# Patient Record
Sex: Female | Born: 1955
Health system: Southern US, Community
[De-identification: ages and names within clinical notes are randomized; demographics above are authoritative.]

## PROBLEM LIST (undated history)

## (undated) DIAGNOSIS — G4730 Sleep apnea, unspecified: Secondary | ICD-10-CM

## (undated) DIAGNOSIS — R42 Dizziness and giddiness: Secondary | ICD-10-CM

## (undated) DIAGNOSIS — I10 Essential (primary) hypertension: Secondary | ICD-10-CM

## (undated) DIAGNOSIS — I2510 Atherosclerotic heart disease of native coronary artery without angina pectoris: Secondary | ICD-10-CM

## (undated) DIAGNOSIS — E119 Type 2 diabetes mellitus without complications: Secondary | ICD-10-CM

## (undated) DIAGNOSIS — M1990 Unspecified osteoarthritis, unspecified site: Secondary | ICD-10-CM

## (undated) DIAGNOSIS — E669 Obesity, unspecified: Secondary | ICD-10-CM

## (undated) DIAGNOSIS — G629 Polyneuropathy, unspecified: Secondary | ICD-10-CM

## (undated) DIAGNOSIS — M797 Fibromyalgia: Secondary | ICD-10-CM

## (undated) DIAGNOSIS — E785 Hyperlipidemia, unspecified: Secondary | ICD-10-CM

## (undated) HISTORY — DX: Sleep apnea, unspecified: G47.30

## (undated) HISTORY — DX: Unspecified osteoarthritis, unspecified site: M19.90

## (undated) HISTORY — DX: Obesity, unspecified: E66.9

## (undated) HISTORY — DX: Dizziness and giddiness: R42

## (undated) HISTORY — DX: Type 2 diabetes mellitus without complications: E11.9

## (undated) HISTORY — DX: Essential (primary) hypertension: I10

## (undated) HISTORY — DX: Polyneuropathy, unspecified: G62.9

## (undated) HISTORY — DX: Hyperlipidemia, unspecified: E78.5

## (undated) HISTORY — DX: Fibromyalgia: M79.7

## (undated) HISTORY — DX: Atherosclerotic heart disease of native coronary artery without angina pectoris: I25.10

---

## 1994-10-12 HISTORY — PX: CHOLECYSTECTOMY: SHX55

## 2004-11-12 HISTORY — PX: CORONARY ANGIOPLASTY WITH STENT PLACEMENT: SHX49

## 2004-11-12 HISTORY — PX: CORONARY STENT PLACEMENT: SHX1402

## 2004-11-25 ENCOUNTER — Ambulatory Visit: Payer: Self-pay | Admitting: Internal Medicine

## 2004-12-02 ENCOUNTER — Ambulatory Visit: Payer: Self-pay

## 2004-12-04 ENCOUNTER — Ambulatory Visit: Payer: Self-pay | Admitting: Internal Medicine

## 2004-12-04 ENCOUNTER — Inpatient Hospital Stay (HOSPITAL_COMMUNITY): Admission: AD | Admit: 2004-12-04 | Discharge: 2004-12-09 | Payer: Self-pay | Admitting: Internal Medicine

## 2004-12-15 ENCOUNTER — Ambulatory Visit: Payer: Self-pay | Admitting: Internal Medicine

## 2004-12-23 ENCOUNTER — Ambulatory Visit: Payer: Self-pay | Admitting: Internal Medicine

## 2005-01-19 ENCOUNTER — Ambulatory Visit: Payer: Self-pay | Admitting: Internal Medicine

## 2005-05-07 ENCOUNTER — Ambulatory Visit: Payer: Self-pay | Admitting: Internal Medicine

## 2005-05-20 ENCOUNTER — Ambulatory Visit: Payer: Self-pay | Admitting: Internal Medicine

## 2005-10-15 ENCOUNTER — Ambulatory Visit: Payer: Self-pay | Admitting: Internal Medicine

## 2005-10-20 ENCOUNTER — Ambulatory Visit: Payer: Self-pay

## 2005-10-28 ENCOUNTER — Ambulatory Visit: Payer: Self-pay | Admitting: Pulmonary Disease

## 2005-11-13 ENCOUNTER — Ambulatory Visit (HOSPITAL_BASED_OUTPATIENT_CLINIC_OR_DEPARTMENT_OTHER): Admission: RE | Admit: 2005-11-13 | Discharge: 2005-11-13 | Payer: Self-pay | Admitting: Pulmonary Disease

## 2005-11-24 ENCOUNTER — Ambulatory Visit: Payer: Self-pay | Admitting: Pulmonary Disease

## 2005-12-31 ENCOUNTER — Encounter (HOSPITAL_COMMUNITY): Admission: RE | Admit: 2005-12-31 | Discharge: 2006-03-31 | Payer: Self-pay | Admitting: Internal Medicine

## 2006-01-12 ENCOUNTER — Ambulatory Visit: Payer: Self-pay | Admitting: Internal Medicine

## 2006-02-04 ENCOUNTER — Ambulatory Visit: Payer: Self-pay | Admitting: Pulmonary Disease

## 2006-05-04 ENCOUNTER — Ambulatory Visit: Payer: Self-pay

## 2006-05-21 ENCOUNTER — Ambulatory Visit: Payer: Self-pay | Admitting: Gastroenterology

## 2006-06-14 ENCOUNTER — Emergency Department: Payer: Self-pay | Admitting: Unknown Physician Specialty

## 2006-06-23 ENCOUNTER — Ambulatory Visit: Payer: Self-pay | Admitting: Gastroenterology

## 2006-07-13 ENCOUNTER — Ambulatory Visit: Payer: Self-pay | Admitting: Internal Medicine

## 2007-05-12 ENCOUNTER — Ambulatory Visit: Payer: Self-pay | Admitting: Internal Medicine

## 2007-12-02 ENCOUNTER — Ambulatory Visit: Payer: Self-pay | Admitting: Cardiology

## 2007-12-02 ENCOUNTER — Emergency Department (HOSPITAL_COMMUNITY): Admission: EM | Admit: 2007-12-02 | Discharge: 2007-12-02 | Payer: Self-pay | Admitting: Emergency Medicine

## 2008-05-10 ENCOUNTER — Ambulatory Visit: Payer: Self-pay | Admitting: Internal Medicine

## 2009-01-14 ENCOUNTER — Encounter: Admission: RE | Admit: 2009-01-14 | Discharge: 2009-02-19 | Payer: Self-pay

## 2009-07-31 ENCOUNTER — Ambulatory Visit: Payer: Self-pay | Admitting: Internal Medicine

## 2009-07-31 DIAGNOSIS — I10 Essential (primary) hypertension: Secondary | ICD-10-CM | POA: Insufficient documentation

## 2009-07-31 DIAGNOSIS — E785 Hyperlipidemia, unspecified: Secondary | ICD-10-CM | POA: Insufficient documentation

## 2009-07-31 DIAGNOSIS — I2510 Atherosclerotic heart disease of native coronary artery without angina pectoris: Secondary | ICD-10-CM | POA: Insufficient documentation

## 2009-09-16 ENCOUNTER — Telehealth: Payer: Self-pay | Admitting: Internal Medicine

## 2009-09-17 ENCOUNTER — Encounter: Payer: Self-pay | Admitting: Internal Medicine

## 2009-12-19 ENCOUNTER — Telehealth: Payer: Self-pay | Admitting: Internal Medicine

## 2010-07-15 ENCOUNTER — Ambulatory Visit: Payer: Self-pay | Admitting: Internal Medicine

## 2010-07-17 ENCOUNTER — Telehealth: Payer: Self-pay | Admitting: Internal Medicine

## 2010-08-05 ENCOUNTER — Telehealth: Payer: Self-pay | Admitting: Internal Medicine

## 2010-10-30 ENCOUNTER — Telehealth: Payer: Self-pay | Admitting: Internal Medicine

## 2010-11-11 NOTE — Progress Notes (Signed)
Summary: refill**NEW PHARMACY**   Phone Note Refill Request Message from:  Patient on December 19, 2009 1:38 PM  Refills Requested: Medication #1:  PLAVIX 75 MG TABS Take one tablet by mouth daily   Supply Requested: 3 months  Medication #2:  PRAVASTATIN SODIUM 40 MG TABS Take one tablet by mouth daily at bedtime   Supply Requested: 3 months [facility] Fax [phone]   Method Requested: Fax to Mail Away Pharmacy Initial call taken by: Migdalia Dk,  December 19, 2009 1:39 PM    Prescriptions: PRAVASTATIN SODIUM 40 MG TABS (PRAVASTATIN SODIUM) Take one tablet by mouth daily at bedtime  #90 x 3   Entered by:   Kem Parkinson   Authorized by:   Dolores Patty, MD, [facility]   Signed by:   Kem Parkinson on 12/19/2009   Method used:   Electronically to        [facility]* (mail-order)             ,          Ph: [phone]       Fax: [phone]   RxID:   [id number] PLAVIX 75 MG TABS (CLOPIDOGREL BISULFATE) Take one tablet by mouth daily  #90 x 3   Entered by:   Kem Parkinson   Authorized by:   Dolores Patty, MD, [facility]   Signed by:   Kem Parkinson on 12/19/2009   Method used:   Electronically to        [facility]* (mail-order)             ,          Ph: [phone]       Fax: [phone]   RxID:   [id number]

## 2010-11-11 NOTE — Progress Notes (Signed)
Summary: sample of plavix   Phone Note Call from Patient Call back at Home Phone [phone] Call back at Work Phone [phone] Call back at lunch from 1-2- p.m   Caller: Patient Reason for Call: Talk to Nurse Summary of Call: sample of plavix 75 mg. Initial call taken by: Lorne Skeens,  July 17, 2010 12:30 PM  Follow-up for Phone Call        07/17/10--1320pm--pt calling for samples of plavix--phoned pt back at work number--no answer--left message stating plavix samples at front desk to pic up--nt Follow-up by: Ledon Snare, RN,  July 17, 2010 1:21 PM

## 2010-11-11 NOTE — Progress Notes (Signed)
Summary: med question   Phone Note Call from Patient Call back at Work Phone [phone]   Caller: Patient Summary of Call: Question about medication Initial call taken by: Judie Grieve,  August 05, 2010 2:25 PM  Follow-up for Phone Call        Left message to call back Meredith Staggers, RN  August 05, 2010 3:32 PM   spoke w/pt Meredith Staggers, RN  August 06, 2010 10:37 AM

## 2010-11-11 NOTE — Progress Notes (Signed)
Summary: samples plavix   Phone Note Call from Patient Call back at Work Phone [phone]   Caller: Patient Reason for Call: Talk to Nurse Summary of Call: pt needs samples of Plavix until hers arrive through mail order Initial call taken by: Migdalia Dk,  December 19, 2009 1:40 PM  Follow-up for Phone Call        got samples called and left message saying she can come pick up samples Follow-up by: Kem Parkinson,  December 19, 2009 2:07 PM

## 2010-11-11 NOTE — Assessment & Plan Note (Signed)
Summary: f1y  Medications Added NOVOLOG MIX 70/30 70-30 % SUSP (INSULIN ASPART PROT & ASPART) as directed ZYRTEC ALLERGY 10 MG CAPS (CETIRIZINE HCL) as needed OXYCODONE-ACETAMINOPHEN 5-325 MG TABS (OXYCODONE-ACETAMINOPHEN) as needed METAXALONE 800 MG TABS (METAXALONE) as needed FISH OIL   OIL (FISH OIL) 1400mg  two times a day * BLADDERURACK 580mg  two times a day * CURBITA BLADDER CAPS three times a day * CIRCULATION BLEND once daily FLECTOR 1.3 % PTCH (DICLOFENAC EPOLAMINE) as needed      Allergies Added: NKDA  Visit Type:  Follow-up Primary Francies Inch:  Herb Grays MD   History of Present Illness: Ms. Keel is a 55 year old woman with a history of coronary artery disease, status PTCA and stenting of an LAD lesion in February 2006 with Cypher drug-eluting stent.  She had a Myoview in January 2007 for recurrent chest pain and showed an EF of 58% with normal perfusion.  The rest of the medical history is notable for morbid obesity, diabetes, hypertension, hyperlipidemia, and sleep apnea. She has been intolerant of STATINS due to myalgias.   She returns today for yearly followup. She is doing very well. Her functional capacity is limited mostly by arthritis in her back. Blood sugar control improving - following with Dr. Lisabeth Devoid. Tolerating pravastatin at 40mg  every other day also taking fish oil. No CP or signficant dyspnea.    Current Medications (verified): 1)  Plavix 75 Mg Tabs (Clopidogrel Bisulfate) .... Take One Tablet By Mouth Daily 2)  Novolog Mix 70/30 70-30 % Susp (Insulin Aspart Prot & Aspart) .... As Directed 3)  Pravastatin Sodium 40 Mg Tabs (Pravastatin Sodium) .... Take One Tablet By Mouth Daily At Bedtime 4)  Ramipril 5 Mg Caps (Ramipril) .... Take One Capsule By Mouth Daily 5)  Metoclopramide Hcl 10 Mg Tabs (Metoclopramide Hcl) .... Uad 6)  Metformin Hcl 500 Mg Tabs (Metformin Hcl) .... 2 Tablets Two Times A Day 7)  Gabapentin 300 Mg Caps (Gabapentin) .... Take  One Tablet By Mouth Once Daily. 8)  Aspirin 81 Mg Tbec (Aspirin) .... Take One Tablet By Mouth Daily 9)  Osteo Bi-Flex Joint Shield  Tabs (Misc Natural Products) .... 2 Tabs Two Times A Day 10)  Magnesium Oxide 250 Mg Tabs (Magnesium Oxide) .... Take One Tablet By Mouth Once Daily. 11)  Beta Carotene 08657 Unit Caps (Beta Carotene) .... Take One Tablet By Mouth Once Daily. 12)  Ra Calcium Plus Vitamin D 600-200 Mg-Unit Tabs (Calcium Carbonate-Vitamin D) .... Take One Tablet By Mouth Twice Daily. 13)  Biotin Maximum Strength 5000 Mcg Caps (Biotin) .... Take One Tablet By Mouth Once Daily. 14)  Zyrtec Allergy 10 Mg Caps (Cetirizine Hcl) .... As Needed 15)  Oxycodone-Acetaminophen 5-325 Mg Tabs (Oxycodone-Acetaminophen) .... As Needed 16)  Metaxalone 800 Mg Tabs (Metaxalone) .... As Needed 17)  Fish Oil   Oil (Fish Oil) .... 1400mg  Two Times A Day 18)  Bladderurack .... 580mg  Two Times A Day 19)  Curbita Bladder Caps .... Three Times A Day 20)  Circulation Blend .... Once Daily 21)  Flector 1.3 % Ptch (Diclofenac Epolamine) .... As Needed  Allergies (verified): No Known Drug Allergies  Past History:  Past Medical History: Last updated: 07/31/2009 CAD   --s/p Cypher DES to LAD 2006 HTN HL with myalgias on statins DM2 Osteoarthritis Obesity Sleep apnea  Family History: Last updated: 07/30/2009 Family History of Cancer:  Family History of Coronary Artery Disease:  Family History of Diabetes:   Social History: Last updated: 07/30/2009 Divorced  Tobacco  Use - No.  Alcohol Use - no Drug Use - no  Risk Factors: Smoking Status: never (07/30/2009)  Review of Systems       As per HPI and past medical history; otherwise all systems negative.   Vital Signs:  Patient profile:   55 year old female Height:      67 inches Weight:      242 pounds BMI:     38.04 Pulse rate:   97 / minute BP sitting:   138 / 78  (left arm) Cuff size:   large  Vitals Entered By: Hardin Negus, RMA (July 15, 2010 12:16 PM)  Physical Exam  General:  Well appearing. no resp difficulty HEENT: normal Neck: supple. no JVD. Carotids 2+ bilat; no bruits. No lymphadenopathy or thryomegaly appreciated. Cor: PMI nondisplaced. Regular rate & rhythm. No rubs, gallops, murmur. Lungs: clear Abdomen: obese. soft, nontender, nondistended. Extremities: no cyanosis, clubbing, rash. tredema Neuro: alert & orientedx3, cranial nerves grossly intact. moves all 4 extremities w/o difficulty. affect pleasant    Impression & Recommendations:  Problem # 1:  CAD, NATIVE VESSEL (ICD-414.01) Stable. No evidence of ischemia. Continue current regimen. Given DM2, will consider f/u stress test next year.    Problem # 2:  HYPERLIPIDEMIA-MIXED (ICD-272.4) Followed by Dr. Collins Scotland. Push pravastatin as tolerated. Goal LDL < 70.   Other Orders: EKG w/ Interpretation (93000)  Patient Instructions: 1)  Your physician recommends that you schedule a follow-up appointment in: 1 year 2)  Will have a Stress test when you come back next year  Prevention & Chronic Care Immunizations   Influenza vaccine: Not documented    Tetanus booster: Not documented    Pneumococcal vaccine: Not documented  Colorectal Screening   Hemoccult: Not documented    Colonoscopy: Not documented  Other Screening   Pap smear: Not documented    Mammogram: Not documented   Smoking status: never  (07/30/2009)  Lipids   Total Cholesterol: Not documented   LDL: Not documented   LDL Direct: Not documented   HDL: Not documented   Triglycerides: Not documented    SGOT (AST): Not documented   SGPT (ALT): Not documented   Alkaline phosphatase: Not documented   Total bilirubin: Not documented  Hypertension   Last Blood Pressure: 138 / 78  (07/15/2010)   Serum creatinine: Not documented   Serum potassium Not documented  Self-Management Support :    Hypertension self-management support: Not documented    Lipid  self-management support: Not documented

## 2010-11-13 NOTE — Progress Notes (Signed)
Summary: samples of plavix***pt rtn call***   Phone Note Call from Patient Call back at Work Phone [phone]   Caller: [phone] Reason for Call: Talk to Nurse Details for Reason: samples of plavix.  Initial call taken by: Lorne Skeens,  October 30, 2010 12:20 PM  Follow-up for Phone Call        sent new script into [facility] per fax that came in, called Pt LMTCB   Hardin Negus, RMA  October 31, 2010 12:06 PM   Additional Follow-up for Phone Call Additional follow up Details #1::        pt rtn your call Omer Jack  November 03, 2010 9:30 AM   spoke with pt she needed 10 pills to get her thru so i sent in a #10 script for it to [facility]  Additional Follow-up by: Hardin Negus, RMA,  November 03, 2010 9:44 AM    Prescriptions: PLAVIX 75 MG TABS (CLOPIDOGREL BISULFATE) Take one tablet by mouth daily  #10 x 1   Entered by:   Hardin Negus, RMA   Authorized by:   Dolores Patty, MD, [facility]   Signed by:   Hardin Negus, RMA on 11/03/2010   Method used:   Electronically to        [facility]* (retail)       [address] [place], [place]  [address]       Ph: [phone] or [phone]       Fax: [phone]   RxID:   [id number] PLAVIX 75 MG TABS (CLOPIDOGREL BISULFATE) Take one tablet by mouth daily  #90 x 3   Entered by:   Hardin Negus, RMA   Authorized by:   Dolores Patty, MD, [facility]   Signed by:   Hardin Negus, RMA on 10/31/2010   Method used:   Electronically to        [facility]* (mail-order)       [address] [place], [place]  [address]       Ph: [phone]       Fax: [phone]   RxID:   [id number]

## 2011-02-24 ENCOUNTER — Other Ambulatory Visit: Payer: Self-pay | Admitting: Internal Medicine

## 2011-02-24 NOTE — Assessment & Plan Note (Signed)
[facility]                            CARDIOLOGY OFFICE NOTE   NAME:Brandenberger, MATTHEW PAIS                 MRN:          [id number]  DATE:05/10/2008                            DOB:          May 30, 1956    PRIMARY CARE PHYSICIAN:  Tammy R. Collins Scotland, MD   INTERVAL HISTORY:  Ms. Scarlett is a 55 year old woman with a  history of coronary artery disease, status PTCA and stenting of an LAD  lesion in February 2006 with Cypher drug-eluting stent.  She had a  Myoview in January 2007 for recurrent chest pain and showed an EF of 58%  with normal perfusion.  The rest of the medical history is notable for  morbid obesity, diabetes, hypertension, hyperlipidemia, and sleep apnea.  She has been intolerant of STATINS due to myalgias.   She returns today for routine followup.  Overall, she is doing much  better.  She denies any chest pain or shortness of breath.  Her  functional capacity is limited mostly by arthritis in both knees and her  back.  She has been started on NSAID patches.  Dr. Collins Scotland has started her  on WelChol for her hyperlipidemia.  Given her STATIN intolerance, but  she is having trouble with this given the dosing.  Her daughter is  moving from [place] to live with her as her daughter is now 8 months  pregnant.   MEDICATIONS:  1. Plavix 75 a day.  2. Potassium 20 b.i.d., which she will stop.  3. Noni.  4. Osteo Bi-Flex.  5. Calcium  6. Vitamin D.  7. WelChol 625 three tablets b.i.d.  8. Glipizide XL 10 b.i.d.  9. Metoclopramide 10 b.i.d.  10.Ramipril 5 a day.  11.Januvia 100 a day.  12.Magnesium 250 a day.  13.Beta-carotene.  14.Biotin.   PHYSICAL EXAMINATION:  GENERAL:  She is well appearing, in no acute  distress, ambulatory in the clinic without respiratory difficulty.  VITAL SIGNS:  Blood pressure is 126/82, her weight is 246, which is  stable, and heart rate is 76.  HEENT:  Normal.  NECK:  Supple.  No JVD.  Carotids are 2+  bilaterally without bruits.  There is no lymphadenopathy or thyromegaly.  CARDIAC:  PMI is nondisplaced.  Regular rate and rhythm with no murmurs,  rubs, or gallops.  LUNGS:  Clear.  ABDOMEN:  Obese, nontender, and nondistended.  No obvious  hepatosplenomegaly.  No bruits.  No masses.  Good bowel sounds.  EXTREMITIES:  Warm with no cyanosis, clubbing, or edema.  NEURO:  Alert and oriented x3.  Cranial nerves II through XII intact.  Moves all 4 extremities without difficulty.  Affect is normal.   EKG shows sinus rhythm at rate of 76 with no ST-T wave abnormalities.   ASSESSMENT/PLAN:  1. Coronary artery disease, this is stable.  No evidence of ischemia.      Continue current therapy.  2. Hypertension.  Blood pressure is well controlled.  3. Hyperlipidemia.  Given her diabetes and coronary artery disease, I      really like to see her on a statin and she is having trouble with  the WelChol both financially and with the dosing, thus, we will try      and stop this and see if she can tolerate pravastatin 20 mg a day.      She will follow up with Dr. Collins Scotland.  4. Lower extremity edema.  She stopped her Lasix due to problems with      incontinence.  Her edema has been well controlled.  I told her that      she can just take this p.r.n.   DISPOSITION:  We will see her back in 1 year for routine followup and  she knows to call me sooner, if she is having any problems.  She will  follow up her cholesterol with Dr. Collins Scotland.     Bevelyn Buckles. Bensimhon, MD  Electronically Signed    DRB/MedQ  DD: 05/10/2008  DT: 05/11/2008  Job #: [id number]   cc:   Tammy R. Collins Scotland, M.D.

## 2011-02-24 NOTE — Consult Note (Signed)
NAMECEILIDH, TORREGROSSA          ACCOUNT NO.:  [id number]   MEDICAL RECORD NO.:  [id number]          PATIENT TYPE:  EMS   LOCATION:  [facility]                         FACILITY:  [facility]   PHYSICIAN:  Thomas C. Wall, MD, FACCDATE OF BIRTH:  05/04/1956   DATE OF CONSULTATION:  12/02/2007  DATE OF DISCHARGE:                                 CONSULTATION   REFERRING PHYSICIAN:  Dr. Celene Kras.   PRIMARY CARDIOLOGIST:  Dr. Nicholes Mango.   PRIMARY CARE:  Dr. Herb Grays.   PULMONARY:  Dr. Coralyn Helling.   REASON FOR CONSULTATION:  Betty [name] is a 55 year old Caucasian  female with known history of coronary artery disease, who presents to  [facility] Emergency Room on this date complaining of chest discomfort,  a rather poor historian.  From what I can gather with her, chest pain  has been going on for about 2 weeks.  Apparently, immediately prior to  the onset of her chest discomfort, she and her son and just moved into a  new house.  She had been lifting and moving some boxes.  She states she  does not recall any specific injuries.  She also had been lying on the  floor doing some lining in her bathroom cabinets under the sink and  could not get up off the floor secondary to her arthritis.  She  remembers her son pulling her up off the floor using her left arm, which  is where her complaint is.  She describes it as an ache over the last 2  weeks, no associated symptoms, chest pain very reproducible to  palpation; she states it was worse last night when she was getting ready  for bed, unable to lie on her left side.  She rates it a 4 on a scale of  1-10.  This morning, she was at work and had some chest discomfort again  that concerned her and she decided to come in and get evaluated  secondary to her history.   PAST MEDICAL HISTORY:  1. Coronary disease, status post catheterization with Cypher drug-      eluting stent to the LAD in 2006, also with a PTCA to the first      diagonal  at that time, followup stress Myoview in 2007 showing no      ischemia, EF had improved from 45% to 58%.  2. Morbid obesity.  3. Hypertension.  4. Diabetes.  5. Obstructive sleep apnea with noncompliance to recommended CPAP.  6. Medical noncompliance with medications, diet, exercise.  7. C-section.  8. Status post cholecystectomy.  9. Cervical disease.   ALLERGIES:  Include intolerance to LIPITOR.   MEDICATIONS:  1. Plavix 75.  2. Glucotrol XL; she takes 10 mg in the morning, 5 mg in the evening.  3. Januvia daily, unclear of dose.  4. Altace 2.5 daily.  5. Lasix; last note from Dr. Gala Romney states Lasix 40 mg b.i.d.; the      patient states she takes it once a day or as needed.  6. Aspirin 81 mg daily.  The patient states she stopped this because  it makes her bruise.  7. Multivitamin daily.  8. Reglan 5 mg q.i.d.  9. Calcium 20 mEq daily.  10.Beta carotene daily.  11.Magnesium daily,  12.Folic acid daily.  13.Glucosamine and chondroitin daily.  14.Calcium daily.  15.Vitamin D daily.   SOCIAL HISTORY:  [place].  She  is divorced, originally from [place].  No exercise.  No drugs or herbal  medications or alcohol use.  Never used any tobacco.  Tries to follow a  diabetic diet.   FAMILY HISTORY:  Mother deceased; she had a history of diabetes,  coronary artery disease and uterine cancer; she was 64.  Father is  alive; he has a history of hypertension, CVA and bypass.  Siblings all  with coronary artery disease/MIs.   REVIEW OF SYSTEMS:  Positive for headaches, chest pain, dyspnea on  exertion, lower extremity edema, myalgia, arthralgias, chronic pain and  GERD symptoms.  All other systems reviewed and negative.   PHYSICAL EXAMINATION:  VITAL SIGNS:  Temperature 98, pulse 96,  respirations 18, blood pressure initially 160/72, currently 118/70,  saturation 95% on room air.  GENERAL:  In no acute distress.  HEENT: Normal.   NECK: Supple without lymphadenopathy, no bruits, no JVD.  CARDIOVASCULAR:  Exam reveals S1 and S2, a 2/6 systolic ejection murmur  noted.  LUNGS:  Clear to auscultation bilaterally.  ABDOMEN:  Obese, soft, nontender.  Positive bowel sounds.  LOWER EXTREMITIES: Without clubbing or cyanosis.  She has trace of edema  bilaterally.  NEUROLOGICAL:  Alert and oriented x3.   LABORATORY AND ACCESSORY CLINICAL DATA:  Chest x-ray showing no acute  findings.   EKG:  Rate 96. sinus rhythm with mild T-wave inversions in inferior  leads.   H&H 13.3 and 39.3.  Sodium 136, potassium 4.2, chloride 102, BUN 10,  creatinine 0.8, glucose 232.  Point-of-cares are negative x1 set.   IMPRESSION:  1. Chest pain, reproducible to palpation.  2. History of coronary artery disease.  3. Medical noncompliance.  4. Obstructive sleep apnea with noncompliance.  5. Diabetes.  6. Hypertension.   Dr. Juanito Doom in to examine and assess patient.  The patient would like  to be discharged home, Dr. Daleen Squibb agreeable, as pain is felt to be  musculoskeletal.  The patient is recommended to use moist heat chest.  Follow up with Dr. Collins Scotland.  Celebrex 200 mg b.i.d. for 7 days and follow  up with Dr. Collins Scotland for further evaluation if needed.  Patient instructed  to take Celebrex with food on stomach.      Betty [name], ACNP      Jesse Sans. Daleen Squibb, MD, [facility]  Electronically Signed    MB/MEDQ  D:  12/02/2007  T:  12/04/2007  Job:  [id number]   cc:   Tammy R. Collins Scotland, M.D.

## 2011-02-24 NOTE — Assessment & Plan Note (Signed)
[facility]                            CARDIOLOGY OFFICE NOTE   NAME:Betty [name], Betty [name]                 MRN:          [id number]  DATE:05/12/2007                            DOB:          1955/11/09    PRIMARY CARE PHYSICIAN:  Dr. Herb Grays.   INTERVAL HISTORY:  Betty [name] is a 55 year old woman with a  history of coronary artery disease status post PCA and stenting of an  LAD lesion in February of 2006, with a CYPHER drug-eluting stent.  She  did have a Myoview in January of 2007 for recurrent chest pain which  showed an EF of 58% and normal perfusion.  The rest of her medical  history is notable for morbid obesity, diabetes, hypertension,  hyperlipidemia and sleep apnea.  She is intolerant of statins due to  severe leg pains and refuses to take them.   She returns today for routine followup.  Overall, she is doing much  better, she denies any chest pain or shortness of breath; however, her  functional capacity is limited by arthritis pain in both knees.  She has  been following with Dr. Collins Scotland and been very comfortable with her.  She  does have occasional edema, which she treats with Lasix.   PHYSICAL EXAM:  She is well-appearing, in no acute distress.  Ambulates  around the clinic without any respiratory difficulty.  Blood pressure is  126/72, heart rate is 88, weight is 249.  HEENT:  Normal.  NECK:  Supple.  No JVD.  Carotids are 2+ bilaterally without any bruits.  There is no lymphadenopathy or thyromegaly.  CARDIAC:  Regular rate and rhythm with a 2/6 systolic ejection murmur at  the left sternal border, no rubs or gallops.  LUNGS:  Clear.  ABDOMEN:  Obese, nontender, nondistended.  There is no  hepatosplenomegaly, no bruits, no masses, good bowel sounds.  EXTREMITIES:  Warm with no cyanosis, clubbing or edema, no rash.  Edema  bilaterally 2+.  NEURO:  She is alert and oriented x3.  Cranial nerves II-XII are intact.  She moves all  4 extremities without difficulty.  Affect is appropriate.   EKG shows normal sinus rhythm at a rate of 88, no significant ST-T wave  abnormalities.   ASSESSMENT:  1. Coronary artery disease; this is stable, no evidence of ischemia.      Continue Plavix lifelong.  2. Hypertension, at goal.  3. Hyperlipidemia, this is followed by Dr. Collins Scotland; I encouraged her to      try another statin, given her coronary artery disease, she      completely refuses this.  Thus, we will consider Zetia to try to      help and get her LDL below 70.  I will leave this to Dr. Collins Scotland.  4. Lower extremity edema.  She can be managed with Lasix.  She does      have mild volume overload today and she attributes this to missing      her Lasix this morning.  She will take her night time dose.   DISPOSITION:  Will see her back in clinic in  9 months for routine  followup.     Bevelyn Buckles. Bensimhon, MD  Electronically Signed    DRB/MedQ  DD: 05/12/2007  DT: 05/13/2007  Job #: [id number]   cc:   Tammy R. Collins Scotland, M.D.

## 2011-02-27 NOTE — Assessment & Plan Note (Signed)
[facility]                           GASTROENTEROLOGY OFFICE NOTE   NAME:Betty, [name]                 MRN:          [id number]  DATE:05/21/2006                            DOB:          30-Apr-1956    HISTORY:  [name] is a 55 year old white female who is referred by  Dr. Bayard Beaver. Spear for a screening colonoscopy.  This patient has had some  painful hemorrhoidal swelling but denies rectal bleeding or significant  abdominal pain.  She does have periodic constipation.  She denies any upper  GI or upper biliary complaints.  Her appetite is good and she is actually  gaining weight steadily, having gained 30 pounds in the last several months.  She denies melena or hematochezia.  She has never had a colonoscopy or a  barium enema exams.  She denies food intolerances and specifically any  dysphagia, dyspepsia, etc.   PAST MEDICAL/SURGICAL HISTORY:  1. Remarkable for morbid obesity.  2. Adult onset diabetes.  3. Coronary artery disease with previous angioplasty and stenting.  4. Cervical disease at C5-C6.  5. She is status post cholecystectomy for cholelithiasis.  6. Chronic thyroid dysfunction.  7. Degenerative arthritis.  8. Sleep apnea.  9. Chronic sinusitis.   MEDICATIONS:  1. Plavix 75 mg daily.  2. Potassium 20 mEq b.i.d.  3. Altace 2.5 mg. q.o.d.  4. Lasix 40 mg b.i.d.  5. Aspirin 81 mg daily.  6. Glucotrol 5 mg daily.  7. Lipitor 40 mg q.o.d.  8. __________ 400 mg daily.  9. A variety of vitamins and minerals including folic acid.  10.Nitroglycerin p.r.n.   ALLERGIES:  She denies drug allergies.   FAMILY HISTORY:  Remarkable for diabetes and atherosclerosis in multiple  family members.  She has a grandmother with breast cancer and her mother had  uterine cancer.  Her father suffered from chronic polyposis and is age 56.   SOCIAL HISTORY:  The patient is divorced and lives alone.  She has a high  school education.   She used to work as a Librarian, academic.  She does not  smoke or abuse ethanol.   REVIEW OF SYSTEMS:  Remarkable for multiarticular arthralgias, chronic  insomnia, excessive urination, painful menses, chronic fatigue and some  urinary incontinency.  Her last menstrual period was on May 18, 2006.  She  denies current active cardiovascular, pulmonary, urologic or psychiatric  difficulties.   PHYSICAL EXAMINATION:  GENERAL:  A morbidly obese white female, in no acute  distress.  She is 5 feet 7 inches tall.  She weighs 257.4 pounds.  I could  not appreciate stigmata of chronic liver disease.  VITAL SIGNS:  Blood pressure 134/68, pulse 84 and regular.  NECK:  I could not appreciate thyromegaly.  CHEST:  Generally clear without wheezes or rhonchi.  HEART:  She appeared to be in a regular rhythm and I could not appreciate  significant murmurs, gallops or rubs.  ABDOMEN:  Difficult because of obesity but I could appreciate no definite  organomegaly, masses or tenderness.  EXTREMITIES:  Peripheral extremities showed no significant edema or  phlebitis.  NEUROLOGIC:  Mental  status was clear.  RECTAL:  Deferred.   ASSESSMENT/RECOMMENDATIONS:  We will schedule [name] for a  screening colonoscopy at her convenience with adjustment in her diabetic  medications.  Because of her stents and cardiovascular history, she will  need to continue her Plavix and aspirin during this procedure.  The risks  and benefits of this procedure with possible polypectomy, including bleeding  and perforation, have been explained in detail and she has agreed to proceed  as planned.  She is to continue her other medications as per Dr. Collins Scotland.   ADDENDUM:  The patient did have a recent urinalysis and exam, also which  were unremarkable.  Recent laboratory tests per Dr. Collins Scotland have shown a  normal metabolic profile including liver function tests and a hemoglobin A1c  of 6.1% on April 03, 2006.  The TSH was also  normal.                                   Betty [name]. Jarold Motto, MD, [facility] [place], [place]   DRP/MedQ  DD:  05/21/2006  DT:  05/21/2006  Job #:  [id number]   cc:   Bevelyn Buckles. Bensimhon, MD  Tammy R. Collins Scotland, MD

## 2011-02-27 NOTE — Cardiovascular Report (Signed)
NAMETEEA, DUCEY NO.:  [id number]   MEDICAL RECORD NO.:  [id number]          PATIENT TYPE:  INP   LOCATION:  4742                         FACILITY:  [facility]   PHYSICIAN:  Arvilla Meres, M.D. LHCDATE OF BIRTH:  [birth year]   DATE OF PROCEDURE:  12/05/2004  DATE OF DISCHARGE:                              CARDIAC CATHETERIZATION   PRIMARY CARE PHYSICIAN:  Titus Dubin. Alwyn Ren, M.D. [facility]   CARDIOLOGIST:  Arvilla Meres, M.D. [facility]   HISTORY OF PRESENT ILLNESS:  Betty [name] is a 55 year old woman with a  history of obesity and a 15 year history of diabetes but no known coronary  disease, who is referred for a cardiac catheterization due to a 6 month  history of chest pain and dyspnea on exertion and an abnormal functional  study.   PROCEDURE:  1.  Selective coronary angiography.  2.  Left heart catheterization.  3.  Left ventriculogram.   DESCRIPTION OF PROCEDURE:  The risks and benefits of the procedure were  reviewed with [name]. Consent was signed and placed on the chart.  The right groin area was prepped and draped in routine sterile fashion. The  area was then anesthetized with 1% local Lidocaine. A 6 French arterial  sheath was placed in the right femoral artery using the modified Seldinger  technique. Standard catheters were used throughout the procedure including 6  [place] JL4, JR4, and bent pigtail. All catheter exchanges were made over  wire. There were no apparent complications. At the end of the procedure, the  patient was given 3,000 units of intravenous heparin and the sheath was  sutured in for possible angioplasty. She was then transferred to the holding  area in stable condition.   FINDINGS:  Central aortic pressure is 122/72 with a mean of 91.  Left ventricular pressure was 124/10. There was no gradient on aortic valve  pull back.  The left ventricular EDP was 17.  Left main was okay.  Left anterior descending:  Was a long  vessel that wrapped the apex. There is  a 99% proximal lesion at the bifurcation of D1.  Left circumflex:  Was made up of tiny OM1 and predominantly was a large OM2.  There is no angiographic CAD.  Right coronary artery:  There was some ostial catheter-induced spasm. The  vessel was dominant and gave off a small PDA and 3 small PL's. Thee was no  significant CAD.  Left ventriculogram done in the RAO projection showed an EF of approximately  45% with global hypokinesis and 1+ mitral regurgitation.   ASSESSMENT/PLAN:  1.  A high grade left anterior descending lesion (bifurcation). Will review      with Dr. Juanda Chance, our interventional physician today, for possible PCI.  2.  Mildly reduced left ventricular systolic function.      DB/MEDQ  D:  12/05/2004  T:  12/06/2004  Job:  [id number]   cc:   Titus Dubin. Alwyn Ren, M.D. [facility]

## 2011-02-27 NOTE — Procedures (Signed)
Betty [name], Betty [name]          ACCOUNT NO.:  [id number]   MEDICAL RECORD NO.:  [id number]          PATIENT TYPE:  OUT   LOCATION:  SLEEP CENTER                 [facility]ACILITY[facility]  [facility]   PHYSICIAN:  Coralyn Helling, M.D.      DATE OF BIRTH:  May 08, 1956   DATE OF STUDY:  11/13/2005                              NOCTURNAL POLYSOMNOGRAM   REFERRING PHYSICIAN:  Dr. Coralyn Helling   INDICATION FOR STUDY:  This is a patient with a history of coronary artery  disease, diabetes, and excessive daytime sleepiness, referred to the sleep  laboratory for evaluation of sleep-disordered breathing.   EPWORTH SLEEPINESS SCORE:  18/24   MEDICATIONS:  Avandamet, potassium, Lasix, Plavix, aspirin, and Lipitor.   SLEEP ARCHITECTURE:  Total recording time was 407.5 minutes. Total sleep  time was 362.0 minutes. Sleep efficiency was 89%. The patient was observed  in all stages of sleep. Sleep latency was 9 minutes, which is normal. REM  latency is 98 minutes, which is normal. The patient slept predominantly in  the nonsupine position.   RESPIRATORY DATA:  The apnea/hypopnea index was 24.7. The respiratory events  were exclusively obstructive in nature. Moderate snoring was noted by the  technician. Of note is that the apnea/hypopnea index during non-REM sleep  was 4.4; however, during REM sleep the apnea/hypopnea index was 64.9.   OXYGEN DATA:  The oxygen saturation nadir was 72%. The patient spent 374.2  minutes with a oxygen saturation between 91% and 100%, and 11.2 minutes with  an oxygen saturation between 81% and 90%, and only had one transient episode  of oxygen saturation below 81%.   CARDIAC DATA:  EKG showed normal sinus rhythm with occasional PACs.   MOVEMENT/PARASOMNIA:  The periodic limb movement index was 11.1, which is  normal.   IMPRESSIONS/RECOMMENDATIONS:  This study shows evidence for moderate  obstructive sleep apnea as demonstrated by an apnea/hypopnea index of 21.7  with an oxygen  saturation nadir of 72%. Of note is that the patient had very  little supine sleep and therefore this may underestimate the severity of her  sleep apnea. Also of note is that she had a predominance of her events  during related to REM sleep with a REM apnea/hypopnea index of 64.9. At this  point, the patient should be counseled with regard to the importance of  diet, exercise, and weight reduction, as well as the avoidance of alcohol  and sedatives. Driving precautions should be discussed with the patient  until her sleep apnea is under adequate control. Further therapy options  should be discussed with her  including CPAP therapy, oral appliance, and various surgical interventions.  If the patient is agreeable for CPAP therapy she should be referred back to  the sleep laboratory for a CPAP titration study.      Coralyn Helling, M.D.  Diplomat, Biomedical engineer of Sleep Medicine  Electronically Signed     VS/MEDQ  D:  11/24/2005 14:34:12  T:  11/25/2005 08:55:41  Job:  [id number]

## 2011-02-27 NOTE — Cardiovascular Report (Signed)
NAMELEXYS, MILLINER NO.:  [id number]   MEDICAL RECORD NO.:  [id number]          PATIENT TYPE:  INP   LOCATION:                               FACILITY:  [facility]   PHYSICIAN:  Charlies Constable, M.D. [facility] DATE OF BIRTH:  January 02, 1956   DATE OF PROCEDURE:  12/08/2004  DATE OF DISCHARGE:                              CARDIAC CATHETERIZATION   PERCUTANEOUS CORONARY INTERVENTION:   CLINICAL HISTORY:  Betty [name] is 55 years old and has no prior history  of known heart disease, but has been having symptoms of chest pain for a  number of weeks, and recently had a stress test which was positive when seen  in consultation by Dr. Nicholes Mango from Dr. Alwyn Ren,  and her symptoms were  felt to be unstable.  She was admitted with a diagnosis of unstable angina.  He performed a diagnostic procedure on Friday, which showed a tight  bifurcation lesion in the LAD diagonal branch and she was scheduled for  intervention today.   PROCEDURE:  The procedure was performed through the right femoral artery  using an arterial sheath and 6 French preformed coronary catheters.  A front  wall arterial puncture was performed and Omnipaque contrast was used.  The  patient was given Angiomax bolus and infusion.  She had been started on  Plavix on Friday and was given an extra 300 mg load today.   We used a 7 [place] system because of the bifurcation lesion.  We chose a 7  J[place] CL 3.5 guiding catheter with side holes and using a Clinical cytogeneticist  wire down the LAD and a Asahi soft wire down the diagonal branch.  We first  dilated the LAD with a 2.5 x 15 millimeter Maverick performing two  inflations up to 8 atmospheres for 30 seconds.  We then used a 2.25 x 10  millimeter cutting balloon and dilated the ostial lesion in the diagonal  branch performing three inflations up to 6 atmospheres for 20 seconds. We  then stented the LAD across the diagonal branch using a 3.0 x 18 millimeter  CYPHER stent  and deploying this with one inflation of 16 atmospheres for 30  seconds.  We had removed the wire from the diagonal branch before deploying  the stent.  We then post dilated the stent with a 3.25 x 15 millimeter  Quantum Maverick performing one inflation up to 15 atmospheres for 30  seconds.  Fortunately, stenting of the LAD did not significantly compromise  the diagonal branch.  The patient tolerated the procedure well and left  laboratory is satisfactory condition.  The right femoral artery was not  suitable for a closure due to a stick at the bifurcation.   RESULTS:  Initially the stenosis in the LAD at the bifurcation from the  diagonal branch was 95%.  Following stenting this improved to 0%.  The  stenosis in the __________ diagonal branch was initially 90% and following  cutting balloon angioplasty, improved to less than 10%, and was 50% after  the stent deployment.   CONCLUSION:  Successful percutaneous coronary intervention of a bifurcation  lesion  of the left anterior descending and diagonal branch with improvement  in the left anterior descending lesion from 95% to 0% using a  CYPHER drug-eluting stent, and improvement in the diagonal branch from 90%  to 50% with cutting balloon angioplasty.   DISPOSITION:  The patient was returned to his room for further observation.       ___________________________________________  Charlies Constable, M.D. [facility]    BB/MEDQ  D:  12/08/2004  T:  12/08/2004  Job:  [id number]   cc:   Titus Dubin. Alwyn Ren, M.D. [facility]   Arvilla Meres, M.D. [facility]

## 2011-02-27 NOTE — Assessment & Plan Note (Signed)
[facility]                              CARDIOLOGY OFFICE NOTE   NAME:[name], Fornes                 MRN:          [id number]  DATE:07/13/2006                            DOB:          1955/11/10    PRIMARY CARE PHYSICIAN:  Dr. Herb Grays.   PROBLEM LIST:  1. Coronary artery disease.      a.     Cardiac catheterization, February 2006, for unstable angina, 99%       LAD, stented with 3.0 x 18 mm Cypher stent.      b.     Adenosine Myoview, January 2007.  Ejection fraction 58% and       normal perfusion.  2. Morbid obesity.  3. Diabetes.  4. Hypertension.  5. Hyperlipidemia.      a.     Most recent LDL was 51.  6. Sleep apnea followed by Dr. Craige Cotta.  7. Osteoarthritis.  8. Medication noncompliance.  9. Lower extremity edema.   CURRENT MEDICATIONS:  1. Plavix 75.  2. Potassium 20 b.i.d.  3. Lasix 40 b.i.d.  4. Aspirin 81.  5. Glucotrol 5.  6. Lipitor 40 every other day.  7. Multiple vitamins.  8. Altace 2.5 a day.   She has no known drug allergies.   INTERVAL HISTORY:  [name] presents today for a followup.  Overall,  she is doing quite well.  About a week ago, she developed the flu and was  taking a lot of Sudafed.  Over the past few days, she has noticed a lot of  burning in her stomach and chest with a lot of belching.  She has taken an  occasional TUMS without significant relief.  She is also complaining of some  shoulder pain which is nonexertional.  She says her abdominal and chest pain  is worse when lying down.  There is no exertional component.  Denies any  heart failure symptoms.   PHYSICAL EXAMINATION:  She is in no acute distress.  Ambulates around the  clinic without difficulty.  Blood pressure is 108/70.  Heart rate is 77.  Weight is 254.  HEENT:  Sclerae anicteric.  EOMI.  There is no xanthelasma.  Mucous  membranes are moist.  NECK:  Supple.  No JVD.  Carotids are 2+ bilaterally without any bruits.  There is no lymphadenopathy or thyromegaly.  CARDIAC:  Regular rate and rhythm with a 2/6 systolic ejection murmur at the  left sternal border.  No rubs or gallops.  LUNGS:  Clear.  ABDOMEN:  Obese, mildly tender in the epigastrium and nondistended.  No  rebound.  No guarding.  No Murphy's sign.  EXTREMITIES:  Warm with no cyanosis, clubbing, or edema.  NEUROLOGIC:  She is alert and oriented x3.  Cranial nerves II through XII  are intact.  She moves all 4 extremities without difficulty.   EKG showed normal sinus rhythm at a rate of 77 with no ST-T wave changes.   ASSESSMENT AND PLAN:  1. Chest/abdominal pain.  I suspect that this is reflux disease.  I have      told her to  continue the TUMS and we have started her on Protonix 40 a      day.  I do not think this is consistent with angina.  If this      continues, she will need a repeat followup with Gastrointestinal.  2. Coronary artery disease.  This is stable.  Continue current regimen.  3. Hypertension, at goal.  4. Hyperlipidemia, at goal, followed by Dr. Collins Scotland.  5. Sleep apnea.  Followup with Dr. Craige Cotta.   DISPOSITION:  Return to clinic in 9 months for a routine followup.       Bevelyn Buckles. Bensimhon, MD     DRB/MedQ  DD:  07/13/2006  DT:  07/14/2006  Job #:  [id number]

## 2011-02-27 NOTE — Discharge Summary (Signed)
Betty [name], Betty [name]          ACCOUNT NO.:  [id number]   MEDICAL RECORD NO.:  [id number]          PATIENT TYPE:  INP   LOCATION:  6533                         FACILITY:  [facility]   PHYSICIAN:  Arvilla Meres, M.D. LHCDATE OF BIRTH:  1955-11-14   DATE OF ADMISSION:  12/04/2004  DATE OF DISCHARGE:  12/09/2004                           DISCHARGE SUMMARY - REFERRING   PROCEDURE:  Coronary angiogram/Cypher stenting LAD and PTCA diagonal,  December 08, 2004.   REASON FOR ADMISSION:  Betty [name] is a 55 year old female, with no  prior history of coronary artery disease, but with multiple cardiac risk  factors, who recently presented to Dr. Gala Romney in the office in followup  of an abnormal stress Myoview.  Please refer to dictated admission note for  full details.   LABORATORY DATA:  Serial cardiac markers normal.  TSH 1.26.  Lipid profile:  Total cholesterol 198, triglycerides 392, HDL 32, LDL 88.  Potassium 3.7,  BUN 9, creatinine 0.6 at discharge.  Hematocrit 35, platelets 247 at  discharge.   ADMISSION CHEST X-RAY:  No acute disease.   HOSPITAL COURSE:  The patient was admitted by Dr. Gala Romney for further  management and evaluation of symptoms worrisome for unstable angina pectoris  in light of recent abnormal stress Myoview.  She was started on aspirin,  intravenous heparin, and subsequent addition of Toprol and Lipitor.   Serial cardiac markers were all within normal limits.   The patient remained stable over the weekend, and underwent diagnostic  cardiac catheterization on Monday, by Dr. Gala Romney (see report for full  details), revealing high-grade lesion at the LAD/diagonal bifurcation.  Residual anatomy revealed no critical stenoses.  LV-gram:  EF approximately  45%, with global hypokinesis and 1+ mitral regurgitation.   Following review of films with Dr. Juanda Chance, recommendation was to have the  patient return for percutaneous intervention.  This was done by Dr.  Juanda Chance  the following day, with successful placement of a Cypher stent for treatment  of the 95% LAD lesion at the bifurcation, and PTCA dilatation of the 90%  diagonal lesion.  There were no noted complications.  The patient was  cleared for discharge the following morning in hemodynamically stable  condition.  Right groin was benign, with no evidence of hematoma or bruit.   The patient will need to remain on Plavix for one year.   Dr. Gala Romney had recommended the addition of ACE inhibitor following  catheterization.  However, given her low normal blood pressure (108/50) at  the time of discharge, this will be deferred until time of scheduled  followup.   At discharge, the patient will be referred to care management for medication  assistance.  She will also be referred for nutritional teaching.   MEDICATIONS AT DISCHARGE:  1.  Plavix 75 mg daily.  2.  Coated aspirin 325 mg daily.  3.  Toprol XL 50 mg daily.  4.  Lipitor 40 mg daily.  5.  Avandia 8 mg daily.  6.  Amaryl 4 mg daily.  7.  Glucophage 1,000 mg b.i.d.  8.  Nitrostat 0.4 mg as directed.   INSTRUCTIONS:  1.  Resume Glucophage in 48 hours (Wednesday, December 10, 2004).  2.  No heavy lifting/driving x 2 days.  3.  Maintain lowfat/low cholesterol and diabetic diet.  4.  Call the office if there is any swelling/bleeding in the groin.   Arrangements will be made for the patient to follow up with Dr. Arvilla Meres in two weeks.   DISCHARGE DIAGNOSES:  1.  Unstable angina pectoris.      1.  Normal serial cardiac markers.      2.  Status post Cypher stenting left anterior descending/percutaneous          transluminal coronary angioplasty diagonal at bifurcation, December 08, 2004.      3.  Mild left ventricular dysfunction (ejection fraction 45%).  2.  Hypotension.  3.  Type 2 diabetes mellitus.  4.  Dyslipidemia.  5.  Obesity.      GS/MEDQ  D:  12/09/2004  T:  12/09/2004  Job:  [id number]   cc:    Titus Dubin. Alwyn Ren, M.D. [facility]

## 2011-07-03 LAB — I-STAT 8, (EC8 V) (CONVERTED LAB)
Acid-Base Excess: 1
TCO2: 29
pCO2, Ven: 47.3
pH, Ven: 7.37 — ABNORMAL HIGH

## 2011-07-03 LAB — POCT CARDIAC MARKERS
CKMB, poc: <1 — ABNORMAL LOW
Myoglobin, poc: 31
Operator id: 161631
Troponin i, poc: <0.05

## 2011-07-30 ENCOUNTER — Telehealth: Payer: Self-pay | Admitting: Internal Medicine

## 2011-07-30 NOTE — Telephone Encounter (Signed)
Pt wanted to schedule appt with Dr. Gala Romney and she was told last year she needs some testing prior to appt and she was concerned because she has not heard anything yet

## 2011-07-30 NOTE — Telephone Encounter (Signed)
Pt is calling to request an appt with Dr Gala Romney for a yearly f/u.  She was told that Dr Gala Romney is no longer going to be seeing pts at this clinic and that she would need to establish with a new cardiologist.   She will be called by one of the schedulers with a new appt.

## 2011-08-19 ENCOUNTER — Encounter: Payer: Self-pay | Admitting: *Deleted

## 2011-08-20 ENCOUNTER — Ambulatory Visit (INDEPENDENT_AMBULATORY_CARE_PROVIDER_SITE_OTHER): Payer: Federal, State, Local not specified - PPO | Admitting: Cardiovascular Disease

## 2011-08-20 ENCOUNTER — Encounter: Payer: Self-pay | Admitting: Cardiovascular Disease

## 2011-08-20 DIAGNOSIS — I10 Essential (primary) hypertension: Secondary | ICD-10-CM

## 2011-08-20 DIAGNOSIS — I2510 Atherosclerotic heart disease of native coronary artery without angina pectoris: Secondary | ICD-10-CM

## 2011-08-20 DIAGNOSIS — E785 Hyperlipidemia, unspecified: Secondary | ICD-10-CM

## 2011-08-20 NOTE — Assessment & Plan Note (Signed)
Well controlled.  Continue current medications and low sodium Dash type diet.    

## 2011-08-20 NOTE — Progress Notes (Signed)
Betty [name] is a 55 year old patient of Dr Teressa Lower  with a history of coronary artery disease, status PTCA and stenting of an LAD lesion in February 2006 with Cypher drug-eluting stent. She had a Myoview in January 2007 for recurrent chest pain and showed an EF of 58% with normal perfusion. The rest of the medical history is notable for morbid obesity, diabetes, hypertension, hyperlipidemia, and sleep apnea.  She has been intolerant of STATINS due to myalgias.  She returns today for yearly followup. She is doing very well. Her functional capacity is limited mostly by arthritis in her back. Blood sugar control improving - following with Dr. Lisabeth Devoid. Tolerating pravastatin at 40mg  every other day also taking fish oil. No CP or signficant dyspnea.   Not strict with low carb diet.  No SSCP.     ROS: Denies fever, malais, weight loss, blurry vision, decreased visual acuity, cough, sputum, SOB, hemoptysis, pleuritic pain, palpitaitons, heartburn, abdominal pain, melena, lower extremity edema, claudication, or rash.  All other systems reviewed and negative  General: Affect appropriate Healthy:  appears stated age HEENT: normal Neck supple with no adenopathy JVP normal no bruits no thyromegaly Lungs clear with no wheezing and good diaphragmatic motion Heart:  S1/S2 no murmur,rub, gallop or click PMI normal Abdomen: benighn, BS positve, no tenderness, no AAA no bruit.  No HSM or HJR Distal pulses intact with no bruits No edema Neuro non-focal Skin warm and dry No muscular weakness   Current Outpatient Prescriptions  Medication Sig Dispense Refill  . aspirin 81 MG tablet Take 81 mg by mouth daily.        . beta carotene 47829 UNIT capsule Take 25,000 Units by mouth daily.        . Biotin 5000 MCG TABS Take 1 tablet by mouth daily.        . Calcium Carbonate-Vitamin D (CALCIUM-VITAMIN D) 600-200 MG-UNIT CAPS Take by mouth.        . cetirizine (ZYRTEC) 10 MG tablet Take 10 mg by mouth  daily.        . clopidogrel (PLAVIX) 75 MG tablet Take 75 mg by mouth daily.        Marland Kitchen gabapentin (NEURONTIN) 300 MG capsule Take 300 mg by mouth daily.       . insulin aspart (NOVOLOG) 100 UNIT/ML injection Inject into the skin as directed.        . Magnesium 250 MG TABS Take 1 tablet by mouth daily.        . metaxalone (SKELAXIN) 800 MG tablet Take 800 mg by mouth as needed.       . metFORMIN (GLUCOPHAGE) 500 MG tablet Take 500 mg by mouth daily with breakfast.       . Misc Natural Products (OSTEO BI-FLEX JOINT SHIELD PO) Take 1 tablet by mouth daily.        . NON FORMULARY Flector 1.3 % Ptch (Diclofenac Epolamine) .... As Needed       . Omega-3 Fatty Acids (FISH OIL CONCENTRATE PO) Take 1 tablet by mouth daily.        Marland Kitchen oxyCODONE-acetaminophen (PERCOCET) 5-325 MG per tablet Take 1 tablet by mouth every 4 (four) hours as needed.        . pravastatin (PRAVACHOL) 40 MG tablet TAKE 1 TABLET BY MOUTH AT BEDTIME  30 tablet  6  . ramipril (ALTACE) 5 MG tablet Take 5 mg by mouth daily.          Allergies  Review of patient's  allergies indicates no known allergies.  Electrocardiogram:  NSR 91 T wave inversion lead 3  Assessment and Plan

## 2011-08-20 NOTE — Patient Instructions (Signed)
Your physician wants you to follow-up in: 12 months You will receive a reminder letter in the mail two months in advance. If you don't receive a letter, please call our office to schedule the follow-up appointment.  STOP PLAVIX

## 2011-08-20 NOTE — Assessment & Plan Note (Signed)
Cholesterol is at goal.  Continue current dose of statin and diet Rx.  No myalgias or side effects.  F/U  LFT's in 6 months. No results found for this basename: [facility]   Labs with Dr Collins Scotland

## 2011-08-20 NOTE — Assessment & Plan Note (Signed)
Stable with no angina and good activity level.  Continue medical Rx Stop Plavix  Stent over 5 years ago and no history of restenosis

## 2011-08-21 ENCOUNTER — Ambulatory Visit: Payer: Self-pay | Admitting: Cardiovascular Disease

## 2011-09-01 NOTE — Progress Notes (Signed)
Addended by: Micki Riley C on: 09/01/2011 02:14 PM   Modules accepted: Orders

## 2011-12-07 ENCOUNTER — Other Ambulatory Visit (HOSPITAL_COMMUNITY): Payer: Self-pay | Admitting: Internal Medicine

## 2012-01-29 ENCOUNTER — Telehealth: Payer: Self-pay | Admitting: Cardiovascular Disease

## 2012-01-29 NOTE — Telephone Encounter (Signed)
Patient states since Wednesday, she has been having left shoulder and left breast pain and back, the pain is dull  But sometime is sharp, score "6 to 7". Today the pain is constant  and is on her left ribcage also,  Patient denies  Nausea or vomit, nor cold sweats or SOB, but said  That the  pain is worse   when taken deep breaths.  Dr. Eden Emms aware, he said that the pain  is muscular. MD recommended for patient to call her PCP to be seen. Pt is aware , also she will call the office back if needed.

## 2012-01-29 NOTE — Telephone Encounter (Signed)
New msg Pt said she has been having shoulder pain, stomach pain back pain

## 2012-07-07 ENCOUNTER — Other Ambulatory Visit: Payer: Self-pay | Admitting: *Deleted

## 2012-07-07 MED ORDER — PRAVASTATIN SODIUM 40 MG PO TABS: 40.0000 mg | ORAL_TABLET | Freq: Every day | ORAL | Status: DC

## 2012-07-07 NOTE — Telephone Encounter (Signed)
Refilled pravastatin. 

## 2012-09-19 DIAGNOSIS — M1990 Unspecified osteoarthritis, unspecified site: Secondary | ICD-10-CM | POA: Insufficient documentation

## 2012-09-19 DIAGNOSIS — E669 Obesity, unspecified: Secondary | ICD-10-CM | POA: Insufficient documentation

## 2012-09-19 DIAGNOSIS — G4730 Sleep apnea, unspecified: Secondary | ICD-10-CM | POA: Insufficient documentation

## 2012-09-21 ENCOUNTER — Ambulatory Visit (INDEPENDENT_AMBULATORY_CARE_PROVIDER_SITE_OTHER): Payer: Federal, State, Local not specified - PPO | Admitting: Cardiovascular Disease

## 2012-09-21 ENCOUNTER — Encounter: Payer: Self-pay | Admitting: Cardiovascular Disease

## 2012-09-21 VITALS — BP 150/78 | HR 76 | Resp 16 | Ht 67.0 in | Wt 285.0 lb

## 2012-09-21 DIAGNOSIS — E785 Hyperlipidemia, unspecified: Secondary | ICD-10-CM

## 2012-09-21 DIAGNOSIS — E119 Type 2 diabetes mellitus without complications: Secondary | ICD-10-CM

## 2012-09-21 DIAGNOSIS — I2510 Atherosclerotic heart disease of native coronary artery without angina pectoris: Secondary | ICD-10-CM

## 2012-09-21 DIAGNOSIS — E669 Obesity, unspecified: Secondary | ICD-10-CM

## 2012-09-21 DIAGNOSIS — I10 Essential (primary) hypertension: Secondary | ICD-10-CM

## 2012-09-21 NOTE — Assessment & Plan Note (Signed)
Cholesterol is at goal.  Continue current dose of statin and diet Rx.  No myalgias or side effects.  F/U  LFT's in 6 months. No results found for this basename: [facility]             

## 2012-09-21 NOTE — Assessment & Plan Note (Signed)
Well controlled.  Continue current medications and low sodium Dash type diet.    

## 2012-09-21 NOTE — Progress Notes (Signed)
Patient ID: Betty [name], female   DOB: 1956-07-19, 56 y.o.   MRN: [id number] Betty [name] is a 56 year old patient of Betty [name] with a history of coronary artery disease, status PTCA and stenting of an LAD lesion in February 2006 with Cypher drug-eluting stent. She had a Myoview in January 2007 for recurrent chest pain and showed an EF of 58% with normal perfusion. The rest of the medical history is notable for morbid obesity, diabetes, hypertension, hyperlipidemia, and sleep apnea.  She has been intolerant of STATINS due to myalgias.   She returns today for yearly followup. She is doing very well. Her functional capacity is limited mostly by arthritis in her back. Blood sugar control improving - following with Betty [name]. Tolerating pravastatin at 40mg  every other day also taking fish oil. No CP or signficant dyspnea.  Not strict with low carb diet. No SSCP.   ROS: Denies fever, malais, weight loss, blurry vision, decreased visual acuity, cough, sputum, SOB, hemoptysis, pleuritic pain, palpitaitons, heartburn, abdominal pain, melena, [name] extremity edema, claudication, or rash.  All other systems reviewed and negative  General: Affect appropriate Healthy:  appears stated age HEENT: normal Neck supple with no adenopathy JVP normal no bruits no thyromegaly Lungs clear with no wheezing and good diaphragmatic motion Heart:  S1/S2 no murmur, no rub, gallop or click PMI normal Abdomen: benighn, BS positve, no tenderness, no AAA no bruit.  No HSM or HJR Distal pulses intact with no bruits No edema Neuro non-focal Skin warm and dry No muscular weakness   Current Outpatient Prescriptions  Medication Sig Dispense Refill  . aspirin 81 MG tablet Take 81 mg by mouth daily.        . beta carotene 40981 UNIT capsule Take 25,000 Units by mouth daily.        . Biotin 5000 MCG TABS Take 1 tablet by mouth daily.       . Calcium Carbonate-Vitamin D (CALCIUM-VITAMIN D) 600-200 MG-UNIT CAPS  Take by mouth.        . cetirizine (ZYRTEC) 10 MG tablet Take 10 mg by mouth daily.        Marland Kitchen gabapentin (NEURONTIN) 300 MG capsule Take 300 mg by mouth 2 (two) times daily.       . insulin aspart (NOVOLOG) 100 UNIT/ML injection Inject into the skin as directed.        . Magnesium 250 MG TABS Take 1 tablet by mouth daily.        . metaxalone (SKELAXIN) 800 MG tablet Take 800 mg by mouth as needed.       . metFORMIN (GLUCOPHAGE) 500 MG tablet Take 500 mg by mouth daily with breakfast.       . Misc Natural Products (OSTEO BI-FLEX JOINT SHIELD PO) Take 1 tablet by mouth daily.        . Omega-3 Fatty Acids (FISH OIL CONCENTRATE PO) Take 1 tablet by mouth daily.        Marland Kitchen oxyCODONE-acetaminophen (PERCOCET) 5-325 MG per tablet Take 1 tablet by mouth every 4 (four) hours as needed.        . pravastatin (PRAVACHOL) 40 MG tablet Take 1 tablet (40 mg total) by mouth daily.  30 tablet  3  . ramipril (ALTACE) 5 MG tablet Take 10 mg by mouth daily.         Allergies  Review of patient's allergies indicates no known allergies.  Electrocardiogram:NSR rate 84 normal ECG  Assessment and Plan

## 2012-09-21 NOTE — Patient Instructions (Addendum)
Your physician recommends that you continue on your current medications as directed. Please refer to the Current Medication list given to you today.  Your physician wants you to follow-up in: 1 year. You will receive a reminder letter in the mail two months in advance. If you don't receive a letter, please call our office to schedule the follow-up appointment.  Wenda Low in [place] at [facility] [phone]

## 2012-09-21 NOTE — Assessment & Plan Note (Signed)
Discussed bariatric surgery with her Has been obese since age 56  She will check insurance

## 2012-09-21 NOTE — Assessment & Plan Note (Signed)
A1c done from 9 to 7.4 range Oral agents make her gain weight  F/U Horald Pollen

## 2012-09-21 NOTE — Assessment & Plan Note (Signed)
Stable with no angina and good activity level.  Continue medical Rx  

## 2012-10-28 ENCOUNTER — Telehealth: Payer: Self-pay | Admitting: Cardiovascular Disease

## 2012-10-28 NOTE — Telephone Encounter (Signed)
Pt needs a letter to get bariatric surgery

## 2012-10-28 NOTE — Telephone Encounter (Signed)
Patient called requesting a letter of medical necessity for bariatric surgery sent to Dr Gaynelle Adu [facility], [phone].  Will forward to Dr Eden Emms for review.

## 2012-10-31 NOTE — Telephone Encounter (Signed)
Will forward to [place] Nurse with Dr Eden Emms

## 2012-10-31 NOTE — Telephone Encounter (Signed)
Ok to write letter that bariatric surgery is a medical necessity

## 2012-11-01 ENCOUNTER — Encounter: Payer: Self-pay | Admitting: *Deleted

## 2012-11-01 NOTE — Telephone Encounter (Signed)
LMTCB ./  NOTE FAXED TO DR ERIC WILSON  WANTING TO LET PT KNOW .Marland Kitchen/CY

## 2012-11-04 NOTE — Telephone Encounter (Signed)
LMTCB ./CY 

## 2012-11-07 ENCOUNTER — Telehealth: Payer: Self-pay | Admitting: Cardiovascular Disease

## 2012-11-07 NOTE — Telephone Encounter (Signed)
PT AWARE NOTE SENT DR ERIC WILSON  RE BARIATRIC SURGERY .Betty [name]

## 2012-11-07 NOTE — Telephone Encounter (Signed)
Pt rtn call from last week

## 2012-11-07 NOTE — Telephone Encounter (Signed)
SEE OTHER PHONE NOTE./CY 

## 2012-11-16 ENCOUNTER — Ambulatory Visit (INDEPENDENT_AMBULATORY_CARE_PROVIDER_SITE_OTHER): Payer: Federal, State, Local not specified - PPO | Admitting: General Surgery

## 2012-12-01 ENCOUNTER — Other Ambulatory Visit: Payer: Self-pay | Admitting: *Deleted

## 2012-12-01 MED ORDER — PRAVASTATIN SODIUM 40 MG PO TABS: 40.0000 mg | ORAL_TABLET | Freq: Every day | ORAL | Status: DC

## 2012-12-14 ENCOUNTER — Encounter (INDEPENDENT_AMBULATORY_CARE_PROVIDER_SITE_OTHER): Payer: Self-pay | Admitting: General Surgery

## 2012-12-14 ENCOUNTER — Ambulatory Visit (INDEPENDENT_AMBULATORY_CARE_PROVIDER_SITE_OTHER): Payer: Federal, State, Local not specified - PPO | Admitting: General Surgery

## 2012-12-14 VITALS — BP 140/82 | HR 89 | Temp 98.2°F | Resp 18 | Ht 67.0 in | Wt 279.0 lb

## 2012-12-14 NOTE — Progress Notes (Signed)
Patient ID: Betty [name], female   DOB: 09/06/1956, 57 y.o.   MRN: [id number]  Chief Complaint  Patient presents with  . Bariatric Pre-op    ? RNY    HPI Betty [name] is a 57 y.o. female.   HPI 57 yo morbidly obese WF referred by Dr Collins Scotland for evaluation of weight loss surgery. The patient has been thinking about weight loss surgery for at least a year. It was suggested and discussed by her cardiologist Dr. Eden Emms. The patient states that she has struggled with her weight ever since her adolescence. Patient states that she is interested in the gastric bypass. She states her primary goal is to come off insulin as well as decrease her blood pressure medications. Despite numerous attempts for sustained weight loss the patient has been unsuccessful for sustained weight loss. She has tried Weight Watchers on several occasions as well as the Atkins diet on several occasions. She has done the [employer]. She states she's also been seen at a low calorie diet Center in the past as well. Past Medical History  Diagnosis Date  . HYPERTENSION, BENIGN   . HYPERLIPIDEMIA-MIXED   . CAD, NATIVE VESSEL   . DM (diabetes mellitus)   . Osteoarthritis   . Obesity   . Sleep apnea     Past Surgical History  Procedure Laterality Date  . Coronary angioplasty with stent placement  11/2004    DES - LAD    Family History  Problem Relation Age of Onset  . Cancer    . Coronary artery disease    . Diabetes      Social History History  Substance Use Topics  . Smoking status: Never Smoker   . Smokeless tobacco: Not on file  . Alcohol Use: Not on file    No Known Allergies  Current Outpatient Prescriptions  Medication Sig Dispense Refill  . aspirin 81 MG tablet Take 81 mg by mouth daily.        . beta carotene 40981 UNIT capsule Take 25,000 Units by mouth daily.        . Biotin 5000 MCG TABS Take 1 tablet by mouth daily.       . Calcium Carbonate-Vitamin D (CALCIUM-VITAMIN D) 600-200  MG-UNIT CAPS Take by mouth.        . cetirizine (ZYRTEC) 10 MG tablet Take 10 mg by mouth daily.        Marland Kitchen gabapentin (NEURONTIN) 300 MG capsule Take 300 mg by mouth 2 (two) times daily.       . insulin aspart (NOVOLOG) 100 UNIT/ML injection Inject into the skin as directed.        . Magnesium 250 MG TABS Take 1 tablet by mouth daily.        . metaxalone (SKELAXIN) 800 MG tablet Take 800 mg by mouth as needed.       . metFORMIN (GLUCOPHAGE) 500 MG tablet Take 500 mg by mouth daily with breakfast.       . Misc Natural Products (OSTEO BI-FLEX JOINT SHIELD PO) Take 1 tablet by mouth daily.        . Omega-3 Fatty Acids (FISH OIL CONCENTRATE PO) Take 1 tablet by mouth daily.        Marland Kitchen oxyCODONE-acetaminophen (PERCOCET) 5-325 MG per tablet Take 1 tablet by mouth every 4 (four) hours as needed.        . pravastatin (PRAVACHOL) 40 MG tablet Take 1 tablet (40 mg total) by mouth daily.  30  tablet  3  . ramipril (ALTACE) 5 MG tablet Take 10 mg by mouth daily.        No current facility-administered medications for this visit.    Review of Systems Review of Systems  Constitutional: Positive for fatigue (daytime fatigue). Negative for fever, activity change, appetite change and unexpected weight change.  HENT: Positive for congestion and neck pain. Negative for hearing loss, nosebleeds, trouble swallowing and sinus pressure.   Eyes: Negative for photophobia and visual disturbance.  Respiratory: Positive for cough. Negative for chest tightness, shortness of breath and wheezing.        Treated for PNA as outpt in Jan. Was tested for OSA in 2007 which was positive. Didn't followup with getting titrated for CPAP  Cardiovascular: Positive for leg swelling (some b/l ankle swelling if stands for long time). Negative for chest pain and palpitations.       Denies CP, orthopnea, PND, SOB. Some DOE. Had stent placed in 2006  Gastrointestinal: Negative for abdominal pain, diarrhea, constipation and blood in stool.        Denies reflux. nml colonoscopy per pt. S/p lap chole  Endocrine:       Diabetic since 1994; has DM neuropathy, managed dr Talmage Nap  Genitourinary: Negative for dysuria, urgency, difficulty urinating and pelvic pain.  Musculoskeletal:       Has b/l knee pain  Skin: Negative for pallor and rash.  Neurological: Negative for dizziness, tremors, syncope, weakness, light-headedness and headaches.       Denies TIAs, transient monocular blindness  Hematological: Negative for adenopathy. Does not bruise/bleed easily.  Psychiatric/Behavioral: Negative for behavioral problems. The patient is not nervous/anxious.     Height 5\' 7"  (1.702 m), weight 279 lb (126.554 kg).  Physical Exam Physical Exam  Vitals reviewed. Constitutional: She is oriented to person, place, and time. She appears well-developed and well-nourished. No distress.  Morbidly obese  HENT:  Head: Normocephalic and atraumatic. Hair is abnormal (thinning).  Right Ear: External ear normal.  Left Ear: External ear normal.  Eyes: Conjunctivae and EOM are normal. No scleral icterus.  Neck: Normal range of motion. Neck supple. No tracheal deviation present. No thyromegaly present.  Cardiovascular: Normal rate, regular rhythm, normal heart sounds and intact distal pulses.   Pulmonary/Chest: Effort normal and breath sounds normal. No respiratory distress. She has no wheezes.  Abdominal: She exhibits no distension. There is no tenderness. There is no rebound.    Obese, well healed trocar sites  Musculoskeletal: She exhibits no edema and no tenderness.  Lymphadenopathy:    She has no cervical adenopathy.  Neurological: She is alert and oriented to person, place, and time. No cranial nerve deficit. She exhibits normal muscle tone.  Skin: Skin is warm and dry. No rash noted. She is not diaphoretic. No erythema. No pallor.  Psychiatric: She has a normal mood and affect. Her behavior is normal. Judgment and thought content normal.    Data  Reviewed Dr Fabio Bering last office note Cardiac cath report 2006 Sleep study 2007 - moderate OSA  Assessment    Morbid obesity IDDM HTN CAD s/p PTCA/stent OA HPL OSA     Plan    The patient meets weight loss surgery criteria. I think the patient would be an acceptable candidate for Laparoscopic Roux-en-Y Gastric bypass. I explained that if her primary goal is to improve her diabetes then Roux-en-Y gastric bypass is the best option.   We discussed laparoscopic Roux-en-Y gastric bypass. We discussed the preoperative, operative and postoperative  process. Using diagrams, I explained the surgery in detail including the performance of an EGD near the end of the surgery and an Upper GI swallow study on POD 1. We discussed the typical hospital course including a 2-3 day stay baring any complications.   The patient was given educational material. I quoted the patient that they can expect to lose 50-70% of their excess weight with the gastric bypass. We did discuss the possibility of weight regain several years after the procedure.  We discussed the risk and benefits of surgery including but not limited to anesthesia risk, bleeding, infection, anastomotic edema requiring a few additional days in the hospital, postop nausea, blood clot formation, anastomotic leak, anastomotic stricture, ulcer formation, death, respiratory complications, intestinal blockage, internal hernia, gallstone formation, vitamin and nutritional deficiencies, injury to surrounding structures, failure to lose weight and mood changes.  We discussed that before and after surgery that there would be an alteration in their diet. I explained that we have put them on a diet 2 weeks before surgery. I also explained that they would be on a liquid diet for 2 weeks after surgery. We discussed that they would have to avoid certain foods such as sugar after surgery. We discussed the importance of physical activity as well as compliance with our  dietary and supplement recommendations and routine follow-up.  I explained to the patient that we will start our evaluation process which includes labs, Upper GI to evaluate stomach and swallowing anatomy, nutritionist consultation, psychiatrist consultation, EKG, CXR, bone density scan. She'll need cardiac clearance prior to surgery if she is approved for surgery.  I will also need to discuss her sleep apnea study with the pulmonologist. I believe she needs to have a CPAP titration study. Her Epworth sleep scale score was 17  I look forward to working with this patient  Mary Sella. Andrey Campanile, MD, FACS General, Bariatric, & Minimally Invasive Surgery [facility], [place]         [facility] M 12/14/2012, 11:21 AM

## 2012-12-14 NOTE — Patient Instructions (Signed)
We will start our work-up. Please call with questions 

## 2012-12-22 ENCOUNTER — Encounter: Payer: Self-pay | Admitting: *Deleted

## 2012-12-22 ENCOUNTER — Encounter: Payer: Federal, State, Local not specified - PPO | Attending: General Surgery | Admitting: *Deleted

## 2012-12-22 VITALS — Ht 67.0 in | Wt 278.3 lb

## 2012-12-22 DIAGNOSIS — E669 Obesity, unspecified: Secondary | ICD-10-CM | POA: Insufficient documentation

## 2012-12-22 DIAGNOSIS — Z713 Dietary counseling and surveillance: Secondary | ICD-10-CM | POA: Insufficient documentation

## 2012-12-22 NOTE — Patient Instructions (Addendum)
   Follow Pre-Op Nutrition Goals to prepare for Gastric Bypass Surgery.  Aim for 30 min of exercise daily - Try water exercises.   Aim for 45 grams of carbs per meal and 15 grams per snack.  NO meal skipping. This is likely causing your lows.   Call the [facility] at [phone] once you have been given your surgery date to enrolled in the Pre-Op Nutrition Class. You will need to attend this nutrition class 3-4 weeks prior to your surgery.   As your dietitian, I am ordering your son to clean the oven!!!! If you continue to eat out, your blood pressure will be elevated and your kidneys will pay the price.  LIMIT eating out!!  Will save you money too.

## 2012-12-22 NOTE — Progress Notes (Signed)
  Pre-Op Assessment Visit:  Pre-Operative RYGB Surgery  Medical Nutrition Therapy:  Appt start time: 1600   End time:  1730.  Patient was seen on 12/22/2012 for Pre-Operative RYGB Nutrition Assessment. Assessment and letter of approval faxed to [facility] Bariatric Surgery Program coordinator on 12/23/12.  Approval letter sent to [facility] Scan center and will be available in the chart under the media tab.  Handouts given during visit include:  Pre-Op Goals   Bariatric Surgery Protein Shakes  Low CHO Snack List  45g CHO Meal Plan  Bariatric Surgery Fast Food Guide  Meal Planning Card  Living Well with Diabetes book - [employer]  Follow up in 4 weeks for 2nd SWL visit for insurance.

## 2013-01-04 ENCOUNTER — Ambulatory Visit (HOSPITAL_COMMUNITY)
Admission: RE | Admit: 2013-01-04 | Discharge: 2013-01-04 | Disposition: A | Discharge status: Home / Self Care | Payer: Federal, State, Local not specified - PPO | Source: Ambulatory Visit | Attending: General Surgery | Admitting: General Surgery

## 2013-01-04 DIAGNOSIS — Z3689 Encounter for other specified antenatal screening: Secondary | ICD-10-CM | POA: Insufficient documentation

## 2013-01-04 DIAGNOSIS — G4730 Sleep apnea, unspecified: Secondary | ICD-10-CM | POA: Insufficient documentation

## 2013-01-04 DIAGNOSIS — I10 Essential (primary) hypertension: Secondary | ICD-10-CM | POA: Insufficient documentation

## 2013-01-04 DIAGNOSIS — M1990 Unspecified osteoarthritis, unspecified site: Secondary | ICD-10-CM | POA: Insufficient documentation

## 2013-01-04 DIAGNOSIS — I2510 Atherosclerotic heart disease of native coronary artery without angina pectoris: Secondary | ICD-10-CM | POA: Insufficient documentation

## 2013-01-04 DIAGNOSIS — E785 Hyperlipidemia, unspecified: Secondary | ICD-10-CM | POA: Insufficient documentation

## 2013-01-04 DIAGNOSIS — E119 Type 2 diabetes mellitus without complications: Secondary | ICD-10-CM | POA: Insufficient documentation

## 2013-01-09 LAB — CBC WITH DIFFERENTIAL/PLATELET
Eosinophils Absolute: 0.3 10*3/uL (ref 0.0–0.7)
Hemoglobin: 13.2 g/dL (ref 12.0–15.0)
Lymphs Abs: 4.4 10*3/uL — ABNORMAL HIGH (ref 0.7–4.0)
MCH: 29.5 pg (ref 26.0–34.0)
MCV: 87.9 fL (ref 78.0–100.0)
Monocytes Relative: 7 % (ref 3–12)
Neutrophils Relative %: 55 % (ref 43–77)
RBC: 4.48 MIL/uL (ref 3.87–5.11)

## 2013-01-09 LAB — COMPREHENSIVE METABOLIC PANEL
Albumin: 4.1 g/dL (ref 3.5–5.2)
CO2: 27 mEq/L (ref 19–32)
Calcium: 9.7 mg/dL (ref 8.4–10.5)
Chloride: 101 mEq/L (ref 96–112)
Glucose, Bld: 141 mg/dL — ABNORMAL HIGH (ref 70–99)
Sodium: 139 mEq/L (ref 135–145)
Total Bilirubin: 0.5 mg/dL (ref 0.3–1.2)
Total Protein: 7.1 g/dL (ref 6.0–8.3)

## 2013-01-09 LAB — LIPID PANEL
Cholesterol: 167 mg/dL (ref 0–200)
Total CHOL/HDL Ratio: 3.5 Ratio

## 2013-01-09 LAB — HEMOGLOBIN A1C: Mean Plasma Glucose: 206 mg/dL — ABNORMAL HIGH (ref <117)

## 2013-01-10 LAB — H. PYLORI ANTIBODY, IGG: H Pylori IgG: 4.19 {ISR} — ABNORMAL HIGH

## 2013-01-12 ENCOUNTER — Other Ambulatory Visit (INDEPENDENT_AMBULATORY_CARE_PROVIDER_SITE_OTHER): Payer: Self-pay

## 2013-01-12 DIAGNOSIS — G4733 Obstructive sleep apnea (adult) (pediatric): Secondary | ICD-10-CM

## 2013-01-13 ENCOUNTER — Telehealth (INDEPENDENT_AMBULATORY_CARE_PROVIDER_SITE_OTHER): Payer: Self-pay

## 2013-01-13 MED ORDER — CHOLESTYRAMINE LIGHT 4 G PO PACK: 4.0000 g | PACK | Freq: Two times a day (BID) | ORAL | Status: DC

## 2013-01-13 NOTE — Telephone Encounter (Signed)
Called pt to let her know that her bone density study is normal and she tested positive for the h-pylori test. Dr Andrey Campanile ordered a prev pack for the pt to take for 2wks. I prescribed thru epic to go to [facility]. I advised pt that once she completes the Prev pac she needs to call Leandrew Koyanagi to let her know that she completed the Prev pac. The pt understands.

## 2013-01-16 ENCOUNTER — Telehealth (INDEPENDENT_AMBULATORY_CARE_PROVIDER_SITE_OTHER): Payer: Self-pay

## 2013-01-16 NOTE — Telephone Encounter (Signed)
He received an electronic request for medication (Prevalite).  The pt told him it was to be Prev Pac.  I read the chart and clarified it should be Prev Pac po bid for 2 weeks.

## 2013-01-19 ENCOUNTER — Ambulatory Visit: Payer: Federal, State, Local not specified - PPO | Admitting: *Deleted

## 2013-01-19 ENCOUNTER — Telehealth (INDEPENDENT_AMBULATORY_CARE_PROVIDER_SITE_OTHER): Payer: Self-pay

## 2013-01-19 NOTE — Telephone Encounter (Signed)
Pt called stating her son came home with a GI bug and now she thinks she has it. Pt states she just started 2 days ago on a prev pac for h pilori. I advised pt to d/c prev pac until vomiting resolves from virus. Pt is to call her PCP to treat virus. Pt will restart prev pac once virus has ran its course. Pt to call with any concerns.

## 2013-01-27 ENCOUNTER — Ambulatory Visit (HOSPITAL_BASED_OUTPATIENT_CLINIC_OR_DEPARTMENT_OTHER): Payer: Federal, State, Local not specified - PPO | Attending: General Surgery

## 2013-01-27 DIAGNOSIS — G4733 Obstructive sleep apnea (adult) (pediatric): Secondary | ICD-10-CM | POA: Insufficient documentation

## 2013-01-28 DIAGNOSIS — G4733 Obstructive sleep apnea (adult) (pediatric): Secondary | ICD-10-CM

## 2013-01-28 DIAGNOSIS — R0609 Other forms of dyspnea: Secondary | ICD-10-CM

## 2013-01-28 DIAGNOSIS — R0989 Other specified symptoms and signs involving the circulatory and respiratory systems: Secondary | ICD-10-CM

## 2013-01-28 NOTE — Procedures (Signed)
Betty [name], Betty [name]          ACCOUNT NO.:  [id number]  MEDICAL RECORD NO.:  [id number]          PATIENT TYPE:  OUT  LOCATION:  SLEEP CENTER                 FACILITY:  [facility]  PHYSICIAN:  Debar Plate D. Maple Hudson, MD, FCCP, FACPDATE OF BIRTH:  19-Oct-1955  DATE OF STUDY:  01/27/2013                           NOCTURNAL POLYSOMNOGRAM  REFERRING PHYSICIAN:  Mary Sella. Andrey Campanile, MD, FACS  INDICATION FOR STUDY:  Insomnia with sleep apnea.  EPWORTH SLEEPINESS SCORE:  17/24.  BMI 43, weight 275 pounds, height 67 inches, neck 17 inches.  MEDICATIONS:  Home medications are charted and reviewed.  SLEEP ARCHITECTURE:  Total sleep time 207 minutes with sleep efficiency 47.6%.  Stage I was 8.5%, stage II 64%,  stage III 0.2%, REM 27.3% of total sleep time.  Sleep latency 15.5 minutes, REM latency 334.5 minutes.  Awake after sleep onset 212.5 minutes.  Arousal index 2.6. Bedtime medication:  Metformin, gabapentin, calcium, Osteo Bi-Flex.  RESPIRATORY DATA:  Apnea-hypopnea index (AHI) 18.6 per hour.  A total of 64 events was scored including 2 obstructive apneas and 62 hypopneas. Events were mostly associated with nonsupine sleep position and with REM.  REM AHI 60.5 per hour.  There were not enough early events to permit application of split protocol CPAP titration on this study.  OXYGEN DATA:  Loud snoring with oxygen desaturation to a nadir of 70% and mean oxygen saturation through the study of 92.8% on room air.  CARDIAC DATA:  Sinus rhythm.  MOVEMENT-PARASOMNIA:  A total of 48 limb jerks were counted of which two were associated with arousals or awakenings for periodic limb movements with arousal index of 0.6 per hour.  Bathroom x2.  IMPRESSIONS-RECOMMENDATIONS: 1. Difficulty initiating and maintaining sleep.  After an initial     interrupted sleep segment starting at about 10:30 p.m., she was     awake from midnight until nearly 3:00 a.m. before regaining sleep     through the rest of the  study.  Bedtime medications as noted above. 2. Moderate obstructive sleep apnea/hypopnea syndrome, AHI 18.6 per     hour with mostly nonsupine events.  Almost all events were     associated with a sustained hour of REM sleep between 4 and 5 a.m.     loud snoring with oxygen desaturation to a nadir of 70% and mean     oxygen saturation through the study of 92.8% on room air. 3. There were not enough early sleep or early respiratory events to     permit application of CPAP titration by split protocol on this     study. 4. If CPAP therapy is considered, suggest return for dedicated CPAP     titration study bringing a sleep medication if appropriate to aid     with sleep consolidation.     Kinsey Cowsert D. Maple Hudson, MD, Tonny Bollman, FACP Diplomate, American Board of Sleep Medicine    CDY/MEDQ  D:  01/28/2013 11:21:52  T:  01/28/2013 12:24:12  Job:  [id number]

## 2013-01-30 ENCOUNTER — Encounter (INDEPENDENT_AMBULATORY_CARE_PROVIDER_SITE_OTHER): Payer: Self-pay | Admitting: General Surgery

## 2013-01-30 ENCOUNTER — Telehealth (INDEPENDENT_AMBULATORY_CARE_PROVIDER_SITE_OTHER): Payer: Self-pay | Admitting: General Surgery

## 2013-01-30 MED ORDER — ZOLPIDEM TARTRATE 5 MG PO TABS: 5.0000 mg | ORAL_TABLET | Freq: Every evening | ORAL | Status: DC | PRN

## 2013-01-30 NOTE — Telephone Encounter (Signed)
Ambien 5 mg #1 with no refills called to [facility]. Order to CPAP Titration study entered into EPIC.

## 2013-01-30 NOTE — Telephone Encounter (Signed)
Message copied by Liliana Cline on Mon Jan 30, 2013  1:52 PM ------      Message from: Andrey Campanile, ERIC M      Created: Mon Jan 30, 2013 10:06 AM       Had recent sleep study but pt had difficulty initiating and maintaining sleep            Need to get dedicated CPAP titration study. Pt may have ambien 5mg  po x 1 on call for study            Thanks      Wilson       ------

## 2013-01-31 ENCOUNTER — Telehealth (INDEPENDENT_AMBULATORY_CARE_PROVIDER_SITE_OTHER): Payer: Self-pay | Admitting: General Surgery

## 2013-01-31 NOTE — Telephone Encounter (Signed)
Left message on machine for patient to call back and ask for me.   

## 2013-01-31 NOTE — Telephone Encounter (Signed)
Message copied by Liliana Cline on Tue Jan 31, 2013  9:37 AM ------      Message from: Leandrew Koyanagi      Created: Tue Jan 31, 2013  9:25 AM      Regarding: Patient tried to call       Ashok Norris,            I received an email from the patient stating she tried to call [facility] and ask for you but did not get in touch. She requests a call back.            Thanks,            Coy Saunas ------

## 2013-01-31 NOTE — Telephone Encounter (Signed)
Left message on patient's work number per her request. Awaiting call back.

## 2013-02-02 ENCOUNTER — Encounter: Payer: Federal, State, Local not specified - PPO | Attending: General Surgery

## 2013-02-02 VITALS — Ht 67.0 in | Wt 279.6 lb

## 2013-02-02 DIAGNOSIS — E669 Obesity, unspecified: Secondary | ICD-10-CM

## 2013-02-02 DIAGNOSIS — Z713 Dietary counseling and surveillance: Secondary | ICD-10-CM | POA: Insufficient documentation

## 2013-02-14 ENCOUNTER — Telehealth (INDEPENDENT_AMBULATORY_CARE_PROVIDER_SITE_OTHER): Payer: Self-pay | Admitting: General Surgery

## 2013-02-14 NOTE — Telephone Encounter (Signed)
Called and spoke to patient and explained the need for CPAP titration test. She had multiple questions about different CPAP masks and procedures and I encouraged her to call and speak with the sleep center about these questions.

## 2013-02-14 NOTE — Telephone Encounter (Signed)
Message copied by Liliana Cline on Tue Feb 14, 2013  3:22 PM ------      Message from: Rise Paganini      Created: Tue Feb 14, 2013 12:25 PM      Regarding: Andrey Campanile      Contact: [phone]       Patient would like to speak you in regards to the sleep study. Please reach her att work before 1:00pm or after 2:30pm, but before 5:30pm. Thank you. ------

## 2013-02-23 NOTE — Progress Notes (Addendum)
  Supervised Weight Loss Visit: Pre-Operative RYGB Surgery  Medical Nutrition Therapy:  Appt start time: 1730  End time:  1800.  Primary concerns today:   Pre-operative Bariatric Surgery Supervised Weight Loss  Last weight: 278.3 lbs Weight today: 279.6 lbs BMI: 43.8 kg/m^2  Medications: See med list  Changes in eating habits: Cutting back on carbs  Recent physical activity:  Chair exercises daily   Progress Towards Goal(s):  In progress.   Nutritional Diagnosis:  [place]-3.3 Overweight/obesity As related to poor food choices and lack of physical activity.  As evidenced by patient reported dietary intake history and a BMI of 43.8 kg/m^2.    Intervention:  Nutrition education regarding CHO metabolism, CHO counting, and effects of exercise.   Monitoring/Evaluation:  Dietary intake, exercise, and body weight. Follow up in 1 month for supervised weight loss visit.

## 2013-02-24 NOTE — Patient Instructions (Signed)
   Follow Pre-Op Nutrition Goals to prepare for Gastric Bypass Surgery.  Aim for 30 min of exercise daily - Try water exercises.   Aim for 45 grams of carbs per meal and 15 grams per snack.  NO meal skipping. This is likely causing your lows.   Call the [facility] at [phone] once you have been given your surgery date to enrolled in the Pre-Op Nutrition Class. You will need to attend this nutrition class 3-4 weeks prior to your surgery.   As your dietitian, I am ordering your son to clean the oven!!!! If you continue to eat out, your blood pressure will be elevated and your kidneys will pay the price.  LIMIT eating out!!  Will save you money too.

## 2013-02-28 ENCOUNTER — Ambulatory Visit (INDEPENDENT_AMBULATORY_CARE_PROVIDER_SITE_OTHER): Payer: Federal, State, Local not specified - PPO | Admitting: Internal Medicine

## 2013-02-28 ENCOUNTER — Encounter: Payer: Self-pay | Admitting: Internal Medicine

## 2013-02-28 VITALS — BP 124/72 | HR 88 | Temp 98.6°F | Resp 12 | Ht 67.0 in | Wt 280.0 lb

## 2013-02-28 DIAGNOSIS — E119 Type 2 diabetes mellitus without complications: Secondary | ICD-10-CM

## 2013-02-28 MED ORDER — INSULIN REGULAR HUMAN 100 UNIT/ML IJ SOLN: INTRAMUSCULAR | Status: DC

## 2013-02-28 MED ORDER — INSULIN NPH (HUMAN) (ISOPHANE) 100 UNIT/ML ~~LOC~~ SUSP: 30.0000 [IU] | Freq: Two times a day (BID) | SUBCUTANEOUS | Status: DC

## 2013-02-28 NOTE — Progress Notes (Signed)
Patient ID: Betty [name], female   DOB: April 14, 1956, 57 y.o.   MRN: [id number]  HPI: Betty [name] is a 57 y.o.-year-old female, referred by her PCP, Dr. Herb Grays, for management of DM2, insulin-dependent, uncontrolled, with complications (CAD, peripheral neuropathy). She has seen Dr. Talmage Nap in the past, would like to follow with me.   Patient has long standing diabetes (>10 years), with the last hemoglobin A1c: Lab Results  Component Value Date   HGBA1C 8.8* 01/09/2013  previously 7 mo ago, in the 7's.   Pt is on a regimen of: - Metformin 500 mg daily in hs >> was on 2000 mg daily, but had to lower it b/c diarrhea - Relion 70/30 insulin vial: 70-20-30 (split in 3 b/c she was dropping her sugars overnight) or 70-50 She was on Novolog in the past but could not afford it.  A review of records from 2006, at that time she was on Amaryl and Avandia.  Pt checks her sugars once a day (does not bring a log): - am: 49-235, but mostly in the 100 range - before dinner: can be in the 90's, but does not usually check sugars then Has lows, usually in am upon waking up. She had to eat a snack at night to avoid dropping her sugars over night. Lowest sugar was 49; she has hypoglycemia awareness at 80. Highest sugar was 235.  Pt's meals are: - Breakfast: steak biscuit mc muffin - Lunch: mc donalds: mc nuggets + side salad - Dinner: chinese food, roast meat + mashed potatoes, spam + eggs; eats out a lot - Snacks:  She has seen nutrition in the past.  She was recently dx with H pylori. She is now being treated. She will have a gastric bypass surgery soon, maybe 5 mo away.   Pt does not have chronic kidney disease, last BUN/creatinine was:  Lab Results  Component Value Date   BUN 12 01/09/2013   CREATININE 0.58 01/09/2013   Last set of lipids: Lab Results  Component Value Date   CHOL 167 01/09/2013   HDL 48 01/09/2013   LDLCALC 93 01/09/2013   TRIG 132 01/09/2013   CHOLHDL 3.5 01/09/2013    Pt's last eye exam was in >1 year (Dr. Senaida Ores in [place]). No DR reportedly. Has numbness and tingling in her legs, was on Lyrica but had to stop due to somnolence.   I reviewed her chart and she also has a history of HL - on cholestyramine, pravastatin 40, omega 3 fish oil; HTN - on Ramipril, Obesity, OSA, OA - on Osteobiflex.  Pt has FH of DM in mother, uncles, MGM, brother, sister.   ROS: Constitutional: Fatigue, increased appetite, weight gain and weight loss, hot flashes sometimes, poor sleep, excessive urination, waking up at night to urinate Eyes: + blurry vision, no xerophthalmia ENT: no sore throat, no nodules palpated in throat, no dysphagia/odynophagia, + hoarseness, + tinnitus Cardiovascular: no CP/SOB/palpitations/swelling in hands and feet Respiratory: no cough/SOB/+ occasional wheezing  Gastrointestinal: + N/no V/+D/+C Musculoskeletal: + muscle/+ joint aches Skin: no rashes, + easy bruising, itching,  hair loss Neurological: + tremors/+ numbness and tingling in feet/dizziness, headaches Psychiatric: no depression/anxiety  Past Medical History  Diagnosis Date  . HYPERTENSION, BENIGN   . HYPERLIPIDEMIA-MIXED   . CAD, NATIVE VESSEL   . DM (diabetes mellitus)   . Osteoarthritis   . Obesity   . Sleep apnea    Past Surgical History  Procedure Laterality Date  . Coronary angioplasty with  stent placement  11/2004    DES - LAD  . Cesarean section    . Cholecystectomy  1996  . Coronary stent placement  11/2004   History   Social History  . Marital Status: Divorced    Number of Children: 2: 40 and 65 y/o   Occupational History  . Legal assistant   Social History Main Topics  . Smoking status: Never Smoker   . Smokeless tobacco: Not on file  . Alcohol Use: No  . Drug Use: No   Current Outpatient Prescriptions on File Prior to Visit  Medication Sig Dispense Refill  . aspirin 81 MG tablet Take 81 mg by mouth daily.        . B-D UF III MINI PEN NEEDLES  31G X 5 MM MISC       . beta carotene 16109 UNIT capsule Take 25,000 Units by mouth daily.        . Biotin 5000 MCG TABS Take 1 tablet by mouth daily.       . Calcium Carbonate-Vitamin D (CALCIUM-VITAMIN D) 600-200 MG-UNIT CAPS Take by mouth.        . cetirizine (ZYRTEC) 10 MG tablet Take 10 mg by mouth daily.        . chlorpheniramine-HYDROcodone (TUSSIONEX) 10-8 MG/5ML LQCR       . cholestyramine light (PREVALITE) 4 G packet Take 1 packet (4 g total) by mouth 2 (two) times daily.  1 packet  0  . gabapentin (NEURONTIN) 300 MG capsule Take 300 mg by mouth 2 (two) times daily.       Marland Kitchen lidocaine (XYLOCAINE) 2 % solution       . Magnesium 250 MG TABS Take 1 tablet by mouth daily.        . metaxalone (SKELAXIN) 800 MG tablet Take 800 mg by mouth as needed.       . metFORMIN (GLUCOPHAGE) 500 MG tablet Take 500 mg by mouth daily with breakfast.       . Misc Natural Products (OSTEO BI-FLEX JOINT SHIELD PO) Take 1 tablet by mouth daily.        . Omega-3 Fatty Acids (FISH OIL CONCENTRATE PO) Take 1 tablet by mouth daily.        Marland Kitchen oxyCODONE-acetaminophen (PERCOCET) 5-325 MG per tablet Take 1 tablet by mouth every 4 (four) hours as needed.        . pravastatin (PRAVACHOL) 40 MG tablet Take 1 tablet (40 mg total) by mouth daily.  30 tablet  3  . ramipril (ALTACE) 5 MG tablet Take 10 mg by mouth daily.       Marland Kitchen RELION INSULIN SYR 1CC/30G 30G X 5/16" 1 ML MISC       . zolpidem (AMBIEN) 5 MG tablet Take 1 tablet (5 mg total) by mouth at bedtime as needed for sleep.  1 tablet  0   No current facility-administered medications on file prior to visit.   Allergies  Allergen Reactions  . Lyrica (Pregabalin) Other (See Comments)    sleepy   Family History  Problem Relation Age of Onset  . Cancer    . Coronary artery disease    . Diabetes    . Heart disease Mother   . Cancer Mother     ovarian  . Arthritis Mother   . Diabetes Mother   . Hypertension Mother   . Hypertension Father   . Stroke Father    . Diabetes Sister    PE: BP 124/72  Pulse  88  Temp(Src) 98.6 F (37 C) (Oral)  Resp 12  Ht 5\' 7"  (1.702 m)  Wt 280 lb (127.007 kg)  BMI 43.84 kg/m2  SpO2 96% Wt Readings from Last 3 Encounters:  02/28/13 280 lb (127.007 kg)  02/02/13 279 lb 9.6 oz (126.826 kg)  01/27/13 275 lb (124.739 kg)   Constitutional: overweight, in NAD, pale Eyes: PERRLA, EOMI, no exophthalmos ENT: moist mucous membranes, no thyromegaly, no cervical lymphadenopathy Cardiovascular: RRR, No MRG, mild periankle edema Respiratory: CTA B Gastrointestinal: abdomen soft, NT, ND, BS+ Musculoskeletal: no deformities, strength intact in all 4 Skin: moist, warm, no rashes; thin hair Neurological: no tremor with outstretched hands, DTR normal in all 4  ASSESSMENT: 1. DM2, insulin-dependent, uncontrolled, with complications - CAD - s/p stent 11/2004 - peripheral neuropathy  PLAN:  1. Patient with long-standing insulin-dependent diabetes, on the 70/30 insulin regimen, but with fluctuating CBGs in a.m.: lows in the 40s alternating with sugars in the 200s. - We discussed about the fact that usually widely fluctuating sugars in a.m. archons sequence of to much basal insulin the night before, for the fact that her NPH is administered too soon before going to bed. In her case, she is taking NPH mixed with regular insulin before dinner, which is recognized to be a cause of hypoglycemia in a.m. I think the best thing for her is to split the NPH and regular, to be able to administer the long-acting insulin at bedtime, rather than pre-dinner. - We can stay with ReliOn insulin, but she will need to mixed insulin to herself in a.m. and take them separately later in the day - We will use the following regimen: Breakfast lunch dinner bedtime  Insulin N: 30 units - - 30 units  Insulin R: 20 units 20 units 20 units -  - Given instructions about how to mix the 2 insulins - sent Rx's to her pharmacy - Will also increase the dose  of her metformin, as tolerated, to 500 mg twice a day with her meals. I advised her to stop the metformin in a.m. and continue only with dinner if her diarrhea returns. - We discussed at length about her diet, I believe this is a major problem for her, with almost all of her meals being fast food. Despite the fact that she had nutrition classes in the past she was still convinced that this is a good diabetic diet, since it does not contain carbs. I explained that increased fat will exacerbate her insulin resistance, and will very much worsen her diabetes. I gave her several examples about better food choices, and she is ready to try these. I advised her to get the bulk of her meals from vegetables and fruit. - given sugar log and advised how to fill it and to bring it at next appt - continue to check to 3 times a day, before meals and at bedtime - given foot care handout and explained the principles - given instructions for hypoglycemia management "15-15 rule" - Given general instructions about diabetes - We discussed about her upcoming gastric bypass surgery, and the fact that her insulin requirements will dramatically decrease after this - I strongly advised her to join my chart - Return to clinic in 3 weeks with her sugar log - No labs today

## 2013-02-28 NOTE — Patient Instructions (Addendum)
Please return in 3 weeks with your sugar log.  Please stop the ReliOn 70/30 insulin. Please use the following insulin regimen: Breakfast lunch dinner bedtime  Insulin N: 30 units - - 30 units  Insulin R: 20 units 20 units 20 units -  You can mix the N and the R insulin before breakfast - please see the attached instructions. Take the R insul;in 30 minutes before you plan to eat. You can keep the insulin in use out of the fridge.  PATIENT INSTRUCTIONS FOR TYPE 2 DIABETES:  **Please join MyChart!** - see attached instructions about how to join   DIET AND EXERCISE Diet and exercise is an important part of diabetic treatment.  We recommended aerobic exercise in the form of brisk walking (working between 40-60% of maximal aerobic capacity, similar to brisk walking) for 150 minutes per week (such as 30 minutes five days per week) along with 3 times per week performing 'resistance' training (using various gauge rubber tubes with handles) 5-10 exercises involving the major muscle groups (upper body, lower body and core) performing 10-15 repetitions (or near fatigue) each exercise. Start at half the above goal but build slowly to reach the above goals. If limited by weight, joint pain, or disability, we recommend daily walking in a swimming pool with water up to waist to reduce pressure from joints while allow for adequate exercise.    BLOOD GLUCOSES Monitoring your blood glucoses is important for continued management of your diabetes. Please check your blood glucoses 2-4 times a day: fasting, before meals and at bedtime (you can rotate these measurements - e.g. one day check before the 3 meals, the next day check before 2 of the meals and before bedtime, etc.   HYPOGLYCEMIA (low blood sugar) Hypoglycemia is usually a reaction to not eating, exercising, or taking too much insulin/ other diabetes drugs.  Symptoms include tremors, sweating, hunger, confusion, headache, etc. Treat IMMEDIATELY with 15  grams of Carbs:   4 glucose tablets    cup regular juice/soda   2 tablespoons raisins   4 teaspoons sugar   1 tablespoon honey Recheck blood glucose in 15 mins and repeat above if still symptomatic/blood glucose <100. Please contact our office at [phone] if you have questions about how to next handle your insulin.  RECOMMENDATIONS TO REDUCE YOUR RISK OF DIABETIC COMPLICATIONS: * Take your prescribed MEDICATION(S). * Follow a DIABETIC diet: Complex carbs, fiber rich foods, heart healthy fish twice weekly, (monounsaturated and polyunsaturated) fats * AVOID saturated/trans fats, high fat foods, >2,300 mg salt per day. * EXERCISE at least 5 times a week for 30 minutes or preferably daily.  * DO NOT SMOKE OR DRINK more than 1 drink a day. * Check your FEET every day. Do not wear tightfitting shoes. Contact us if you develop an ulcer * See your EYE doctor once a year or more if needed * Get a FLU shot once a year * Get a PNEUMONIA vaccine once before and once after age 4 years  GOALS:  * Your Hemoglobin A1c of <7%  * Your Systolic BP should be 140 or lower  * Your Diastolic BP should be 80 or lower  * Your HDL (Good Cholesterol) should be 40 or higher  * Your LDL (Bad Cholesterol) should be 100 or lower  * Your Triglycerides should be 150 or lower  * Your Urine microalbumin (kidney function) should be <30 * Your Body Mass Index should be 25 or lower   We will be  glad to help you achieve these goals. Our telephone number is: [phone].

## 2013-03-02 ENCOUNTER — Encounter: Payer: Federal, State, Local not specified - PPO | Attending: General Surgery | Admitting: *Deleted

## 2013-03-02 VITALS — Ht 67.0 in | Wt 273.9 lb

## 2013-03-02 DIAGNOSIS — Z713 Dietary counseling and surveillance: Secondary | ICD-10-CM | POA: Insufficient documentation

## 2013-03-02 DIAGNOSIS — E669 Obesity, unspecified: Secondary | ICD-10-CM | POA: Insufficient documentation

## 2013-03-08 ENCOUNTER — Ambulatory Visit (HOSPITAL_COMMUNITY)
Admission: RE | Admit: 2013-03-08 | Discharge: 2013-03-08 | Disposition: A | Discharge status: Home / Self Care | Payer: Federal, State, Local not specified - PPO | Source: Ambulatory Visit | Attending: General Surgery | Admitting: General Surgery

## 2013-03-08 ENCOUNTER — Encounter (HOSPITAL_COMMUNITY)
Admission: RE | Disposition: A | Discharge status: Home / Self Care | Payer: Self-pay | Source: Ambulatory Visit | Attending: General Surgery

## 2013-03-08 HISTORY — PX: BREATH TEK H PYLORI: SHX5422

## 2013-03-08 SURGERY — BREATH TEST, FOR HELICOBACTER PYLORI

## 2013-03-13 ENCOUNTER — Encounter (HOSPITAL_COMMUNITY): Payer: Self-pay | Admitting: General Surgery

## 2013-03-15 ENCOUNTER — Other Ambulatory Visit: Payer: Self-pay | Admitting: *Deleted

## 2013-03-15 MED ORDER — METFORMIN HCL 500 MG PO TABS: ORAL_TABLET | ORAL | Status: DC

## 2013-03-15 NOTE — Telephone Encounter (Signed)
Pt called requesting refill of her metformin (due to Dr Charlean Sanfilippo dosage increase). Sending rx to [facility] per pt's request.

## 2013-03-27 ENCOUNTER — Other Ambulatory Visit (HOSPITAL_BASED_OUTPATIENT_CLINIC_OR_DEPARTMENT_OTHER): Payer: Federal, State, Local not specified - PPO

## 2013-03-28 ENCOUNTER — Other Ambulatory Visit (HOSPITAL_BASED_OUTPATIENT_CLINIC_OR_DEPARTMENT_OTHER): Payer: Federal, State, Local not specified - PPO

## 2013-03-29 ENCOUNTER — Ambulatory Visit (HOSPITAL_BASED_OUTPATIENT_CLINIC_OR_DEPARTMENT_OTHER): Payer: Federal, State, Local not specified - PPO | Attending: General Surgery | Admitting: Radiology

## 2013-03-29 DIAGNOSIS — G4733 Obstructive sleep apnea (adult) (pediatric): Secondary | ICD-10-CM

## 2013-04-06 ENCOUNTER — Other Ambulatory Visit: Payer: Self-pay | Admitting: *Deleted

## 2013-04-06 ENCOUNTER — Ambulatory Visit: Payer: Federal, State, Local not specified - PPO | Admitting: *Deleted

## 2013-04-06 MED ORDER — PRAVASTATIN SODIUM 40 MG PO TABS: 40.0000 mg | ORAL_TABLET | Freq: Every day | ORAL | Status: DC

## 2013-04-06 NOTE — Telephone Encounter (Signed)
Fax Received. Refill Completed. Betty [name] (R.M.A)  Pt called wanting refills

## 2013-04-09 NOTE — Progress Notes (Signed)
Supervised Weight Loss Visit: Pre-Operative RYGB Surgery  Medical Nutrition Therapy:  Appt start time: 1100   End time: 1200.  Primary concerns today:  Pre-Operative Bariatric Surgery Supervised Weight Loss. Melena returns with a total wt loss of 5.7 lbs for SWL visit per insurance requirements. See below.  Weight today: 273.9 lbs BMI: 42.9 kg/m^2  24-hr recall: Unable to obtain d/t multiple pt questions. Will attempt at next visit  Medications: No changes reported Supplementation: Calcium carbonate, biotin, beta-carotene, Fish oil, Osteo Bi-Flex Joint Shield   Recent physical activity:  No change from previous  Progress Towards Goal(s):  In progress.  Nutritional Diagnosis:  [place]-3.3 Obesity related to past poor dietary habits and physical inactivity as evidenced by patient attending supervised weight loss for insurance approval of bariatric surgery.    Intervention:  Nutrition education regarding CHO metabolism and risk of gaining fat mass. Discussed less painful options for exercise.  Monitoring/Evaluation:  Dietary intake, exercise, and body weight. Follow up in 1 months for supervised weight loss visit.

## 2013-04-09 NOTE — Patient Instructions (Signed)
   Follow Pre-Op Nutrition Goals to prepare for Gastric Bypass Surgery.  Aim for 15-20 g of carbohydrate per meal/snack  Always have protein with carbs  Aim for 30+ min of physical activity daily - try swimming.    Call the [facility] at [phone] once you have been given your surgery date to enrolled in the Pre-Op Nutrition Class. You will need to attend this nutrition class 3-4 weeks prior to your surgery.

## 2013-04-10 ENCOUNTER — Encounter: Payer: Self-pay | Admitting: Internal Medicine

## 2013-04-10 ENCOUNTER — Ambulatory Visit (INDEPENDENT_AMBULATORY_CARE_PROVIDER_SITE_OTHER): Payer: Federal, State, Local not specified - PPO | Admitting: Internal Medicine

## 2013-04-10 VITALS — BP 118/78 | HR 84 | Temp 98.2°F | Resp 12 | Ht 67.0 in | Wt 276.0 lb

## 2013-04-10 DIAGNOSIS — I10 Essential (primary) hypertension: Secondary | ICD-10-CM

## 2013-04-10 DIAGNOSIS — E1165 Type 2 diabetes mellitus with hyperglycemia: Secondary | ICD-10-CM

## 2013-04-10 DIAGNOSIS — IMO0002 Reserved for concepts with insufficient information to code with codable children: Secondary | ICD-10-CM

## 2013-04-10 DIAGNOSIS — E118 Type 2 diabetes mellitus with unspecified complications: Secondary | ICD-10-CM

## 2013-04-10 LAB — HEMOGLOBIN A1C: Hgb A1c MFr Bld: 9.2 % — ABNORMAL HIGH (ref 4.6–6.5)

## 2013-04-10 MED ORDER — RAMIPRIL 10 MG PO TABS: 10.0000 mg | ORAL_TABLET | Freq: Every day | ORAL | Status: DC

## 2013-04-10 MED ORDER — "INSULIN SYRINGE-NEEDLE U-100 30G X 5/16"" 1 ML MISC": Status: DC

## 2013-04-10 NOTE — Progress Notes (Signed)
Patient ID: Betty [name], female   DOB: [birth year], 57 y.o.   MRN: [id number]  HPI: Betty [name] is a 57 y.o.-year-old female, returning for f/u for DM2, insulin-dependent, uncontrolled, with complications (CAD, peripheral neuropathy). Last visit 1 mo ago.  Patient has long standing diabetes (>10 years), with the last hemoglobin A1c: Lab Results  Component Value Date   HGBA1C 8.8* 01/09/2013  previously 7 mo ago, in the 7's.   Pt is on a regimen of: - Metformin 500 mg bid (increased from daily at last visit) >> was on 2000 mg daily, but had to lower it b/c diarrhea - at last visit we started NPH and Regular insulin as follows: Breakfast lunch dinner bedtime  Insulin N: 30 units - - 30 units  Insulin R: 20 units 20 units 20 units -  Was previously on 70/30 Insulin: 70-20-30 (split in 3 b/c she was dropping her sugars overnight) or 70-50. On these, she was still having lows in am (40s). She was on Novolog in the past but could not afford it. She was also on Amaryl and Avandia.  Pt checks her sugars 1-2x day (we reviewed her log together): - am: 49-235 >> 71-180 now - before lunch: 92-152, one value at 183 (10 days ago) - bedtime: only 1 value: 186 No lows <71 at this visit, although the tactic of taking Regular at bedtime is very worrisome for lows at night! At last visit, she was having lows, usually in am upon waking up. She had to eat a snack at night to avoid dropping her sugars over night. Lowest sugar was 49; she has hypoglycemia awareness at 80.  She will have a gastric bypass surgery soon, having the presurgical nutrition classes.   Pt does not have chronic kidney disease, last BUN/creatinine was:  Lab Results  Component Value Date   BUN 12 01/09/2013   Lab Results  Component Value Date   CREATININE 0.58 01/09/2013  She is on Ramipril - needs a refill.  Last set of lipids: Lab Results  Component Value Date   CHOL 167 01/09/2013   HDL 48 01/09/2013   LDLCALC  93 01/09/2013   TRIG 132 01/09/2013   CHOLHDL 3.5 01/09/2013   Pt's last eye exam was in >1 year (Dr. Senaida Ores in [place]). No DR reportedly. Has numbness and tingling in her legs, was on Lyrica but had to stop due to somnolence.   She also has a history of HL - on cholestyramine, pravastatin 40, omega 3 fish oil; HTN - on Ramipril, Obesity, OSA, OA - on Osteobiflex.  I reviewed pt's medications, allergies, PMH, social hx, family hx and no changes required, except as mentioned above.  ROS: Constitutional: + Fatigue, hot flashes sometimes, poor sleep Eyes: no blurry vision, no xerophthalmia ENT: no sore throat, no nodules palpated in throat, no dysphagia/odynophagia, + hoarseness, + tinnitus Cardiovascular: no CP/SOB/palpitations/swelling in hands and feet Respiratory: no cough/SOB/wheezing  Gastrointestinal: no N/v/d/c Musculoskeletal: + muscle/+ joint aches (knees, shoulders, neck) Skin: no rashes, itching,  hair loss Neurological: no tremors/numbness and tingling in feet/dizziness  PE: BP 118/78  Pulse 84  Temp(Src) 98.2 F (36.8 C) (Oral)  Resp 12  Ht 5\' 7"  (1.702 m)  Wt 276 lb (125.193 kg)  BMI 43.22 kg/m2  SpO2 95% Wt Readings from Last 3 Encounters:  04/10/13 276 lb (125.193 kg)  03/02/13 273 lb 14.4 oz (124.24 kg)  02/28/13 280 lb (127.007 kg)   Constitutional: overweight, in NAD, pale Eyes: PERRLA,  EOMI, no exophthalmos ENT: moist mucous membranes, no thyromegaly, no cervical lymphadenopathy Cardiovascular: RRR, No MRG, mild periankle edema Respiratory: CTA B Gastrointestinal: abdomen soft, NT, ND, BS+ Musculoskeletal: no deformities, strength intact in all 4 Skin: moist, warm, no rashes; thin hair Neurological: no tremor with outstretched hands, DTR normal in all 4  ASSESSMENT: 1. DM2, insulin-dependent, uncontrolled, with complications - CAD - s/p stent 11/2004 - peripheral neuropathy  2. HTN - on Ramipril  PLAN:  1. DM2 Patient with long-standing  insulin-dependent diabetes, previously on the 70/30 insulin regimen, but with fluctuating CBGs in a.m.: lows in the 40s alternating with sugars in the 200s. At last visit,we discussed about the fact that usually widely fluctuating sugars in a.m. So I advised her to split the NPH and regular, to be able to administer the long-acting insulin at bedtime, rather than pre-dinner. - reviewing her detailed log, she continues to take the NPH and Regular insulin together most days a week, but usually at bedtime, sometimes before dinner, too. I explained the risk of taking regular insulin at bedtime, and the fact that varying the time of injection will cause fluctuations in her sugars. I explained that the regular insulin should only be taken before dinner, while the NPH should ideally be taken before bedtime. She agrees to start doing this consistently. - however, overall, her sugars are improved - We will continue the following regimen: Breakfast lunch dinner bedtime  Insulin N: 30 units - - 30 units  Insulin R: 20 units 20 units 20 units -  - sent Rx for syringes to her pharmacy - continue metformin 500 mg twice a day with her meals - given new sugar logs and advised how to fill it and to bring it at next appt - continue to check to 3 times a day, before meals and at bedtime - Return to clinic in 2 months with her sugar log - we'll check a hemoglobin A1c today  2. HTN - good control - refilled Ramipril - if not checked by PCP, will need to check a BMP at next visit. Same with lipids.  Office Visit on 04/10/2013  Component Date Value Range Status  . Hemoglobin A1C 04/10/2013 9.2* 4.6 - 6.5 % Final   Glycemic Control Guidelines for People with Diabetes:Non Diabetic:  <6%Goal of Therapy: <7%Additional Action Suggested:  >8%    HbA1C worse, despite better sugars, I suspect that we are missing most of the highs by not checking frequently enough. Will encourage her to check often and use insulin  correctly.

## 2013-04-10 NOTE — Patient Instructions (Addendum)
Please take the regular insulin before dinner and the NPH at bedtime.  Try to check sugars later in the day, too. Things are looking better! Keep up the good work! Please try to join MyChart - I will send you the HbA1C result through there.

## 2013-04-12 ENCOUNTER — Encounter: Payer: Federal, State, Local not specified - PPO | Attending: General Surgery | Admitting: *Deleted

## 2013-04-12 ENCOUNTER — Encounter: Payer: Self-pay | Admitting: *Deleted

## 2013-04-12 DIAGNOSIS — E669 Obesity, unspecified: Secondary | ICD-10-CM | POA: Insufficient documentation

## 2013-04-12 DIAGNOSIS — Z713 Dietary counseling and surveillance: Secondary | ICD-10-CM | POA: Insufficient documentation

## 2013-04-12 NOTE — Progress Notes (Signed)
Supervised Weight Loss Visit: Pre-Operative RYGB Surgery  Medical Nutrition Therapy:  Appt start time:  445   End time:  500.  Primary concerns today:  Pre-Operative Bariatric Surgery Supervised Weight Loss. Sheilla returns and reports not sleeping well d/t increased hours and stress at work. Is not exercising at this time d/t long hours.   Weight today: 274.9 lbs BMI: 43.1 kg/m^2  Medications: No changes reported Supplementation: Calcium carbonate, biotin, beta-carotene, Fish oil, Osteo Bi-Flex Joint Shield   Recent physical activity:  None  Progress Towards Goal(s):  In progress.  Nutritional Diagnosis:  [place]-3.3 Obesity related to past poor dietary habits and physical inactivity as evidenced by patient attending supervised weight loss for insurance approval of bariatric surgery.    Intervention:  Nutrition education regarding CHO amounts in sweets and stress management, including exercise.   Monitoring/Evaluation:  Dietary intake, exercise, and body weight. Follow up in 1 months for supervised weight loss visit.

## 2013-04-12 NOTE — Patient Instructions (Addendum)
   Follow Pre-Op Nutrition Goals to prepare for Gastric Bypass Surgery.  Aim for 15-20 g of carbohydrate per meal/snack  Always have protein with carbs  Aim for 30+ min of physical activity daily - try swimming or chair exercises.  Will help decrease stress levels  Call the [facility] at [phone] once you have been given your surgery date to enrolled in the Pre-Op Nutrition Class. You will need to attend this nutrition class 3-4 weeks prior to your surgery.

## 2013-04-21 ENCOUNTER — Ambulatory Visit (HOSPITAL_BASED_OUTPATIENT_CLINIC_OR_DEPARTMENT_OTHER): Payer: Federal, State, Local not specified - PPO | Attending: General Surgery

## 2013-04-21 VITALS — Ht 67.0 in | Wt 275.0 lb

## 2013-04-21 DIAGNOSIS — G4700 Insomnia, unspecified: Secondary | ICD-10-CM | POA: Insufficient documentation

## 2013-04-21 DIAGNOSIS — G4733 Obstructive sleep apnea (adult) (pediatric): Secondary | ICD-10-CM

## 2013-04-21 DIAGNOSIS — R5383 Other fatigue: Secondary | ICD-10-CM | POA: Insufficient documentation

## 2013-04-21 DIAGNOSIS — R0609 Other forms of dyspnea: Secondary | ICD-10-CM | POA: Insufficient documentation

## 2013-04-21 DIAGNOSIS — R0989 Other specified symptoms and signs involving the circulatory and respiratory systems: Secondary | ICD-10-CM | POA: Insufficient documentation

## 2013-04-21 DIAGNOSIS — G4730 Sleep apnea, unspecified: Secondary | ICD-10-CM | POA: Insufficient documentation

## 2013-04-21 DIAGNOSIS — Z9989 Dependence on other enabling machines and devices: Secondary | ICD-10-CM

## 2013-04-21 DIAGNOSIS — R61 Generalized hyperhidrosis: Secondary | ICD-10-CM | POA: Insufficient documentation

## 2013-04-21 DIAGNOSIS — R5381 Other malaise: Secondary | ICD-10-CM | POA: Insufficient documentation

## 2013-04-23 DIAGNOSIS — G4700 Insomnia, unspecified: Secondary | ICD-10-CM

## 2013-04-23 DIAGNOSIS — R5383 Other fatigue: Secondary | ICD-10-CM

## 2013-04-23 DIAGNOSIS — R5381 Other malaise: Secondary | ICD-10-CM

## 2013-04-23 DIAGNOSIS — R0609 Other forms of dyspnea: Secondary | ICD-10-CM

## 2013-04-23 DIAGNOSIS — R0989 Other specified symptoms and signs involving the circulatory and respiratory systems: Secondary | ICD-10-CM

## 2013-04-23 DIAGNOSIS — G4730 Sleep apnea, unspecified: Secondary | ICD-10-CM

## 2013-04-24 NOTE — Procedures (Signed)
[name], [name]          ACCOUNT NO.:  [id number]  MEDICAL RECORD NO.:  [id number]          PATIENT TYPE:  OUT  LOCATION:  SLEEP CENTER                 [facility]ACILITY[facility]  [facility]  PHYSICIAN:  Mirayah Wren D. Maple Hudson, MD, FCCP, FACPDATE OF BIRTH:  April 11, 1955  DATE OF STUDY:  04/21/2013                           NOCTURNAL POLYSOMNOGRAM  REFERRING PHYSICIAN:  Mary Sella. Andrey Campanile, MD, FACS  INDICATION FOR STUDY:  Insomnia with sleep apnea.  EPWORTH SLEEPINESS SCORE:  10/24.  BMI 43.1, weight 275 pounds.  Height 67 inches, neck 17 inches.  MEDICATIONS:  Home medications are charted for review.  SLEEP ARCHITECTURE:  The baseline diagnostic NPSG on January 27, 2013, recorded AHI 18.6 per hour.  Body weight was 275 pounds with that study. CPAP titration is now requested.  SLEEP ARCHITECTURE:  Total sleep time 347 minutes with sleep efficiency 88.9%.  Stage I was 2.3%, stage II 73.5%, stage III absent, REM 24.2% of total sleep time.  Sleep latency 1.5 minutes, REM latency 142.5 minutes, awake after sleep onset 43 minutes.  Arousal index 3.13.  Bedtime medication:  Zolpidem.  RESPIRATORY DATA:  CPAP titration protocol.  CPAP was titrated to 11 CWP, AHI 0 per hour.  She wore a medium AirFit F10 full face mask with heated humidifier.  OXYGEN DATA:  Snoring was prevented and mean oxygen saturation held 94.7% on room air with CPAP.  CARDIAC DATA:  Normal sinus rhythm.  MOVEMENT/PARASOMNIA:  No significant movement disturbance.  Bathroom x2.  IMPRESSION/RECOMMENDATION: 1. Successful CPAP titration to 11 CWP, AHI 0 per hour.  She wore a     medium AirFit F10 full face mask with heated humidifier.  Snoring     was prevented and mean oxygen saturation held 94.7% on room air. 2. Baseline diagnostic NPSG on January 27, 2013, had recorded AHI     18.6 per hour, with body weight 275 pounds.     Amma Crear D. Maple Hudson, MD, [facility], FACP Diplomate, American Board of Sleep Medicine    CDY/MEDQ  D:  04/23/2013  10:36:24  T:  04/24/2013 06:43:21  Job:  [id number]

## 2013-04-25 ENCOUNTER — Other Ambulatory Visit: Payer: Self-pay | Admitting: *Deleted

## 2013-04-25 MED ORDER — "INSULIN SYRINGE-NEEDLE U-100 30G X 5/16"" 1 ML MISC": Status: DC

## 2013-05-10 ENCOUNTER — Encounter: Payer: Federal, State, Local not specified - PPO | Admitting: *Deleted

## 2013-05-10 ENCOUNTER — Encounter: Payer: Self-pay | Admitting: *Deleted

## 2013-05-10 VITALS — Ht 67.0 in | Wt 276.6 lb

## 2013-05-10 DIAGNOSIS — E669 Obesity, unspecified: Secondary | ICD-10-CM

## 2013-05-10 NOTE — Patient Instructions (Addendum)
   Follow Pre-Op Nutrition Goals to prepare for Gastric Bypass Surgery.  Aim for 15-20 g of carbohydrate per meal/snack  Always have protein with carbs  Aim for 30+ min of physical activity daily - try swimming or chair exercises.  Will help decrease stress levels  Call the [facility] at [phone] once you have been given your surgery date to enrolled in the Pre-Op Nutrition Class. You will need to attend this nutrition class 3-4 weeks prior to your surgery.    Contact Sherion Godwin at [facility] regarding having to start process over again if you wait until 2015 for surgery and if you need to continue Supervised Weight Loss visits since you have [employer] insurance.  

## 2013-05-10 NOTE — Progress Notes (Signed)
Supervised Weight Loss Visit: Pre-Operative RYGB Surgery  Medical Nutrition Therapy:  Appt start time:  445   End time:  500.  Primary concerns today:  Pre-Operative Bariatric Surgery Supervised Weight Loss. Betty [name] returns and reports she recently completed sleep study, though has not heard results. Is still not exercising at this time d/t long hours. Asks if she will have to start whole process over if she pushes surgery to first of the year (2015). Needs to finish paying [facility], [facility] (program fee), and hospital fees and states she does not have the money right now. Advised her to contact Tressa Busman at [facility] to discuss.  Weight today: 276.6 lbs BMI: 43.3 kg/m^2  Medications: No changes reported Supplementation: Calcium carbonate, biotin, beta-carotene, Fish oil, Osteo Bi-Flex Joint Shield   Recent physical activity:  None  Progress Towards Goal(s):  In progress.  Nutritional Diagnosis:  [place]-3.3 Obesity related to past poor dietary habits and physical inactivity as evidenced by patient attending supervised weight loss for insurance approval of bariatric surgery.    Intervention:  Nutrition education regarding CHO amounts in sweets and stress management, including exercise.   Monitoring/Evaluation:  Dietary intake, exercise, and body weight. Follow up in 1 month for supervised weight loss visit.

## 2013-06-08 ENCOUNTER — Ambulatory Visit: Payer: Federal, State, Local not specified - PPO | Admitting: *Deleted

## 2013-06-15 ENCOUNTER — Ambulatory Visit: Payer: Federal, State, Local not specified - PPO | Admitting: Dietician

## 2013-06-19 ENCOUNTER — Encounter: Payer: Federal, State, Local not specified - PPO | Attending: General Surgery | Admitting: Dietician

## 2013-06-19 VITALS — Ht 67.0 in | Wt 278.0 lb

## 2013-06-19 DIAGNOSIS — Z713 Dietary counseling and surveillance: Secondary | ICD-10-CM | POA: Insufficient documentation

## 2013-06-19 DIAGNOSIS — E669 Obesity, unspecified: Secondary | ICD-10-CM | POA: Insufficient documentation

## 2013-06-19 NOTE — Patient Instructions (Addendum)
   Follow Pre-Op Nutrition Goals to prepare for Gastric Bypass Surgery.  Aim for 15-20 g of carbohydrate per meal/snack  Always have protein with carbs  Aim for 30+ min of physical activity daily - try swimming or chair exercises.  Will help decrease stress levels  Call the [facility] at [phone] once you have been given your surgery date to enrolled in the Pre-Op Nutrition Class. You will need to attend this nutrition class 3-4 weeks prior to your surgery.    Contact Sherion Godwin at [facility] regarding having to start process over again if you wait until 2015 for surgery and if you need to continue Supervised Weight Loss visits since you have [employer].

## 2013-06-19 NOTE — Progress Notes (Signed)
Supervised Weight Loss Visit: Pre-Operative RYGB Surgery  Medical Nutrition Therapy:  Appt start time:  1115   End time:  1130.  Primary concerns today:  Pre-Operative Bariatric Surgery Supervised Weight Loss.Still has not heard about results of sleep study and not exercising much though does have some resistance band. Still interested in postponing surgery until 2015 but has not talk to Sherion at [facility] about it yet.   Weight today: 278 lbs BMI: 43.5 kg/m^2  Medications: No changes reported Supplementation: Calcium carbonate, biotin, beta-carotene, Fish oil, Osteo Bi-Flex Joint Shield   Recent physical activity:  None  Progress Towards Goal(s):  In progress.  Nutritional Diagnosis:  [place]-3.3 Obesity related to past poor dietary habits and physical inactivity as evidenced by patient attending supervised weight loss for insurance approval of bariatric surgery.    Intervention:  Nutrition education regarding CHO amounts in sweets and stress management, including exercise.   Monitoring/Evaluation:  Dietary intake, exercise, and body weight. Follow up in 1 month for supervised weight loss visit.

## 2013-07-03 ENCOUNTER — Other Ambulatory Visit: Payer: Self-pay | Admitting: *Deleted

## 2013-07-03 MED ORDER — METFORMIN HCL 500 MG PO TABS: ORAL_TABLET | ORAL | Status: DC

## 2013-07-03 NOTE — Telephone Encounter (Signed)
Rx refill

## 2013-07-06 ENCOUNTER — Ambulatory Visit: Payer: Federal, State, Local not specified - PPO | Admitting: *Deleted

## 2013-07-11 ENCOUNTER — Encounter (INDEPENDENT_AMBULATORY_CARE_PROVIDER_SITE_OTHER): Payer: Self-pay

## 2013-07-13 ENCOUNTER — Ambulatory Visit: Payer: Federal, State, Local not specified - PPO | Admitting: Dietician

## 2013-07-17 ENCOUNTER — Encounter: Payer: Self-pay | Admitting: *Deleted

## 2013-07-17 ENCOUNTER — Encounter: Payer: Federal, State, Local not specified - PPO | Attending: General Surgery | Admitting: *Deleted

## 2013-07-17 VITALS — Ht 67.0 in | Wt 278.1 lb

## 2013-07-17 DIAGNOSIS — Z713 Dietary counseling and surveillance: Secondary | ICD-10-CM | POA: Insufficient documentation

## 2013-07-17 DIAGNOSIS — E669 Obesity, unspecified: Secondary | ICD-10-CM

## 2013-07-17 NOTE — Progress Notes (Signed)
Supervised Weight Loss Visit: Pre-Operative RYGB Surgery  Medical Nutrition Therapy:  Appt start time:  1230   End time:  1245.  Primary concerns today:  Pre-Operative Bariatric Surgery Supervised Weight Loss. Still interested in postponing surgery until 2015, but has not talked to Sherion at [facility] about it yet. Will also verify that she needs no more SWL visits.   Weight today: 278.1 lbs BMI: 43.5 kg/m^2  Medications: No changes reported Supplementation: Calcium carbonate, biotin, beta-carotene, Fish oil, Osteo Bi-Flex Joint Shield   Recent physical activity:  None  Progress Towards Goal(s):  In progress.  Nutritional Diagnosis:  [place]-3.3 Obesity related to past poor dietary habits and physical inactivity as evidenced by patient attending supervised weight loss for insurance approval of bariatric surgery.    Intervention:  Nutrition education regarding CHO amounts in sweets and stress management, including exercise.   Follow up:  Pt to call and register for Pre-Op Class once notified of surgery date.

## 2013-07-17 NOTE — Patient Instructions (Addendum)
   Follow Pre-Op Nutrition Goals to prepare for Gastric Bypass Surgery.  Aim for 15-20 g of carbohydrate per meal/snack  Always have protein with carbs  Aim for 30+ min of physical activity daily - try swimming or chair exercises.  Will help decrease stress levels  Call the [facility] at [phone] once you have been given your surgery date to enrolled in the Pre-Op Nutrition Class. You will need to attend this nutrition class 3-4 weeks prior to your surgery.    Contact Sherion Godwin at [facility] regarding having to start process over again if you wait until 2015 for surgery and if you need to continue Supervised Weight Loss visits since you have [employer].

## 2013-10-27 ENCOUNTER — Other Ambulatory Visit: Payer: Self-pay | Admitting: *Deleted

## 2013-10-27 MED ORDER — GLUCOSE BLOOD VI STRP: ORAL_STRIP | Status: DC

## 2013-10-27 MED ORDER — METFORMIN HCL 500 MG PO TABS: ORAL_TABLET | ORAL | Status: DC

## 2013-11-29 ENCOUNTER — Other Ambulatory Visit: Payer: Self-pay | Admitting: *Deleted

## 2013-11-30 ENCOUNTER — Other Ambulatory Visit: Payer: Self-pay

## 2013-11-30 MED ORDER — PRAVASTATIN SODIUM 40 MG PO TABS
40.0000 mg | ORAL_TABLET | Freq: Every day | ORAL | Status: DC
Start: 2013-11-30 — End: 2014-03-08

## 2013-12-04 ENCOUNTER — Other Ambulatory Visit: Payer: Self-pay | Admitting: *Deleted

## 2013-12-04 MED ORDER — METFORMIN HCL 500 MG PO TABS: ORAL_TABLET | ORAL | Status: DC

## 2014-01-22 ENCOUNTER — Other Ambulatory Visit: Payer: Self-pay | Admitting: *Deleted

## 2014-01-22 ENCOUNTER — Encounter: Payer: Self-pay | Admitting: Internal Medicine

## 2014-01-22 ENCOUNTER — Ambulatory Visit (INDEPENDENT_AMBULATORY_CARE_PROVIDER_SITE_OTHER): Payer: Federal, State, Local not specified - PPO | Admitting: Internal Medicine

## 2014-01-22 VITALS — BP 124/68 | HR 89 | Temp 97.6°F | Resp 12 | Wt 279.0 lb

## 2014-01-22 DIAGNOSIS — IMO0002 Reserved for concepts with insufficient information to code with codable children: Secondary | ICD-10-CM

## 2014-01-22 DIAGNOSIS — E118 Type 2 diabetes mellitus with unspecified complications: Principal | ICD-10-CM

## 2014-01-22 DIAGNOSIS — E1165 Type 2 diabetes mellitus with hyperglycemia: Secondary | ICD-10-CM

## 2014-01-22 LAB — HEMOGLOBIN A1C: HEMOGLOBIN A1C: 9.1 % — AB (ref 4.6–6.5)

## 2014-01-22 MED ORDER — METFORMIN HCL 500 MG PO TABS: ORAL_TABLET | ORAL | Status: DC

## 2014-01-22 NOTE — Patient Instructions (Addendum)
Continue current diabetes regimen:  Breakfast lunch dinner bedtime  Insulin N: 30 units - - 30 units  Insulin R: 20 units 20 units 20 units -   Continue metformin 500 mg 2x a day with your meals.  Please apply antiinflammatory cream 2x a day on left foot (NSAIDS).   Please return in 1.5 months with sugar log.  Please stop at the lab.

## 2014-01-22 NOTE — Progress Notes (Signed)
Patient ID: Betty FerrierHelen E [name], female   DOB: 09-09-1956, 58 y.o.   MRN: [id number]  HPI: Betty FerrierHelen E [name] is a 58 y.o.-year-old female, returning for f/u for DM2, insulin-dependent, uncontrolled, with complications (CAD, peripheral neuropathy). Last visit 10 mo ago!  Patient has long standing diabetes (>10 years), with the last hemoglobin A1c: Lab Results  Component Value Date   HGBA1C 9.2* 04/10/2013   HGBA1C 8.8* 01/09/2013   Pt is on a regimen of: - Metformin 500 mg bid (was on 2000 mg daily, but had to lower it b/c diarrhea) - NPH and Regular insulin as follows:  Breakfast lunch dinner bedtime  Insulin N: 30 units - - 30 units  Insulin R: 20 units 20 units 20 units -   Was previously on 70/30 Insulin: 70-20-30 (split in 3 b/c she was dropping her sugars overnight) or 70-50. On these, she was still having lows in am (40s). She was on Novolog in the past but could not afford it. She was also on Amaryl and Avandia.  Pt checks her sugars 1-2x day (no log): - am: 49-235 >> 71-180 >> 62-190 (80-130) - before lunch: 92-152, one value at 183 >> n/c - before dinner: 130-160 - bedtime: 186 >> n/c No lows 62; she has hypoglycemia awareness at 80.  She is not sure yet about gastric bypass surgery - has had presurgical nutrition classes.   She is eating better: grilled meats (bought a [employer] grill).  Pt does not have chronic kidney disease, last BUN/creatinine was:  Lab Results  Component Value Date   BUN 12 01/09/2013   Lab Results  Component Value Date   CREATININE 0.58 01/09/2013  She is on Ramipril.  Last set of lipids: Lab Results  Component Value Date   CHOL 167 01/09/2013   HDL 48 01/09/2013   LDLCALC 93 01/09/2013   TRIG 132 01/09/2013   CHOLHDL 3.5 01/09/2013   Pt's last eye exam was in 2013 (Dr. Senaida [place]ardson in [place] No DR reportedly. Cannot take time from work easily so she has not had another one.  Has numbness and tingling in her legs, was on Lyrica  but had to stop due to somnolence. Has swelling in L foot after stretching.  She also has a history of HL - on cholestyramine, pravastatin 40, omega 3 fish oil; HTN - on Ramipril, Obesity, OSA, OA - on Osteobiflex.  I reviewed pt's medications, allergies, PMH, social hx, family hx and no changes required, except as mentioned above.  She tells me that her previous endocrinologist filled out FMLA forms 2/2 uncontrolled DM2, but I cannot fill this since no major highs or lows or other issues.   ROS: Constitutional: + Fatigue,  + poor sleep, + weight loss Eyes: + blurry vision, no xerophthalmia ENT: no sore throat, no nodules palpated in throat, no dysphagia/odynophagia, no hoarseness, + tinnitus Cardiovascular: no CP/SOB/palpitations/+ swelling in hands and feet Respiratory: no cough/SOB/+ wheezing  Gastrointestinal: + N/no v/d/c Musculoskeletal: + muscle/+ joint aches Skin: no rashes, + itching, no  hair loss Neurological: no tremors/numbness and tingling in feet - also swelling in left foot/dizziness  PE: BP 124/68  Pulse 89  Temp(Src) 97.6 F (36.4 C) (Oral)  Resp 12  Wt 279 lb (126.554 kg)  SpO2 98% Wt Readings from Last 3 Encounters:  01/22/14 279 lb (126.554 kg)  07/17/13 278 lb 1.6 oz (126.145 kg)  06/19/13 278 lb (126.1 kg)   Constitutional: overweight, in NAD, pale Eyes: PERRLA, EOMI, no exophthalmos  ENT: moist mucous membranes, no thyromegaly, no cervical lymphadenopathy Cardiovascular: RRR, No MRG, mild periankle edema Respiratory: CTA B Gastrointestinal: abdomen soft, NT, ND, BS+ Musculoskeletal: no deformities, strength intact in all 4 Skin: moist, warm, no rashes; thin hair Neurological: no tremor with outstretched hands, DTR normal in all 4  ASSESSMENT: 1. DM2, insulin-dependent, uncontrolled, with complications - CAD - s/p stent 11/2004 - peripheral neuropathy  2. HTN - on Ramipril  PLAN:  1. DM2 Patient with long-standing insulin-dependent diabetes,  previously on the 70/30 insulin regimen, returning after a long absence. - does not bring sugar log, which makes it difficult to assess her diabetes control.  - I gave new sugar logs and advised her to start checking more often, including at bedtime - We will continue the following regimen: Breakfast lunch dinner bedtime  Insulin N: 30 units - - 30 units  Insulin R: 20 units 20 units 20 units -  - continue metformin 500 mg twice a day with her meals - Return to clinic in 1.6 months with her sugar log - we'll check a hemoglobin A1c today  Office Visit on 01/22/2014  Component Date Value Ref Range Status  . Hemoglobin A1C 01/22/2014 9.1* 4.6 - 6.5 % Final   Glycemic Control Guidelines for People with Diabetes:Non Diabetic:  <6%Goal of Therapy: <7%Additional Action Suggested:  >8%    HbA1C stable, high. Need to see CBG log and decide how to increase the insulins. For now, I will advise her to retry increasing the Metformin to target.

## 2014-01-23 ENCOUNTER — Other Ambulatory Visit: Payer: Self-pay | Admitting: *Deleted

## 2014-01-23 MED ORDER — METFORMIN HCL ER 500 MG PO TB24: ORAL_TABLET | ORAL | Status: DC

## 2014-02-27 ENCOUNTER — Other Ambulatory Visit: Payer: Self-pay | Admitting: *Deleted

## 2014-02-27 MED ORDER — GLUCOSE BLOOD VI STRP: ORAL_STRIP | Status: DC

## 2014-03-06 ENCOUNTER — Other Ambulatory Visit: Payer: Self-pay | Admitting: *Deleted

## 2014-03-06 MED ORDER — INSULIN REGULAR HUMAN 100 UNIT/ML IJ SOLN: INTRAMUSCULAR | Status: DC

## 2014-03-08 ENCOUNTER — Other Ambulatory Visit: Payer: Self-pay

## 2014-03-08 MED ORDER — PRAVASTATIN SODIUM 40 MG PO TABS
40.0000 mg | ORAL_TABLET | Freq: Every day | ORAL | Status: DC
Start: 2014-03-08 — End: 2014-03-14

## 2014-03-09 ENCOUNTER — Ambulatory Visit: Payer: Federal, State, Local not specified - PPO | Admitting: Internal Medicine

## 2014-03-12 ENCOUNTER — Other Ambulatory Visit: Payer: Self-pay | Admitting: *Deleted

## 2014-03-12 ENCOUNTER — Encounter: Payer: Self-pay | Admitting: Internal Medicine

## 2014-03-12 ENCOUNTER — Ambulatory Visit (INDEPENDENT_AMBULATORY_CARE_PROVIDER_SITE_OTHER): Payer: Federal, State, Local not specified - PPO | Admitting: Internal Medicine

## 2014-03-12 VITALS — BP 126/72 | HR 88 | Temp 98.4°F | Resp 12 | Wt 275.0 lb

## 2014-03-12 DIAGNOSIS — IMO0002 Reserved for concepts with insufficient information to code with codable children: Secondary | ICD-10-CM

## 2014-03-12 DIAGNOSIS — E1165 Type 2 diabetes mellitus with hyperglycemia: Secondary | ICD-10-CM

## 2014-03-12 DIAGNOSIS — E118 Type 2 diabetes mellitus with unspecified complications: Principal | ICD-10-CM

## 2014-03-12 MED ORDER — INSULIN NPH (HUMAN) (ISOPHANE) 100 UNIT/ML ~~LOC~~ SUSP: 30.0000 [IU] | Freq: Every day | SUBCUTANEOUS | Status: DC

## 2014-03-12 MED ORDER — INSULIN REGULAR HUMAN 100 UNIT/ML IJ SOLN: INTRAMUSCULAR | Status: DC

## 2014-03-12 NOTE — Progress Notes (Signed)
Patient ID: Betty [name], female   DOB: 09-Aug-1956, 58 y.o.   MRN: [id number]  HPI: Betty [name] is a 58 y.o.-year-old female, returning for f/u for DM2, insulin-dependent, uncontrolled, with complications (CAD, peripheral neuropathy). Last visit 10 mo ago!  Patient has long standing diabetes (>10 years), with the last hemoglobin A1c: Lab Results  Component Value Date   HGBA1C 9.1* 01/22/2014   HGBA1C 9.2* 04/10/2013   HGBA1C 8.8* 01/09/2013   Pt is on a regimen of: - Metformin 500 mg tid (was on 2000 mg daily, but had to lower it b/c diarrhea) - NPH and Regular insulin as follows:  Breakfast lunch dinner bedtime  Insulin N: 30 units - - 30 units  Insulin R: 20 units 20 units 20 units -   Was previously on 70/30 Insulin: 70-20-30 (split in 3 b/c she was dropping her sugars overnight) or 70-50. On these, she was still having lows in am (40s). She was on Novolog in the past but could not afford it. She was also on Amaryl and Avandia.  Pt checks her sugars 1-2x day (review log): - am: 49-235 >> 71-180 >> 62-190 (80-130) >> (74)-170s (200x1) - most 100-140 - before lunch: 92-152, one value at 183 >> n/c >> (75) 90-168 (200) - before dinner: 130-160 >> 91-157 - bedtime: 186 >> n/c >> 171, had 200-243 before improving her diet and increasing Metformin No lows - lowest 75; she has hypoglycemia awareness at 80.  She is not sure yet about gastric bypass surgery - has had presurgical nutrition classes.   She is eating better: grilled meats (bought a [employer]).  Pt does not have chronic kidney disease, last BUN/creatinine was:  Lab Results  Component Value Date   BUN 12 01/09/2013   Lab Results  Component Value Date   CREATININE 0.58 01/09/2013  She is on Ramipril.  Last set of lipids: Lab Results  Component Value Date   CHOL 167 01/09/2013   HDL 48 01/09/2013   LDLCALC 93 01/09/2013   TRIG 132 01/09/2013   CHOLHDL 3.5 01/09/2013   Pt's last eye exam was in  2013 (Dr. Senaida Ores in [place]). No DR reportedly. Cannot take time from work easily so she has not had another one.  Has numbness and tingling in her legs, was on Lyrica but had to stop due to somnolence.   She also has a history of HL - on cholestyramine, pravastatin 40, omega 3 fish oil; HTN - on Ramipril, Obesity, OSA, OA - on Osteobiflex.  I reviewed pt's medications, allergies, PMH, social hx, family hx and no changes required, except as mentioned above.  ROS: Constitutional: + Fatigue,  + poor sleep Eyes: no blurry vision, no xerophthalmia ENT: no sore throat, no nodules palpated in throat, no dysphagia/odynophagia, no hoarseness, + tinnitus Cardiovascular: no CP/SOB/palpitations/+ swelling in hands and feet Respiratory: no cough/SOB/+ wheezing  Gastrointestinal: + N/no v/d/c Musculoskeletal: + muscle/+ joint aches Skin: no rashes, no itching, no  hair loss Neurological: no tremors/numbness and tingling/dizziness, + HA  PE: BP 126/72  Pulse 88  Temp(Src) 98.4 F (36.9 C) (Oral)  Resp 12  Wt 275 lb (124.739 kg)  SpO2 94% Wt Readings from Last 3 Encounters:  03/12/14 275 lb (124.739 kg)  01/22/14 279 lb (126.554 kg)  07/17/13 278 lb 1.6 oz (126.145 kg)   Constitutional: overweight, in NAD, pale Eyes: PERRLA, EOMI, no exophthalmos ENT: moist mucous membranes, no thyromegaly, no cervical lymphadenopathy Cardiovascular: RRR, No MRG, mild periankle  edema Respiratory: CTA B Gastrointestinal: abdomen soft, NT, ND, BS+ Musculoskeletal: no deformities, strength intact in all 4 Skin: moist, warm, no rashes; thin hair Neurological: no tremor with outstretched hands, DTR normal in all 4  ASSESSMENT: 1. DM2, insulin-dependent, uncontrolled, with complications - CAD - s/p stent 11/2004 - peripheral neuropathy  She tells me that her previous endocrinologist filled out FMLA forms 2/2 uncontrolled DM2, but I cannot fill this since no major highs or lows or other issues.   2.  HTN - on Ramipril  PLAN:  1. DM2 Patient with long-standing insulin-dependent diabetes, previously on the 70/30 insulin regimen, with improved control after changing her diet and increasing her Metformin doses in the last 2 weeks - I gave new sugar logs    Patient Instructions   - Please change the R insulin dose as follows: Breakfast lunch dinner bedtime  Insulin N: 30 units - - 30 units  Insulin R: 15 units with a                       smaller meal                  20 units with a            regular meal 15 units with a smaller meal 20 units with a larger meal 15 units with a smaller meal 20 units with a larger meal   - continue metformin 500 mg 3x a day with meals - will refill the N insulin - strongly advised to have a new eye exam - Return to clinic in 1.5 months with her sugar log  2. HTN - BP great today - continue current regimen; Ramipril 10 mg daily

## 2014-03-12 NOTE — Patient Instructions (Signed)
-   Please change the R insulin dose as follows: Breakfast lunch dinner bedtime  Insulin N: 30 units - - 30 units  Insulin R: 15 units with a                       smaller meal                  20 units with a            regular meal 15 units with a smaller meal 20 units with a larger meal 15 units with a smaller meal 20 units with a larger meal   - continue metformin 500 mg 3x a day with meals  Please return in 2 months with your sugar log.

## 2014-03-14 ENCOUNTER — Other Ambulatory Visit: Payer: Self-pay

## 2014-03-14 MED ORDER — PRAVASTATIN SODIUM 40 MG PO TABS: 40.0000 mg | ORAL_TABLET | Freq: Every day | ORAL | Status: DC

## 2014-04-02 ENCOUNTER — Other Ambulatory Visit: Payer: Self-pay | Admitting: *Deleted

## 2014-04-02 MED ORDER — PRAVASTATIN SODIUM 40 MG PO TABS: 40.0000 mg | ORAL_TABLET | Freq: Every day | ORAL | Status: DC

## 2014-04-05 ENCOUNTER — Other Ambulatory Visit: Payer: Self-pay | Admitting: *Deleted

## 2014-04-05 MED ORDER — INSULIN NPH (HUMAN) (ISOPHANE) 100 UNIT/ML ~~LOC~~ SUSP: SUBCUTANEOUS | Status: DC

## 2014-04-18 LAB — HM DIABETES EYE EXAM

## 2014-04-23 ENCOUNTER — Encounter: Payer: Self-pay | Admitting: Cardiovascular Disease

## 2014-04-23 ENCOUNTER — Encounter: Payer: Self-pay | Admitting: Internal Medicine

## 2014-05-14 ENCOUNTER — Encounter: Payer: Self-pay | Admitting: Internal Medicine

## 2014-05-14 ENCOUNTER — Ambulatory Visit (INDEPENDENT_AMBULATORY_CARE_PROVIDER_SITE_OTHER): Payer: Federal, State, Local not specified - PPO | Admitting: Internal Medicine

## 2014-05-14 ENCOUNTER — Other Ambulatory Visit: Payer: Self-pay | Admitting: *Deleted

## 2014-05-14 VITALS — BP 122/74 | HR 93 | Temp 97.8°F | Ht 67.0 in | Wt 273.0 lb

## 2014-05-14 DIAGNOSIS — IMO0002 Reserved for concepts with insufficient information to code with codable children: Secondary | ICD-10-CM

## 2014-05-14 DIAGNOSIS — E118 Type 2 diabetes mellitus with unspecified complications: Principal | ICD-10-CM

## 2014-05-14 DIAGNOSIS — E1165 Type 2 diabetes mellitus with hyperglycemia: Secondary | ICD-10-CM

## 2014-05-14 MED ORDER — GABAPENTIN 300 MG PO CAPS: 300.0000 mg | ORAL_CAPSULE | Freq: Three times a day (TID) | ORAL | Status: DC

## 2014-05-14 NOTE — Patient Instructions (Addendum)
Please continue the current regimen but inject the R insulin 30 min before a meal. Please stop at the lab. Please return in 1.5 month with your sugar log.

## 2014-05-14 NOTE — Progress Notes (Signed)
Patient ID: Betty FerrierHelen E Sockwell, female   DOB: [birth year], 58 y.o.   MRN: [id number]  HPI: Betty [name] is a 58 y.o. female, returning for f/u for DM2, dx >10 years ago, insulin-dependent, uncontrolled, with complications (CAD, peripheral neuropathy). Last visit 2 mo ago.  She still has a lot of stress at work.  Last hemoglobin A1c: Lab Results  Component Value Date   HGBA1C 9.1* 01/22/2014   HGBA1C 9.2* 04/10/2013   HGBA1C 8.8* 01/09/2013   Pt is on a regimen of: - Metformin 500 mg tid (was on 2000 mg daily, but had to lower it b/c diarrhea) - NPH and Regular insulin as follows - she may forget during the day: Breakfast  Injects 1-1.5 h before she eats Lunch Right as as she eats Dinner Injects 2-2:30 h before she eats bedtime  Insulin N: 30 units - - 30 units  Insulin R: 15 units with a                       smaller meal                  20 units with a            regular meal 15 units with a smaller meal 20 units with a larger meal 15 units with a smaller meal 20 units with a larger meal    Was previously on 70/30 Insulin: 70-20-30 (split in 3 b/c she was dropping her sugars overnight) or 70-50. On these, she was still having lows in am (40s). She was on Novolog in the past but could not afford it. She was also on Amaryl and Avandia.  Pt checks her sugars 3x day (reviewed log) >> sugars are higher, and few lows: - am: 49-235 >> 71-180 >> 62-190 (80-130) >> (74)-170s (200x1) - most 100-140 >> 90-248 - 2h after lunch:  >> 145, 215 - before lunch: 92-152, one value at 183 >> n/c >> (75) 90-168 (200) >> [phone],114-215 - 2h after lunch: 151, 199 - before dinner: 130-160 >> 91-157 >> [phone] - bedtime: 186 >> n/c >> 171, had 200-243 before improving her diet and increasing Metformin >> 180, 279 No lows - lowest 69; she has hypoglycemia awareness at 80.  She is not sure yet about gastric bypass surgery - has had presurgical nutrition classes.   Pt does not have chronic kidney  disease, last BUN/creatinine was:  Lab Results  Component Value Date   BUN 12 01/09/2013   Lab Results  Component Value Date   CREATININE 0.58 01/09/2013  She is on Ramipril.  Last set of lipids: Lab Results  Component Value Date   CHOL 167 01/09/2013   HDL 48 01/09/2013   LDLCALC 93 01/09/2013   TRIG 132 01/09/2013   CHOLHDL 3.5 01/09/2013   Pt's last eye exam was: Abstract on 04/23/2014  Component Date Value Ref Range Status  . HM Diabetic Eye Exam 04/18/2014 No Retinopathy  No Retinopathy Final  - Has numbness and tingling in her legs, was on Lyrica but had to stop due to somnolence. Now on Neurontin 300 mg tid >> would like me to refill it.  She also has a history of HL - on cholestyramine, pravastatin 40, omega 3 fish oil; HTN - on Ramipril, Obesity, OSA, OA - on Osteobiflex.  I reviewed pt's medications, allergies, PMH, social hx, family hx and no changes required, except as mentioned above.  ROS: Constitutional: + Fatigue, +  poor sleep Eyes: + blurry vision, no xerophthalmia ENT: no sore throat, no nodules palpated in throat, no dysphagia/odynophagia, no hoarseness, + tinnitus Cardiovascular: no CP/SOB/palpitations/+ swelling in hands and feet Respiratory: no cough/SOB/+ wheezing  Gastrointestinal: + N/no v/+ d/c Musculoskeletal: + muscle/+ joint aches Skin: no rashes, no itching, no  hair loss Neurological: no tremors/numbness and tingling/dizziness, + HA  PE: BP 122/74  Pulse 93  Temp(Src) 97.8 F (36.6 C) (Oral)  Ht 5\' 7"  (1.702 m)  Wt 273 lb (123.832 kg)  BMI 42.75 kg/m2  SpO2 94% Wt Readings from Last 3 Encounters:  05/14/14 273 lb (123.832 kg)  03/12/14 275 lb (124.739 kg)  01/22/14 279 lb (126.554 kg)   Constitutional: obese class 3, in NAD, pale Eyes: PERRLA, EOMI, no exophthalmos ENT: moist mucous membranes, no thyromegaly, no cervical lymphadenopathy Cardiovascular: RRR, No MRG, mild periankle edema Respiratory: CTA B Gastrointestinal: abdomen  soft, NT, ND, BS+ Musculoskeletal: no deformities, strength intact in all 4 Skin: moist, warm, no rashes; thin hair Neurological: no tremor with outstretched hands, DTR normal in all 4  ASSESSMENT: 1. DM2, insulin-dependent, uncontrolled, with complications - CAD - s/p stent 11/2004 - peripheral neuropathy She told me that her previous endocrinologist filled out FMLA forms 2/2 uncontrolled DM2, but I cannot fill this since no major highs or lows or other issues.   2. HTN - on Ramipril  PLAN:  1. DM2 Patient with long-standing insulin-dependent diabetes, with again fluctuating sugars. Possible problems: - she missed insulin doses - yesterday missed the R completely - the injections are unrelated to meals - i advised her to: Patient Instructions  Please continue the current regimen but inject the R insulin 30 min before a meal. Please stop at the lab. Please return in 1.5 month with your sugar log.  - up to date with eye exams - check Hba1c today - I congratulated her as she continues to lose weight - refilled Neurontin 300 mg tid for her neuropathy - Return to clinic in 1.5 months with her sugar log  2. HTN - BP great today: 122/74 - continue current regimen; Ramipril 10 mg daily  Office Visit on 05/14/2014  Component Date Value Ref Range Status  . Hemoglobin A1C 05/14/2014 8.9* 4.6 - 6.5 % Final   Glycemic Control Guidelines for People with Diabetes:Non Diabetic:  <6%Goal of Therapy: <7%Additional Action Suggested:  >8%    HbA1c a little better.

## 2014-05-15 LAB — HEMOGLOBIN A1C: Hgb A1c MFr Bld: 8.9 % — ABNORMAL HIGH (ref 4.6–6.5)

## 2014-05-31 ENCOUNTER — Ambulatory Visit: Payer: Federal, State, Local not specified - PPO | Admitting: Cardiovascular Disease

## 2014-06-01 ENCOUNTER — Ambulatory Visit: Payer: Federal, State, Local not specified - PPO | Admitting: Cardiovascular Disease

## 2014-06-07 ENCOUNTER — Other Ambulatory Visit: Payer: Self-pay | Admitting: Cardiovascular Disease

## 2014-06-25 ENCOUNTER — Other Ambulatory Visit: Payer: Self-pay | Admitting: *Deleted

## 2014-06-25 MED ORDER — RAMIPRIL 10 MG PO CAPS: 10.0000 mg | ORAL_CAPSULE | Freq: Every day | ORAL | Status: DC

## 2014-07-05 ENCOUNTER — Ambulatory Visit: Payer: Federal, State, Local not specified - PPO | Admitting: Internal Medicine

## 2014-07-11 ENCOUNTER — Encounter: Payer: Self-pay | Admitting: Cardiovascular Disease

## 2014-07-11 ENCOUNTER — Ambulatory Visit (INDEPENDENT_AMBULATORY_CARE_PROVIDER_SITE_OTHER): Payer: Federal, State, Local not specified - PPO | Admitting: Cardiovascular Disease

## 2014-07-11 VITALS — BP 140/80 | HR 78 | Ht 67.0 in | Wt 274.0 lb

## 2014-07-11 DIAGNOSIS — R0989 Other specified symptoms and signs involving the circulatory and respiratory systems: Secondary | ICD-10-CM

## 2014-07-11 DIAGNOSIS — I2510 Atherosclerotic heart disease of native coronary artery without angina pectoris: Secondary | ICD-10-CM

## 2014-07-11 DIAGNOSIS — R0609 Other forms of dyspnea: Secondary | ICD-10-CM

## 2014-07-11 DIAGNOSIS — E785 Hyperlipidemia, unspecified: Secondary | ICD-10-CM

## 2014-07-11 DIAGNOSIS — R0600 Dyspnea, unspecified: Secondary | ICD-10-CM

## 2014-07-11 MED ORDER — PRAVASTATIN SODIUM 40 MG PO TABS: ORAL_TABLET | ORAL | Status: DC

## 2014-07-11 NOTE — Assessment & Plan Note (Signed)
Recurrent exertional dyspnea which was her anginal equivalent.  ECG with LVH and unable to walk well on treadmill  F/U lexiscan myovue

## 2014-07-11 NOTE — Assessment & Plan Note (Signed)
Still trying to get funds for bariatric surgery consultation

## 2014-07-11 NOTE — Patient Instructions (Signed)
Your physician recommends that you continue on your current medications as directed. Please refer to the Current Medication list given to you today.  Your physician has requested that you have a lexiscan myoview. For further information please visit [email or web address]. Please follow instruction sheet, as given.   Your physician wants you to follow-up in: 1 year You will receive a reminder letter in the mail two months in advance. If you don't receive a letter, please call our office to schedule the follow-up appointment.   

## 2014-07-11 NOTE — Assessment & Plan Note (Signed)
Cholesterol is at goal.  Continue current dose of statin and diet Rx.  No myalgias or side effects.  F/U  LFT's in 6 months. Lab Results  Component Value Date   LDLCALC 93 01/09/2013   Refill for mevacor called into [facility]

## 2014-07-11 NOTE — Assessment & Plan Note (Signed)
Well controlled.  Continue current medications and low sodium Dash type diet.    

## 2014-07-11 NOTE — Progress Notes (Signed)
Patient ID: Betty [name], female   DOB: [birth year], 58 y.o.   MRN: [id number] Betty [name] is a 58 year old patient of Betty [name] with a history of coronary artery disease, status PTCA and stenting of an LAD lesion in February 2006 with Cypher drug-eluting stent. She had a Myoview in January 2007 for recurrent chest pain and showed an EF of 58% with normal perfusion. The rest of the medical history is notable for morbid obesity, diabetes, hypertension, hyperlipidemia, and sleep apnea.  She has been intolerant of most  STATINS due to myalgias.  She returns today for yearly followup. She is doing very well. Her functional capacity is limited mostly by arthritis in her back. Blood sugar control improving - following with Betty [name]    Tolerating pravastatin at 40mg    Having some exertional dyspnea This was her anginal equivalent when she was in [place] and diagnosed with LAD stenosis Exertional not resting Has not taken nitro  ROS: Denies fever, malais, weight loss, blurry vision, decreased visual acuity, cough, sputum, SOB, hemoptysis, pleuritic pain, palpitaitons, heartburn, abdominal pain, melena, [name] extremity edema, claudication, or rash.  All other systems reviewed and negative  General: Affect appropriate Overweight white female HEENT: normal Neck supple with no adenopathy JVP normal no bruits no thyromegaly Lungs clear with no wheezing and good diaphragmatic motion Heart:  S1/S2 no murmur, no rub, gallop or click PMI normal Abdomen: benighn, BS positve, no tenderness, no AAA no bruit.  No HSM or HJR Distal pulses intact with no bruits No edema Neuro non-focal Skin warm and dry No muscular weakness   Current Outpatient Prescriptions  Medication Sig Dispense Refill  . aspirin 81 MG tablet Take 81 mg by mouth daily.        . B-D UF III MINI PEN NEEDLES 31G X 5 MM MISC       . beta carotene 40981 UNIT capsule Take 25,000 Units by mouth daily.        . Biotin 5000  MCG TABS Take 1 tablet by mouth daily.       . Calcium Carbonate-Vitamin D (CALCIUM-VITAMIN D) 600-200 MG-UNIT CAPS Take by mouth.        . cetirizine (ZYRTEC) 10 MG tablet Take 10 mg by mouth daily.        . diphenhydrAMINE (BENADRYL) 25 mg capsule Take 25 mg by mouth every 6 (six) hours as needed for itching.      . gabapentin (NEURONTIN) 300 MG capsule Take 1 capsule (300 mg total) by mouth 3 (three) times daily.  90 capsule  3  . glucose blood test strip Test blood sugar 5 times daily as instructed  150 each  2  . insulin NPH Human (NOVOLIN N RELION) 100 UNIT/ML injection Inject 30 units into the skin twice daily. First dose 30 min before breakfast and second dose before bedtime.  20 mL  3  . insulin regular (NOVOLIN R,HUMULIN R) 100 units/mL injection ReliOn: Inject under skin 20 units, 30 min before each meal.THREE TIMES A DAY      . Insulin Syringe-Needle U-100 (RELION INSULIN SYR 1CC/30G) 30G X 5/16" 1 ML MISC Use 4x a day  360 each  2  . lidocaine (XYLOCAINE) 2 % solution       . Magnesium 250 MG TABS Take 1 tablet by mouth daily.        . metFORMIN (GLUCOPHAGE-XR) 500 MG 24 hr tablet 500 mg two times daily 3 times daily, if tolerated.      Marland Kitchen  Misc Natural Products (OSTEO BI-FLEX JOINT SHIELD PO) Take 1 tablet by mouth daily.        . Omega-3 Fatty Acids (FISH OIL CONCENTRATE PO) Take 1 tablet by mouth daily.        . pravastatin (PRAVACHOL) 40 MG tablet TAKE 1 TABLET BY MOUTH ONCE DAILY  30 tablet  0  . ramipril (ALTACE) 10 MG capsule Take 1 capsule (10 mg total) by mouth daily.  90 capsule  3   No current facility-administered medications for this visit.    Allergies  Lyrica  Electrocardiogram:  SR rate 78 limb lead voltage for LVH   Assessment and Plan

## 2014-07-11 NOTE — Assessment & Plan Note (Signed)
Discussed low carb diet.  Target hemoglobin A1c is 6.5 or less.  Continue current medications.  

## 2014-07-12 ENCOUNTER — Ambulatory Visit: Payer: Federal, State, Local not specified - PPO | Admitting: Cardiovascular Disease

## 2014-07-12 ENCOUNTER — Telehealth: Payer: Self-pay | Admitting: Internal Medicine

## 2014-07-12 NOTE — Telephone Encounter (Signed)
Called pt and advised her per Dr Gherghe's note. Pt understood.  

## 2014-07-12 NOTE — Telephone Encounter (Signed)
If she is eating that am, how is she NPO after midnight ?? She needs to re-check with them. If she is eating that am, she can take the insulin normally, just not take the one with lunch that day.

## 2014-07-12 NOTE — Telephone Encounter (Signed)
Patient states that she is suppose to have a cardio infusion  Patient is suppose to be NPO after midnight no medication Will it be ok for her to not take her insulin   Please advise   Thank you

## 2014-07-12 NOTE — Telephone Encounter (Signed)
Please read note below and advise.  

## 2014-07-12 NOTE — Telephone Encounter (Signed)
Called Betty [name] to advise her per Dr Charlean SanfilippoGherghe's note. Betty [name] stated that her procedure is not until 12:30 pm. She was advised by the nurse that it was ok to take am insulin and eat. Please advise if these instructions are correct.

## 2014-07-12 NOTE — Telephone Encounter (Signed)
Do not take NPH the night before the procedure and also, do not take any insulin in the am of the procedure.

## 2014-07-16 ENCOUNTER — Other Ambulatory Visit: Payer: Self-pay | Admitting: *Deleted

## 2014-07-16 MED ORDER — METFORMIN HCL ER 500 MG PO TB24: ORAL_TABLET | ORAL | Status: DC

## 2014-07-19 ENCOUNTER — Encounter (HOSPITAL_COMMUNITY): Payer: Federal, State, Local not specified - PPO

## 2014-07-23 ENCOUNTER — Ambulatory Visit: Payer: Federal, State, Local not specified - PPO | Admitting: Internal Medicine

## 2014-07-24 ENCOUNTER — Ambulatory Visit (HOSPITAL_COMMUNITY): Payer: Federal, State, Local not specified - PPO | Attending: Internal Medicine | Admitting: Radiology

## 2014-07-24 VITALS — BP 127/62 | Ht 67.0 in | Wt 272.0 lb

## 2014-07-24 DIAGNOSIS — I2510 Atherosclerotic heart disease of native coronary artery without angina pectoris: Secondary | ICD-10-CM

## 2014-07-24 DIAGNOSIS — R0609 Other forms of dyspnea: Secondary | ICD-10-CM | POA: Diagnosis not present

## 2014-07-24 DIAGNOSIS — E119 Type 2 diabetes mellitus without complications: Secondary | ICD-10-CM | POA: Insufficient documentation

## 2014-07-24 DIAGNOSIS — R0600 Dyspnea, unspecified: Secondary | ICD-10-CM

## 2014-07-24 DIAGNOSIS — R002 Palpitations: Secondary | ICD-10-CM | POA: Diagnosis not present

## 2014-07-24 DIAGNOSIS — I10 Essential (primary) hypertension: Secondary | ICD-10-CM | POA: Insufficient documentation

## 2014-07-24 MED ORDER — TECHNETIUM TC 99M SESTAMIBI GENERIC - CARDIOLITE
33.0000 | Freq: Once | INTRAVENOUS | Status: AC | PRN
  Administered 2014-07-24: 33 via INTRAVENOUS

## 2014-07-24 MED ORDER — REGADENOSON 0.4 MG/5ML IV SOLN
0.4000 mg | Freq: Once | INTRAVENOUS | Status: AC
  Administered 2014-07-24: 0.4 mg via INTRAVENOUS

## 2014-07-24 NOTE — Progress Notes (Signed)
[facility] SITE 3 NUCLEAR MED [address] [place], [place] [address] [phone]    Cardiology Nuclear Med Study  Ammie FerrierHelen E [name] is a 58 y.o. female     MRN : [id number]     DOB: 04-Jan-1956  Procedure Date: 07/24/2014  Nuclear Med Background Indication for Stress Test:  Evaluation for Ischemia History:  CAD, '07 MPI: NL EF: 58% Cardiac Risk Factors: Hypertension and IDDM  Symptoms:  DOE and Palpitations   Nuclear Pre-Procedure Caffeine/Decaff Intake:  10:30pm NPO After: 8:30am   Lungs:  clear O2 Sat: 95% on room air. IV 0.9% NS with Angio Cath:  22g  IV Site: R Hand  IV Started by:  Cathlyn Parsonsynthia Hasspacher, RN  Chest Size (in):  46 Cup Size: C  Height: 5\' 7"  (1.702 m)  Weight:  272 lb (123.378 kg)  BMI:  Body mass index is 42.59 kg/(m^2). Tech Comments:  n/a    Nuclear Med Study 1 or 2 day study: 2 day  Stress Test Type:  Lexiscan  Reading MD: n/a  Order Authorizing Provider:  Burna CashPeter Nishan,MD  Resting Radionuclide: Technetium 3655m Sestamibi  Resting Radionuclide Dose: 33.0 mCi  On     07-25-14  Stress Radionuclide:  Technetium 8155m Sestamibi  Stress Radionuclide Dose: 33.0 mCi  On        07-24-14          Stress Protocol Rest HR: 70 Stress HR: 83  Rest BP: 127/62 Stress BP: 152/67  Exercise Time (min): n/a METS: n/a   Predicted Max HR: 161 bpm % Max HR: 51.55 bpm Rate Pressure Product: [id number]   Dose of Adenosine (mg):  n/a Dose of Lexiscan: 0.4 mg  Dose of Atropine (mg): n/a Dose of Dobutamine: n/a mcg/kg/min (at max HR)  Stress Test Technologist: Milana NaSabrina Williams, EMT-P  Nuclear Technologist:  Jackquline BoschElzbieta Kubak,CNMT     Rest Procedure:  Myocardial perfusion imaging was performed at rest 45 minutes following the intravenous administration of Technetium 4055m Sestamibi. Rest ECG: NSR - Normal EKG  Stress Procedure:  The patient received IV Lexiscan 0.4 mg over 15-seconds.  Technetium 4055m Sestamibi injected at 30-seconds. This patient had sob and was  flushed with the Lexiscan injection. Quantitative spect images were obtained after a 45 minute delay. Stress ECG: No significant change from baseline ECG  QPS Raw Data Images:  Mild breast attenuation.  Normal left ventricular size. Stress Images:  There is decreased uptake in the apex. Rest Images:  There is decreased uptake in the apex. Subtraction (SDS):  small, subtle fixed defect in the apex that most likely represents breast attenuation artifact.  The polar plot SDS is 5 but visually this appears as a small defect that is fixed Transient Ischemic Dilatation (Normal <1.22):  1.09 Lung/Heart Ratio (Normal <0.45):  0.28  Quantitative Gated Spect Images QGS EDV:  104 ml QGS ESV:  46 ml  Impression Exercise Capacity:  Lexiscan with no exercise. BP Response:  Normal blood pressure response. Clinical Symptoms:  There is dyspnea. ECG Impression:  There are scattered PVCs. Comparison with Prior Nuclear Study: No significant change from previous study  Overall Impression:  Low risk stress nuclear study Small sized, subtle fixed defect in the apex most consistent with breast attenuation artifact..  LV Ejection Fraction: 56%.  LV Wall Motion:  NL LV Function; NL Wall Motion  Signed: Armanda Magicraci Turner, MD [facility] 07/25/2014

## 2014-07-25 ENCOUNTER — Ambulatory Visit (HOSPITAL_COMMUNITY): Payer: Federal, State, Local not specified - PPO

## 2014-07-25 DIAGNOSIS — R0989 Other specified symptoms and signs involving the circulatory and respiratory systems: Secondary | ICD-10-CM

## 2014-07-25 MED ORDER — TECHNETIUM TC 99M SESTAMIBI GENERIC - CARDIOLITE
33.0000 | Freq: Once | INTRAVENOUS | Status: AC | PRN
  Administered 2014-07-25: 33 via INTRAVENOUS

## 2014-07-27 ENCOUNTER — Telehealth: Payer: Self-pay | Admitting: *Deleted

## 2014-07-27 NOTE — Telephone Encounter (Signed)
Ammie FerrierLeonguerrero, Jacky E ','<More Detail >>       Wendall StadePeter C Nishan, MD       Sent: Thu July 26, 2014 2:53 PM    To: Alois ClicheChristine E Dwana Garin, LPN                   Message     Normal myovue                       Myocardial Perfusion Imaging (Order [id number])      Myocardial Perfusion Imaging   Status: Edited Result - FINAL Visible to patient: This result is not viewable by the patient. Next appt: 08/21/2014 at 04:15 PM in Internal Medicine [facility] Lennox(GHERGHE, Silvestre MesiRISTINA, MD) Dx: Atherosclerosis of native coronary ar...          Last Resulted: 07/26/14 7:40 AM           Results      Scan on 07/26/2014 11:51 AM by Jonetta OsgoodShanda L Bridgeforth : [place] Image and Information - [facility] on 07/26/2014 11:51 AM by Rayburn FeltShanda L Bridgeforth : [place] Image and Information - [facility]                Order Questions     Question Answer Comment    Where should this test be performed [facility]([address]     Type of stress Lexiscan     Patient weight in lbs 274                 Reviewed by List     Wendall StadePeter C Nishan, MD on 07/26/2014 2:53 PM          Encounter   Result Information    View Encounter  Status      Edited Result - FINAL (07/26/2014 7:40 AM)      Provider Status: Reviewed               Order-Level Documents:     There are no order-level documents.         Order Myocardial Perfusion Imaging [[id number]] (Order [id number])       Order Providers     Authorizing Encounter Billing    Lia HoppingPeter C Nishan Wanda H Deal Wendall StadePeter C Nishan           Original Order     Ordered On Ordered By      Wed Jul 11, 2014 9:52 AM Jarvis NewcomerLisa S Parris-Godley, CMA                  Associated Diagnoses       ICD-9-CM ICD-10-CM    Atherosclerosis of native coronary artery of native heart without angina pectoris - Primary  414.01 I25.10    Dyspnea  786.09 R06.00           Comments     Dx CAD, Exertional dyspnea          Order  Questions     Question Answer Comment    Where should this test be performed [facility]([address]     Type of stress Lexiscan     Patient weight in lbs 274            Appointments for this Order     07/24/2014 12:30 PM - 15 min Farris HasWanda H Deal [facility] Nuclear Med           Additional Information  Associated Reports    View Encounter    Priority and Order Details          PT  AWARE OF MYOVIEW RESULTS .Zack Seal/CY

## 2014-08-21 ENCOUNTER — Other Ambulatory Visit (INDEPENDENT_AMBULATORY_CARE_PROVIDER_SITE_OTHER): Payer: Federal, State, Local not specified - PPO | Admitting: *Deleted

## 2014-08-21 ENCOUNTER — Encounter: Payer: Self-pay | Admitting: Internal Medicine

## 2014-08-21 ENCOUNTER — Ambulatory Visit (INDEPENDENT_AMBULATORY_CARE_PROVIDER_SITE_OTHER): Payer: Federal, State, Local not specified - PPO | Admitting: Internal Medicine

## 2014-08-21 VITALS — BP 164/84 | HR 84 | Temp 98.4°F | Resp 12 | Wt 277.0 lb

## 2014-08-21 DIAGNOSIS — E1165 Type 2 diabetes mellitus with hyperglycemia: Secondary | ICD-10-CM

## 2014-08-21 DIAGNOSIS — Z23 Encounter for immunization: Secondary | ICD-10-CM

## 2014-08-21 DIAGNOSIS — IMO0002 Reserved for concepts with insufficient information to code with codable children: Secondary | ICD-10-CM

## 2014-08-21 DIAGNOSIS — E118 Type 2 diabetes mellitus with unspecified complications: Secondary | ICD-10-CM

## 2014-08-21 NOTE — Progress Notes (Signed)
Patient ID: Betty [name], female   DOB: 21-Apr-1956, 58 y.o.   MRN: [id number]  HPI: Betty [name] is a 58 y.o. female, returning for f/u for DM2, dx >10 years ago, insulin-dependent, uncontrolled, with complications (CAD, peripheral neuropathy). Last visit 3 mo ago.  She tells me she is very stressed and busy at work.Tells me she can forgets her meds because of this, and she cannot remember her sugar values (no log, no meter)...  Last hemoglobin A1c: Lab Results  Component Value Date   HGBA1C 8.9* 05/14/2014   HGBA1C 9.1* 01/22/2014   HGBA1C 9.2* 04/10/2013   Pt is on a regimen of: - Metformin 500 mg tid (was on 2000 mg daily, but had to lower it b/c diarrhea) >> may be forgetting her metformin occasionally - NPH and Regular insulin as follows - she may forget during the day: Breakfast  Lunch  Dinner bedtime  Insulin N: 30 units - - 30 units  Insulin R: 15 units with a                       smaller meal                  20 units with a            regular meal 15 units with a smaller meal 20 units with a larger meal 15 units with a smaller meal 20 units with a larger meal    Was previously on 70/30 Insulin: 70-20-30 (split in 3 b/c she was dropping her sugars overnight) or 70-50. On these, she was still having lows in am (40s). She was on Novolog in the past but could not afford it. She was also on Amaryl and Avandia.  Pt checks her sugars 1-3x day (no meter, no log) - fluctuating: - am: 49-235 >> 71-180 >> 62-190 (80-130) >> (74)-170s (200x1) - most 100-140 >> 90-248 >> 100-150, occasionally higher - 2h after b'fast:  >> 145, 215 >> n/c - before lunch: 92-152, one value at 183 >> n/c >> (75) 90-168 (200) >> [phone],114-215 >> cannot remember - 2h after lunch: 151, 199 >> n/c - before dinner: 130-160 >> 91-157 >> [phone] >> cannot remember - bedtime: 186 >> n/c >> 171, had 200-243 before improving her diet and increasing Metformin >> 180, 279 >> cannot remember Some  lows, 60s; she has hypoglycemia awareness at 80.  She is not sure yet about gastric bypass surgery - has had presurgical nutrition classes.   Pt does not have chronic kidney disease, last BUN/creatinine was:  Lab Results  Component Value Date   BUN 12 01/09/2013   Lab Results  Component Value Date   CREATININE 0.58 01/09/2013  She is on Ramipril.  Last set of lipids: Lab Results  Component Value Date   CHOL 167 01/09/2013   HDL 48 01/09/2013   LDLCALC 93 01/09/2013   TRIG 132 01/09/2013   CHOLHDL 3.5 01/09/2013   Pt's last eye exam was: Abstract on 04/23/2014  Component Date Value Ref Range Status  . HM Diabetic Eye Exam 04/18/2014 No Retinopathy  No Retinopathy Final  - Has numbness and tingling in her legs, was on Lyrica but had to stop due to somnolence. Now on Neurontin 300 mg tid.  She also has a history of HL - on cholestyramine, pravastatin 40, omega 3 fish oil (managed by cardiology) ; HTN - on Ramipril; Obesity, OSA, OA - on Osteobiflex.  I reviewed  pt's medications, allergies, PMH, social hx, family hx and no changes required, except as mentioned above.  ROS: Constitutional: + Fatigue, + poor sleep Eyes: no blurry vision, no xerophthalmia ENT: no sore throat, no nodules palpated in throat, no dysphagia/odynophagia, no hoarseness Cardiovascular: no CP/SOB/palpitations/swelling in hands and feet Respiratory: no cough/SOB/wheezing  Gastrointestinal: no N/V/D/C Musculoskeletal: + muscle/+ joint aches Skin: no rashes, no itching, no  hair loss Neurological: no tremors/numbness and tingling/dizziness  PE: There were no vitals taken for this visit. Wt Readings from Last 3 Encounters:  07/24/14 272 lb (123.378 kg)  07/11/14 274 lb (124.286 kg)  05/14/14 273 lb (123.832 kg)   Constitutional: obese class 3, in NAD, pale Eyes: PERRLA, EOMI, no exophthalmos ENT: moist mucous membranes, no thyromegaly, no cervical lymphadenopathy Cardiovascular: RRR, No MRG, mild  periankle edema Respiratory: CTA B Gastrointestinal: abdomen soft, NT, ND, BS+ Musculoskeletal: no deformities, strength intact in all 4 Skin: moist, warm, no rashes; thin hair Neurological: no tremor with outstretched hands, DTR normal in all 4  ASSESSMENT: 1. DM2, insulin-dependent, uncontrolled, with complications - CAD - s/p stent 11/2004 - peripheral neuropathy She told me that her previous endocrinologist filled out FMLA forms 2/2 uncontrolled DM2, but I cannot fill this since no major highs or lows or other issues.   PLAN:  1. DM2 Patient with long-standing insulin-dependent diabetes, with again fluctuating sugars, but she does not bring a meter or a log and cannot remember values... In this case, it is impossible to make changes in her regimen - she misses insulin doses - too busy - she can also miss metformin - discussed with her the importance of checking sugars and avoiding hyperglycemia, discussed possible long term consequences. - Iadvised her to: Patient Instructions   Please stop at the lab.  Please continue: Breakfast  Lunch  Dinner bedtime  Insulin N: 30 units - - 30 units  Insulin R: 15 units with a                           smaller meal                  20 units with a                          regular meal 15 units with a smaller   meal 20 units with a larger   meal 15 units with a smaller meal 20 units with a larger meal               Also, continue Metformin 500 mg 3x a day.   Please return in 1.5 month with your sugar log.   - up to date with eye exams - given more sugar logs - check Hba1c today along with Lipids and CMP - I advised her to establish care with a PCP (previous PCP moved) - will give flu vaccine - Return to clinic in 1.5 months with her sugar log  - time spent with the patient: 25 min, of which >50% was spent in trying to convince her the importance of checking her sugars and taking her meds as advised. See topics discussed  above.  Office Visit on 08/21/2014  Component Date Value Ref Range Status  . Sodium 08/21/2014 137  135 - 145 mEq/L Final  . Potassium 08/21/2014 4.2  3.5 - 5.1 mEq/L Final  . Chloride 08/21/2014 101  96 - 112 mEq/L  Final  . CO2 08/21/2014 28  19 - 32 mEq/L Final  . Glucose, Bld 08/21/2014 185* 70 - 99 mg/dL Final  . BUN 16/07/9603 13  6 - 23 mg/dL Final  . Creatinine, Ser 08/21/2014 0.7  0.4 - 1.2 mg/dL Final  . Total Bilirubin 08/21/2014 0.6  0.2 - 1.2 mg/dL Final  . Alkaline Phosphatase 08/21/2014 81  39 - 117 U/L Final  . AST 08/21/2014 21  0 - 37 U/L Final  . ALT 08/21/2014 23  0 - 35 U/L Final  . Total Protein 08/21/2014 7.3  6.0 - 8.3 g/dL Final  . Albumin 54/06/8118 3.3* 3.5 - 5.2 g/dL Final  . Calcium 14/78/2956 9.3  8.4 - 10.5 mg/dL Final  . GFR 21/30/8657 97.19  >60.00 mL/min Final  . Hgb A1c MFr Bld 08/21/2014 8.6* 4.6 - 6.5 % Final   Glycemic Control Guidelines for People with Diabetes:Non Diabetic:  <6%Goal of Therapy: <7%Additional Action Suggested:  >8%   . Cholesterol 08/21/2014 167  0 - 200 mg/dL Final   ATP III Classification       Desirable:  < 200 mg/dL               Borderline High:  200 - 239 mg/dL          High:  > = 846 mg/dL  . Triglycerides 08/21/2014 266.0* 0.0 - 149.0 mg/dL Final   Normal:  <962 mg/dLBorderline High:  150 - 199 mg/dL  . HDL 08/21/2014 38.60* >39.00 mg/dL Final  . VLDL 95/28/4132 53.2* 0.0 - 40.0 mg/dL Final  . Total CHOL/HDL Ratio 08/21/2014 4   Final                  Men          Women1/2 Average Risk     3.4          3.3Average Risk          5.0          4.42X Average Risk          9.6          7.13X Average Risk          15.0          11.0                      . NonHDL 08/21/2014 128.40   Final   NOTE:  Non-HDL goal should be 30 mg/dL higher than patient's LDL goal (i.e. LDL goal of < 70 mg/dL, would have non-HDL goal of < 100 mg/dL)  . Direct LDL 08/21/2014 100.9   Final   Optimal:  <100 mg/dLNear or Above Optimal:  100-129  mg/dLBorderline High:  130-159 mg/dLHigh:  160-189 mg/dLVery High:  >190 mg/dL   Hemoglobin G4W improved. LDL close to goal. Triglycerides higher than before. Normal GFR.

## 2014-08-21 NOTE — Patient Instructions (Addendum)
Please stop at the lab.  Please continue: Breakfast  Lunch  Dinner bedtime  Insulin N: 30 units - - 30 units  Insulin R: 15 units with a                           smaller meal                  20 units with a                          regular meal 15 units with a smaller   meal 20 units with a larger   meal 15 units with a smaller meal 20 units with a larger meal   Also, continue Metformin 500 mg 3x a day.   Please return in 1.5 month with your sugar log.

## 2014-08-22 LAB — LIPID PANEL
CHOL/HDL RATIO: 4
CHOLESTEROL: 167 mg/dL (ref 0–200)
HDL: 38.6 mg/dL — ABNORMAL LOW (ref >39.00)
NONHDL: 128.4
Triglycerides: 266 mg/dL — ABNORMAL HIGH (ref 0.0–149.0)
VLDL: 53.2 mg/dL — AB (ref 0.0–40.0)

## 2014-08-22 LAB — COMPREHENSIVE METABOLIC PANEL
ALBUMIN: 3.3 g/dL — AB (ref 3.5–5.2)
ALK PHOS: 81 U/L (ref 39–117)
ALT: 23 U/L (ref 0–35)
AST: 21 U/L (ref 0–37)
BUN: 13 mg/dL (ref 6–23)
CALCIUM: 9.3 mg/dL (ref 8.4–10.5)
CO2: 28 mEq/L (ref 19–32)
Chloride: 101 mEq/L (ref 96–112)
Creatinine, Ser: 0.7 mg/dL (ref 0.4–1.2)
GFR: 97.19 mL/min (ref >60.00)
Glucose, Bld: 185 mg/dL — ABNORMAL HIGH (ref 70–99)
POTASSIUM: 4.2 meq/L (ref 3.5–5.1)
Sodium: 137 mEq/L (ref 135–145)
Total Bilirubin: 0.6 mg/dL (ref 0.2–1.2)
Total Protein: 7.3 g/dL (ref 6.0–8.3)

## 2014-08-22 LAB — LDL CHOLESTEROL, DIRECT: Direct LDL: 100.9 mg/dL

## 2014-08-22 LAB — HEMOGLOBIN A1C: Hgb A1c MFr Bld: 8.6 % — ABNORMAL HIGH (ref 4.6–6.5)

## 2014-08-28 ENCOUNTER — Encounter: Payer: Self-pay | Admitting: Pulmonary Disease

## 2014-09-01 ENCOUNTER — Other Ambulatory Visit: Payer: Self-pay | Admitting: Internal Medicine

## 2014-10-02 ENCOUNTER — Ambulatory Visit: Payer: Federal, State, Local not specified - PPO | Admitting: Internal Medicine

## 2014-10-12 LAB — HM DIABETES EYE EXAM

## 2014-10-23 ENCOUNTER — Ambulatory Visit: Payer: Federal, State, Local not specified - PPO | Admitting: Internal Medicine

## 2014-11-05 ENCOUNTER — Other Ambulatory Visit: Payer: Self-pay | Admitting: *Deleted

## 2014-11-05 MED ORDER — METFORMIN HCL ER 500 MG PO TB24: ORAL_TABLET | ORAL | Status: DC

## 2014-11-05 NOTE — Telephone Encounter (Signed)
Re-sent refill, clarified dosage.

## 2014-11-06 ENCOUNTER — Other Ambulatory Visit: Payer: Self-pay | Admitting: *Deleted

## 2014-11-06 MED ORDER — METFORMIN HCL ER 500 MG PO TB24: ORAL_TABLET | ORAL | Status: DC

## 2014-11-09 ENCOUNTER — Other Ambulatory Visit: Payer: Self-pay | Admitting: Internal Medicine

## 2014-11-12 ENCOUNTER — Other Ambulatory Visit: Payer: Self-pay | Admitting: *Deleted

## 2014-11-12 MED ORDER — GLUCOSE BLOOD VI STRP: ORAL_STRIP | Status: DC

## 2014-11-16 ENCOUNTER — Ambulatory Visit (INDEPENDENT_AMBULATORY_CARE_PROVIDER_SITE_OTHER): Payer: Federal, State, Local not specified - PPO | Admitting: Internal Medicine

## 2014-11-16 ENCOUNTER — Other Ambulatory Visit: Payer: Self-pay | Admitting: Internal Medicine

## 2014-11-16 ENCOUNTER — Encounter: Payer: Self-pay | Admitting: Internal Medicine

## 2014-11-16 VITALS — BP 142/88 | HR 96 | Temp 98.2°F | Resp 14 | Wt 279.0 lb

## 2014-11-16 DIAGNOSIS — IMO0002 Reserved for concepts with insufficient information to code with codable children: Secondary | ICD-10-CM

## 2014-11-16 DIAGNOSIS — E1165 Type 2 diabetes mellitus with hyperglycemia: Secondary | ICD-10-CM

## 2014-11-16 DIAGNOSIS — E118 Type 2 diabetes mellitus with unspecified complications: Secondary | ICD-10-CM

## 2014-11-16 LAB — HEMOGLOBIN A1C
Hgb A1c MFr Bld: 9.1 % — ABNORMAL HIGH (ref <5.7)
Mean Plasma Glucose: 214 mg/dL — ABNORMAL HIGH (ref <117)

## 2014-11-16 NOTE — Patient Instructions (Addendum)
Please continue current regimen, but: Breakfast  Lunch  Dinner bedtime  Insulin N: 30 units -  - 30 units  Insulin R: 15 units with a  smaller meal                  20 units wiith a  regular meal 15 units with a smaller meal 20 units with a larger meal 15 units with a smaller meal 20 units with a larger meal  NO R insulin   If you want to mix N and R together in the evening, please take this before dinner, not at bedtime. Insulin R should only be taken before a meal.  Please return in 1.5 month with your sugar log.   Please stop at the lab.

## 2014-11-16 NOTE — Progress Notes (Signed)
Patient ID: Betty [name], female   DOB: 1955-11-04, 59 y.o.   MRN: [id number]  HPI: Betty [name] is a 59 y.o. female, returning for f/u for DM2, dx >10 years ago, insulin-dependent, uncontrolled, with complications (CAD, peripheral neuropathy). Last visit 3 mo ago.  She is very stressed and busy at work. She talks mostly about what happens at work and it was difficult to extract information about her diabetes at this visit. She can forget her meds because of the situation at work.  Last hemoglobin A1c: Lab Results  Component Value Date   HGBA1C 8.6* 08/21/2014   HGBA1C 8.9* 05/14/2014   HGBA1C 9.1* 01/22/2014   Pt is on a regimen of: - Metformin 500 mg tid (was on 2000 mg daily, but had to lower it b/c diarrhea) >> may be forgetting her metformin occasionally - NPH and Regular insulin as follows - she may forget during the day: Breakfast  Lunch  Dinner bedtime  Insulin N: 30 units - - 30 units  Insulin R: 15 units with a                       smaller meal                  20 units with a            regular meal 15 units with a smaller meal 20 units with a larger meal 15 units with a smaller meal 20 units with a larger meal  (takes this at bedtime)    Was previously on 70/30 Insulin: 70-20-30 (split in 3 b/c she was dropping her sugars overnight) or 70-50. On these, she was still having lows in am (40s). She was on Novolog in the past but could not afford it. She was also on Amaryl and Avandia.  Pt checks her sugars 1-3x day (brings log) - fluctuating: - am: (74)-170s (200x1) - most 100-140 >> 90-248 >> 100-150, occasionally higher >> 118-216 - 2h after b'fast:  >> 145, 215 >> n/c - before lunch:(75) 90-168 (200) >> [phone] >> 115-195, 223 - 2h after lunch: 151, 199 >> n/c - before dinner: 130-160 >> 91-157 >> [phone] >> 152 - bedtime: 180, 279 >> n/c No lows, 60s; she has hypoglycemia awareness at 80.  She is not sure yet about gastric bypass surgery - has  had presurgical nutrition classes.   Pt does not have chronic kidney disease, last BUN/creatinine was:  Lab Results  Component Value Date   BUN 13 08/21/2014   Lab Results  Component Value Date   CREATININE 0.7 08/21/2014  She is on Ramipril.  Last set of lipids: Lab Results  Component Value Date   CHOL 167 08/21/2014   HDL 38.60* 08/21/2014   LDLCALC 93 01/09/2013   LDLDIRECT 100.9 08/21/2014   TRIG 266.0* 08/21/2014   CHOLHDL 4 08/21/2014   Pt's last eye exam was: Component Date Value  . HM Diabetic Eye Exam Dr Senaida Ores 10/2014 No Retinopathy   - Has numbness and tingling in her legs, was on Lyrica but had to stop due to somnolence. Now on Neurontin 300 mg tid. She had an exacerbation recently.  She also has a history of HL - on cholestyramine, pravastatin 40, omega 3 fish oil (managed by cardiology) ; HTN - on Ramipril; Obesity, OSA, OA - on Osteobiflex.  I reviewed pt's medications, allergies, PMH, social hx, family hx, and changes were documented in the history of present  illness. Otherwise, unchanged from my initial visit note.  ROS: Constitutional: + Fatigue, + poor sleep, + excessive urination Eyes: no blurry vision, no xerophthalmia ENT: no sore throat, no nodules palpated in throat, no dysphagia/odynophagia, no hoarseness Cardiovascular: no CP/SOB/palpitations/+ swelling in hands and feet Respiratory: no cough/SOB/wheezing  Gastrointestinal: + N/no V/+ D/+ C Musculoskeletal: + muscle/+ joint aches Skin: no rashes, no itching, no  hair loss Neurological: no tremors/numbness and tingling/dizziness  PE: BP 142/88 mmHg  Pulse 96  Temp(Src) 98.2 F (36.8 C) (Oral)  Resp 14  Wt 279 lb (126.554 kg)  SpO2 95% Wt Readings from Last 3 Encounters:  11/16/14 279 lb (126.554 kg)  08/21/14 277 lb (125.646 kg)  07/24/14 272 lb (123.378 kg)   Constitutional: obese class 3, in NAD, pale Eyes: PERRLA, EOMI, no exophthalmos ENT: moist mucous membranes, no  thyromegaly, no cervical lymphadenopathy Cardiovascular: RRR, No MRG, mild periankle edema Respiratory: CTA B Gastrointestinal: abdomen soft, NT, ND, BS+ Musculoskeletal: no deformities, strength intact in all 4 Skin: moist, warm, no rashes; thin hair Neurological: no tremor with outstretched hands, DTR normal in all 4  ASSESSMENT: 1. DM2, insulin-dependent, uncontrolled, with complications - CAD - s/p stent 11/2004 - peripheral neuropathy She told me that her previous endocrinologist filled out FMLA forms 2/2 uncontrolled DM2, but I cannot fill this since no major highs or lows or other issues.   PLAN:  1. DM2 Patient with long-standing insulin-dependent diabetes, with again fluctuating sugars. She is taking both N and R at bedtime >> sugars fluctuating in am. She also misses insulin doses - too busy at work. - Iadvised her to: Patient Instructions   Please continue current regimen, but: Breakfast  Lunch  Dinner bedtime  Insulin N: 30 units -  - 30 units  Insulin R: 15 units with a  smaller meal                  20 units wiith a  regular meal 15 units with a smaller meal 20 units with a larger meal 15 units with a smaller meal 20 units with a larger meal  NO R insulin   If you want to mix N and R together in the evening, please take this before dinner, not at bedtime. Insulin R should only be taken before a meal.  Please return in 1.5 month with your sugar log.   Please stop at the lab.  - up to date with eye exams - given more sugar logs - check Hba1c today  - we gave her the flu vaccine at last visit - Return to clinic in 1.5 months with her sugar log  Orders Only on 11/16/2014  Component Date Value Ref Range Status  . Hgb A1c MFr Bld 11/16/2014 9.1* <5.7 % Final   Comment:                                                                        According to the ADA Clinical Practice Recommendations for 2011, when HbA1c is used as a screening test:     >=6.5%    Diagnostic of Diabetes Mellitus            (if abnormal result is confirmed)   5.7-6.4%  Increased risk of developing Diabetes Mellitus   References:Diagnosis and Classification of Diabetes Mellitus,Diabetes Care,2011,34(Suppl 1):S62-S69 and Standards of Medical Care in         Diabetes - 2011,Diabetes Care,2011,34 (Suppl 1):S11-S61.     . Mean Plasma Glucose 11/16/2014 214* <117 mg/dL Final  ION6E higher >> see plan above.Needs to start taking the insulin correctly.

## 2014-12-28 ENCOUNTER — Ambulatory Visit (INDEPENDENT_AMBULATORY_CARE_PROVIDER_SITE_OTHER): Payer: Federal, State, Local not specified - PPO | Admitting: Internal Medicine

## 2014-12-28 ENCOUNTER — Encounter: Payer: Self-pay | Admitting: Internal Medicine

## 2014-12-28 VITALS — BP 132/62 | HR 89 | Temp 97.8°F | Resp 14 | Wt 277.4 lb

## 2014-12-28 DIAGNOSIS — E1165 Type 2 diabetes mellitus with hyperglycemia: Secondary | ICD-10-CM

## 2014-12-28 DIAGNOSIS — E118 Type 2 diabetes mellitus with unspecified complications: Secondary | ICD-10-CM

## 2014-12-28 DIAGNOSIS — IMO0002 Reserved for concepts with insufficient information to code with codable children: Secondary | ICD-10-CM

## 2014-12-28 NOTE — Patient Instructions (Addendum)
Please continue: - Metformin 500 mg 3x a day - NPH and Regular insulin as follows Breakfast  Lunch  Dinner bedtime  Insulin N: 30 units -  - 30 units  Insulin R: 17 units with a  smaller meal                  22 units wiith a  regular meal 17 units with a smaller meal 22 units with a larger meal 17 units with a smaller meal 22 units with a larger meal  -  Metformin 500 mg 500 mg  500 mg -   Please schedule an appt with Oran ReinLaura Jobe (nutrition).

## 2014-12-28 NOTE — Progress Notes (Signed)
Patient ID: Betty [name], female   DOB: 06-Nov-1955, 59 y.o.   MRN: [id number]  HPI: Betty [name] is a 59 y.o. female, returning for f/u for DM2, dx >10 years ago, insulin-dependent, uncontrolled, with complications (CAD, peripheral neuropathy). Last visit 1.5 mo ago.  She is very stressed and busy at work.   She has abdominal pain especially at night.  She also has toe pain.  Last hemoglobin A1c: Lab Results  Component Value Date   HGBA1C 9.1* 11/16/2014   HGBA1C 8.6* 08/21/2014   HGBA1C 8.9* 05/14/2014   Pt is on a regimen of: - Metformin XR 500 mg tid (was on 2000 mg daily, but had to lower it b/c diarrhea) - NPH and Regular insulin as follows Breakfast  Lunch  Dinner bedtime  Insulin N: 30 units -  - 30 units  Insulin R: 15 units with a  smaller meal                  20 units wiith a  regular meal 15 units with a smaller meal 20 units with a larger meal 15 units with a smaller meal 20 units with a larger meal  -   Was previously on 70/30 Insulin: 70-20-30 (split in 3 b/c she was dropping her sugars overnight) or 70-50. On these, she was still having lows in am (40s). She was on Novolog in the past but could not afford it.  She was also on Amaryl and Avandia.  Pt checks her sugars 1-3x day (brings log) - fluctuating, but ~ same as before: - am: 90-248 >> 100-150, occasionally higher >> 118-216 >> 124-214 - 2h after b'fast:  >> 145, 215 >> n/c - before lunch:(75) 90-168 (200) >> [phone] >> 115-195, 223 >> 157-215 - 2h after lunch: 151, 199 >> n/c - before dinner: 130-160 >> 91-157 >> 96-145-239 >> 152 >> 85, 109-172, 212 - bedtime: 180, 279 >> n/c No lows, 85; she has hypoglycemia awareness at 80.  She is not sure yet about gastric bypass surgery - has had presurgical nutrition classes.   Pt does not have chronic kidney disease, last BUN/creatinine was:  Lab Results  Component Value Date   BUN 13 08/21/2014   Lab Results  Component Value Date   CREATININE 0.7 08/21/2014  She is on Ramipril.  Last set of lipids: Lab Results  Component Value Date   CHOL 167 08/21/2014   HDL 38.60* 08/21/2014   LDLCALC 93 01/09/2013   LDLDIRECT 100.9 08/21/2014   TRIG 266.0* 08/21/2014   CHOLHDL 4 08/21/2014   Pt's last eye exam was: Component Date Value  . HM Diabetic Eye Exam Dr Senaida Ores 10/2014 No Retinopathy   - Has numbness and tingling in her legs, was on Lyrica but had to stop due to somnolence. Now on Neurontin 300 mg tid. She had an exacerbation recently.  She also has a history of HL - on cholestyramine, pravastatin 40, omega 3 fish oil (managed by cardiology) ; HTN - on Ramipril; Obesity, OSA, OA - on Osteobiflex.  I reviewed pt's medications, allergies, PMH, social hx, family hx, and changes were documented in the history of present illness. Otherwise, unchanged from my initial visit note.  ROS: Constitutional: + Fatigue, + poor sleep, + excessive urination Eyes: no blurry vision, no xerophthalmia ENT: no sore throat, no nodules palpated in throat, no dysphagia/odynophagia, no hoarseness Cardiovascular: no CP/SOB/palpitations/+ swelling in hands and feet Respiratory: no cough/SOB/wheezing  Gastrointestinal: + N/no V/D/C Musculoskeletal: no muscle/+  joint aches Skin: no rashes, no itching, no  hair loss Neurological: no tremors/numbness and tingling/dizziness  PE: BP 132/62 mmHg  Pulse 89  Temp(Src) 97.8 F (36.6 C) (Oral)  Resp 14  Wt 277 lb 6.4 oz (125.828 kg)  SpO2 97% Wt Readings from Last 3 Encounters:  12/28/14 277 lb 6.4 oz (125.828 kg)  11/16/14 279 lb (126.554 kg)  08/21/14 277 lb (125.646 kg)   Constitutional: obese class 3, in NAD, pale Eyes: PERRLA, EOMI, no exophthalmos ENT: moist mucous membranes, no thyromegaly, no cervical lymphadenopathy Cardiovascular: RRR, No MRG, mild periankle edema Respiratory: CTA B Gastrointestinal: abdomen soft, NT, ND, BS+ Musculoskeletal: no deformities, strength  intact in all 4 Skin: moist, warm, no rashes; thin hair Neurological: no tremor with outstretched hands, DTR normal in all 4  ASSESSMENT: 1. DM2, insulin-dependent, uncontrolled, with complications - CAD - s/p stent 11/2004 - peripheral neuropathy She told me that her previous endocrinologist filled out FMLA forms 2/2 uncontrolled DM2, but I cannot fill this since no major highs or lows or other issues.   PLAN:  1. DM2 Patient with long-standing insulin-dependent diabetes, with again fluctuating sugars. She is taking both N and R at bedtime >> sugars fluctuating in am. She does not miss as many insulin doses now. Sugars still high, but she also has lows >> we are limited in her regimen >> I believe she would benefit from nutrition advice >> referred her to nutrition. Will increase the R insulin just a little. Will move the bedtime Metformin at lunchtime. - Iadvised her to: Patient Instructions   Please continue: - Metformin 500 mg 3x a day - NPH and Regular insulin as follows Breakfast  Lunch  Dinner bedtime  Insulin N: 30 units -  - 30 units  Insulin R: 17 units with a  smaller meal                  22 units wiith a  regular meal 17 units with a smaller meal 22 units with a larger meal 17 units with a smaller meal 22 units with a larger meal  -  Metformin 500 mg 500 mg  500 mg -   Please schedule an appt with Oran ReinLaura Jobe (nutrition).  - up to date with eye exams - given more sugar logs - check Hba1c at next visit - Return to clinic in 2 months with her sugar log

## 2015-01-17 ENCOUNTER — Encounter: Payer: Federal, State, Local not specified - PPO | Admitting: Dietician

## 2015-01-22 ENCOUNTER — Other Ambulatory Visit: Payer: Self-pay | Admitting: Internal Medicine

## 2015-02-14 ENCOUNTER — Other Ambulatory Visit: Payer: Self-pay | Admitting: *Deleted

## 2015-02-14 MED ORDER — METFORMIN HCL ER 500 MG PO TB24: ORAL_TABLET | ORAL | Status: DC

## 2015-02-21 ENCOUNTER — Other Ambulatory Visit: Payer: Self-pay | Admitting: Internal Medicine

## 2015-03-01 ENCOUNTER — Ambulatory Visit: Payer: Federal, State, Local not specified - PPO | Admitting: Internal Medicine

## 2015-04-09 ENCOUNTER — Other Ambulatory Visit: Payer: Self-pay | Admitting: Internal Medicine

## 2015-04-27 ENCOUNTER — Other Ambulatory Visit: Payer: Self-pay | Admitting: Internal Medicine

## 2015-05-02 ENCOUNTER — Other Ambulatory Visit: Payer: Self-pay | Admitting: Internal Medicine

## 2015-05-02 ENCOUNTER — Other Ambulatory Visit (INDEPENDENT_AMBULATORY_CARE_PROVIDER_SITE_OTHER): Payer: Federal, State, Local not specified - PPO | Admitting: *Deleted

## 2015-05-02 ENCOUNTER — Encounter: Payer: Self-pay | Admitting: Internal Medicine

## 2015-05-02 ENCOUNTER — Ambulatory Visit (INDEPENDENT_AMBULATORY_CARE_PROVIDER_SITE_OTHER): Payer: Federal, State, Local not specified - PPO | Admitting: Internal Medicine

## 2015-05-02 VITALS — BP 126/62 | HR 88 | Temp 97.8°F | Resp 12 | Wt 277.0 lb

## 2015-05-02 DIAGNOSIS — E1165 Type 2 diabetes mellitus with hyperglycemia: Secondary | ICD-10-CM

## 2015-05-02 DIAGNOSIS — E118 Type 2 diabetes mellitus with unspecified complications: Secondary | ICD-10-CM

## 2015-05-02 DIAGNOSIS — IMO0002 Reserved for concepts with insufficient information to code with codable children: Secondary | ICD-10-CM

## 2015-05-02 LAB — POCT GLYCOSYLATED HEMOGLOBIN (HGB A1C): HEMOGLOBIN A1C: 9.1

## 2015-05-02 MED ORDER — INSULIN REGULAR HUMAN 100 UNIT/ML IJ SOLN: INTRAMUSCULAR | Status: DC

## 2015-05-02 MED ORDER — INSULIN NPH (HUMAN) (ISOPHANE) 100 UNIT/ML ~~LOC~~ SUSP: SUBCUTANEOUS | Status: DC

## 2015-05-02 NOTE — Patient Instructions (Addendum)
  Please increase: - NPH and Regular insulin as follows Breakfast  Lunch  Dinner bedtime  Insulin N: 34 units -  - 34 units  Insulin R: 20 units with a  smaller meal                 24 units wiith a  regular meal 20 units with a smaller meal 24 units with a larger meal 20 units with a smaller meal 24 units with a larger meal  -  Metformin 500 mg 500 mg  500 mg -   Please come back for a follow-up appointment in 3 months.

## 2015-05-02 NOTE — Progress Notes (Signed)
Patient ID: Betty [name], female   DOB: 02-Nov-1955, 59 y.o.   MRN: [id number]  HPI: Betty [name] is a 59 y.o. female, returning for f/u for DM2, dx >10 years ago, insulin-dependent, uncontrolled, with complications (CAD, peripheral neuropathy). Last visit 4 mo ago. She will establish care with Dr. Bonnye Fava.  She had ocular shingles last month.  Last hemoglobin A1c: Lab Results  Component Value Date   HGBA1C 9.1* 11/16/2014   HGBA1C 8.6* 08/21/2014   HGBA1C 8.9* 05/14/2014   Pt is on a regimen of: - Metformin XR 500 mg tid (was on 2000 mg daily, but had to lower it b/c diarrhea) - NPH and Regular insulin as follows Breakfast  Lunch  Dinner bedtime  Insulin N: 30 units -  - 30 units  Insulin R: 15 units with a  smaller meal                  20 units wiith a  regular meal 15 units with a smaller meal 20 units with a larger meal 15 units with a smaller meal 20 units with a larger meal  -   Was previously on 70/30 Insulin: 70-20-30 (split in 3 b/c she was dropping her sugars overnight) or 70-50. On these, she was still having lows in am (40s). She was on Novolog in the past but could not afford it.  She was also on Amaryl and Avandia.  Pt checks her sugars 1-3x day (brings log) - fluctuating, but appear higher: - am: 90-248 >> 100-150, occasionally higher >> 118-216 >> 124-214 >> 158-263 - 2h after b'fast:  >> 145, 215 >> n/c - before lunch:(75) 90-168 (200) >> [id number] >> 115-195, 223 >> 157-215 >> 103-222 - 2h after lunch: 151, 199 >> n/c - before dinner: 130-160 >> 91-157 >> 96-145-239 >> 152 >> 85, 109-172, 212 >> 105-227 - bedtime: 180, 279 >> n/c No lows; she has hypoglycemia awareness at 80.  She is not sure yet about gastric bypass surgery - has had presurgical nutrition classes.   Pt does not have chronic kidney disease, last BUN/creatinine was:  Lab Results  Component Value Date   BUN 13 08/21/2014   Lab Results  Component Value Date   CREATININE 0.7 08/21/2014  She is on Ramipril.  Last set of lipids: Lab Results  Component Value Date   CHOL 167 08/21/2014   HDL 38.60* 08/21/2014   LDLCALC 93 01/09/2013   LDLDIRECT 100.9 08/21/2014   TRIG 266.0* 08/21/2014   CHOLHDL 4 08/21/2014   Pt's last eye exam was: Component Date Value  . [facility] Diabetic Eye Exam Dr Senaida Ores 10/2014 No Retinopathy   - Has numbness and tingling in her legs, was on Lyrica but had to stop due to somnolence. Now on Neurontin 300 mg tid. She had an exacerbation recently.  She also has a history of HL - on cholestyramine, pravastatin 40, omega 3 fish oil (managed by cardiology) ; HTN - on Ramipril; Obesity, OSA, OA - on Osteobiflex.  I reviewed pt's medications, allergies, PMH, social hx, family hx, and changes were documented in the history of present illness. Otherwise, unchanged from my initial visit note.  ROS: Constitutional: + Fatigue, + poor sleep, + excessive urination Eyes: no blurry vision, no xerophthalmia ENT: no sore throat, no nodules palpated in throat, no dysphagia/odynophagia, + hoarseness Cardiovascular: no CP/SOB/palpitations/+ swelling in hands and feet Respiratory: no cough/SOB/wheezing  Gastrointestinal: + N/no V/+ D/no  C Musculoskeletal: + muscle/+ joint aches Skin: no  rashes, no itching, + hair loss Neurological: no tremors/numbness and tingling/dizziness, + HA  PE: BP 126/62 mmHg  Pulse 88  Temp(Src) 97.8 F (36.6 C) (Oral)  Resp 12  Wt 277 lb (125.646 kg)  SpO2 96% Wt Readings from Last 3 Encounters:  05/02/15 277 lb (125.646 kg)  12/28/14 277 lb 6.4 oz (125.828 kg)  11/16/14 279 lb (126.554 kg)   Constitutional: obese class 3, in NAD, pale Eyes: PERRLA, EOMI, no exophthalmos ENT: moist mucous membranes, no thyromegaly, no cervical lymphadenopathy Cardiovascular: RRR, No MRG, mild periankle edema Respiratory: CTA B Gastrointestinal: abdomen soft, NT, ND, BS+ Musculoskeletal: no deformities, strength  intact in all 4 Skin: moist, warm, no rashes; thin hair Neurological: no tremor with outstretched hands, DTR normal in all 4  ASSESSMENT: 1. DM2, insulin-dependent, uncontrolled, with complications - CAD - s/p stent 11/2004 - peripheral neuropathy She told me that her previous endocrinologist filled out FMLA forms 2/2 uncontrolled DM2, but I cannot fill this since no major highs or lows or other issues.   PLAN:  1. DM2 Patient with long-standing insulin-dependent diabetes, with high, fluctuating sugars, due to erratic meals and stress at work. Will increase her insulin doses. - I advised her to: Patient Instructions    Please increase: - NPH and Regular insulin as follows Breakfast  Lunch  Dinner bedtime  Insulin N: 34 units -  - 34 units  Insulin R: 20 units with a  smaller meal                 24 units wiith a  regular meal 20 units with a smaller meal 24 units with a larger meal 20 units with a smaller meal 24 units with a larger meal  -  Metformin 500 mg 500 mg  500 mg -   Please come back for a follow-up appointment in 3 months.  - up to date with eye exams - given more sugar logs - check Hba1c today >> 9.1% (stable, still high) - Return to clinic in 3 months with her sugar log

## 2015-05-28 ENCOUNTER — Other Ambulatory Visit: Payer: Self-pay | Admitting: Internal Medicine

## 2015-06-21 ENCOUNTER — Other Ambulatory Visit: Payer: Self-pay | Admitting: *Deleted

## 2015-06-21 MED ORDER — GLUCOSE BLOOD VI STRP: ORAL_STRIP | Status: DC

## 2015-07-04 ENCOUNTER — Other Ambulatory Visit: Payer: Self-pay | Admitting: *Deleted

## 2015-07-04 MED ORDER — INSULIN REGULAR HUMAN 100 UNIT/ML IJ SOLN: INTRAMUSCULAR | Status: DC

## 2015-08-06 ENCOUNTER — Other Ambulatory Visit: Payer: Self-pay | Admitting: Cardiovascular Disease

## 2015-08-12 ENCOUNTER — Encounter: Payer: Self-pay | Admitting: *Deleted

## 2015-08-12 NOTE — Progress Notes (Signed)
Patient ID: Betty FerrierHelen E [name], female   DOB: 05-28-1956, 59 y.o.   MRN: [id number] [name] is a 59 y.o.  patient of Dr Teressa LowerBensimohn with a history of coronary artery disease, status PTCA and stenting of an LAD lesion in February 2006 with Cypher drug-eluting stent. She had a Myoview in January 2007 for recurrent chest pain and showed an EF of 58% with normal perfusion. The rest of the medical history is notable for morbid obesity, diabetes, hypertension, hyperlipidemia, and sleep apnea.  She has been intolerant of most  STATINS due to myalgias.  She returns today for yearly followup. She is doing very well. Her functional capacity is limited mostly by arthritis in her back. Blood sugar control improving - following with Dr. Lafe GarinGherge    Tolerating pravastatin at 40mg    Anginal equivalent dyspnea  when she was in [place] and diagnosed with LAD stenosis   07/24/14 Myovue low risk breast attenuation EF 56% no ischemia   ROS: Denies fever, malais, weight loss, blurry vision, decreased visual acuity, cough, sputum, SOB, hemoptysis, pleuritic pain, palpitaitons, heartburn, abdominal pain, melena, lower extremity edema, claudication, or rash.  All other systems reviewed and negative  General: Affect appropriate Overweight white female HEENT: normal Neck supple with no adenopathy JVP normal no bruits no thyromegaly Lungs clear with no wheezing and good diaphragmatic motion Heart:  S1/S2 no murmur, no rub, gallop or click PMI normal Abdomen: benighn, BS positve, no tenderness, no AAA no bruit.  No HSM or HJR Distal pulses intact with no bruits No edema Neuro non-focal Skin warm and dry No muscular weakness   Current Outpatient Prescriptions  Medication Sig Dispense Refill  . aspirin 81 MG tablet Take 81 mg by mouth daily.      . beta carotene [id number] UNIT capsule Take 25,000 Units by mouth daily.      . Biotin 5000 MCG TABS Take 1 tablet by mouth daily.     . Calcium Carbonate-Vitamin  D (CALCIUM-VITAMIN D) 600-200 MG-UNIT CAPS Take 1 tablet by mouth daily.     . diphenhydrAMINE (BENADRYL) 25 mg capsule Take 25 mg by mouth every 6 (six) hours as needed for itching.    . fluorometholone (FML) 0.1 % ophthalmic suspension Place 1 drop into both eyes daily.     Marland Kitchen. gabapentin (NEURONTIN) 300 MG capsule Take 300 mg by mouth 3 (three) times daily.    Marland Kitchen. glucose blood test strip Test blood sugar 5 times daily as instructed 150 each 5  . insulin NPH Human (NOVOLIN N RELION) 100 UNIT/ML injection Inject 34 units 2x a day 20 mL 2  . insulin regular (NOVOLIN R RELION) 100 units/mL injection INJECT 20-24 UNITS INTO THE SKIN 30 MINUTES BEFORE EACH MEAL. 20 mL 1  . Insulin Syringe-Needle U-100 (RELION INSULIN SYR 1CC/30G) 30G X 5/16" 1 ML MISC Use 4x a day 360 each 2  . Magnesium 250 MG TABS Take 1 tablet by mouth daily.      . metFORMIN (GLUCOPHAGE-XR) 500 MG 24 hr tablet Take 1 tablet (500 mg total) by mouth 2 (two) times daily with a meal. 60 tablet 2  . Misc Natural Products (OSTEO BI-FLEX JOINT SHIELD PO) Take 1 tablet by mouth daily.      . Omega-3 Fatty Acids (FISH OIL CONCENTRATE PO) Take 1 tablet by mouth daily.      . pravastatin (PRAVACHOL) 40 MG tablet Take 1 tablet (40 mg total) by mouth daily. 90 tablet 3  . ramipril (ALTACE) 10  MG capsule Take 1 capsule (10 mg total) by mouth daily. 90 capsule 3   No current facility-administered medications for this visit.    Allergies  Lyrica  Electrocardiogram:   07/2014  SR rate 78 limb lead voltage for LVH  08/14/15  SR rate 85  Nonspecific ST changes low precordial voltage   Assessment and Plan CAD:  Distant stent LAD 2006 normal myovue 07/2014  Continue current medical RX consider adding beta blocker for relative tachycardia Once BS/DM squared away  Chol:  On statin  Recent labs with primary  Lab Results  Component Value Date   LDLCALC 93 01/09/2013  DM:  Discussed low carb diet.  Target hemoglobin A1c is 6.5 or less.  Continue  current medications.  Weight is up Last A1c over 9 f/u Dr Dan Europe for more intensive Rx  Neuropathy:  Primarily left lateral foot pain.  Intolerant to lyrica and no help with neurontin Has had xrays and no fracture f/u podiatry    Charlton Haws

## 2015-08-14 ENCOUNTER — Ambulatory Visit (INDEPENDENT_AMBULATORY_CARE_PROVIDER_SITE_OTHER): Payer: Federal, State, Local not specified - PPO | Admitting: Cardiovascular Disease

## 2015-08-14 ENCOUNTER — Encounter: Payer: Self-pay | Admitting: Cardiovascular Disease

## 2015-08-14 VITALS — BP 130/62 | HR 85 | Ht 67.0 in | Wt 277.0 lb

## 2015-08-14 DIAGNOSIS — R0600 Dyspnea, unspecified: Secondary | ICD-10-CM | POA: Diagnosis not present

## 2015-08-14 MED ORDER — PRAVASTATIN SODIUM 40 MG PO TABS: 40.0000 mg | ORAL_TABLET | Freq: Every day | ORAL | Status: DC

## 2015-08-14 NOTE — Patient Instructions (Addendum)

## 2015-08-15 ENCOUNTER — Encounter: Payer: Self-pay | Admitting: Internal Medicine

## 2015-08-15 ENCOUNTER — Ambulatory Visit (INDEPENDENT_AMBULATORY_CARE_PROVIDER_SITE_OTHER): Payer: Federal, State, Local not specified - PPO | Admitting: Internal Medicine

## 2015-08-15 ENCOUNTER — Other Ambulatory Visit (INDEPENDENT_AMBULATORY_CARE_PROVIDER_SITE_OTHER): Payer: Federal, State, Local not specified - PPO | Admitting: *Deleted

## 2015-08-15 VITALS — BP 118/64 | HR 88 | Temp 98.3°F | Resp 12 | Wt 273.0 lb

## 2015-08-15 DIAGNOSIS — Z794 Long term (current) use of insulin: Secondary | ICD-10-CM | POA: Diagnosis not present

## 2015-08-15 DIAGNOSIS — E1165 Type 2 diabetes mellitus with hyperglycemia: Secondary | ICD-10-CM

## 2015-08-15 DIAGNOSIS — E118 Type 2 diabetes mellitus with unspecified complications: Secondary | ICD-10-CM

## 2015-08-15 DIAGNOSIS — IMO0002 Reserved for concepts with insufficient information to code with codable children: Secondary | ICD-10-CM

## 2015-08-15 LAB — POCT GLYCOSYLATED HEMOGLOBIN (HGB A1C): Hemoglobin A1C: 8.9

## 2015-08-15 MED ORDER — INSULIN NPH (HUMAN) (ISOPHANE) 100 UNIT/ML ~~LOC~~ SUSP: SUBCUTANEOUS | Status: DC

## 2015-08-15 MED ORDER — EXENATIDE ER 2 MG ~~LOC~~ SRER: 2.0000 mg | SUBCUTANEOUS | Status: DC

## 2015-08-15 MED ORDER — METFORMIN HCL ER 500 MG PO TB24: 500.0000 mg | ORAL_TABLET | Freq: Three times a day (TID) | ORAL | Status: DC

## 2015-08-15 MED ORDER — INSULIN REGULAR HUMAN 100 UNIT/ML IJ SOLN: INTRAMUSCULAR | Status: DC

## 2015-08-15 NOTE — Patient Instructions (Signed)
Please add Bydureon 2 mg once a week.  Please continue: - NPH and Regular insulin as follows: Breakfast  Lunch  Dinner bedtime  Insulin N: 34 units -  - 34 units (try 40 units)  Insulin R: 20 units with a  smaller meal                 24 units wiith a  regular meal 20 units with a smaller meal 24 units with a larger meal 20 units with a smaller meal 24 units with a larger meal  -  Metformin 500 mg 500 mg  500 mg -   Please come back for a follow-up appointment in 2 months.

## 2015-08-15 NOTE — Progress Notes (Signed)
Patient ID: Betty [name], female   DOB: 1956/03/10, 59 y.o.   MRN: [id number]  HPI: Betty [name] is a 59 y.o. female, returning for f/u for DM2, dx >10 years ago, insulin-dependent, uncontrolled, with complications (CAD, peripheral neuropathy). Last visit 3.5 mo ago. She is here with her daughter who offers part of the hx.  Last hemoglobin A1c: Lab Results  Component Value Date   HGBA1C 9.1 05/02/2015   HGBA1C 9.1* 11/16/2014   HGBA1C 8.6* 08/21/2014   Pt is on a regimen of: - Metformin XR 500 mg tid (was on 2000 mg daily, but had to lower it b/c diarrhea) - NPH and Regular insulin as follows Breakfast  Lunch  Dinner bedtime  Insulin N: 34 units -  - 34 units  Insulin R: 20 units with a  smaller meal                 24 units wiith a  regular meal 20 units with a smaller meal 24 units with a larger meal 20 units with a smaller meal 24 units with a larger meal  -  Metformin XR 500 mg 500 mg  500 mg -   Was previously on 70/30 Insulin: 70-20-30 (split in 3 b/c she was dropping her sugars overnight) or 70-50. On these, she was still having lows in am (40s). She was on Novolog in the past but could not afford it.  She was also on Amaryl and Avandia. She has bladder dysfxn - cannot try SGLT2 inh.   Pt checks her sugars 1-3x day (brings log) - fluctuating, but appear higher: - am: 90-248 >> 100-150, occasionally higher >> 118-216 >> 124-214 >> 158-263 >> 133, 162-250 (may not be fasting) - 2h after b'fast:  >> 145, 215 >> n/c - before lunch:(75) 90-168 (200) >> [phone] >> 115-195, 223 >> 157-215 >> 103-222 >> 169-249 - 2h after lunch: 151, 199 >> n/c - before dinner: 130-160 >> 91-157 >> 96-145-239 >> 152 >> 85, 109-172, 212 >> 105-227 >> 124-234 - bedtime: 180, 279 >> n/c >> 237 No lows; she has hypoglycemia awareness at 80.  She is not sure yet about gastric bypass surgery - has had presurgical nutrition classes.   Pt does not have chronic kidney disease, last  BUN/creatinine was:  Lab Results  Component Value Date   BUN 13 08/21/2014   Lab Results  Component Value Date   CREATININE 0.7 08/21/2014  She is on Ramipril.  Last set of lipids: Lab Results  Component Value Date   CHOL 167 08/21/2014   HDL 38.60* 08/21/2014   LDLCALC 93 01/09/2013   LDLDIRECT 100.9 08/21/2014   TRIG 266.0* 08/21/2014   CHOLHDL 4 08/21/2014   Pt's last eye exam was: Component Date Value  . [facility] Diabetic Eye Exam Dr Senaida Ores 10/2014 No Retinopathy   - Has numbness and tingling in her legs, was on Lyrica but had to stop due to somnolence. Now on Neurontin 300 mg tid. Sees Dr Yetta Barre (podiatrist).   She also has a history of HL - on cholestyramine, pravastatin 40, omega 3 fish oil (managed by cardiology) ; HTN; Obesity, OSA, OA - on Osteobiflex.  I reviewed pt's medications, allergies, PMH, social hx, family hx, and changes were documented in the history of present illness. Otherwise, unchanged from my initial visit note.  ROS: Constitutional: + Fatigue, + poor sleep, + excessive urination Eyes: no blurry vision, no xerophthalmia ENT: no sore throat, no nodules palpated in throat, no  dysphagia/odynophagia, + hoarseness Cardiovascular: no CP/SOB/palpitations/+ swelling in hands and feet Respiratory: no cough/SOB/wheezing  Gastrointestinal: + N/no V/+ D/no  C Musculoskeletal: + muscle/+ joint aches Skin: no rashes, no itching, + hair loss Neurological: no tremors/numbness and tingling/dizziness, + HA  PE: BP 118/64 mmHg  Pulse 88  Temp(Src) 98.3 F (36.8 C) (Oral)  Resp 12  Wt 273 lb (123.832 kg)  SpO2 98% Body mass index is 42.75 kg/(m^2). Wt Readings from Last 3 Encounters:  08/15/15 273 lb (123.832 kg)  08/14/15 277 lb (125.646 kg)  05/02/15 277 lb (125.646 kg)   Constitutional: obese class 3, in NAD, pale Eyes: PERRLA, EOMI, no exophthalmos ENT: moist mucous membranes, no thyromegaly, no cervical lymphadenopathy Cardiovascular: RRR, No MRG,  mild periankle edema Respiratory: CTA B Gastrointestinal: abdomen soft, NT, ND, BS+ Musculoskeletal: no deformities, strength intact in all 4 Skin: moist, warm, no rashes; thin hair Neurological: no tremor with outstretched hands, DTR normal in all 4  ASSESSMENT: 1. DM2, insulin-dependent, uncontrolled, with complications - CAD - s/p stent 11/2004 - Dr Eden EmmsNishan - peripheral neuropathy She told me that her previous endocrinologist filled out FMLA forms 2/2 uncontrolled DM2, but I cannot fill this since no major highs or lows or other issues.   PLAN:  1. DM2 Patient with long-standing insulin-dependent diabetes, with high, fluctuating sugars, due to erratic meals and stress at work. We discussed about Invokana (she has increased urination and spilling urine) >> will not use. We also discussed about a weekly GLP1 R agonist >> will try Bydureon. Will also try to increase NPH at night. - I advised her to: Patient Instructions   Please add Bydureon 2 mg once a week.  Please continue: - NPH and Regular insulin as follows: Breakfast  Lunch  Dinner bedtime  Insulin N: 34 units -  - 34 units (try 40 units)  Insulin R: 20 units with a  smaller meal                 24 units wiith a  regular meal 20 units with a smaller meal 24 units with a larger meal 20 units with a smaller meal 24 units with a larger meal  -  Metformin 500 mg 500 mg  500 mg -   Please come back for a follow-up appointment in 2 months.  - up to date with eye exams - will need labs at next visit: CMP and Lipids if not checked By PCP w/in last year. - given more sugar logs - refilled all her DM meds - check Hba1c today >> 8.9% (a little better, still high) - Return to clinic in 2 months with her sugar log

## 2015-08-16 ENCOUNTER — Telehealth: Payer: Self-pay | Admitting: Internal Medicine

## 2015-08-16 NOTE — Telephone Encounter (Signed)
Called pt's daughter and advised her per Dr Charlean SanfilippoGherghe's message below. She voiced understanding.

## 2015-08-16 NOTE — Telephone Encounter (Signed)
Please read message below and advise.  

## 2015-08-16 NOTE — Telephone Encounter (Signed)
Patients daughter called stating that she read about bydureon causing nausea  If this occurs will she be able to take alka seltzer?   Please advise    Thank you

## 2015-08-16 NOTE — Telephone Encounter (Signed)
Yes. We did discuss about nausea and this class of meds. She may not get it but let me know if this persists after using Bydureon  For 1-2 weeks.

## 2015-08-21 ENCOUNTER — Telehealth: Payer: Self-pay | Admitting: Internal Medicine

## 2015-08-21 NOTE — Telephone Encounter (Signed)
Called pt and lvm advising her that Dr Elvera LennoxGherghe is out of town and will not return until Monday. Advised her that I will let her know that she cannot afford the Bydureon and we will call her on Monday. Please advise.

## 2015-08-21 NOTE — Telephone Encounter (Signed)
Patient stated that she could not afford the medication she prescribed, please advise

## 2015-08-23 NOTE — Telephone Encounter (Signed)
Unable to reach patient.

## 2015-08-23 NOTE — Telephone Encounter (Signed)
Betty [name], can she check with her insurance if Tanzeum or Trulicity may be cheaper? We can also give her coupons for any of these. If not, maybe we should start taking advantage of the reps to help Betty [name] in her case.

## 2015-09-16 ENCOUNTER — Other Ambulatory Visit: Payer: Self-pay | Admitting: *Deleted

## 2015-09-16 MED ORDER — RAMIPRIL 10 MG PO CAPS: 10.0000 mg | ORAL_CAPSULE | Freq: Every day | ORAL | Status: DC

## 2015-09-20 ENCOUNTER — Other Ambulatory Visit: Payer: Self-pay | Admitting: Internal Medicine

## 2015-11-16 ENCOUNTER — Other Ambulatory Visit: Payer: Self-pay | Admitting: Internal Medicine

## 2015-12-08 ENCOUNTER — Other Ambulatory Visit: Payer: Self-pay | Admitting: Internal Medicine

## 2015-12-11 ENCOUNTER — Other Ambulatory Visit: Payer: Self-pay | Admitting: Internal Medicine

## 2015-12-23 ENCOUNTER — Other Ambulatory Visit: Payer: Self-pay | Admitting: Internal Medicine

## 2016-01-01 ENCOUNTER — Telehealth: Payer: Self-pay | Admitting: Internal Medicine

## 2016-01-01 MED ORDER — GABAPENTIN 300 MG PO CAPS: 300.0000 mg | ORAL_CAPSULE | Freq: Three times a day (TID) | ORAL | Status: DC

## 2016-01-01 NOTE — Telephone Encounter (Signed)
Pt said pharmacy wouldn't refill her Gabapentin and she is in a lot of pain and wants to know if it is possible to fill enough until her May follow up appointment.

## 2016-01-01 NOTE — Telephone Encounter (Signed)
Refill sent to pt's pharmacy. 

## 2016-01-07 ENCOUNTER — Ambulatory Visit: Payer: Federal, State, Local not specified - PPO | Admitting: Internal Medicine

## 2016-01-15 ENCOUNTER — Other Ambulatory Visit: Payer: Self-pay | Admitting: Internal Medicine

## 2016-02-12 ENCOUNTER — Other Ambulatory Visit: Payer: Self-pay | Admitting: Internal Medicine

## 2016-02-25 NOTE — Progress Notes (Signed)
Patient ID: Betty FerrierHelen E [name], female   DOB: 11-08-1955, 60 y.o.   MRN: [id number]   [name] is a 60 y.o.  patient of Dr Teressa LowerBensimohn with a history of coronary artery disease, status PTCA and stenting of an LAD lesion in February 2006 with Cypher drug-eluting stent.  Anginal equivalent dyspnea  when she was in [place] and diagnosed with LAD stenosis  07/24/14 Myovue low risk breast attenuation EF 56% no ischemia    The rest of the medical history is notable for morbid obesity, diabetes, hypertension, hyperlipidemia, and sleep apnea.  She has been intolerant of most  STATINS due to myalgias. But tolerating pravastatin now   She returns today for yearly followup. She is doing very well. Her functional capacity is limited mostly by arthritis in her back. Blood sugar control improving - following with Dr. Lafe GarinGherge  Daughter moved in with her and her boyfriend. Daughter is trying to get custody of her child but has to go back to [place] for court  She has arthritic pain in her left shoulder and elbow   ROS: Denies fever, malais, weight loss, blurry vision, decreased visual acuity, cough, sputum, SOB, hemoptysis, pleuritic pain, palpitaitons, heartburn, abdominal pain, melena, lower extremity edema, claudication, or rash.  All other systems reviewed and negative  General: Affect appropriate Overweight white female HEENT: normal Neck supple with no adenopathy JVP normal no bruits no thyromegaly Lungs clear with no wheezing and good diaphragmatic motion Heart:  S1/S2 no murmur, no rub, gallop or click PMI normal Abdomen: benighn, BS positve, no tenderness, no AAA no bruit.  No HSM or HJR Distal pulses intact with no bruits No edema Neuro non-focal Skin warm and dry No muscular weakness   Current Outpatient Prescriptions  Medication Sig Dispense Refill  . aspirin 81 MG tablet Take 81 mg by mouth daily.      . beta carotene [id number] UNIT capsule Take 25,000 Units by mouth daily.       . Calcium Carbonate-Vitamin D (CALCIUM-VITAMIN D) 600-200 MG-UNIT CAPS Take 1 tablet by mouth daily.     . diphenhydrAMINE (BENADRYL) 25 mg capsule Take 25 mg by mouth every 6 (six) hours as needed for itching.    . fluorometholone (FML) 0.1 % ophthalmic suspension Place 1 drop into both eyes daily.     Marland Kitchen. gabapentin (NEURONTIN) 300 MG capsule TAKE ONE (1) CAPSULE THREE (3) TIMES EACH DAY 90 capsule 1  . Magnesium 250 MG TABS Take 1 tablet by mouth daily.      . metFORMIN (GLUCOPHAGE-XR) 500 MG 24 hr tablet Take 1 tablet (500 mg total) by mouth 3 (three) times daily with meals. 180 tablet 5  . Misc Natural Products (OSTEO BI-FLEX JOINT SHIELD PO) Take 1 tablet by mouth daily.      Marland Kitchen. NOVOLIN N RELION 100 UNIT/ML injection INJECT 34 UNITS SUBCUTANEOUSLY TWICE DAILY. *NEED APPOINTMENT FOR FURTHER REFILLS* 20 mL 0  . NOVOLIN R RELION 100 UNIT/ML injection INJECT 20-24 UNITS INTO THE SKIN 30 MINUTES BEFORE EACH MEAL 20 mL 0  . Omega-3 Fatty Acids (FISH OIL CONCENTRATE PO) Take 1 tablet by mouth daily.      . ondansetron (ZOFRAN) 4 MG tablet Take 4 mg by mouth every 8 (eight) hours as needed for nausea or vomiting.    . pravastatin (PRAVACHOL) 40 MG tablet Take 1 tablet (40 mg total) by mouth daily. 90 tablet 3  . ramipril (ALTACE) 10 MG capsule Take 1 capsule (10 mg total) by mouth daily. **  PT NEEDS APPT FOR FURTHER REFILLS** 90 capsule 0   No current facility-administered medications for this visit.    Allergies  Lyrica  Electrocardiogram:   07/2014  SR rate 78 limb lead voltage for LVH  08/14/15  SR rate 85  Nonspecific ST changes low precordial voltage   Assessment and Plan CAD:  Distant stent LAD 2006 normal myovue 07/2014  Continue current medical RX consider adding beta blocker for relative tachycardia Chol:  On statin  Recent labs with primary  Lab Results  Component Value Date   LDLCALC 93 01/09/2013  DM:  Discussed low carb diet.  Target hemoglobin A1c is 6.5 or less.  Continue  current medications.  Weight is up Last A1c over 8.9 f/u Dr Dan Europe for more intensive Rx  Neuropathy:  Primarily left lateral foot pain.  Intolerant to lyrica and no help with neurontin Has had xrays and no fracture f/u podiatry    Charlton Haws

## 2016-02-27 ENCOUNTER — Other Ambulatory Visit: Payer: Self-pay | Admitting: Internal Medicine

## 2016-02-28 ENCOUNTER — Other Ambulatory Visit: Payer: Self-pay | Admitting: Internal Medicine

## 2016-02-28 ENCOUNTER — Ambulatory Visit: Payer: Federal, State, Local not specified - PPO | Admitting: Internal Medicine

## 2016-03-02 ENCOUNTER — Encounter: Payer: Self-pay | Admitting: Cardiovascular Disease

## 2016-03-02 ENCOUNTER — Ambulatory Visit (INDEPENDENT_AMBULATORY_CARE_PROVIDER_SITE_OTHER): Payer: Federal, State, Local not specified - PPO | Admitting: Cardiovascular Disease

## 2016-03-02 VITALS — BP 120/60 | HR 85 | Ht 67.0 in | Wt 285.8 lb

## 2016-03-02 DIAGNOSIS — I2510 Atherosclerotic heart disease of native coronary artery without angina pectoris: Secondary | ICD-10-CM

## 2016-03-02 NOTE — Patient Instructions (Signed)

## 2016-03-06 ENCOUNTER — Ambulatory Visit (INDEPENDENT_AMBULATORY_CARE_PROVIDER_SITE_OTHER): Payer: Federal, State, Local not specified - PPO | Admitting: Internal Medicine

## 2016-03-06 ENCOUNTER — Encounter: Payer: Self-pay | Admitting: Internal Medicine

## 2016-03-06 ENCOUNTER — Other Ambulatory Visit (INDEPENDENT_AMBULATORY_CARE_PROVIDER_SITE_OTHER): Payer: Federal, State, Local not specified - PPO | Admitting: *Deleted

## 2016-03-06 VITALS — BP 124/72 | HR 90 | Temp 99.3°F | Resp 12 | Wt 286.8 lb

## 2016-03-06 DIAGNOSIS — E1165 Type 2 diabetes mellitus with hyperglycemia: Secondary | ICD-10-CM

## 2016-03-06 DIAGNOSIS — Z794 Long term (current) use of insulin: Secondary | ICD-10-CM

## 2016-03-06 DIAGNOSIS — IMO0002 Reserved for concepts with insufficient information to code with codable children: Secondary | ICD-10-CM

## 2016-03-06 DIAGNOSIS — E1159 Type 2 diabetes mellitus with other circulatory complications: Secondary | ICD-10-CM | POA: Insufficient documentation

## 2016-03-06 DIAGNOSIS — E118 Type 2 diabetes mellitus with unspecified complications: Secondary | ICD-10-CM

## 2016-03-06 LAB — POCT GLYCOSYLATED HEMOGLOBIN (HGB A1C): HEMOGLOBIN A1C: 8.3

## 2016-03-06 MED ORDER — INSULIN NPH (HUMAN) (ISOPHANE) 100 UNIT/ML ~~LOC~~ SUSP: SUBCUTANEOUS | Status: DC

## 2016-03-06 MED ORDER — DULAGLUTIDE 0.75 MG/0.5ML ~~LOC~~ SOAJ: SUBCUTANEOUS | Status: DC

## 2016-03-06 NOTE — Patient Instructions (Addendum)
   Breakfast  Lunch  Dinner bedtime  Insulin N: 34 units >> 40 -  - 40 units  Insulin R: 20 units with a  smaller meal                 24 units wiith a  regular meal 20 units with a smaller meal 24 units with a larger meal 20 units with a smaller meal 24 units with a larger meal  -  Metformin XR 500 mg 500 mg  500 mg -    Please add Trulicity 0.75 mg weekly.  If this is not covered, we can try Tanzeum.  Please return in 3 months with your sugar log.

## 2016-03-06 NOTE — Progress Notes (Signed)
Patient ID: Ammie FerrierHelen E Dehner, female   DOB: 02-25-1956, 60 y.o.   MRN: [id number]  HPI: Ammie FerrierHelen E Tafoya is a 60 y.o. female, returning for f/u for DM2, dx >10 years ago, insulin-dependent, uncontrolled, with complications (CAD, peripheral neuropathy). Last visit 7 mo ago.   She gained 13 lbs since last visit.  Last hemoglobin A1c: Lab Results  Component Value Date   HGBA1C 8.9 08/15/2015   HGBA1C 9.1 05/02/2015   HGBA1C 9.1* 11/16/2014   Pt is on a regimen of: - NPH and Regular insulin as follows Breakfast  Lunch  Dinner bedtime  Insulin N: 34 units -  - 40 units  Insulin R: 20 units with a  smaller meal                 24 units wiith a  regular meal 20 units with a smaller meal 24 units with a larger meal 20 units with a smaller meal 24 units with a larger meal  -  Metformin XR 500 mg 500 mg  500 mg -   We tried Bydureon 2 mg weekly (added 08/2015) >> not covered Was previously on 70/30 Insulin: 70-20-30 (split in 3 b/c she was dropping her sugars overnight) or 70-50. On these, she was still having lows in am (40s). She was on Novolog in the past but could not afford it.  She was also on Amaryl and Avandia. She has bladder dysfxn - cannot try SGLT2 inh.  She had diarrhea with regular metformin.  Pt checks her sugars 1-3x day (brings log): - am: 118-216 >> 124-214 >> 158-263 >> 133, 162-250 (may not be fasting) >> 134-194, 203 - 2h after b'fast:  >> 145, 215 >> n/c - before lunch:69x1,114-215 >> 115-195, 223 >> 157-215 >> 103-222 >> 169-249 >> 136-226, 268 (snacks) - 2h after lunch: 151, 199 >> n/c - before dinner: 91-157 >> [phone] >> 152 >> 85, 109-172, 212 >> 105-227 >> 124-234 >> 83-154, 188, 249x1 - bedtime: 180, 279 >> n/c >> 237 >> n/c No lows; she has hypoglycemia awareness at 80.  She is not sure yet about gastric bypass surgery - has had presurgical nutrition classes.   Pt does not have chronic kidney disease, last BUN/creatinine was:  Lab Results   Component Value Date   BUN 13 08/21/2014   Lab Results  Component Value Date   CREATININE 0.7 08/21/2014  She is on Ramipril.  Last set of lipids: Lab Results  Component Value Date   CHOL 167 08/21/2014   HDL 38.60* 08/21/2014   LDLCALC 93 01/09/2013   LDLDIRECT 100.9 08/21/2014   TRIG 266.0* 08/21/2014   CHOLHDL 4 08/21/2014   Pt's last eye exam was: Component Date Value  . [facility] Diabetic Eye Exam Dr Senaida Oresichardson 10/2014 No Retinopathy   - Has numbness and tingling in her legs, was on Lyrica but had to stop due to somnolence. Now on Neurontin 300 mg tid. Sees Dr Yetta BarreJones (podiatrist).   She also has a history of HL - on cholestyramine, pravastatin 40, omega 3 fish oil (managed by cardiology) ; HTN; Obesity, OSA, OA - on Osteobiflex.  I reviewed pt's medications, allergies, PMH, social hx, family hx, and changes were documented in the history of present illness. Otherwise, unchanged from my initial visit note.  ROS: Constitutional: + Fatigue,+ nocturia Eyes: no blurry vision, no xerophthalmia ENT: no sore throat, no nodules palpated in throat, no dysphagia/odynophagia, no hoarseness Cardiovascular: no CP/SOB/palpitations/+ swelling in hands and feet Respiratory: no  cough/SOB/wheezing  Gastrointestinal: + N/no V/+ D/+ C Musculoskeletal: + muscle/+ joint aches Skin: no rashes, no itching Neurological: no tremors/numbness and tingling/dizziness, + HA  PE: BP 124/72 mmHg  Pulse 90  Temp(Src) 99.3 F (37.4 C) (Oral)  Resp 12  Wt 286 lb 12.8 oz (130.092 kg)  SpO2 97% Body mass index is 44.91 kg/(m^2). Wt Readings from Last 3 Encounters:  03/06/16 286 lb 12.8 oz (130.092 kg)  03/02/16 285 lb 12.8 oz (129.638 kg)  08/15/15 273 lb (123.832 kg)   Constitutional: obese class 3, in NAD, pale Eyes: PERRLA, EOMI, no exophthalmos ENT: moist mucous membranes, no thyromegaly, no cervical lymphadenopathy Cardiovascular: RRR, No MRG, mild periankle edema Respiratory: CTA  B Gastrointestinal: abdomen soft, NT, ND, BS+ Musculoskeletal: no deformities, strength intact in all 4 Skin: moist, warm, no rashes; thin hair Neurological: no tremor with outstretched hands, DTR normal in all 4  ASSESSMENT: 1. DM2, insulin-dependent, uncontrolled, with complications - CAD - s/p stent 11/2004 - Dr Eden Emms - peripheral neuropathy She told me that her previous endocrinologist filled out FMLA forms 2/2 uncontrolled DM2, but I cannot fill this since no major highs or lows or other issues.   PLAN:  1. DM2 Patient with long-standing insulin-dependent diabetes, with high, fluctuating sugars, due to erratic meals and stress at work.  Will increase NPH in am as sugars before lunch are higher. Will also try to add a GLP1 R agonist >> hopefully we'll have more success with Trulicity or Tanzeum as she could not afford the Bydureon. - I advised her to: Patient Instructions   Breakfast  Lunch  Dinner bedtime  Insulin N: 34 units >> 40 -  - 40 units  Insulin R: 20 units with a  smaller meal                 24 units wiith a  regular meal 20 units with a smaller meal 24 units with a larger meal 20 units with a smaller meal 24 units with a larger meal  -  Metformin XR 500 mg 500 mg  500 mg -    Please add Trulicity 0.75 mg weekly.  If this is not covered, we can try Tanzeum.  Please return in 3 months with your sugar log.   - needs a new eye exam - will need labs at next visit: CMP and Lipids at next visit - check Hba1c today >> 9.3% (higher) - Return to clinic in 2 months with her sugar log

## 2016-03-13 ENCOUNTER — Other Ambulatory Visit: Payer: Self-pay | Admitting: Internal Medicine

## 2016-03-26 ENCOUNTER — Telehealth: Payer: Self-pay | Admitting: Internal Medicine

## 2016-03-26 NOTE — Telephone Encounter (Signed)
She needs to call her insurance about the preferred medication but in the meantime can use up the Trulicity samples, she can call back next week

## 2016-03-26 NOTE — Telephone Encounter (Signed)
Could you please advise during Dr. gherghe's absence? Thanks! 

## 2016-03-26 NOTE — Telephone Encounter (Signed)
Patient calling to report she was recently prescribed Trulicity by Dr. Elvera LennoxGherghe.  However, she declined the prescription at the pharmacy because her out of pocket expense will be $300.  She is asking if prescription can be changed to an alternative medication that is cheaper.  She saw her PCP on yesterday and PCP gave her a 2 week sample of Trulicity.  Patient wants to know if she should take the samples if the medication will be changing.

## 2016-03-26 NOTE — Telephone Encounter (Signed)
I contacted the Betty [name] and advised of note below. Betty [name] voiced understanding and will wait till Dr. Elvera LennoxGherghe comes back to proceed.

## 2016-04-06 ENCOUNTER — Telehealth: Payer: Self-pay | Admitting: Endocrinology

## 2016-04-06 ENCOUNTER — Telehealth: Payer: Self-pay | Admitting: Internal Medicine

## 2016-04-06 NOTE — Telephone Encounter (Signed)
Patient ask if there are any other alternative for medication Trulicity,it is 300dres dollars, can't afford it. please advise

## 2016-04-06 NOTE — Telephone Encounter (Signed)
Betty FanningJulie, let's send Tanzeum 50 mg sq weekly. #4 pens with 2 refills.

## 2016-04-06 NOTE — Telephone Encounter (Signed)
For you -

## 2016-04-06 NOTE — Telephone Encounter (Signed)
Patient ask is there any other alternative for the medication Trulicity, it is 300derd dollars and can't afford it. Please advise

## 2016-04-07 ENCOUNTER — Other Ambulatory Visit: Payer: Self-pay

## 2016-04-07 MED ORDER — ALBIGLUTIDE 50 MG ~~LOC~~ PEN: 50.0000 mg | PEN_INJECTOR | SUBCUTANEOUS | Status: DC

## 2016-04-07 NOTE — Telephone Encounter (Signed)
Tanzeum ordered, Trulicity discontinued per cost reasons. Called patient to notify of medication change. Left message regarding medication was sent to her pharmacy. Asked to call back if any questions or concerns.

## 2016-04-18 ENCOUNTER — Other Ambulatory Visit: Payer: Self-pay | Admitting: Internal Medicine

## 2016-04-20 ENCOUNTER — Telehealth: Payer: Self-pay | Admitting: Internal Medicine

## 2016-04-20 ENCOUNTER — Telehealth: Payer: Self-pay

## 2016-04-20 NOTE — Telephone Encounter (Signed)
Another option is Bydureon 2 mg weekly. We can try to send one box of 4 pounds with 2 refills. However, Raynelle FanningJulie, can you please call her pharmacy and talk to them to see if this, versus Tanzeum versus Trulicity versus Victoza would be cheaper. Thank you.

## 2016-04-20 NOTE — Telephone Encounter (Signed)
Sent in patients refill request. Please advise about the Tanzeum. Thank you!

## 2016-04-20 NOTE — Telephone Encounter (Signed)
Patient need a refill of ramipril (ALTACE) 10 MG capsule send to  [facility] - [place] - [address] [phone] (Phone) [phone])       Patient ask if you have another alternative for Albiglutide (TANZEUM) 50 MG PEN, it is to expensive, please advise

## 2016-04-21 ENCOUNTER — Other Ambulatory Visit: Payer: Self-pay

## 2016-04-21 MED ORDER — LIRAGLUTIDE 18 MG/3ML ~~LOC~~ SOPN: 1.8000 mg | PEN_INJECTOR | Freq: Every day | SUBCUTANEOUS | Status: DC

## 2016-04-21 NOTE — Telephone Encounter (Signed)
Yes, if she agrees with this as it is injected once a day in am. Let's send 1.8 mg daily, # 3 pens with 3 refills. Please advised her to start a 0.6 mg daily for 5 days, then increase to 1.2 mg daily for another 5 days, then increase to the final dose of 1.8 mg daily if she tolerates this well. She may have a little nausea with the higher doses, in this case, she needs to back off to the previous dose that she tolerated well.

## 2016-04-21 NOTE — Telephone Encounter (Signed)
Called to notify patient we changed medication to Victoza, I advised patient to call back with instructions on how to use this medication.

## 2016-04-21 NOTE — Telephone Encounter (Signed)
PT returning [name]'s phone call. °

## 2016-04-21 NOTE — Telephone Encounter (Signed)
Reviewed insurance formulary Tanzeum is not covered, Victoza has a lower copayment than Trulicity, Please advise if okay to sent Victoza.

## 2016-04-23 ENCOUNTER — Telehealth: Payer: Self-pay

## 2016-04-23 ENCOUNTER — Telehealth: Payer: Self-pay | Admitting: Internal Medicine

## 2016-04-23 NOTE — Telephone Encounter (Signed)
Returning your call. °

## 2016-04-23 NOTE — Telephone Encounter (Signed)
Spoke with patient about switching medications and sending it to the pharmacy, states she will go pick up medication and start with the new medication. No other questions or concerns.

## 2016-06-08 ENCOUNTER — Encounter: Payer: Self-pay | Admitting: Internal Medicine

## 2016-06-08 ENCOUNTER — Ambulatory Visit (INDEPENDENT_AMBULATORY_CARE_PROVIDER_SITE_OTHER): Payer: Federal, State, Local not specified - PPO | Admitting: Internal Medicine

## 2016-06-08 VITALS — BP 124/78 | HR 89 | Ht 67.5 in | Wt 278.0 lb

## 2016-06-08 DIAGNOSIS — E1165 Type 2 diabetes mellitus with hyperglycemia: Secondary | ICD-10-CM | POA: Diagnosis not present

## 2016-06-08 DIAGNOSIS — IMO0002 Reserved for concepts with insufficient information to code with codable children: Secondary | ICD-10-CM

## 2016-06-08 DIAGNOSIS — E118 Type 2 diabetes mellitus with unspecified complications: Secondary | ICD-10-CM | POA: Diagnosis not present

## 2016-06-08 DIAGNOSIS — Z794 Long term (current) use of insulin: Secondary | ICD-10-CM | POA: Diagnosis not present

## 2016-06-08 MED ORDER — INSULIN PEN NEEDLE 32G X 4 MM MISC: 3 refills | Status: DC

## 2016-06-08 NOTE — Patient Instructions (Addendum)
Please continue:  - NPH and Regular insulin as follows Breakfast  Lunch  Dinner bedtime  Insulin N: 34-37 units -  - 34-37 units  Insulin R: 20 units with a  smaller meal                 24 units wiith a  regular meal 20 units with a smaller meal 24 units with a larger meal 20 units with a smaller meal 24 units with a larger meal  -    - Metformin XR 500 mg 2x a day - Victoza 1.8 mg daily in am  Please come back for labs.  KEEP UP THE GREAT JOB!  Please return in 3 months with your sugar log.

## 2016-06-08 NOTE — Progress Notes (Signed)
Patient ID: Betty [name], female   DOB: Jul 11, 1956, 60 y.o.   MRN: [id number]  HPI: Betty FerrierHelen E [name] is a 60 y.o. female, returning for f/u for DM2, dx >10 years ago, insulin-dependent, uncontrolled, with complications (CAD, peripheral neuropathy). Last visit 3 mo ago.   Last hemoglobin A1c: Lab Results  Component Value Date   HGBA1C 8.3 03/06/2016   HGBA1C 8.9 08/15/2015   HGBA1C 9.1 05/02/2015   Pt is on a regimen of:  - NPH and Regular insulin as follows Breakfast  Lunch  Dinner bedtime  Insulin N: 34-37 units -  - 34-37 units  Insulin R: 20 units with a  smaller meal                 24 units wiith a  regular meal 20 units with a smaller meal 24 units with a larger meal 20 units with a smaller meal 24 units with a larger meal  -   - Metformin XR 500 mg 2x a day  - Victoza 1.8 mg daily  - started 04/2016 (still expensive)  We tried Bydureon 2 mg weekly (added 08/2015) >> not covered Was previously on 70/30 Insulin: 70-20-30 (split in 3 b/c she was dropping her sugars overnight). On these, she was still having lows in am (40s). She was on Novolog in the past but could not afford it.  She was also on Amaryl and Avandia. She has bladder dysfxn - cannot try SGLT2 inh.  She had diarrhea with regular metformin.  Pt checks her sugars 1-3x day (brings log) >> MUCH BETTER after starting Victoza: - am: 118-216 >> 124-214 >> 158-263 >> 133, 162-250 (may not be fasting) >> 134-194, 203 >> 83-152, 175 - 2h after b'fast:  >> 145, 215 >> n/c - before lunch:69x1,114-215 >> 115-195, 223 >> 157-215 >> 103-222 >> 169-249 >> 136-226, 268 (snacks) >> 74-142, 211 - 2h after lunch: 151, 199 >> n/c - before dinner: [id number] [id number] >> 152 >> 85, 109-172, 212 >> 105-227 >> 124-234 >> 83-154, 188, 249x1 >> 71, 81, 95-166 - bedtime: 180, 279 >> n/c >> 237 >> n/c >>> 87-199, 234 No lows; she has hypoglycemia awareness at 80.  She is not sure yet about gastric bypass surgery - has had presurgical  nutrition classes.   Pt does not have chronic kidney disease, last BUN/creatinine was:  Lab Results  Component Value Date   BUN 13 08/21/2014   Lab Results  Component Value Date   CREATININE 0.7 08/21/2014  She is on Ramipril.  Last set of lipids: Lab Results  Component Value Date   CHOL 167 08/21/2014   HDL 38.60 (L) 08/21/2014   LDLCALC 93 01/09/2013   LDLDIRECT 100.9 08/21/2014   TRIG 266.0 (H) 08/21/2014   CHOLHDL 4 08/21/2014   Pt's last eye exam was: Component Date Value  . [facility] Diabetic Eye Exam Dr Senaida Oresichardson 10/2015 - reportedly No Retinopathy - reportedly  - Has numbness and tingling in her legs, was on Lyrica but had to stop due to somnolence. Now on Neurontin 300 mg tid. Sees Dr Yetta BarreJones (podiatrist).   She also has a history of HL - on cholestyramine, pravastatin 40, omega 3 fish oil (managed by cardiology) ; HTN; Obesity, OSA, OA - on Osteobiflex. She was found to have vit D def.  I reviewed pt's medications, allergies, PMH, social hx, family hx, and changes were documented in the history of present illness. Otherwise, unchanged from my initial visit note.  ROS: Constitutional: +  Fatigue,+ nocturia Eyes: no blurry vision, no xerophthalmia ENT: no sore throat, no nodules palpated in throat, no dysphagia/odynophagia, no hoarseness Cardiovascular: no CP/SOB/palpitations/+ swelling in hands and feet Respiratory: no cough/SOB/wheezing  Gastrointestinal: no N/V/+ D/+ C Musculoskeletal: + muscle/+ joint aches Skin: no rashes, no itching Neurological: no tremors/numbness and tingling/dizziness, + HA  PE: BP 124/78 (BP Location: Right Arm, Patient Position: Sitting)   Pulse 89   Ht 5' 7.5" (1.715 m)   Wt 278 lb (126.1 kg)   SpO2 94%   BMI 42.90 kg/m  Body mass index is 42.9 kg/m. Wt Readings from Last 3 Encounters:  06/08/16 278 lb (126.1 kg)  03/06/16 286 lb 12.8 oz (130.1 kg)  03/02/16 285 lb 12.8 oz (129.6 kg)   Constitutional: obese class 3, in NAD,  pale Eyes: PERRLA, EOMI, no exophthalmos ENT: moist mucous membranes, no thyromegaly, no cervical lymphadenopathy Cardiovascular: RRR, No MRG, mild periankle edema Respiratory: CTA B Gastrointestinal: abdomen soft, NT, ND, BS+ Musculoskeletal: no deformities, strength intact in all 4 Skin: moist, warm, no rashes; thin hair Neurological: no tremor with outstretched hands, DTR normal in all 4  ASSESSMENT: 1. DM2, insulin-dependent, uncontrolled, with complications - CAD - s/p stent 11/2004 - Dr Eden Emms - peripheral neuropathy She told me that her previous endocrinologist filled out FMLA forms 2/2 uncontrolled DM2, but I cannot fill this since no major highs or lows or other issues.   PLAN:  1. DM2 Patient with long-standing insulin-dependent diabetes, with dramatic improvement in sugars after starting Victoza! Will continue this and advised her to try lower doses of insulin R (now using 24 units for each meal). - I advised her to: Patient Instructions   Please continue:  - NPH and Regular insulin as follows Breakfast  Lunch  Dinner bedtime  Insulin N: 34-37 units -  - 34-37 units  Insulin R: 20 units with a  smaller meal                 24 units wiith a  regular meal 20 units with a smaller meal 24 units with a larger meal 20 units with a smaller meal 24 units with a larger meal  -    - Metformin XR 500 mg 2x a day - Victoza 1.8 mg daily in am  Please come back for labs.  KEEP UP THE GREAT JOB!  Please return in 3 months with your sugar log.   - UTD with eye exams  - will need to obtain labs from PCP: CMP and Lipids (per pt, se had these checked in 06 or 04/2016) - check Hba1c today >> 7.3% (better!) - Return to clinic in 3 months with her sugar log  Betty Pavlov, MD PhD [facility]

## 2016-06-09 ENCOUNTER — Encounter: Payer: Self-pay | Admitting: Internal Medicine

## 2016-06-09 LAB — POCT GLYCOSYLATED HEMOGLOBIN (HGB A1C): HEMOGLOBIN A1C: 7.3

## 2016-06-09 NOTE — Addendum Note (Signed)
Addended by: Darene LamerHOMPSON, Naseem Varden T on: 06/09/2016 08:31 AM   Modules accepted: Orders

## 2016-06-12 ENCOUNTER — Encounter: Payer: Self-pay | Admitting: Internal Medicine

## 2016-06-12 ENCOUNTER — Telehealth: Payer: Self-pay | Admitting: Internal Medicine

## 2016-06-12 NOTE — Telephone Encounter (Signed)
Have we checked with Dr. Russella DarStark about the labs yet?

## 2016-06-18 ENCOUNTER — Other Ambulatory Visit: Payer: Self-pay | Admitting: Internal Medicine

## 2016-06-19 ENCOUNTER — Other Ambulatory Visit: Payer: Self-pay | Admitting: Internal Medicine

## 2016-06-25 ENCOUNTER — Telehealth: Payer: Self-pay

## 2016-06-25 NOTE — Telephone Encounter (Signed)
Patient called about lab results from PCP. Dr. Ailene RavelStarke is her PCP, and is under [facility]; All labs should be recording in patient chart. I do not see any other lab work that was drawn recently.

## 2016-08-13 ENCOUNTER — Other Ambulatory Visit: Payer: Self-pay | Admitting: Internal Medicine

## 2016-08-13 ENCOUNTER — Other Ambulatory Visit: Payer: Self-pay

## 2016-08-13 MED ORDER — METFORMIN HCL ER 500 MG PO TB24: 500.0000 mg | ORAL_TABLET | Freq: Three times a day (TID) | ORAL | 5 refills | Status: DC

## 2016-08-14 ENCOUNTER — Other Ambulatory Visit: Payer: Self-pay

## 2016-08-14 MED ORDER — GLUCOSE BLOOD VI STRP: ORAL_STRIP | 3 refills | Status: DC

## 2016-09-06 ENCOUNTER — Other Ambulatory Visit: Payer: Self-pay | Admitting: Internal Medicine

## 2016-10-08 ENCOUNTER — Other Ambulatory Visit: Payer: Self-pay | Admitting: Internal Medicine

## 2016-10-13 ENCOUNTER — Other Ambulatory Visit: Payer: Self-pay | Admitting: Cardiovascular Disease

## 2016-10-20 ENCOUNTER — Ambulatory Visit: Payer: Federal, State, Local not specified - PPO | Admitting: Internal Medicine

## 2016-11-27 ENCOUNTER — Other Ambulatory Visit: Payer: Self-pay

## 2016-11-27 ENCOUNTER — Telehealth: Payer: Self-pay | Admitting: Internal Medicine

## 2016-11-27 MED ORDER — LIRAGLUTIDE 18 MG/3ML ~~LOC~~ SOPN: PEN_INJECTOR | SUBCUTANEOUS | 3 refills | Status: DC

## 2016-11-27 NOTE — Telephone Encounter (Signed)
Completed.

## 2016-11-27 NOTE — Telephone Encounter (Signed)
[facility]  Calling to verify the dosage of medication liraglutide (VICTOZA) 18 MG/3ML SOPN  [facility] - [place], [place] - [address] [phone]) [phone] (Fax)

## 2016-12-01 ENCOUNTER — Other Ambulatory Visit: Payer: Self-pay | Admitting: Internal Medicine

## 2016-12-07 ENCOUNTER — Encounter: Payer: Self-pay | Admitting: Internal Medicine

## 2016-12-07 ENCOUNTER — Ambulatory Visit (INDEPENDENT_AMBULATORY_CARE_PROVIDER_SITE_OTHER): Payer: Federal, State, Local not specified - PPO | Admitting: Internal Medicine

## 2016-12-07 VITALS — BP 132/80 | HR 82 | Ht 67.0 in | Wt 277.0 lb

## 2016-12-07 DIAGNOSIS — IMO0002 Reserved for concepts with insufficient information to code with codable children: Secondary | ICD-10-CM

## 2016-12-07 DIAGNOSIS — Z794 Long term (current) use of insulin: Secondary | ICD-10-CM | POA: Diagnosis not present

## 2016-12-07 DIAGNOSIS — E1165 Type 2 diabetes mellitus with hyperglycemia: Secondary | ICD-10-CM | POA: Diagnosis not present

## 2016-12-07 DIAGNOSIS — E118 Type 2 diabetes mellitus with unspecified complications: Secondary | ICD-10-CM

## 2016-12-07 LAB — POCT GLYCOSYLATED HEMOGLOBIN (HGB A1C): Hemoglobin A1C: 7.7

## 2016-12-07 MED ORDER — GABAPENTIN 100 MG PO CAPS: 100.0000 mg | ORAL_CAPSULE | Freq: Every day | ORAL | 3 refills | Status: DC

## 2016-12-07 MED ORDER — INSULIN PEN NEEDLE 32G X 4 MM MISC: 3 refills | Status: DC

## 2016-12-07 NOTE — Progress Notes (Signed)
Patient ID: Betty [name], female   DOB: 19-Nov-1955, 61 y.o.   MRN: [id number]  HPI: Betty [name] is a 61 y.o. female, returning for f/u for DM2, dx >10 years ago, insulin-dependent, uncontrolled, with complications (CAD, peripheral neuropathy). Last visit 3 mo ago.   Last hemoglobin A1c: Lab Results  Component Value Date   HGBA1C 7.3 06/09/2016   HGBA1C 8.3 03/06/2016   HGBA1C 8.9 08/15/2015   Pt is on a regimen of:  - NPH and Regular insulin as follows Breakfast  Lunch  Dinner bedtime  Insulin N: 34-39 units -  - 36-39 units  Insulin R: 20 units with a  smaller meal                 24 units wiith a  regular meal 20 units with a smaller meal 24 units with a larger meal 20 units with a smaller meal 24 units with a larger meal  -   - Metformin XR 500 mg 2x a day  - Victoza 1.8 mg daily  - started 04/2016 (still expensive) >> ran out for 1 mo >> he just got it   We tried Bydureon 2 mg weekly (added 08/2015) >> not covered Was previously on 70/30 Insulin: 70-20-30 (split in 3 b/c she was dropping her sugars overnight). On these, she was still having lows in am (40s). She was on Novolog in the past but could not afford it.  She was also on Amaryl and Avandia. She has bladder dysfxn - cannot try SGLT2 inh.  She had diarrhea with regular metformin.  Pt checks her sugars 1-3x day (brings a good log): - am: 133, 162-250 (may not be fasting) >> 134-194, 203 >> 83-152, 175 >> 100-169, 238 - 2h after b'fast:  >> 145, 215 >> n/c - before lunch: 103-222 >> 169-249 >> 136-226, 268 (snacks) >> 74-142, 211 >>86, 96-169, 189,  211 (higher when close to b'fast) - 2h after lunch: 151, 199 >> n/c - before dinner: 105-227 >> 124-234 >> 83-154, 188, 249x1 >> 71, 81, 95-166 >> 94-154, 253 - bedtime: 180, 279 >> n/c >> 237 >> n/c >>> 87-199, 234 >> 187 No lows; she has hypoglycemia awareness at 80.  Pt does not have chronic kidney disease, last BUN/creatinine was:  Lab Results   Component Value Date   BUN 13 08/21/2014   Lab Results  Component Value Date   CREATININE 0.7 08/21/2014  She is on Ramipril.  Last set of lipids: Lab Results  Component Value Date   CHOL 167 08/21/2014   HDL 38.60 (L) 08/21/2014   LDLCALC 93 01/09/2013   LDLDIRECT 100.9 08/21/2014   TRIG 266.0 (H) 08/21/2014   CHOLHDL 4 08/21/2014   Pt's last eye exam was: Component Date Value  . [facility] Diabetic Eye Exam Dr Senaida Ores 10/2015 - reportedly No Retinopathy - reportedly  - Has numbness and tingling in her legs, was on Lyrica but had to stop due to somnolence. On Neurontin 300 mg prn now. Sees Dr Yetta Barre (podiatrist).   She also has a history of HL (managed by cardiology) ; HTN; Obesity, OSA, OA - on Osteobiflex, vit D def.  I reviewed pt's medications, allergies, PMH, social hx, family hx, and changes were documented in the history of present illness. Otherwise, unchanged from my initial visit note.  ROS: Constitutional: + Fatigue,+ nocturia Eyes: + blurry vision, no xerophthalmia ENT: no sore throat, no nodules palpated in throat, no dysphagia/odynophagia, + hoarseness Cardiovascular: no CP/SOB/palpitations/+ swelling  in hands and feet Respiratory: no cough/SOB/wheezing  Gastrointestinal: + N/V/+ D/+ C Musculoskeletal: + muscle/+ joint aches Skin: + rashes, + itching Neurological: no tremors/numbness and tingling/dizziness, + HA  PE: BP 132/80 (BP Location: Left Arm, Patient Position: Sitting)   Pulse 82   Ht 5\' 7"  (1.702 m)   Wt 277 lb (125.6 kg)   SpO2 91%   BMI 43.38 kg/m  Body mass index is 43.38 kg/m. Wt Readings from Last 3 Encounters:  12/07/16 277 lb (125.6 kg)  06/08/16 278 lb (126.1 kg)  03/06/16 286 lb 12.8 oz (130.1 kg)   Constitutional: obese class 3, in NAD, pale Eyes: PERRLA, EOMI, no exophthalmos ENT: moist mucous membranes, no thyromegaly, no cervical lymphadenopathy Cardiovascular: RRR, No MRG, mild periankle edema Respiratory: CTA  B Gastrointestinal: abdomen soft, NT, ND, BS+ Musculoskeletal: no deformities, strength intact in all 4 Skin: moist, warm, no rashes; thin hair Neurological: no tremor with outstretched hands, DTR normal in all 4  ASSESSMENT: 1. DM2, insulin-dependent, uncontrolled, with complications - CAD - s/p stent 11/2004 - Dr Eden EmmsNishan - peripheral neuropathy She told me that her previous endocrinologist filled out FMLA forms 2/2 uncontrolled DM2, but I cannot fill this since no major highs or lows or other issues.   2. PN  PLAN:  1. DM2 Patient with long-standing insulin-dependent diabetes, with dramatic improvement in sugars after starting Victoza, but she was off for last month as this is expensive >> given discount card today. She restarted it 2 days agio at 1.6 mg daily >> has nausea >> will decrease the dose to 0.6 mg and build it up. The 1.8 mg dose gave her constipation. - I advised her to: Patient Instructions   - NPH and Regular insulin as follows:  Breakfast  Lunch  Dinner bedtime  Insulin N: 34-39 units -  - 36-39 units  Insulin R: 20 units with a  smaller meal                 24 units wiith a  regular meal 20 units with a smaller meal 24 units with a larger meal 20 units with a smaller meal 24 units with a larger meal  -    - Metformin XR 500 mg 2x a day - Victoza 1.2 mg daily in am (back off to 0.6 mg daily for 2-3 days)  Please return in 3 months with your sugar log.   - needs a new eye exams - will need to obtain labs from PCP: CMP and Lipids (per pt, se had these checked in 06 or 04/2016) - check Hba1c today >> 7.7% (higher) - Return to clinic in 3 months with her sugar log  2. PN - she has somnolence from 300 mg Neurontin >> will call in a lower dose: 100 mg  Carlus Pavlovristina Myranda Pavone, MD PhD [facility]

## 2016-12-07 NOTE — Addendum Note (Signed)
Addended by: Darene LamerHOMPSON, Ayani Ospina T on: 12/07/2016 04:16 PM   Modules accepted: Orders

## 2016-12-07 NOTE — Patient Instructions (Addendum)
-   NPH and Regular insulin as follows:  Breakfast  Lunch  Dinner bedtime  Insulin N: 34-39 units -  - 36-39 units  Insulin R: 20 units with a  smaller meal                 24 units wiith a  regular meal 20 units with a smaller meal 24 units with a larger meal 20 units with a smaller meal 24 units with a larger meal  -    - Metformin XR 500 mg 2x a day - Victoza 1.2 mg daily in am (back off to 0.6 mg daily for 2-3 days)  Please return in 3 months with your sugar log.

## 2016-12-26 ENCOUNTER — Other Ambulatory Visit: Payer: Self-pay | Admitting: Internal Medicine

## 2017-02-21 ENCOUNTER — Other Ambulatory Visit: Payer: Self-pay | Admitting: Internal Medicine

## 2017-03-05 ENCOUNTER — Ambulatory Visit: Payer: Federal, State, Local not specified - PPO | Admitting: Internal Medicine

## 2017-03-05 DIAGNOSIS — Z0289 Encounter for other administrative examinations: Secondary | ICD-10-CM

## 2017-03-05 NOTE — Progress Notes (Deleted)
Patient ID: Betty [name], female   DOB: [birth year], 61 y.o.   MRN: [id number]  HPI: Betty [name] is a 61 y.o. female, returning for f/u for DM2, dx >10 years ago, insulin-dependent, uncontrolled, with complications (CAD, peripheral neuropathy). Last visit 3 months ago.  Last hemoglobin A1c: Lab Results  Component Value Date   HGBA1C 7.7 12/07/2016   HGBA1C 7.3 06/09/2016   HGBA1C 8.3 03/06/2016   Pt is on a regimen of:  - NPH and regular insulin: Breakfast  Lunch  Dinner bedtime  Insulin N: 34-39 units -  - 36-39 units  Insulin R: 20 units with a  smaller meal                 24 units wiith a  regular meal 20 units with a smaller meal 24 units with a larger meal 20 units with a smaller meal 24 units with a larger meal  -   - Metformin XR 500 mg 2x a day  - Victoza 1.8 >> 1.2 mg daily  - started 04/2016 (still expensive)   We tried Bydureon 2 mg weekly (added 08/2015) >> not covered Was previously on 70/30 Insulin: 70-20-30 (split in 3 b/c she was dropping her sugars overnight). On these, she was still having lows in am (40s). She was on Novolog in the past but could not afford it.  She was also on Amaryl and Avandia. She has bladder dysfxn - cannot try SGLT2 inh.  She had diarrhea with regular metformin.  Pt checks her sugars 1-3x day (brings a good log): - am:  83-152, 175 >> 100-169, 238 - 2h after b'fast:  >> 145, 215 >> n/c - before lunch: 86, 96-169, 189,  211 (higher when close to b'fast) - 2h after lunch: 151, 199 >> n/c - before dinner:  71, 81, 95-166 >> 94-154, 253 - bedtime: 237 >> n/c >>> 87-199, 234 >> 187 No lows; she has hypoglycemia awareness at 80.  No CKD, last BUN/creatinine was:  Lab Results  Component Value Date   BUN 13 08/21/2014   Lab Results  Component Value Date   CREATININE 0.7 08/21/2014  On ramipril.  Last set of lipids: Lab Results  Component Value Date   CHOL 167 08/21/2014   HDL 38.60 (L) 08/21/2014   LDLCALC 93  01/09/2013   LDLDIRECT 100.9 08/21/2014   TRIG 266.0 (H) 08/21/2014   CHOLHDL 4 08/21/2014   I reviewed most recent patient ophthalmologic exam: Component Date Value  . HM Diabetic Eye Exam Dr Senaida Ores 10/2015 - reportedly No Retinopathy - reportedly  - She does have numbness and tingling in her legs. She was previously on Lyrica but had to stop because of somnolence.. On Neurontin 100 mg prn. She had somnolence with the 300 mg dose. Sees Dr Yetta Barre (podiatrist).   She also has a history of HL (managed by cardiology) ; HTN; Obesity, OSA, OA - on Osteobiflex, vit D def.  ROS: Constitutional: no weight gain/no weight loss, no fatigue, no subjective hyperthermia, no subjective hypothermia Eyes: no blurry vision, no xerophthalmia ENT: no sore throat, no nodules palpated in throat, no dysphagia, no odynophagia, no hoarseness Cardiovascular: no CP/no SOB/no palpitations/no leg swelling Respiratory: no cough/no SOB/no wheezing Gastrointestinal: no N/no V/no D/no C/no acid reflux Musculoskeletal: no muscle aches/no joint aches Skin: no rashes, no hair loss Neurological: no tremors/no numbness/no tingling/no dizziness  I reviewed pt's medications, allergies, PMH, social hx, family hx, and changes were documented in the history of  present illness. Otherwise, unchanged from my initial visit note.  PE: There were no vitals taken for this visit. There is no height or weight on file to calculate BMI. Wt Readings from Last 3 Encounters:  12/07/16 277 lb (125.6 kg)  06/08/16 278 lb (126.1 kg)  03/06/16 286 lb 12.8 oz (130.1 kg)   Constitutional: Obese, in NAD Eyes: PERRLA, EOMI, no exophthalmos ENT: moist mucous membranes, no thyromegaly, no cervical lymphadenopathy Cardiovascular: RRR, No MRG Respiratory: CTA B Gastrointestinal: abdomen soft, NT, ND, BS+ Musculoskeletal: no deformities, strength intact in all 4 Skin: moist, warm, no rashes Neurological: no tremor with outstretched hands,  DTR normal in all 4  ASSESSMENT: 1. DM2, insulin-dependent, uncontrolled, with complications - CAD - s/p stent 11/2004 - Dr Eden EmmsNishan - peripheral neuropathy She told me that her previous endocrinologist filled out FMLA forms 2/2 uncontrolled DM2, but I cannot fill this since no major highs or lows or other issues.   2. PN  PLAN:  1. DM2 Patient with long-standing insulin-dependent diabetes, with dramatic improvement in sugars after starting Victoza, but then she had problems with insurance covering it and also had some nausea/constipation with it. At last visit I advised her to decrease the dose to 0.6 mg daily for few days and then increase back to 1.2 mg daily. We did not change her insulin doses at that time. - I advised her to: Patient Instructions   - NPH and Regular insulin as follows:  Breakfast  Lunch  Dinner bedtime  Insulin N: 34-39 units -  - 36-39 units  Insulin R: 20 units with a  smaller meal                 24 units wiith a  regular meal 20 units with a smaller meal 24 units with a larger meal 20 units with a smaller meal 24 units with a larger meal  -    - Metformin XR 500 mg 2x a day - Victoza 1.2 mg daily in am   Please return in 3 months with your sugar log.   - will need to obtain labs from PCP: CMP and Lipids (per pt, se had these checked in 06 or 04/2016) - today, HbA1c is 7%  - continue checking sugars at different times of the day - check 1x a day, rotating checks - advised for yearly eye exams >> she needs one - Return to clinic in 3 mo with sugar log    2. PN - At last visit, she had somnolence from the 300 mg of Neurontin so I called in 100 mg tablets  Carlus Pavlovristina Maricela Kawahara, MD PhD [facility]

## 2017-03-09 ENCOUNTER — Encounter: Payer: Self-pay | Admitting: Internal Medicine

## 2017-03-09 ENCOUNTER — Ambulatory Visit (INDEPENDENT_AMBULATORY_CARE_PROVIDER_SITE_OTHER): Payer: Federal, State, Local not specified - PPO | Admitting: Internal Medicine

## 2017-03-09 VITALS — BP 140/90 | HR 101 | Ht 67.0 in | Wt 278.0 lb

## 2017-03-09 DIAGNOSIS — E118 Type 2 diabetes mellitus with unspecified complications: Secondary | ICD-10-CM | POA: Diagnosis not present

## 2017-03-09 DIAGNOSIS — IMO0002 Reserved for concepts with insufficient information to code with codable children: Secondary | ICD-10-CM

## 2017-03-09 DIAGNOSIS — E1165 Type 2 diabetes mellitus with hyperglycemia: Secondary | ICD-10-CM

## 2017-03-09 DIAGNOSIS — Z794 Long term (current) use of insulin: Secondary | ICD-10-CM | POA: Diagnosis not present

## 2017-03-09 LAB — POCT GLYCOSYLATED HEMOGLOBIN (HGB A1C): Hemoglobin A1C: 7.3

## 2017-03-09 NOTE — Progress Notes (Addendum)
Patient ID: Betty [name], female   DOB: 07-18-1956, 61 y.o.   MRN: [id number]  HPI: Betty FerrierHelen E [name] is a 61 y.o. female, returning for f/u for DM2, dx >10 years ago, insulin-dependent, uncontrolled, with complications (CAD, peripheral neuropathy). Last visit 3 mo ago.  Last hemoglobin A1c: Lab Results  Component Value Date   HGBA1C 7.7 12/07/2016   HGBA1C 7.3 06/09/2016   HGBA1C 8.3 03/06/2016   Pt is on a regimen of:  - NPH and regular insulin: Breakfast  Lunch  Dinner bedtime  Insulin N: 34-39 units -  - 36-39 units  Insulin R: 20 units with a  smaller meal                 24 units wiith a  regular meal 20 units with a smaller meal 24 units with a larger meal 20 units with a smaller meal 24 units with a larger meal  -   - Metformin XR 500 mg 2-3x a day  - Victoza 1.8 >> 1.2 mg daily  - started 04/2016 (still expensive)   We tried Bydureon 2 mg weekly (added 08/2015) >> not covered Was previously on 70/30 Insulin: 70-20-30 (split in 3 b/c she was dropping her sugars overnight). On these, she was still having lows in am (40s). She was on Novolog in the past but could not afford it.  She was also on Amaryl and Avandia. She has bladder dysfxn - cannot try SGLT2 inh.  She had diarrhea with regular metformin.  Pt checks her sugars 1-3x day (reviewed her great log): - am:  83-152, 175 >> 100-169, 238 >> 67, 97-175 - 2h after b'fast:  >> 145, 215 >> n/c - before lunch: 86, 96-169, 189,  211 (higher when close to b'fast) >> 99-168, 182, 195 - 2h after lunch: 151, 199 >> n/c - before dinner:  71, 81, 95-166 >> 94-154, 253 >> 82-140, 178, 198 - bedtime: 237 >> n/c >>> 87-199, 234 >> 187 >> n/c No lows; she has hypoglycemia awareness at 80.  No CKD, last BUN/creatinine was:  Lab Results  Component Value Date   BUN 13 08/21/2014   Lab Results  Component Value Date   CREATININE 0.7 08/21/2014  On Ramipril.  Last set of lipids: Lab Results  Component Value Date    CHOL 167 08/21/2014   HDL 38.60 (L) 08/21/2014   LDLCALC 93 01/09/2013   LDLDIRECT 100.9 08/21/2014   TRIG 266.0 (H) 08/21/2014   CHOLHDL 4 08/21/2014   I reviewed most recent patient ophthalmologic exam: Component Date Value  . HM Diabetic Eye Exam Dr Senaida Oresichardson 10/2015 - reportedly No Retinopathy - reportedly  - She does have numbness and tingling in her legs. She was previously on Lyrica but had to stop because of somnolence.Now on Neurontin 100 mg prn. She had somnolence with the 300 mg dose.Betty [name]. Sees Dr Yetta BarreJones (podiatrist).   She also has a history of HL (managed by cardiology) ; HTN; Obesity, OSA, OA - on Osteobiflex, vit D def.  ROS: Constitutional: no weight gain/no weight loss, + fatigue, no subjective hyperthermia, no subjective hypothermia Eyes: + blurry vision, no xerophthalmia ENT: no sore throat, no nodules palpated in throat, no dysphagia, no odynophagia, + hoarseness Cardiovascular: no CP/no SOB/no palpitations/+ leg swelling Respiratory: + cough/no SOB/no wheezing Gastrointestinal: + N/no V/+ D/+ C/no acid reflux Musculoskeletal: + muscle aches/+ joint aches Skin: no rashes, no hair loss Neurological: no tremors/no numbness/no tingling/no dizziness  I reviewed pt's medications, allergies,  PMH, social hx, family hx, and changes were documented in the history of present illness. Otherwise, unchanged from my initial visit note.  PE: BP 140/90 (BP Location: Left Arm, Patient Position: Sitting)   Pulse (!) 101   Ht 5\' 7"  (1.702 m)   Wt 278 lb (126.1 kg)   SpO2 97%   BMI 43.54 kg/m  Body mass index is 43.54 kg/m. Wt Readings from Last 3 Encounters:  03/09/17 278 lb (126.1 kg)  12/07/16 277 lb (125.6 kg)  06/08/16 278 lb (126.1 kg)   Constitutional: obese, in NAD Eyes: PERRLA, EOMI, no exophthalmos ENT: moist mucous membranes, no thyromegaly, no cervical lymphadenopathy Cardiovascular: RRR, No MRG Respiratory: CTA B Gastrointestinal: abdomen soft, NT, ND,  BS+ Musculoskeletal: no deformities, strength intact in all 4 Skin: moist, warm, no rashes Neurological: no tremor with outstretched hands, DTR normal in all 4   ASSESSMENT: 1. DM2, insulin-dependent, uncontrolled, with complications - CAD - s/p stent 11/2004 - Dr Eden Emms - peripheral neuropathy She told me that her previous endocrinologist filled out FMLA forms 2/2 uncontrolled DM2, but I cannot fill this since no major highs or lows or other issues.   2. PN  PLAN:  1. DM2 Patient with long-standing insulin-dependent diabetes, with dramatic improvement in her sugars after starting Victoza, but she had been problems with insurance covering it and also had some nausea and constipation with it. At last visit, I advised her to decrease the dose to 0.6 mg daily for a few days and then increase back to 1.2 mg daily. We did not change her insulin doses at that time. She is now few clicks above the 1.2 mg >> tolerates this well. Continues to try to increase this to 1.8. - I advised her to: Patient Instructions   Please continue: - NPH and Regular insulin as follows: Breakfast  Lunch  Dinner bedtime  Insulin N: 34-39 units -  - 36-39 units  Insulin R: 20 units with a  smaller meal                 24 units wiith a  regular meal 20 units with a smaller meal 24 units with a larger meal 20 units with a smaller meal 24 units with a larger meal  -    - Metformin XR 500 mg 2x a day - Victoza 1.2 mg daily in am  Please return in 3 months with your sugar log.    - will need to obtain labs from PCP: CMP and Lipids (Per patient, she had these checked in 03/12/2016) - today, HbA1c is 7.3%  - Continue checking sugars at different times of the day-check 3 times a day, rotating check times. - We again discussed about the need for yearly eye exam. She needs one. - Return to clinic in 3 months with her sugar log   2. PN - At at last visit, she had somnolence with a 300 mg of Neurontin, so I called  in the 100 mg tablet. She is taking this dose now.  Carlus Pavlov, MD PhD [facility]

## 2017-03-09 NOTE — Patient Instructions (Addendum)
Please continue: - NPH and Regular insulin as follows: Breakfast  Lunch  Dinner bedtime  Insulin N: 34-39 units -  - 36-39 units  Insulin R: 20 units with a  smaller meal                 24 units wiith a  regular meal 20 units with a smaller meal 24 units with a larger meal 20 units with a smaller meal 24 units with a larger meal  -    - Metformin XR 500 mg 2x a day - Victoza 1.2 mg daily in am  Please return in 3 months with your sugar log.

## 2017-03-09 NOTE — Addendum Note (Signed)
Addended by: Darene LamerHOMPSON, Zaida Reiland T on: 03/09/2017 02:57 PM   Modules accepted: Orders

## 2017-04-28 ENCOUNTER — Other Ambulatory Visit (HOSPITAL_BASED_OUTPATIENT_CLINIC_OR_DEPARTMENT_OTHER): Payer: Self-pay

## 2017-04-28 DIAGNOSIS — R0683 Snoring: Secondary | ICD-10-CM

## 2017-04-28 DIAGNOSIS — R5383 Other fatigue: Secondary | ICD-10-CM

## 2017-04-28 DIAGNOSIS — G4710 Hypersomnia, unspecified: Secondary | ICD-10-CM

## 2017-05-05 ENCOUNTER — Other Ambulatory Visit: Payer: Self-pay | Admitting: Internal Medicine

## 2017-05-18 ENCOUNTER — Other Ambulatory Visit: Payer: Self-pay | Admitting: Internal Medicine

## 2017-06-17 ENCOUNTER — Ambulatory Visit (INDEPENDENT_AMBULATORY_CARE_PROVIDER_SITE_OTHER): Payer: Federal, State, Local not specified - PPO | Admitting: Internal Medicine

## 2017-06-17 ENCOUNTER — Encounter: Payer: Self-pay | Admitting: Internal Medicine

## 2017-06-17 VITALS — BP 130/84 | HR 90 | Ht 67.0 in | Wt 285.8 lb

## 2017-06-17 DIAGNOSIS — E1142 Type 2 diabetes mellitus with diabetic polyneuropathy: Secondary | ICD-10-CM

## 2017-06-17 DIAGNOSIS — E118 Type 2 diabetes mellitus with unspecified complications: Secondary | ICD-10-CM

## 2017-06-17 DIAGNOSIS — E1165 Type 2 diabetes mellitus with hyperglycemia: Secondary | ICD-10-CM | POA: Diagnosis not present

## 2017-06-17 DIAGNOSIS — IMO0002 Reserved for concepts with insufficient information to code with codable children: Secondary | ICD-10-CM

## 2017-06-17 DIAGNOSIS — Z794 Long term (current) use of insulin: Secondary | ICD-10-CM

## 2017-06-17 LAB — COMPLETE METABOLIC PANEL WITH GFR
AG RATIO: 1.4 (calc) (ref 1.0–2.5)
ALT: 28 U/L (ref 6–29)
AST: 21 U/L (ref 10–35)
Albumin: 4 g/dL (ref 3.6–5.1)
Alkaline phosphatase (APISO): 75 U/L (ref 33–130)
BILIRUBIN TOTAL: 0.4 mg/dL (ref 0.2–1.2)
BUN: 16 mg/dL (ref 7–25)
CALCIUM: 9.2 mg/dL (ref 8.6–10.4)
CHLORIDE: 99 mmol/L (ref 98–110)
CO2: 28 mmol/L (ref 20–32)
Creat: 0.7 mg/dL (ref 0.50–0.99)
GFR, EST AFRICAN AMERICAN: 108 mL/min/{1.73_m2} (ref >=60)
GFR, Est Non African American: 93 mL/min/{1.73_m2} (ref >=60)
GLUCOSE: 144 mg/dL — AB (ref 65–99)
Globulin: 2.9 g/dL (calc) (ref 1.9–3.7)
POTASSIUM: 4.3 mmol/L (ref 3.5–5.3)
Sodium: 136 mmol/L (ref 135–146)
TOTAL PROTEIN: 6.9 g/dL (ref 6.1–8.1)

## 2017-06-17 LAB — POCT GLYCOSYLATED HEMOGLOBIN (HGB A1C): HEMOGLOBIN A1C: 7

## 2017-06-17 NOTE — Progress Notes (Addendum)
Patient ID: CABRINA SHIROMA, female   DOB: [birth year], 61 y.o.   MRN: [id number]  HPI: SULEYMA WAFER is a 61 y.o. female, returning for f/u for DM2, dx >10 years ago, insulin-dependent, uncontrolled, with complications (CAD, peripheral neuropathy). Last visit 3 mo ago.  Since last visit, she complains of more constipation due to which she had to decrease the dose of Victoza and even come off her period of time, now building it back up.   Reviewed latest hemoglobin A1c: Lab Results  Component Value Date   HGBA1C 7.3 03/09/2017   HGBA1C 7.7 12/07/2016   HGBA1C 7.3 06/09/2016   Pt is on a regimen of:  - NPH and Regular insulin as follows: Breakfast  Lunch  Dinner bedtime  Insulin N: 36-39 units -  - 36-39 units  Insulin R: 20 units with a  smaller meal                 23 units wiith a  regular meal 20 units with a smaller meal 23 units with a larger meal 20 units with a smaller meal 23 units with a larger meal  -    - Metformin XR 500 mg 2x a day - Victoza 0.9 mg daily in am (0.6 + 5 clicks) -had to back off 2/2 constipation  We tried Bydureon 2 mg weekly (added 08/2015) >> not covered Was previously on 70/30 Insulin: 70-20-30 (split in 3 b/c she was dropping her sugars overnight). On these, she was still having lows in am (40s). She was on Novolog in the past but could not afford it.  She was also on Amaryl and Avandia. She has bladder dysfxn - cannot try SGLT2 inh.  She had diarrhea with regular metformin.  Pt checks her sugars 1-3x a day (reviewed her log): - am:  83-152, 175 >> 100-169, 238 >> 67, 97-175 >> 80-103-149, 197 - 2h after b'fast:  >> 145, 215 >> n/c - before lunch: 86, 96-169, 189,  211>> 99-168, 182, 195 >> 92-143, 174 - 2h after lunch: 151, 199 >> n/c - before dinner:  71, 81, 95-166 >> 94-154, 253 >> 82-140, 178, 198 >> 92-145, 202  - bedtime: 237 >> n/c >>> 87-199, 234 >> 187 >> n/c + lows in the 50s - 2x since last visit ; she has hypoglycemia  awareness at 80.  No CKD, last BUN/creatinine was:  Lab Results  Component Value Date   BUN 13 08/21/2014   Lab Results  Component Value Date   CREATININE 0.7 08/21/2014  On Ramipril.  Last set of lipids: Lab Results  Component Value Date   CHOL 167 08/21/2014   HDL 38.60 (L) 08/21/2014   LDLCALC 93 01/09/2013   LDLDIRECT 100.9 08/21/2014   TRIG 266.0 (H) 08/21/2014   CHOLHDL 4 08/21/2014  On Pravacol. - eye exam: most recent: Component Date Value  . HM Diabetic Eye Exam Dr Senaida Ores 10/2015 - reportedly No DR reportedly  - She does have numbness and tingling in her legs. She was previously on Lyrica but had to stop because of somnolence. Now on Neurontin 100 mg As needed. She had somnolence with the 300 mg dose. Sees Dr Yetta Barre (podiatrist).   She also has a history of HL (managed by cardiology) ; HTN; Obesity, OSA, OA - on Osteobiflex, vit D def.  ROS: Constitutional: + weight gain/no weight loss, +fatigue, no subjective hyperthermia, no subjective hypothermia Eyes: + blurry vision, no xerophthalmia ENT: no sore throat, no nodules palpated  in throat, no dysphagia, no odynophagia, + hoarseness Cardiovascular: no CP/no SOB/no palpitations/+ leg swelling Respiratory: + cough/no SOB/no wheezing Gastrointestinal: + Nausea, heartburn, constipation Musculoskeletal: + Muscle aches and joint aches Skin: no rashes, no hair loss Neurological: no tremors/no numbness/no tingling/no dizziness, + HA  I reviewed pt's medications, allergies, PMH, social hx, family hx, and changes were documented in the history of present illness. Otherwise, unchanged from my initial visit note.  PE: BP 130/84   Pulse 90   Ht  (1.702 m)   Wt 285 lb 12.8 oz (129.6 kg)   SpO2 97%   BMI 44.76 kg/m  Body mass index is 44.76 kg/m. Wt Readings from Last 3 Encounters:  06/17/17 285 lb 12.8 oz (129.6 kg)  03/09/17 278 lb (126.1 kg)  12/07/16 277 lb (125.6 kg)   Constitutional: obese, in  NAD Eyes: PERRLA, EOMI, no exophthalmos ENT: moist mucous membranes, no thyromegaly, no cervical lymphadenopathy Cardiovascular: RRR, No MRG Respiratory: CTA B Gastrointestinal: abdomen soft, NT, ND, BS+ Musculoskeletal: no deformities, strength intact in all 4 Skin: moist, warm, no rashes Neurological: no tremor with outstretched hands, DTR normal in all 4  ASSESSMENT: 1. DM2, insulin-dependent, uncontrolled, with complications - CAD - s/p stent 11/2004 - Dr Eden Emms - peripheral neuropathy She told me that her previous endocrinologist filled out FMLA forms 2/2 uncontrolled DM2, but I cannot fill this since no major highs or lows or other issues.   2. PN  PLAN:  1. DM2- Patient with long-standing insulin-dependent diabetes, with dramatic improvement in her sugars after starting Victoza,  But which is fluctuating the dose due to her constipation. She is currently building it up from 0.6 mg daily. - Taking a complex regimen of an and are insulin doses, documenting her sugars and her doses with each meal and as a consequence, her sugars are fairly well-controlled. They have actually improved from last visit, especially in the morning.  - We'll continue the current regimen,  however, since she occasionally has lows especially after taking insulin when the sugars are lower before meals, we discussed about the protocol to decrease the chance of hypo-and hyperglycemia if her sugars are lower than 90 before meals - I advised her to: Patient Instructions   Please continue: - NPH and Regular insulin as follows:  Breakfast  Lunch  Dinner bedtime  Insulin N: 36-39 units -  - 36-39 units  Insulin R: 20 units with a  smaller meal                 23 units wiith a  regular meal 20 units with a smaller meal 23 units with a larger meal 20 units with a smaller meal 23 units with a larger meal  -   Please take: 71-90: take 16 units of R insulin 50-70: do not take R insulin  - Metformin XR 500 mg  2x a day - Victoza 1.8 mg daily in am  Please return in 3 months with your sugar log.   - today, HbA1c is 7%  (better) - continue checking sugars at different times of the day - check 3x a day, rotating checks - advised for yearly eye exams >> she is UTD - Return to clinic in 3 mo with sugar log    2. PN - she had somnolence with the 300 mg of Neurontin >> changed to 100 mg tablet >> continues this now with  less somnolence   Component     Latest Ref Rng &  Units 06/17/2017  Glucose     65 - 99 mg/dL 161144 (H)  BUN     7 - 25 mg/dL 16  Creatinine     0.960.50 - 0.99 mg/dL 0.450.70  GFR, Est Non African American     > OR = 60 mL/min/1.8873m2 93  GFR, Est African American     > OR = 60 mL/min/1.7873m2 108  BUN/Creatinine Ratio     6 - 22 (calc) NOT APPLICABLE  Sodium     135 - 146 mmol/L 136  Potassium     3.5 - 5.3 mmol/L 4.3  Chloride     98 - 110 mmol/L 99  CO2     20 - 32 mmol/L 28  Calcium     8.6 - 10.4 mg/dL 9.2  Total Protein     6.1 - 8.1 g/dL 6.9  Albumin MSPROF     3.6 - 5.1 g/dL 4.0  Globulin     1.9 - 3.7 g/dL (calc) 2.9  AG Ratio     1.0 - 2.5 (calc) 1.4  Total Bilirubin     0.2 - 1.2 mg/dL 0.4  Alkaline phosphatase (APISO)     33 - 130 U/L 75  AST     10 - 35 U/L 21  ALT     6 - 29 U/L 28  Cholesterol     0 - 200 mg/dL 409189  Triglycerides     0.0 - 149.0 mg/dL 811.9302.0 (H)  HDL Cholesterol     >39.00 mg/dL 14.7837.40 (L)  VLDL     0.0 - 40.0 mg/dL 29.560.4 (H)  Total CHOL/HDL Ratio      5  NonHDL      151.18  Hemoglobin A1C      7.0  Direct LDL     mg/dL 621.3109.0   Lipids are higher, the rest of the labs are at goal.  Carlus Pavlovristina Kaeo Jacome, MD PhD [facility]

## 2017-06-17 NOTE — Patient Instructions (Addendum)
Please continue: - NPH and Regular insulin as follows:  Breakfast  Lunch  Dinner bedtime  Insulin N: 36-39 units -  - 36-39 units  Insulin R: 20 units with a  smaller meal                 23 units wiith a  regular meal 20 units with a smaller meal 23 units with a larger meal 20 units with a smaller meal 23 units with a larger meal  -   Please take: 71-90: take 16 units of R insulin 50-70: do not take R insulin  - Metformin XR 500 mg 2x a day - Victoza 1.8 mg daily in am  Please return in 3 months with your sugar log.

## 2017-06-18 LAB — LDL CHOLESTEROL, DIRECT: Direct LDL: 109 mg/dL

## 2017-06-18 LAB — LIPID PANEL
CHOL/HDL RATIO: 5
Cholesterol: 189 mg/dL (ref 0–200)
HDL: 37.4 mg/dL — ABNORMAL LOW (ref >39.00)
NONHDL: 151.18
TRIGLYCERIDES: 302 mg/dL — AB (ref 0.0–149.0)
VLDL: 60.4 mg/dL — ABNORMAL HIGH (ref 0.0–40.0)

## 2017-06-22 ENCOUNTER — Telehealth: Payer: Self-pay

## 2017-06-22 NOTE — Telephone Encounter (Signed)
LVM, gave lab results. Gave call back number if any questions or concerns.  

## 2017-06-22 NOTE — Telephone Encounter (Signed)
-----   Message from Carlus Pavlovristina Gherghe, MD sent at 06/18/2017  4:41 PM EDT ----- Raynelle FanningJulie, can you please call pt: Lipids are a little higher, the rest of the labs are at goal. Please decrease concentrated sweets and fats in diet. Continue Pravachol.

## 2017-06-25 ENCOUNTER — Encounter (HOSPITAL_BASED_OUTPATIENT_CLINIC_OR_DEPARTMENT_OTHER): Payer: Federal, State, Local not specified - PPO

## 2017-07-19 ENCOUNTER — Other Ambulatory Visit: Payer: Self-pay

## 2017-07-19 MED ORDER — INSULIN REGULAR HUMAN 100 UNIT/ML IJ SOLN
INTRAMUSCULAR | 1 refills | Status: DC
Start: 2017-07-19 — End: 2017-12-31

## 2017-07-19 MED ORDER — INSULIN REGULAR HUMAN 100 UNIT/ML IJ SOLN: INTRAMUSCULAR | 1 refills | Status: DC

## 2017-08-03 ENCOUNTER — Other Ambulatory Visit: Payer: Self-pay | Admitting: Internal Medicine

## 2017-08-06 ENCOUNTER — Ambulatory Visit (HOSPITAL_BASED_OUTPATIENT_CLINIC_OR_DEPARTMENT_OTHER): Payer: Federal, State, Local not specified - PPO | Attending: Family | Admitting: Internal Medicine

## 2017-08-06 VITALS — Ht 67.0 in | Wt 278.0 lb

## 2017-08-06 DIAGNOSIS — G4733 Obstructive sleep apnea (adult) (pediatric): Secondary | ICD-10-CM | POA: Diagnosis not present

## 2017-08-06 DIAGNOSIS — R0683 Snoring: Secondary | ICD-10-CM | POA: Insufficient documentation

## 2017-08-06 DIAGNOSIS — G478 Other sleep disorders: Secondary | ICD-10-CM | POA: Diagnosis present

## 2017-08-06 DIAGNOSIS — R5383 Other fatigue: Secondary | ICD-10-CM | POA: Diagnosis not present

## 2017-08-06 DIAGNOSIS — G4710 Hypersomnia, unspecified: Secondary | ICD-10-CM | POA: Insufficient documentation

## 2017-08-15 DIAGNOSIS — R0683 Snoring: Secondary | ICD-10-CM

## 2017-08-15 NOTE — Procedures (Signed)
Patient Name: Betty [name], Betty [name] Study Date: 08/06/2017 Gender: Female D.O.B: [birth year] Age (years): 61 Referring Provider: Malachy Chamberakia Starkes Height (inches): 67 Interpreting Physician: Jetty Duhamellinton Young MD, ABSM Weight (lbs): 278 RPSGT: Rolene ArbourMcConnico, Yvonne BMI: 44 MRN: [id number] Neck Size: 18.00 CLINICAL INFORMATION Sleep Study Type: NPSG  Indication for sleep study: Fatigue  Epworth Sleepiness Score: 18  SLEEP STUDY TECHNIQUE As per the AASM Manual for the Scoring of Sleep and Associated Events v2.3 (April 2016) with a hypopnea requiring 4% desaturations.  The channels recorded and monitored were frontal, central and occipital EEG, electrooculogram (EOG), submentalis EMG (chin), nasal and oral airflow, thoracic and abdominal wall motion, anterior tibialis EMG, snore microphone, electrocardiogram, and pulse oximetry.  MEDICATIONS Medications self-administered by patient taken the night of the study : BENADRYL, OSTEO BIFLEX, CALCIUM GLUCONATE W/VITAMIN D, METFORMIN, GABAPENTIN, ZYRTEC, MELOXICAM, ONDANSETRON, LEVOCETIRIZINE, NOVOLIN R.  SLEEP ARCHITECTURE The study was initiated at 10:23:38 PM and ended at 4:57:23 AM.  Sleep onset time was 10.9 minutes and the sleep efficiency was 73.5%. The total sleep time was 289.5 minutes.  Stage REM latency was 124.0 minutes.  The patient spent 1.55% of the night in stage N1 sleep, 73.06% in stage N2 sleep, 1.90% in stage N3 and 23.49% in REM.  Alpha intrusion was absent.  Supine sleep was 28.72%.  RESPIRATORY PARAMETERS The overall apnea/hypopnea index (AHI) was 8.1 per hour. There were 0 total apneas, including 0 obstructive, 0 central and 0 mixed apneas. There were 39 hypopneas and 3 RERAs.  The AHI during Stage REM sleep was 24.7 per hour.  AHI while supine was 18.8 per hour.  The mean oxygen saturation was 88.31%. The minimum SpO2 during sleep was 56.00%.  soft snoring was noted during this study.  CARDIAC DATA The 2 lead EKG  demonstrated sinus rhythm. The mean heart rate was 74.99 beats per minute. Other EKG findings include: None.  LEG MOVEMENT DATA The total PLMS were 33 with a resulting PLMS index of 6.84. Associated arousal with leg movement index was 0.6 .  IMPRESSIONS - Mild obstructive sleep apnea occurred during this study (AHI = 8.1/h), mostly during REM sleep.. - No significant central sleep apnea occurred during this study (CAI = 0.0/h). - Severe oxygen desaturation was noted during REM related apneas (Min O2 = 56.00%). Supplemental O2  1L added at 0400AM - The patient snored with soft snoring volume. - No cardiac abnormalities were noted during this study. - Mild periodic limb movements of sleep occurred during the study. No significant associated arousals. - Patient was awakened by pain.  DIAGNOSIS - Obstructive Sleep Apnea (327.23 [G47.33 ICD-10]) - Nocturnal Hypoxemia (327.26 [G47.36 ICD-10]) - Pain  RECOMMENDATIONS - Therapy for mild sleep apnea is directed at symptoms. If conservative measures are insufficient, then CPAP, a fitted oral appliance or ENT evaluation might be considered. - Positional therapy avoiding supine position during sleep. - Consider options for pain management. - Be careful with alcohol, sedatives and other CNS depressants that may worsen sleep apnea and disrupt normal sleep architecture. - Sleep hygiene should be reviewed to assess factors that may improve sleep quality. - Weight management and regular exercise should be initiated or continued if appropriate.  [Electronically signed] 08/15/2017 12:46 PM  Jetty Duhamellinton Young MD, ABSM Diplomate, American Board of Sleep Medicine   NPI: [id number]

## 2017-08-18 ENCOUNTER — Other Ambulatory Visit: Payer: Self-pay | Admitting: Internal Medicine

## 2017-09-21 ENCOUNTER — Ambulatory Visit: Payer: Federal, State, Local not specified - PPO | Admitting: Internal Medicine

## 2017-09-27 ENCOUNTER — Other Ambulatory Visit: Payer: Self-pay | Admitting: Internal Medicine

## 2017-10-26 ENCOUNTER — Other Ambulatory Visit: Payer: Self-pay | Admitting: Internal Medicine

## 2017-10-27 ENCOUNTER — Telehealth: Payer: Self-pay | Admitting: Internal Medicine

## 2017-10-27 NOTE — Telephone Encounter (Signed)
Patient daughter stated that her mom need a 90 day supply of the 3 pack Victoza  Call [facility] [phone]

## 2017-10-28 MED ORDER — LIRAGLUTIDE 18 MG/3ML ~~LOC~~ SOPN: PEN_INJECTOR | SUBCUTANEOUS | 1 refills | Status: DC

## 2017-10-28 NOTE — Telephone Encounter (Signed)
Pt confirmed pharmacy, medications sent and f/up appt made

## 2017-10-28 NOTE — Telephone Encounter (Signed)
Patient is returning your call.  

## 2017-10-28 NOTE — Telephone Encounter (Signed)
[facility] does not show up under pharmacies, when speaking with pt she stated they have never used this company nor spoken with them before the insurance just gave them the number. Asked pt to call and get set up with this mail order pharmacy and then let us know if we can send electronically or if it has to be faxed to them. She stated she will call back when she speaks with them

## 2017-10-28 NOTE — Telephone Encounter (Signed)
LMTCB

## 2017-11-10 ENCOUNTER — Other Ambulatory Visit: Payer: Self-pay | Admitting: Internal Medicine

## 2017-11-10 NOTE — Telephone Encounter (Signed)
OK 

## 2017-11-10 NOTE — Telephone Encounter (Signed)
Is this okay to refill? 

## 2017-12-31 ENCOUNTER — Ambulatory Visit (INDEPENDENT_AMBULATORY_CARE_PROVIDER_SITE_OTHER): Payer: Federal, State, Local not specified - PPO | Admitting: Internal Medicine

## 2017-12-31 ENCOUNTER — Encounter: Payer: Self-pay | Admitting: Internal Medicine

## 2017-12-31 VITALS — BP 140/74 | HR 97 | Ht 67.0 in | Wt 279.0 lb

## 2017-12-31 DIAGNOSIS — E785 Hyperlipidemia, unspecified: Secondary | ICD-10-CM

## 2017-12-31 DIAGNOSIS — E1165 Type 2 diabetes mellitus with hyperglycemia: Secondary | ICD-10-CM

## 2017-12-31 DIAGNOSIS — Z794 Long term (current) use of insulin: Secondary | ICD-10-CM | POA: Diagnosis not present

## 2017-12-31 DIAGNOSIS — E1142 Type 2 diabetes mellitus with diabetic polyneuropathy: Secondary | ICD-10-CM

## 2017-12-31 DIAGNOSIS — E118 Type 2 diabetes mellitus with unspecified complications: Secondary | ICD-10-CM

## 2017-12-31 DIAGNOSIS — IMO0002 Reserved for concepts with insufficient information to code with codable children: Secondary | ICD-10-CM

## 2017-12-31 LAB — POCT GLYCOSYLATED HEMOGLOBIN (HGB A1C): Hemoglobin A1C: 7.6

## 2017-12-31 MED ORDER — "INSULIN SYRINGE-NEEDLE U-100 31G X 15/64"" 1 ML MISC": 3 refills | Status: DC

## 2017-12-31 NOTE — Progress Notes (Signed)
Patient ID: ACELYNN DEJONGE, female   DOB: [birth year], 62 y.o.   MRN: [id number]  HPI: JEMIAH CUADRA is a 62 y.o. female, returning for f/u for DM2, dx >10 years ago, insulin-dependent, uncontrolled, with complications (CAD, peripheral neuropathy). Last visit 6 mo ago.  She had a URI and gastroenteritis this Winter.   Reviewed latest hemoglobin A1c: Lab Results  Component Value Date   HGBA1C 7.0 06/17/2017   HGBA1C 7.3 03/09/2017   HGBA1C 7.7 12/07/2016   Pt is on a regimen of:  - NPH and Regular insulin as follows:  Breakfast  Lunch  Dinner bedtime  Insulin N: 36-39 units -  - 36-39 units  Insulin R: 20 units with a  smaller meal                 23 units wiith a  regular meal 20 units with a smaller meal 23 units with a larger meal 20 units with a smaller meal 23 units with a larger meal  -   Please take: 71-90: take 16 units of R insulin 50-70: do not take R insulin  - Metformin XR 500 mg 2x a day - Victoza 1.8 mg daily at night (!)  We tried Bydureon 2 mg weekly (added 08/2015) >> not covered Was previously on 70/30 Insulin: 70-20-30 (split in 3 b/c she was dropping her sugars overnight). On these, she was still having lows in am (40s). She was on Novolog in the past but could not afford it.  She was also on Amaryl and Avandia. She has bladder dysfxn - cannot try SGLT2 inh.  She had diarrhea with regular metformin.  Pt checks her sugars 1-3x a day per her log: - am:   67, 97-175 >> 80-103-149, 197 >> 68, 70-155, 204 - 2h after b'fast:  >> 145, 215 >> n/c - before lunch:  99-168, 182, 195 >> 92-143, 174 >> 84-160, 242 - 2h after lunch: 151, 199 >> n/c - before dinner: 82-140, 178, 198 >> 92-145, 202 >> 92-160, 192 - bedtime: 87-199, 234 >> 187 >> n/c >> 114, 160 Lowest CBG:50s >> 68 ; she has hypoglycemia awareness in the 80s  No CKD, last BUN/creatinine was:  Lab Results  Component Value Date   BUN 16 06/17/2017   Lab Results  Component Value Date    CREATININE 0.70 06/17/2017  On ramipril.  + HL; Last set of lipids: Lab Results  Component Value Date   CHOL 189 06/17/2017   HDL 37.40 (L) 06/17/2017   LDLCALC 93 01/09/2013   LDLDIRECT 109.0 06/17/2017   TRIG 302.0 (H) 06/17/2017   CHOLHDL 5 06/17/2017  On Pravachol. - eye exam: most recent: 10/2015: No DR; Dr Senaida Ores - + numbness and tingling in her legs. She was previously on Lyrica but had to stop because of somnolence. Now on Neurontin 100 mg prn - she hadhad somnolence with the 300 mg dose. Sees Dr Yetta Barre (podiatrist).   She also has a history of HL (managed by cardiology) ; HTN; Obesity, OSA, OA - on Osteobiflex, vit D def.  ROS: Constitutional: no weight gain/no weight loss, no fatigue, no subjective hyperthermia, no subjective hypothermia Eyes: no blurry vision, no xerophthalmia ENT: no sore throat, no nodules palpated in throat, no dysphagia, no odynophagia, no hoarseness Cardiovascular: no CP/no SOB/no palpitations/no leg swelling Respiratory: no cough/no SOB/no wheezing Gastrointestinal: no N/no V/no D/no C/no acid reflux Musculoskeletal: no muscle aches/no joint aches Skin: no rashes, no hair loss Neurological: no tremors/+  numbness/+ tingling/no dizziness  I reviewed pt's medications, allergies, PMH, social hx, family hx, and changes were documented in the history of present illness. Otherwise, unchanged from my initial visit note.  PE: BP 140/74   Pulse 97   Ht 5\' 7"  (1.702 m)   Wt 279 lb (126.6 kg)   SpO2 97%   BMI 43.70 kg/m  Body mass index is 43.7 kg/m. Wt Readings from Last 3 Encounters:  12/31/17 279 lb (126.6 kg)  08/07/17 278 lb (126.1 kg)  06/17/17 285 lb 12.8 oz (129.6 kg)   Constitutional: overweight, in NAD Eyes: PERRLA, EOMI, no exophthalmos ENT: moist mucous membranes, no thyromegaly, no cervical lymphadenopathy Cardiovascular: tachycardia, RR, No MRG Respiratory: CTA B Gastrointestinal: abdomen soft, NT, ND, BS+ Musculoskeletal:  no deformities, strength intact in all 4 Skin: moist, warm, no rashes Neurological: no tremor with outstretched hands, DTR normal in all 4  ASSESSMENT: 1. DM2, insulin-dependent, uncontrolled, with complications - CAD - s/p stent 11/2004 - Dr Eden EmmsNishan - peripheral neuropathy She told me that her previous endocrinologist filled out FMLA forms 2/2 uncontrolled DM2, but I cannot fill this since no major highs or lows or other issues.   2. PN  3. HL  PLAN:  1. DM2- Patient with long standing insulin-dep. DM, on basal-bolus insulin regimen + GLP1 R agonist and also Metformin.  We are limited in her GLP-1 receptor agonist dosing due to her constipation.  Despite the complex regimen, she is doing a good job with checking her sugars and taking her insulin injections as prescribed.  Therefore, her diabetes is under fair control HbA1c being 7.0%. - At this visit, sugars are occasionally higher than target as she reduced her R insulin doses a little and also moved Victoza at night.  She did not remember that she had to take it in the morning.  She was increasing the dose slowly and she is currently using 1.2 mg +9 clicks daily, which is almost 1.8 mg daily.  She will start 1.8 mg daily tomorrow.  I advised her to move this in the morning, which I think will help with postprandial sugars. - I advised her to: Patient Instructions   Please continue: - Metformin XR 500 mg 3x a day - Victoza 1.8 mg daily but move it to am - NPH and Regular insulin as follows:  Breakfast  Lunch  Dinner bedtime  Insulin N: 36 units -  - 36 units  Insulin R: 16-23 units  16-23 units  16-23 units   -   If sugars are: 71-90: take 16 units of R insulin 50-70: do not take R insulin  Please return in 4 months with your sugar log.   - today, HbA1c is 7.6% (higher) - continue checking sugars at different times of the day - check 3x a day, rotating checks - advised for yearly eye exams >> she is due - Return to clinic in  4 mo with sugar log   2. PN -She had somnolence with 300 mg of Neurontin, but now she is doing well in the 100 mg tablet.    3. HL - reviewed lipids from 06/2017: LDL higher than target and triglycerides quite high, in the 300s. -Continues Pravachol without side effects  Carlus Pavlovristina Gabryela Kimbrell, MD PhD [facility]

## 2017-12-31 NOTE — Patient Instructions (Signed)
Please continue: - Metformin XR 500 mg 3x a day - Victoza 1.8 mg daily but move it to am - NPH and Regular insulin as follows:  Breakfast  Lunch  Dinner bedtime  Insulin N: 36 units -  - 36 units  Insulin R: 16-23 units  16-23 units  16-23 units   -   If sugars are 50-70: do not take R insulin  Please return in 4 months with your sugar log.

## 2018-02-16 ENCOUNTER — Other Ambulatory Visit: Payer: Self-pay | Admitting: Cardiovascular Disease

## 2018-03-15 ENCOUNTER — Other Ambulatory Visit: Payer: Self-pay | Admitting: Internal Medicine

## 2018-04-06 ENCOUNTER — Other Ambulatory Visit: Payer: Self-pay | Admitting: Internal Medicine

## 2018-04-07 ENCOUNTER — Other Ambulatory Visit: Payer: Self-pay | Admitting: Internal Medicine

## 2018-04-11 ENCOUNTER — Emergency Department: Payer: Federal, State, Local not specified - PPO

## 2018-04-11 ENCOUNTER — Emergency Department
Admission: EM | Admit: 2018-04-11 | Discharge: 2018-04-12 | Disposition: A | Discharge status: Home / Self Care | Payer: Federal, State, Local not specified - PPO | Attending: Emergency Medicine | Admitting: Emergency Medicine

## 2018-04-11 ENCOUNTER — Encounter: Payer: Self-pay | Admitting: *Deleted

## 2018-04-11 ENCOUNTER — Other Ambulatory Visit: Payer: Self-pay

## 2018-04-11 DIAGNOSIS — Z9049 Acquired absence of other specified parts of digestive tract: Secondary | ICD-10-CM | POA: Diagnosis not present

## 2018-04-11 DIAGNOSIS — I2510 Atherosclerotic heart disease of native coronary artery without angina pectoris: Secondary | ICD-10-CM | POA: Insufficient documentation

## 2018-04-11 DIAGNOSIS — Z79899 Other long term (current) drug therapy: Secondary | ICD-10-CM | POA: Insufficient documentation

## 2018-04-11 DIAGNOSIS — Z7982 Long term (current) use of aspirin: Secondary | ICD-10-CM | POA: Insufficient documentation

## 2018-04-11 DIAGNOSIS — I10 Essential (primary) hypertension: Secondary | ICD-10-CM | POA: Insufficient documentation

## 2018-04-11 DIAGNOSIS — Z955 Presence of coronary angioplasty implant and graft: Secondary | ICD-10-CM | POA: Insufficient documentation

## 2018-04-11 DIAGNOSIS — E119 Type 2 diabetes mellitus without complications: Secondary | ICD-10-CM | POA: Insufficient documentation

## 2018-04-11 DIAGNOSIS — R110 Nausea: Secondary | ICD-10-CM

## 2018-04-11 DIAGNOSIS — R42 Dizziness and giddiness: Secondary | ICD-10-CM

## 2018-04-11 DIAGNOSIS — R1033 Periumbilical pain: Secondary | ICD-10-CM | POA: Insufficient documentation

## 2018-04-11 DIAGNOSIS — Z794 Long term (current) use of insulin: Secondary | ICD-10-CM | POA: Insufficient documentation

## 2018-04-11 LAB — BASIC METABOLIC PANEL
Anion gap: 9 (ref 5–15)
BUN: 11 mg/dL (ref 8–23)
CALCIUM: 9.1 mg/dL (ref 8.9–10.3)
CO2: 27 mmol/L (ref 22–32)
CREATININE: 0.49 mg/dL (ref 0.44–1.00)
Chloride: 103 mmol/L (ref 98–111)
GFR calc non Af Amer: >60 mL/min (ref >60)
Glucose, Bld: 235 mg/dL — ABNORMAL HIGH (ref 70–99)
Potassium: 4.3 mmol/L (ref 3.5–5.1)
Sodium: 139 mmol/L (ref 135–145)

## 2018-04-11 LAB — HEPATIC FUNCTION PANEL
ALK PHOS: 78 U/L (ref 38–126)
ALT: 42 U/L (ref 0–44)
AST: 36 U/L (ref 15–41)
Albumin: 3.9 g/dL (ref 3.5–5.0)
BILIRUBIN INDIRECT: 1.2 mg/dL — AB (ref 0.3–0.9)
Bilirubin, Direct: 0.2 mg/dL (ref 0.0–0.2)
TOTAL PROTEIN: 7.7 g/dL (ref 6.5–8.1)
Total Bilirubin: 1.4 mg/dL — ABNORMAL HIGH (ref 0.3–1.2)

## 2018-04-11 LAB — URINALYSIS, COMPLETE (UACMP) WITH MICROSCOPIC
BACTERIA UA: NONE SEEN
Bilirubin Urine: NEGATIVE
Glucose, UA: >=500 mg/dL — AB
HGB URINE DIPSTICK: NEGATIVE
Ketones, ur: 5 mg/dL — AB
Leukocytes, UA: NEGATIVE
Nitrite: NEGATIVE
PROTEIN: 30 mg/dL — AB
SPECIFIC GRAVITY, URINE: 1.009 (ref 1.005–1.030)
pH: 8 (ref 5.0–8.0)

## 2018-04-11 LAB — GLUCOSE, CAPILLARY: GLUCOSE-CAPILLARY: 217 mg/dL — AB (ref 70–99)

## 2018-04-11 LAB — CBC
HCT: 40.4 % (ref 35.0–47.0)
Hemoglobin: 13.7 g/dL (ref 12.0–16.0)
MCH: 30.5 pg (ref 26.0–34.0)
MCHC: 33.9 g/dL (ref 32.0–36.0)
MCV: 90.2 fL (ref 80.0–100.0)
Platelets: 258 10*3/uL (ref 150–440)
RBC: 4.48 MIL/uL (ref 3.80–5.20)
RDW: 14 % (ref 11.5–14.5)
WBC: 11.4 10*3/uL — ABNORMAL HIGH (ref 3.6–11.0)

## 2018-04-11 LAB — LIPASE, BLOOD: Lipase: 29 U/L (ref 11–51)

## 2018-04-11 LAB — TROPONIN I: Troponin I: <0.03 ng/mL (ref <0.03)

## 2018-04-11 MED ORDER — PROMETHAZINE HCL 25 MG/ML IJ SOLN
6.2500 mg | Freq: Once | INTRAMUSCULAR | Status: AC
  Administered 2018-04-11: 6.25 mg via INTRAMUSCULAR
  Filled 2018-04-11: qty 1

## 2018-04-11 MED ORDER — IOPAMIDOL (ISOVUE-300) INJECTION 61%
30.0000 mL | Freq: Once | INTRAVENOUS | Status: AC
  Administered 2018-04-11: 30 mL via ORAL

## 2018-04-11 MED ORDER — ONDANSETRON HCL 4 MG/2ML IJ SOLN
4.0000 mg | Freq: Once | INTRAMUSCULAR | Status: AC
  Administered 2018-04-11: 4 mg via INTRAVENOUS
  Filled 2018-04-11: qty 2

## 2018-04-11 MED ORDER — IOPAMIDOL (ISOVUE-300) INJECTION 61%
125.0000 mL | Freq: Once | INTRAVENOUS | Status: AC | PRN
  Administered 2018-04-11: 125 mL via INTRAVENOUS

## 2018-04-11 MED ORDER — SODIUM CHLORIDE 0.9 % IV BOLUS
500.0000 mL | Freq: Once | INTRAVENOUS | Status: AC
  Administered 2018-04-11: 500 mL via INTRAVENOUS

## 2018-04-11 NOTE — ED Triage Notes (Signed)
Pt c/o n/v and dizziness. Pt states CBG'S at home have been low. Pt last ate yesterday at lunch. Pt takes oral and injection insulin. Pt states she has been unable to eat or drink anything since yesterday at noon. Pt denies falling at this time. Pt c/o nausea presently. Pt is pale, denies chest pain and shortness of breath.

## 2018-04-11 NOTE — ED Notes (Signed)
Patient transported to CT 

## 2018-04-11 NOTE — ED Notes (Signed)
Updated pt with plan of care. Pt still drinking po contrast. Reports nausea is improving. Call bell within reach, will continue to monitor.

## 2018-04-11 NOTE — ED Notes (Signed)
Notified CT that pt finished po contrast.

## 2018-04-12 ENCOUNTER — Other Ambulatory Visit: Payer: Self-pay | Admitting: Internal Medicine

## 2018-04-12 MED ORDER — MECLIZINE HCL 12.5 MG PO TABS: 12.5000 mg | ORAL_TABLET | Freq: Three times a day (TID) | ORAL | 0 refills | Status: DC | PRN

## 2018-04-12 NOTE — ED Provider Notes (Signed)
[facility] Emergency Department Provider Note   ____________________________________________   First MD Initiated Contact with Patient 04/11/18 1918     (approximate)  I have reviewed the triage vital signs and the nursing notes.   HISTORY  Chief Complaint Dizziness, occasional nausea   HPI Betty [name] is a 62 y.o. female reports for several days may be given a few weeks to a month that she is been getting nauseated off and on.  Not really certain why, reports that she started Victoza a while ago and feels like that might be related but not really sure.  She has not fallen, but she reports at times she is feeling nauseated and lightheaded.  Symptoms seem a little bit worse when she is up and moving around.  Today she vomited, and did not have much of an appetite.  She does use insulin, and is noticed that her blood sugars have been lower than normal but ranging about the 80-100 range which she reports normally her blood sugars are higher.  No fevers or chills.  No chest pain or trouble breathing.  No headache but some nausea off and on, seems slightly worse with movement at times.  Occasional achiness in her stomach, but denies any black or bloody stool.  Is not vomiting up any black or coffee-ground and only vomited one time today.  She reports she took Zofran 4 mg prior to arrival but did not really help much.    Past Medical History:  Diagnosis Date  . CAD, NATIVE VESSEL   . DM (diabetes mellitus) (HCC)   . Fibromyalgia   . HYPERLIPIDEMIA-MIXED   . HYPERTENSION, BENIGN   . Obesity   . Osteoarthritis   . Sleep apnea    Patient reported on 04/12/13    Patient Active Problem List   Diagnosis Date Noted  . Diabetic peripheral neuropathy (HCC) 06/17/2017  . Uncontrolled type 2 diabetes mellitus with complication, with long-term current use of insulin (HCC) 03/06/2016  . Osteoarthritis   . Obesity   . Sleep apnea   . Hyperlipidemia 07/31/2009   . HYPERTENSION, BENIGN 07/31/2009  . CAD, NATIVE VESSEL 07/31/2009    Past Surgical History:  Procedure Laterality Date  . BREATH TEK H PYLORI N/A 03/08/2013   Procedure: BREATH TEK H PYLORI;  Surgeon: Atilano InaEric M Wilson, MD;  Location: [facility];  Service: General;  Laterality: N/A;  . CESAREAN SECTION    . CHOLECYSTECTOMY  1996  . CORONARY ANGIOPLASTY WITH STENT PLACEMENT  11/2004   DES - LAD  . CORONARY STENT PLACEMENT  11/2004    Prior to Admission medications   Medication Sig Start Date End Date Taking? Authorizing Provider  acetaminophen (TYLENOL ARTHRITIS PAIN) 650 MG CR tablet Take 650 mg by mouth as needed for pain.    [provider]  aspirin 81 MG tablet Take 81 mg by mouth daily.      [provider]  beta carotene [id number] UNIT capsule Take 25,000 Units by mouth daily.      [provider]  Calcium Carbonate-Vitamin D (CALCIUM-VITAMIN D) 600-200 MG-UNIT CAPS Take 1 tablet by mouth daily.     [provider]  cetirizine (ZYRTEC) 10 MG tablet Take 10 mg by mouth daily.    [provider]  diphenhydrAMINE (BENADRYL) 25 mg capsule Take 25 mg by mouth every 6 (six) hours as needed for itching.    [provider]  fluorometholone (FML) 0.1 % ophthalmic suspension Place 1 drop  into both eyes daily. Reported on 03/06/2016 08/28/14   [provider]  gabapentin (NEURONTIN) 100 MG capsule TAKE 1 CAPSULE BY MOUTH AT BEDTIME 11/10/17   Carlus Pavlov, MD  insulin NPH Human (NOVOLIN N RELION) 100 UNIT/ML injection INJECT 36-39 UNITS SUBCUTANEOUSLY TWICE DAILY 10/26/17   Carlus Pavlov, MD  Insulin Pen Needle (BD PEN NEEDLE NANO U/F) 32G X 4 MM MISC USE ONCE DAILY 03/15/18   Carlus Pavlov, MD  insulin regular (NOVOLIN R RELION) 100 units/mL injection INJECT 20-24 UNITS SUBCUTANEOUSLY 30 MINUTES BEFORE MEALS 07/19/17   Romero Belling, MD  Insulin Syringe-Needle U-100 (BD SAFETYGLIDE INSULIN SYRINGE) 31G X 15/64" 1 ML MISC Use 4  times a day as advised 12/31/17   Carlus Pavlov, MD  liraglutide (VICTOZA) 18 MG/3ML SOPN INJECT 1.8MG  DAILY 04/08/18   Carlus Pavlov, MD  Magnesium 250 MG TABS Take 1 tablet by mouth daily.      [provider]  meclizine (ANTIVERT) 12.5 MG tablet Take 1 tablet (12.5 mg total) by mouth 3 (three) times daily as needed for dizziness or nausea. 04/12/18   Sharyn Creamer, MD  meloxicam (MOBIC) 7.5 MG tablet Take 7.5 mg by mouth daily.    [provider]  metFORMIN (GLUCOPHAGE-XR) 500 MG 24 hr tablet TAKE 1 TABLET BY MOUTH 3 TIMES DAILY WITH MEALS 08/18/17   Carlus Pavlov, MD  Misc Natural Products (OSTEO BI-FLEX JOINT SHIELD PO) Take 1 tablet by mouth daily.      [provider]  Omega-3 Fatty Acids (FISH OIL CONCENTRATE PO) Take 1 tablet by mouth daily.      [provider]  ondansetron (ZOFRAN) 4 MG tablet Take 4 mg by mouth every 8 (eight) hours as needed for nausea or vomiting.    [provider]  pravastatin (PRAVACHOL) 40 MG tablet TAKE 1 TABLET BY MOUTH DAILY 10/14/16   Wendall Stade, MD  Pumpkin Seed-Soy Germ (AZO BLADDER CONTROL/GO-LESS PO) Take 2 each by mouth.    [provider]  ramipril (ALTACE) 10 MG capsule TAKE 1 CAPSULE BY MOUTH DAILY 04/06/18   Carlus Pavlov, MD  Sherryl Barters PRO TEST test strip TEST BLOOD SUGAR 5 TIMES DAILY AS INSTRUCTED 09/27/17   Carlus Pavlov, MD    Allergies Lyrica [pregabalin]  Family History  Problem Relation Age of Onset  . Cancer Unknown   . Coronary artery disease Unknown   . Diabetes Unknown   . Heart disease Mother   . Cancer Mother        ovarian  . Arthritis Mother   . Diabetes Mother   . Hypertension Mother   . Hypertension Father   . Stroke Father   . Diabetes Sister     Social History Social History   Tobacco Use  . Smoking status: Never Smoker  . Smokeless tobacco: Never Used  Substance Use Topics  . Alcohol use: No  . Drug use: No    Review of  Systems Constitutional: No fever/chills Eyes: No visual changes. ENT: No sore throat. Cardiovascular: Denies chest pain. Respiratory: Denies shortness of breath. Gastrointestinal:  No diarrhea.  No constipation. Genitourinary: Negative for dysuria. Musculoskeletal: Negative for back pain. Skin: Negative for rash. Neurological: Negative for headaches, focal weakness or numbness.  No speech changes.  No facial droop.    ____________________________________________   PHYSICAL EXAM:  VITAL SIGNS: ED Triage Vitals  Enc Vitals Group     BP 04/11/18 1845 (!) 166/71     Pulse Rate 04/11/18 1845 75  Resp 04/11/18 1845 16     Temp 04/11/18 1845 97.9 F (36.6 C)     Temp Source 04/11/18 1845 Oral     SpO2 04/11/18 1845 96 %     Weight 04/11/18 1848 274 lb (124.3 kg)     Height 04/11/18 1848 5\' 7"  (1.702 m)     Head Circumference --      Peak Flow --      Pain Score 04/11/18 1846 4     Pain Loc --      Pain Edu? --      Excl. in GC? --     Constitutional: Alert and oriented. Well appearing and in no acute distress.  She does appear to slightly nauseated, but in no distress.  Very pleasant.  Stable hemodynamics with a nontoxic appearance. Eyes: Conjunctivae are normal. Head: Atraumatic. Nose: No congestion/rhinnorhea. Mouth/Throat: Mucous membranes are moist. Neck: No stridor.   Cardiovascular: Normal rate, regular rhythm. Grossly normal heart sounds.  Good peripheral circulation. Respiratory: Normal respiratory effort.  No retractions. Lungs CTAB. Gastrointestinal: Soft and some mild periumbilical tenderness but no rebound or guarding.  No focal tenderness except periumbilically VersaPort some nausea.. No distention. Musculoskeletal: No lower extremity tenderness nor edema. Neurologic:  Normal speech and language. No gross focal neurologic deficits are appreciated.  She moves all extremities well.  Equal and symmetric grips.  Normal strength in extremities bilateral.  No  ataxia. Skin:  Skin is warm, dry and intact. No rash noted. Psychiatric: Mood and affect are normal. Speech and behavior are normal.  ____________________________________________   LABS (all labs ordered are listed, but only abnormal results are displayed)  Labs Reviewed  GLUCOSE, CAPILLARY - Abnormal; Notable for the following components:      Result Value   Glucose-Capillary 217 (*)    All other components within normal limits  BASIC METABOLIC PANEL - Abnormal; Notable for the following components:   Glucose, Bld 235 (*)    All other components within normal limits  CBC - Abnormal; Notable for the following components:   WBC 11.4 (*)    All other components within normal limits  URINALYSIS, COMPLETE (UACMP) WITH MICROSCOPIC - Abnormal; Notable for the following components:   Color, Urine STRAW (*)    APPearance CLEAR (*)    Glucose, UA >=500 (*)    Ketones, ur 5 (*)    Protein, ur 30 (*)    All other components within normal limits  HEPATIC FUNCTION PANEL - Abnormal; Notable for the following components:   Total Bilirubin 1.4 (*)    Indirect Bilirubin 1.2 (*)    All other components within normal limits  LIPASE, BLOOD  TROPONIN I  CBG MONITORING, ED  CBG MONITORING, ED   ____________________________________________  EKG  Reviewed enterotomy at 1842 Heart rate 80 Care is 99 QTc 450 Normal sinus rhythm no evidence of ischemia or ectopy. ____________________________________________  RADIOLOGY  Dg Chest 2 View  Result Date: 04/11/2018 CLINICAL DATA:  Weakness with possible infection. EXAM: CHEST - 2 VIEW COMPARISON:  01/04/2013 chest radiograph FINDINGS: The cardiomediastinal silhouette is unremarkable. There is no evidence of focal airspace disease, pulmonary edema, suspicious pulmonary nodule/mass, pleural effusion, or pneumothorax. No acute bony abnormalities are identified. IMPRESSION: No active cardiopulmonary disease. Electronically Signed   By: Harmon Pier M.D.    On: 04/11/2018 20:38   Ct Head Wo Contrast  Result Date: 04/11/2018 CLINICAL DATA:  Vertigo with nausea, vomiting and dizziness. EXAM: CT HEAD WITHOUT CONTRAST TECHNIQUE: Contiguous axial  images were obtained from the base of the skull through the vertex without intravenous contrast. COMPARISON:  None. FINDINGS: Brain: There is no mass, hemorrhage or extra-axial collection. The size and configuration of the ventricles and extra-axial CSF spaces are normal. There is no acute or chronic infarction. The brain parenchyma is normal. Vascular: No abnormal hyperdensity of the major intracranial arteries or dural venous sinuses. No intracranial atherosclerosis. Skull: The visualized skull base, calvarium and extracranial soft tissues are normal. Sinuses/Orbits: No fluid levels or advanced mucosal thickening of the visualized paranasal sinuses. No mastoid or middle ear effusion. The orbits are normal. IMPRESSION: Normal aging brain. Electronically Signed   By: Deatra Robinson M.D.   On: 04/11/2018 23:47   Ct Abdomen Pelvis W Contrast  Result Date: 04/11/2018 CLINICAL DATA:  62 year old female with acute abdominal pain and vomiting. EXAM: CT ABDOMEN AND PELVIS WITH CONTRAST TECHNIQUE: Multidetector CT imaging of the abdomen and pelvis was performed using the standard protocol following bolus administration of intravenous contrast. CONTRAST:  ISOVUE-300 IOPAMIDOL (ISOVUE-300) INJECTION 61% COMPARISON:  04/11/2018 radiographs FINDINGS: Lower chest: No acute abnormality. Hepatobiliary: Hepatic steatosis identified without focal hepatic abnormality. The patient is status post cholecystectomy. No biliary dilatation. Pancreas: Unremarkable Spleen: Unremarkable Adrenals/Urinary Tract: The kidneys, adrenal glands and bladder are unremarkable except for a LEFT renal cyst. Stomach/Bowel: Stomach is within normal limits. Appendix appears normal. No evidence of bowel wall thickening, distention, or inflammatory changes.  Vascular/Lymphatic: Aortic atherosclerosis. No enlarged abdominal or pelvic lymph nodes. Reproductive: Uterus and bilateral adnexa are unremarkable. Other: No ascites, abscess or pneumoperitoneum. Musculoskeletal: No acute or suspicious bony abnormalities. IMPRESSION: 1. No acute abnormality 2. Hepatic steatosis 3.  Aortic Atherosclerosis (ICD10-I70.0). Electronically Signed   By: Harmon Pier M.D.   On: 04/11/2018 23:56   Dg Abd 2 Views  Result Date: 04/11/2018 CLINICAL DATA:  Acute nausea and vomiting. EXAM: ABDOMEN - 2 VIEW COMPARISON:  None. FINDINGS: The bowel gas pattern is normal. There is no evidence of free air. No radio-opaque calculi or other significant radiographic abnormality is seen. IMPRESSION: Negative. Electronically Signed   By: Harmon Pier M.D.   On: 04/11/2018 20:40   CT of the head, abdominal films and abdomen pelvis reviewed by me. ____________________________________________   PROCEDURES  Procedure(s) performed: None  Procedures  Critical Care performed: No  ____________________________________________   INITIAL IMPRESSION / ASSESSMENT AND PLAN / ED COURSE  Pertinent labs & imaging results that were available during my care of the patient were reviewed by me and considered in my medical decision making (see chart for details).  Patient presents for several symptoms, reports nausea intermittently, now some feeling of lightheadedness, occasional vomiting stomach discomfort.  She is diabetic and reports her blood sugar was slightly lower but not hypoglycemic.  Differential diagnosis for symptoms are very broad, though her overall appearance is nontoxic and fairly well-appearing.  Not complaining any specific cardiac neurologic or pulmonary symptoms.  Afebrile.  Clinical Course as of Apr 12 129  Mon Apr 11, 2018  1933 Alert, denies insect bites or ticks. Reports nausea, frequent vomiting. Lightheaded since yesterday. No headaches. No chest pain, cough, or sob.     [MQ]  2138 Patient reports when she moved to get an x-ray she started feeling dizzy and lightheaded.  She then got nauseated and vomited once.  She denies headache, but reports she is also feeling dizzy at times for the last couple of days.  Seems to be slightly worse when she moves her head.  Currently her symptoms are improved but remains somewhat nauseated.  No abdominal pain but reports frequent vomiting and nausea.  At this point discussed with the patient and will proceed with CT scan of the head and abdomen pelvis to evaluate and exclude potential for a small ischemic event though I suspect unlikely she does not have any central neurologic symptom or focal abnormality.  In addition CT abdomen pelvis, diabetic now with nausea and vomiting evaluate and exclude intra-abdominal etiology and obstruction.   [MQ]    Clinical Course User Index [MQ] Sharyn Creamer, MD   Discussed plan for discharge with the patient, she is comfortable with that.  Possible she may be expensing symptoms of vertigo given the lightheadedness and nausea.  She feels well at the present time.  She does live alone, and she discussed needing assistance arranging transport, comfortable with a plan for taxi rider or "[employer]oad" she reports her daughter can help arrange.  She appears stable for discharge, very reassuring and there were evaluation based on her symptomatology.  Did not see evidence of acute cardiac neurologic pulmonary etiology or acute infectious/toxic abnormality.  Discussed careful return precautions with the patient, will provide meclizine and see if this provides some relief of her dizziness symptoms.  Follow-up closely with her primary doctor she identifies in [place].  Return precautions and treatment recommendations and follow-up discussed with the patient who is agreeable with the plan.   ____________________________________________   FINAL CLINICAL IMPRESSION(S) / ED DIAGNOSES  Final diagnoses:  Dizziness    Nausea      NEW MEDICATIONS STARTED DURING THIS VISIT:  Discharge Medication List as of 04/12/2018 12:31 AM    START taking these medications   Details  meclizine (ANTIVERT) 12.5 MG tablet Take 1 tablet (12.5 mg total) by mouth 3 (three) times daily as needed for dizziness or nausea., Starting Tue 04/12/2018, Print         Note:  This document was prepared using Dragon voice recognition software and may include unintentional dictation errors.     Sharyn Creamer, MD 04/12/18 [phone]

## 2018-04-12 NOTE — Telephone Encounter (Signed)
ok 

## 2018-04-12 NOTE — Discharge Instructions (Addendum)
? ?  Please return to the emergency room right away if you are to develop a fever, severe nausea, your pain becomes severe or worsens, you are unable to keep food down, begin vomiting any dark or bloody fluid, you develop any dark or bloody stools, feel dehydrated, or other new concerns or symptoms arise. ? ?

## 2018-04-12 NOTE — Telephone Encounter (Signed)
Is this okay to refill? 

## 2018-04-28 ENCOUNTER — Telehealth: Payer: Self-pay | Admitting: Internal Medicine

## 2018-04-28 NOTE — Telephone Encounter (Signed)
Sorry to hear that ! I doubt it is the Victoza, since she has been on this for a long time now... We can try without Victoza for a week a until she feels better, and then she can restart it.  However, in this period, she may need to increase her insulin: - NPH: 36 units 2x a day >> 42 units 2x a day - Regular: 16-23 units 3x a day before meals >> 25-27 units before meals

## 2018-04-28 NOTE — Telephone Encounter (Signed)
Pt states that she is having watery eyes, nasal drainage down her throat, room is spinning, and vomitting. This lasted 4 days, she has been to the emergency room and seen her PCP. She said shes somewhat better but can not drive. She said she feels like shes been "drinking and staggering"   Sugars are in the 200s and she said that is unusual for her.   She feels this is from the Victoza. Please advise.

## 2018-04-28 NOTE — Telephone Encounter (Signed)
Pt is aware states she will do this change then discuss at appointment

## 2018-04-28 NOTE — Telephone Encounter (Signed)
Patient has been out of work since 1st of July due to being sick. She states she read about the symptoms of the medication she is taking and believes that could be the reason she is so sick Please advise       liraglutide (VICTOZA) 18 MG/3ML SOPN

## 2018-05-03 ENCOUNTER — Ambulatory Visit: Payer: Federal, State, Local not specified - PPO | Admitting: Internal Medicine

## 2018-05-03 ENCOUNTER — Encounter: Payer: Self-pay | Admitting: Internal Medicine

## 2018-05-03 VITALS — BP 122/70 | HR 80 | Ht 67.0 in | Wt 277.0 lb

## 2018-05-03 DIAGNOSIS — E785 Hyperlipidemia, unspecified: Secondary | ICD-10-CM

## 2018-05-03 DIAGNOSIS — E1142 Type 2 diabetes mellitus with diabetic polyneuropathy: Secondary | ICD-10-CM | POA: Diagnosis not present

## 2018-05-03 DIAGNOSIS — E1165 Type 2 diabetes mellitus with hyperglycemia: Secondary | ICD-10-CM | POA: Diagnosis not present

## 2018-05-03 DIAGNOSIS — Z794 Long term (current) use of insulin: Secondary | ICD-10-CM | POA: Diagnosis not present

## 2018-05-03 DIAGNOSIS — E118 Type 2 diabetes mellitus with unspecified complications: Secondary | ICD-10-CM | POA: Diagnosis not present

## 2018-05-03 DIAGNOSIS — IMO0002 Reserved for concepts with insufficient information to code with codable children: Secondary | ICD-10-CM

## 2018-05-03 LAB — POCT GLYCOSYLATED HEMOGLOBIN (HGB A1C): HEMOGLOBIN A1C: 7 % — AB (ref 4.0–5.6)

## 2018-05-03 MED ORDER — LIRAGLUTIDE 18 MG/3ML ~~LOC~~ SOPN: PEN_INJECTOR | SUBCUTANEOUS | 1 refills | Status: DC

## 2018-05-03 NOTE — Progress Notes (Signed)
Patient ID: Betty [name], female   DOB: [birth year], 62 y.o.   MRN: [id number]  HPI: Betty [name] is a 62 y.o. female, returning for f/u for DM2, dx >10 years ago, insulin-dependent, uncontrolled, with complications (CAD, peripheral neuropathy). Last visit 4 mo ago.  Since last visit, she developed severe nausea and vertigo on 04/10/2018. She was on Reglan (per Dr. Russella Dar) that helped with the nausea >> now off >> nausea still resolved.  However, she is still left with vertigo and she is out of work because of this.  She contacted our office and  I advised her to stop Victoza 5 days ago.  Her vertigo did not improve.  Because of the nausea, she was not able to eat, so sugars were a little better in the last 3 weeks.  Reviewed latest hemoglobin A1c: Lab Results  Component Value Date   HGBA1C 7.6 12/31/2017   HGBA1C 7.0 06/17/2017   HGBA1C 7.3 03/09/2017   Pt is on a regimen of: - Metformin XR 500 mg 2x a day -  >> stopped 04/28/2018 Breakfast  Lunch  Dinner bedtime  Insulin N: 36 units -  - 36 units  Insulin R: 16 units with a  smaller meal                 23 units wiith a  regular meal 16 units with a smaller meal 23 units with a larger meal 16 units with a smaller meal 23 units with a larger meal  -  For sugars: 71-90: take 16 units of R insulin 50-70: do not take R insulin  We tried Bydureon 2 mg weekly (added 08/2015) >> not covered Was previously on 70/30 Insulin: 70-20-30 (split in 3 b/c she was dropping her sugars overnight). On these, she was still having lows in am (40s). She was on Novolog in the past but could not afford it.  She was also on Amaryl and Avandia. She has bladder dysfxn - cannot try SGLT2 inh.  She had diarrhea with regular metformin.  Pt checks her sugars 1-3 times a day per her log - off Victoza: - am:  80-103-149, 197 >> 68, 70-155, 204 >> (79, 98 - not eating) 131-146 - 2h after b'fast:  >> 145, 215 >> n/c - before lunch:  92-143, 174 >>  84-160, 242 >> 101, 124 - 2h after lunch: 151, 199 >> n/c >> 109, 184 - before dinner:92-145, 202 >> 92-160, 192 >> 131, 178 - bedtime: 87-199, 234 >> 187 >> n/c >> 114, 160 >> 84, 122 Lowest CBG:50s >> 68 >> 79; she has hypoglycemia awareness in the 80s.  No CKD, last BUN/creatinine was:  Lab Results  Component Value Date   BUN 11 04/11/2018   Lab Results  Component Value Date   CREATININE 0.49 04/11/2018  On ramipril.  + HL; Last set of lipids: Lab Results  Component Value Date   CHOL 189 06/17/2017   HDL 37.40 (L) 06/17/2017   LDLCALC 93 01/09/2013   LDLDIRECT 109.0 06/17/2017   TRIG 302.0 (H) 06/17/2017   CHOLHDL 5 06/17/2017  On Pravachol. - eye exam: 10/2015: No DR; Dr Senaida Ores - + numbness and tingling in her legs. She was previously on Lyrica but had to stop because of somnolence.  Now on Neurontin 100 mg as needed  - she had somnolence with the 300 mg tablet . Sees Dr Yetta Barre (podiatrist).   She also has a history of HL (managed by cardiology) ; HTN;  Obesity, OSA, OA - on Osteobiflex, vit D def.  ROS: Constitutional: no weight gain/no weight loss, + fatigue, no subjective hyperthermia, no subjective hypothermia Eyes: no blurry vision, no xerophthalmia ENT: no sore throat, no nodules palpated in throat, no dysphagia, no odynophagia, + hoarseness Cardiovascular: no CP/no SOB/no palpitations/no leg swelling Respiratory: no cough/no SOB/no wheezing Gastrointestinal: + All: Nausea, vomiting, heartburn, diarrhea, constipation Musculoskeletal: + Muscle aches/+ joint aches Skin: no rashes, no hair loss Neurological: no tremors/+ numbness/+ tingling/no dizziness, + headache  I reviewed pt's medications, allergies, PMH, social hx, family hx, and changes were documented in the history of present illness. Otherwise, unchanged from my initial visit note.  Past Medical History:  Diagnosis Date  . CAD, NATIVE VESSEL   . DM (diabetes mellitus) (HCC)   . Fibromyalgia   .  HYPERLIPIDEMIA-MIXED   . HYPERTENSION, BENIGN   . Obesity   . Osteoarthritis   . Sleep apnea    Patient reported on 04/12/13   Past Surgical History:  Procedure Laterality Date  . BREATH TEK H PYLORI N/A 03/08/2013   Procedure: BREATH TEK H PYLORI;  Surgeon: Atilano InaEric M Wilson, MD;  Location: [facility];  Service: General;  Laterality: N/A;  . CESAREAN SECTION    . CHOLECYSTECTOMY  1996  . CORONARY ANGIOPLASTY WITH STENT PLACEMENT  11/2004   DES - LAD  . CORONARY STENT PLACEMENT  11/2004   Social History   Socioeconomic History  . Marital status: Divorced    Spouse name: Not on file  . Number of children: Not on file  . Years of education: Not on file  . Highest education level: Not on file  Occupational History  . Not on file  Social Needs  . Financial resource strain: Not on file  . Food insecurity:    Worry: Not on file    Inability: Not on file  . Transportation needs:    Medical: Not on file    Non-medical: Not on file  Tobacco Use  . Smoking status: Never Smoker  . Smokeless tobacco: Never Used  Substance and Sexual Activity  . Alcohol use: No  . Drug use: No  . Sexual activity: Not on file  Lifestyle  . Physical activity:    Days per week: Not on file    Minutes per session: Not on file  . Stress: Not on file  Relationships  . Social connections:    Talks on phone: Not on file    Gets together: Not on file    Attends religious service: Not on file    Active member of club or organization: Not on file    Attends meetings of clubs or organizations: Not on file    Relationship status: Not on file  . Intimate partner violence:    Fear of current or ex partner: Not on file    Emotionally abused: Not on file    Physically abused: Not on file    Forced sexual activity: Not on file  Other Topics Concern  . Not on file  Social History Narrative  . Not on file   Current Outpatient Medications on File Prior to Visit  Medication Sig Dispense Refill  .  acetaminophen (TYLENOL ARTHRITIS PAIN) 650 MG CR tablet Take 650 mg by mouth as needed for pain.    Marland Kitchen. aspirin 81 MG tablet Take 81 mg by mouth daily.      . beta carotene 1610925000 UNIT capsule Take 25,000 Units by mouth daily.      .Marland Kitchen  Calcium Carbonate-Vitamin D (CALCIUM-VITAMIN D) 600-200 MG-UNIT CAPS Take 1 tablet by mouth daily.     . cetirizine (ZYRTEC) 10 MG tablet Take 10 mg by mouth daily.    . diphenhydrAMINE (BENADRYL) 25 mg capsule Take 25 mg by mouth every 6 (six) hours as needed for itching.    . fluorometholone (FML) 0.1 % ophthalmic suspension Place 1 drop into both eyes daily. Reported on 03/06/2016    . gabapentin (NEURONTIN) 100 MG capsule TAKE 1 CAPSULE BY MOUTH EACH NIGHT AT BEDTIME 30 capsule 4  . insulin NPH Human (NOVOLIN N RELION) 100 UNIT/ML injection INJECT 36-39 UNITS SUBCUTANEOUSLY TWICE DAILY 20 mL 11  . Insulin Pen Needle (BD PEN NEEDLE NANO U/F) 32G X 4 MM MISC USE ONCE DAILY 100 each 3  . insulin regular (NOVOLIN R RELION) 100 units/mL injection INJECT 20-24 UNITS SUBCUTANEOUSLY 30 MINUTES BEFORE MEALS 100 mL 1  . Insulin Syringe-Needle U-100 (BD SAFETYGLIDE INSULIN SYRINGE) 31G X 15/64" 1 ML MISC Use 4 times a day as advised 300 each 3  . liraglutide (VICTOZA) 18 MG/3ML SOPN INJECT 1.8MG  DAILY 27 mL 1  . Magnesium 250 MG TABS Take 1 tablet by mouth daily.      . meclizine (ANTIVERT) 12.5 MG tablet Take 1 tablet (12.5 mg total) by mouth 3 (three) times daily as needed for dizziness or nausea. 30 tablet 0  . meloxicam (MOBIC) 7.5 MG tablet Take 7.5 mg by mouth daily.    . metFORMIN (GLUCOPHAGE-XR) 500 MG 24 hr tablet TAKE 1 TABLET BY MOUTH 3 TIMES DAILY WITH MEALS 180 tablet 5  . Misc Natural Products (OSTEO BI-FLEX JOINT SHIELD PO) Take 1 tablet by mouth daily.      . Omega-3 Fatty Acids (FISH OIL CONCENTRATE PO) Take 1 tablet by mouth daily.      . ondansetron (ZOFRAN) 4 MG tablet Take 4 mg by mouth every 8 (eight) hours as needed for nausea or vomiting.    .  pravastatin (PRAVACHOL) 40 MG tablet TAKE 1 TABLET BY MOUTH DAILY 90 tablet 3  . Pumpkin Seed-Soy Germ (AZO BLADDER CONTROL/GO-LESS PO) Take 2 each by mouth.    . ramipril (ALTACE) 10 MG capsule TAKE 1 CAPSULE BY MOUTH DAILY 90 capsule 1  . ULTRATRAK PRO TEST test strip TEST BLOOD SUGAR 5 TIMES DAILY AS INSTRUCTED 450 each 1   No current facility-administered medications on file prior to visit.    Allergies  Allergen Reactions  . Lyrica [Pregabalin] Other (See Comments)    sleepy   Family History  Problem Relation Age of Onset  . Cancer Unknown   . Coronary artery disease Unknown   . Diabetes Unknown   . Heart disease Mother   . Cancer Mother        ovarian  . Arthritis Mother   . Diabetes Mother   . Hypertension Mother   . Hypertension Father   . Stroke Father   . Diabetes Sister     PE: BP 122/70   Pulse 80   Ht 5\' 7"  (1.702 m)   Wt 277 lb (125.6 kg)   SpO2 96%   BMI 43.38 kg/m  Body mass index is 43.38 kg/m. Wt Readings from Last 3 Encounters:  05/03/18 277 lb (125.6 kg)  04/11/18 274 lb (124.3 kg)  12/31/17 279 lb (126.6 kg)   Constitutional: Obese, in NAD, walks with a cane Eyes: PERRLA, EOMI, no exophthalmos ENT: moist mucous membranes, no thyromegaly, no cervical lymphadenopathy Cardiovascular: RRR, No MRG  Respiratory: CTA B Gastrointestinal: abdomen soft, NT, ND, BS+ Musculoskeletal: no deformities, strength intact in all 4 Skin: moist, warm, no rashes Neurological: no tremor with outstretched hands, DTR normal in all 4  ASSESSMENT: 1. DM2, insulin-dependent, uncontrolled, with complications - CAD - s/p stent 11/2004 - Dr Eden Emms - peripheral neuropathy She told me that her previous endocrinologist filled out FMLA forms 2/2 uncontrolled DM2, but I cannot fill this since no major highs or lows or other issues.   2. PN  3. HL  4.  Vertigo  PLAN:  1. DM2 Patient with long-standing insulin-dependent type 2 diabetes, on basal-bolus insulin regimen,  metformin, and GLP-1 receptor agonist.  We are limited in her GLP-1 receptor agonist dosing due to her constipation.  Since last visit, she was in the emergency room with nausea and dizziness and she called [place] to see if this can be from Victoza.  While I doubt it, since she was on the medication for a long time, I advised her to stop this for a week while increasing the NPH and regular insulin doses. - After stopping Victoza, sugars did not increase significantly, so I advised her for now, to continue to check the sugars and only use insulin, however, in 2 weeks, if sugars start to increase, to add back a low dose of Victoza, 0.6 g daily, and increase to 1.2 mg if needed, but do not go above this dose. - I advised her to: Patient Instructions   Please continue: Metformin XR 500 mg 2x a day  Breakfast  Lunch  Dinner bedtime  Insulin N: 36 units -  - 36 units  Insulin R: 16 units with a  smaller meal                 23 units wiith a  regular meal 16 units with a smaller meal 23 units with a larger meal 16 units with a smaller meal 23 units with a larger meal  -  For sugars: 71-90: take 16 units of R insulin 50-70: do not take R insulin  If in 2 weeks sugars are higher, start Victoza 0.6 mg daily, then increase slowly to 1.2 mg daily.  Please return in 3 months with your sugar log.   - today, HbA1c is 7% (improved) - continue checking sugars at different times of the day - check 3x a day, rotating checks - advised for yearly eye exams >> she is not UTD - Return to clinic in 3 mo with sugar log   2. PN -Stable -She had somnolence with 300 mg of Neurontin, but now she is doing well on the 100 mg tablet  3. HL - Reviewed latest lipid panel from 06/2017: LDL higher than target, triglycerides high Lab Results  Component Value Date   CHOL 189 06/17/2017   HDL 37.40 (L) 06/17/2017   LDLCALC 93 01/09/2013   LDLDIRECT 109.0 06/17/2017   TRIG 302.0 (H) 06/17/2017   CHOLHDL 5 06/17/2017   - Continues pravastatin without side effects.  4.  Vertigo - She has investigation for her inner ear by ENT and if this is negative, I wonder if vertebral Doppler flow would be useful during the investigation for her vertigo.  Carlus Pavlov, MD PhD [facility]

## 2018-05-03 NOTE — Patient Instructions (Addendum)
Please continue: Metformin XR 500 mg 2x a day  Breakfast  Lunch  Dinner bedtime  Insulin N: 36 units -  - 36 units  Insulin R: 16 units with a  smaller meal                 23 units wiith a  regular meal 16 units with a smaller meal 23 units with a larger meal 16 units with a smaller meal 23 units with a larger meal  -  For sugars: 71-90: take 16 units of R insulin 50-70: do not take R insulin  If in 2 weeks sugars are higher, start Victoza 0.6 mg daily, then increase slowly to 1.2 mg daily.  Please return in 3 months with your sugar log.

## 2018-05-09 ENCOUNTER — Other Ambulatory Visit: Payer: Self-pay | Admitting: Endocrinology

## 2018-05-10 ENCOUNTER — Other Ambulatory Visit: Payer: Self-pay

## 2018-05-10 MED ORDER — INSULIN NPH (HUMAN) (ISOPHANE) 100 UNIT/ML ~~LOC~~ SUSP: SUBCUTANEOUS | 11 refills | Status: DC

## 2018-05-18 ENCOUNTER — Ambulatory Visit: Payer: Federal, State, Local not specified - PPO | Admitting: Cardiovascular Disease

## 2018-07-20 ENCOUNTER — Other Ambulatory Visit: Payer: Self-pay | Admitting: Endocrinology

## 2018-07-27 ENCOUNTER — Other Ambulatory Visit: Payer: Self-pay | Admitting: Endocrinology

## 2018-08-01 ENCOUNTER — Other Ambulatory Visit: Payer: Self-pay

## 2018-08-01 ENCOUNTER — Telehealth: Payer: Self-pay | Admitting: Internal Medicine

## 2018-08-01 MED ORDER — INSULIN REGULAR HUMAN 100 UNIT/ML IJ SOLN: INTRAMUSCULAR | 1 refills | Status: DC

## 2018-08-01 NOTE — Telephone Encounter (Signed)
NOVOLIN R RELION 100 UNIT/ML injection   Patient stated that the pharmacy will be sending over a fax for approval on patients prescription .    [facility] [address], [place] - [address]

## 2018-08-02 ENCOUNTER — Encounter

## 2018-08-02 ENCOUNTER — Ambulatory Visit: Payer: Federal, State, Local not specified - PPO | Admitting: Neurology

## 2018-08-02 ENCOUNTER — Encounter: Payer: Self-pay | Admitting: Neurology

## 2018-08-02 VITALS — BP 130/64 | HR 85 | Ht 67.0 in | Wt 271.5 lb

## 2018-08-02 DIAGNOSIS — G3281 Cerebellar ataxia in diseases classified elsewhere: Secondary | ICD-10-CM

## 2018-08-02 DIAGNOSIS — R269 Unspecified abnormalities of gait and mobility: Secondary | ICD-10-CM | POA: Diagnosis not present

## 2018-08-02 MED ORDER — CLOPIDOGREL BISULFATE 75 MG PO TABS: 75.0000 mg | ORAL_TABLET | Freq: Every day | ORAL | 11 refills | Status: DC

## 2018-08-02 NOTE — Progress Notes (Signed)
PATIENT: Betty [name] DOB: 1956/02/20  Chief Complaint  Patient presents with  . Dizziness    Orthostatic Vitals: Lying: 130/64, 85, Sitting: 141/78, 91, Standing: 150/81, 95, Standing x 3 minutes: 147/83, 95.  She is here with her daughter, Betty [name].  Her dizziness worsens with most sudden positional changes.  She has been going to vestibular rehab for the last month with little improvement.  She has already been evaluated by ENT.  Marland Kitchen PCP    Rosario Adie, Juel Burrow, FNP     HISTORICAL  Betty [name] is a 62 year old female, seen in request by her primary care nurse practitioner Caryn Bee for evaluation of dizziness, initial evaluation was on August 02, 2018.  She is accompanied by her daughter Betty [name] today's clinical visit  I have reviewed and summarized the referring note from the referring physician.  She has past medical history of diabetes, insulin-dependent, hypertension, hyperlipidemia, coronary artery disease, she also has obesity, obstructive sleep apnea  On April 10 2018, she woke up from sleep with sudden onset vertigo,throwup, unsteady gait, symptoms consistent since his onset, she presented to the emergency room on April 12, 2018, I personally reviewed CT head without contrast that was normal.  CT abdomen showed hepatic stenosis, aortic atherosclerosis, no acute abnormality.  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  ALLERGIES: Allergies  Allergen Reactions  . Lyrica [Pregabalin] Other (See Comments)    sleepy    HOME MEDICATIONS: Current Outpatient Medications  Medication Sig Dispense Refill  . acetaminophen (TYLENOL ARTHRITIS PAIN) 650 MG CR tablet Take 650 mg by mouth as needed for pain.    Marland Kitchen aspirin 81 MG tablet Take 81 mg by mouth daily.      . beta carotene 16109 UNIT capsule Take 25,000 Units by mouth daily.      . Calcium Carbonate-Vitamin D (CALCIUM-VITAMIN D) 600-200 MG-UNIT CAPS Take 1 tablet by mouth daily.     . cetirizine (ZYRTEC) 10 MG tablet Take 10 mg by mouth daily.    . diphenhydrAMINE (BENADRYL) 25 mg capsule Take 25 mg by mouth every 6 (six) hours as needed for itching.    . fluorometholone (FML) 0.1 % ophthalmic suspension Place 1 drop into both eyes daily. Reported on 03/06/2016    . gabapentin (NEURONTIN) 100 MG capsule TAKE 1 CAPSULE BY MOUTH EACH NIGHT AT BEDTIME 30 capsule 4  . insulin NPH Human (NOVOLIN N RELION) 100 UNIT/ML injection INJECT 36-39 UNITS SUBCUTANEOUSLY TWICE DAILY 20 mL 11  . Insulin Pen Needle (BD PEN NEEDLE NANO U/F) 32G X 4 MM MISC USE ONCE DAILY 100 each 3  . insulin regular (NOVOLIN R RELION) 100 units/mL injection INJECT 20-24 UNITS SUBCUTANEOUSLY 30 MINUTES BEFORE MEALS 100 mL 1  . Insulin Syringe-Needle U-100 (BD SAFETYGLIDE INSULIN SYRINGE) 31G X 15/64" 1 ML MISC Use 4 times a day as advised 300 each 3  . liraglutide (VICTOZA) 18 MG/3ML SOPN INJECT 1.2 MG  DAILY 27 mL 1  . Magnesium 250 MG TABS Take 1 tablet by mouth daily.      . meclizine (ANTIVERT) 12.5 MG tablet Take 1 tablet (12.5 mg total) by mouth 3 (three) times daily as needed for dizziness or nausea. 30 tablet 0  . meloxicam (MOBIC) 7.5 MG tablet Take 7.5 mg by mouth daily.    . metFORMIN (GLUCOPHAGE-XR) 500 MG 24 hr tablet TAKE 1 TABLET BY MOUTH 3 TIMES DAILY WITH MEALS 180 tablet 5  . Misc Natural Products (OSTEO BI-FLEX JOINT SHIELD PO) Take 1 tablet by mouth  daily.      Marland Kitchen NOVOLIN R RELION 100 UNIT/ML injection INJECT 20 TO 24 UNITS SUBCUTANEOUSLY 30 MINUTES BEFORE MEALS 2 vial 1  . Omega-3 Fatty Acids (FISH OIL CONCENTRATE PO) Take 1 tablet by mouth daily.      . ondansetron (ZOFRAN) 4 MG tablet Take 4 mg by mouth every 8 (eight) hours as needed for nausea or vomiting.    . pravastatin (PRAVACHOL) 40 MG tablet TAKE 1 TABLET BY MOUTH DAILY 90 tablet 3  . Pumpkin Seed-Soy Germ (AZO BLADDER CONTROL/GO-LESS PO) Take 2 each by mouth.    . ramipril (ALTACE) 10 MG capsule TAKE 1 CAPSULE BY MOUTH DAILY 90 capsule 1  . ULTRATRAK PRO TEST test strip TEST BLOOD SUGAR 5 TIMES DAILY AS INSTRUCTED 450 each 1   No current facility-administered medications for this visit.     PAST MEDICAL HISTORY: Past Medical History:  Diagnosis Date  . Arthritis   . CAD, NATIVE VESSEL   . DM (diabetes mellitus) (HCC)   . Fibromyalgia   . HYPERLIPIDEMIA-MIXED   . HYPERTENSION, BENIGN   . Neuropathy   . Obesity   . Osteoarthritis   . Sleep apnea    Patient reported on 04/12/13  . Vertigo     PAST SURGICAL HISTORY: Past Surgical History:  Procedure Laterality Date  . BREATH TEK H PYLORI N/A 03/08/2013   Procedure: BREATH TEK H PYLORI;  Surgeon: Atilano Ina, MD;  Location: [facility];  Service: General;  Laterality: N/A;  . CESAREAN SECTION    . CHOLECYSTECTOMY  1996  . CORONARY ANGIOPLASTY WITH STENT PLACEMENT  11/2004   DES - LAD  . CORONARY STENT PLACEMENT  11/2004    FAMILY HISTORY: Family History  Problem Relation Age of Onset  . Heart disease Mother   . Cancer Mother        ovarian  . Arthritis Mother   . Diabetes Mother   . Hypertension Mother   . Hypertension Father   . Stroke Father   . Diabetes Sister   . Cancer Unknown   . Coronary artery disease Unknown   . Diabetes Unknown     SOCIAL HISTORY: Social History   Socioeconomic History  . Marital status: Divorced    Spouse name: Not on file  . Number of children: 2  . Years of  education: 47  . Highest education level: High school graduate  Occupational History  . 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 Financial resource strain: Not on file  . Food insecurity:    Worry: Not on file    Inability: Not on file  . Transportation needs:    Medical: Not on file    Non-medical: Not on file  Tobacco Use  . Smoking status: Never Smoker  . Smokeless tobacco: Never Used  Substance and Sexual Activity  . Alcohol use: No  . Drug use:  No  . Sexual activity: Not on file  Lifestyle  . Physical activity:    Days per week: Not on file    Minutes per session: Not on file  . Stress: Not on file  Relationships  . Social connections:    Talks on phone: Not on file    Gets together: Not on file    Attends religious service: Not on file    Active member of club or organization: Not on file    Attends meetings of clubs or organizations: Not on file    Relationship status: Not on file  . Intimate partner violence:    Fear of current or ex partner: Not on file    Emotionally abused: Not on file    Physically abused: Not on file    Forced sexual activity: Not on file  Other Topics Concern  . Not on file  Social History Narrative   Lives alone.   Right-handed.   Caffeine use:  She will sometimes drink up to three 20-oz bottles of soda per day.  Drinks 0.5 bottles of a 5-hour energy drink daily.     PHYSICAL EXAM   Vitals:   08/02/18 1433  BP: 130/64  Pulse: 85  Weight: 271 lb 8 oz (123.2 kg)  Height: 5\' 7"  (1.702 m)    Not recorded      Body mass index is 42.52 kg/m.  PHYSICAL EXAMNIATION:  Gen: NAD, conversant, well nourised, obese, well groomed                     Cardiovascular: Regular rate rhythm, no peripheral edema, warm, nontender. Eyes: Conjunctivae clear without exudates or hemorrhage Neck: Supple, no carotid bruits. Pulmonary: Clear to auscultation bilaterally   NEUROLOGICAL EXAM:  MENTAL STATUS: Deconditioned middle-aged  female Speech:    Speech is normal; fluent and spontaneous with normal comprehension.  Cognition:     Orientation to time, place and person     Normal recent and remote memory     Normal Attention span and concentration     Normal Language, naming, repeating,spontaneous speech     Fund of knowledge   CRANIAL NERVES: CN II: Visual fields are full to confrontation. Fundoscopic exam is normal with sharp discs and no vascular changes. Pupils are round equal and briskly reactive to light. CN III, IV, VI: extraocular movement are normal. No ptosis. CN V: Facial sensation is intact to pinprick in all 3 divisions bilaterally. Corneal responses are intact.  CN VII: Face is symmetric with normal eye closure and smile. CN VIII: Hearing is normal to rubbing fingers CN IX, X: Palate elevates symmetrically. Phonation is normal. CN XI: Head turning and shoulder shrug are intact CN XII: Tongue is midline with normal movements and no atrophy.  MOTOR: There is no pronator drift of out-stretched arms. Muscle bulk and tone are normal. Muscle strength is normal.  REFLEXES: Reflexes are 2+ and symmetric at the biceps, triceps, knees, and ankles. Plantar responses are flexor.  SENSORY: Intact to light touch, pinprick, positional sensation and vibratory sensation are intact in fingers and toes.  COORDINATION: Rapid alternating movements and fine finger movements are intact. There is no dysmetria on finger-to-nose and heel-knee-shin.    GAIT/STANCE: Need to push off chair to get up from seated position, wide-based, cautious, unsteady gait Romberg is absent.   DIAGNOSTIC DATA (LABS, IMAGING, TESTING) - I reviewed patient records, labs, notes, testing and imaging myself where available.   ASSESSMENT AND PLAN  Betty [name] is a 62 y.o. female   Acute onset of vertigo, nausea, unsteady gait on April 10, 2018  Most suggestive of brainstem/cerebellum stroke  She has multiple vascular risk  factor of hypertension, hyperlipidemia, diabetes, obesity, obstructive sleep apnea, deconditioning.  Complete evaluation with MRI of brain  She was on aspirin 81 mg will switch to plavix 75mg  daily  Levert Feinstein, M.D. Ph.D.  [facility] [address], [address] [place], [place] [address] Ph: [phone] Fax: [phone]  ON:[id number], Juel Burrow, FNP

## 2018-08-03 ENCOUNTER — Telehealth: Payer: Self-pay | Admitting: Neurology

## 2018-08-03 NOTE — Telephone Encounter (Signed)
MR Brain wo contrast Dr. Mertie Clause Fed Auth: NPR Ref # [id number]. Patient is scheduled at [facility] for 08/10/18.

## 2018-08-03 NOTE — Telephone Encounter (Signed)
error 

## 2018-08-06 DIAGNOSIS — G3281 Cerebellar ataxia in diseases classified elsewhere: Secondary | ICD-10-CM | POA: Insufficient documentation

## 2018-08-10 ENCOUNTER — Ambulatory Visit: Payer: Federal, State, Local not specified - PPO

## 2018-08-10 DIAGNOSIS — G3281 Cerebellar ataxia in diseases classified elsewhere: Secondary | ICD-10-CM

## 2018-08-12 ENCOUNTER — Telehealth: Payer: Self-pay | Admitting: *Deleted

## 2018-08-12 NOTE — Telephone Encounter (Signed)
Left patient a detailed message, with results, on voicemail (ok per DPR).  Provided our number to call back with any questions.  

## 2018-08-12 NOTE — Telephone Encounter (Signed)
-----   Message from Levert Feinstein, MD sent at 08/11/2018  4:59 PM EDT ----- Please call pt for normal MRI brain.

## 2018-08-17 NOTE — Progress Notes (Signed)
Cardiology Office Note   Date:  08/24/2018   ID:  Betty [name], DOB 25-Jul-1956, MRN [id number]  PCP:  Maryagnes Amos, FNP  Cardiologist:   Charlton Haws, MD   No chief complaint on file.     History of Present Illness: Betty [name] is a 62 y.o. female who presents for consultation regarding CAD. Has not been seen at [facility] over 3 years. Previously seen by Dr Teressa Lower. Had Stenting of LAD in 2006 Last myovue 2015 non ischemic and low risk. Anginal equivalent Tends to be dyspnea. CRF;s HTN, DM , HLD Has OSA. Intolerant to many statins has tolerated pravastatin in past. Originally from [place] Recently seen by neuro for dizziness Vertiginous symptoms CT/MRI  head normal 04/11/18 ? Brainstem cerebellum stroke despite negative MRI started on plavix.  Symptoms seem to be vertiginous. She has a constant sense of imbalance as well as neuropathy In LE''s. No cardiac symptoms Not clear to me that she needs to be on plavix since MRI negative With no evidence of posterior circulation stroke.  She indicates needing a carotid duplex Unfortunatley Did not have MRA at the time of her MRI    Past Medical History:  Diagnosis Date  . Arthritis   . CAD, NATIVE VESSEL   . DM (diabetes mellitus) (HCC)   . Fibromyalgia   . HYPERLIPIDEMIA-MIXED   . HYPERTENSION, BENIGN   . Neuropathy   . Obesity   . Osteoarthritis   . Sleep apnea    Patient reported on 04/12/13  . Vertigo     Past Surgical History:  Procedure Laterality Date  . BREATH TEK H PYLORI N/A 03/08/2013   Procedure: BREATH TEK H PYLORI;  Surgeon: Atilano Ina, MD;  Location: [facility];  Service: General;  Laterality: N/A;  . CESAREAN SECTION    . CHOLECYSTECTOMY  1996  . CORONARY ANGIOPLASTY WITH STENT PLACEMENT  11/2004   DES - LAD  . CORONARY STENT PLACEMENT  11/2004     Current Outpatient Medications  Medication Sig Dispense Refill  . acetaminophen (TYLENOL ARTHRITIS PAIN) 650 MG CR  tablet Take 650 mg by mouth as needed for pain.    . beta carotene 93790 UNIT capsule Take 25,000 Units by mouth daily.      . Calcium Carbonate-Vitamin D (CALCIUM-VITAMIN D) 600-200 MG-UNIT CAPS Take 1 tablet by mouth daily.     . cetirizine (ZYRTEC) 10 MG tablet Take 10 mg by mouth daily.    . clopidogrel (PLAVIX) 75 MG tablet Take 1 tablet (75 mg total) by mouth daily. 30 tablet 11  . diphenhydrAMINE (BENADRYL) 25 mg capsule Take 25 mg by mouth every 6 (six) hours as needed for itching.    . fluorometholone (FML) 0.1 % ophthalmic suspension Place 1 drop into both eyes daily. Reported on 03/06/2016    . gabapentin (NEURONTIN) 100 MG capsule TAKE 1 CAPSULE BY MOUTH EACH NIGHT AT BEDTIME 30 capsule 4  . insulin NPH Human (NOVOLIN N RELION) 100 UNIT/ML injection INJECT up to 100 units daily under skin 30 mL 11  . Insulin Pen Needle (BD PEN NEEDLE NANO U/F) 32G X 4 MM MISC USE ONCE DAILY 100 each 3  . insulin regular (NOVOLIN R RELION) 100 units/mL injection Use up to 100 units a day 3 vial 11  . Insulin Syringe-Needle U-100 (BD SAFETYGLIDE INSULIN SYRINGE) 31G X 15/64" 1 ML MISC Use 4 times a day as advised 300 each 3  . liraglutide (VICTOZA) 18 MG/3ML  SOPN INJECT 1.2 MG DAILY 27 mL 1  . Magnesium 250 MG TABS Take 1 tablet by mouth daily.      . meclizine (ANTIVERT) 12.5 MG tablet Take 1 tablet (12.5 mg total) by mouth 3 (three) times daily as needed for dizziness or nausea. 30 tablet 0  . meloxicam (MOBIC) 7.5 MG tablet Take 7.5 mg by mouth daily.    . metFORMIN (GLUCOPHAGE-XR) 500 MG 24 hr tablet TAKE 1 TABLET BY MOUTH 3 TIMES DAILY WITH MEALS 180 tablet 5  . Misc Natural Products (OSTEO BI-FLEX JOINT SHIELD PO) Take 1 tablet by mouth daily.      . Omega-3 Fatty Acids (FISH OIL CONCENTRATE PO) Take 1 tablet by mouth daily.      . ondansetron (ZOFRAN) 4 MG tablet Take 4 mg by mouth every 8 (eight) hours as needed for nausea or vomiting.    . pravastatin (PRAVACHOL) 40 MG tablet TAKE 1 TABLET BY  MOUTH DAILY 90 tablet 3  . Pumpkin Seed-Soy Germ (AZO BLADDER CONTROL/GO-LESS PO) Take 2 each by mouth.    . ramipril (ALTACE) 10 MG capsule TAKE 1 CAPSULE BY MOUTH DAILY 90 capsule 1  . ULTRATRAK PRO TEST test strip TEST BLOOD SUGAR 5 TIMES DAILY AS INSTRUCTED 450 each 1   No current facility-administered medications for this visit.     Allergies:   Lyrica [pregabalin]    Social History:  The patient  reports that she has never smoked. She has never used smokeless tobacco. She reports that she does not drink alcohol or use drugs.   Family History:  The patient's family history includes Arthritis in her mother; Cancer in her mother and unknown relative; Coronary artery disease in her unknown relative; Diabetes in her mother, sister, and unknown relative; Heart disease in her mother; Hypertension in her father and mother; Stroke in her father.    ROS:  Please see the history of present illness.   Otherwise, review of systems are positive for none.   All other systems are reviewed and negative.    PHYSICAL EXAM: VS:  BP 134/72   Pulse 83   Ht 5\' 7"  (1.702 m)   Wt 271 lb 8 oz (123.2 kg)   SpO2 97%   BMI 42.52 kg/m  , BMI Body mass index is 42.52 kg/m. Affect appropriate Healthy:  appears stated age HEENT: normal Neck supple with no adenopathy JVP normal no bruits no thyromegaly Lungs clear with no wheezing and good diaphragmatic motion Heart:  S1/S2 no murmur, no rub, gallop or click PMI normal Abdomen: benighn, BS positve, no tenderness, no AAA no bruit.  No HSM or HJR Distal pulses intact with no bruits No edema Neuro non-focal Skin warm and dry No muscular weakness    EKG:  SR rate 78 normal 04/12/18   Recent Labs: 04/11/2018: ALT 42; BUN 11; Creatinine, Ser 0.49; Hemoglobin 13.7; Platelets 258; Potassium 4.3; Sodium 139    Lipid Panel    Component Value Date/Time   CHOL 189 06/17/2017 1644   TRIG 302.0 (H) 06/17/2017 1644   HDL 37.40 (L) 06/17/2017 1644    CHOLHDL 5 06/17/2017 1644   VLDL 60.4 (H) 06/17/2017 1644   LDLCALC 93 01/09/2013 0936   LDLDIRECT 109.0 06/17/2017 1644      Wt Readings from Last 3 Encounters:  08/24/18 271 lb 8 oz (123.2 kg)  08/22/18 271 lb (122.9 kg)  08/02/18 271 lb 8 oz (123.2 kg)      Other studies Reviewed: Additional  studies/ records that were reviewed today include: Notes from [facility] old cardiology notes cath 2006 myovue 2015 Notes from primary , Dr Dan Europe Endocrine Dr .Terrace Arabia neurology Head CT/MRI labs     ASSESSMENT AND PLAN:  1. CAD:  Distant stenting LAD in 2006 with normal myovue 2015 no chest pain observe  2. Dizziness:  Has been seen by ENT/Neuro clinical vertigo CT/MRI head normal sounds Like inner ear or neurologic issues will order carotid duplex f/u neurology Monday  3. DM:  Discussed low carb diet.  Target hemoglobin A1c is 6.5 or less.  Continue current medications. 4. HLD:  Continue statin   5. HTN: Well controlled.  Continue current medications and low sodium Dash type diet.       Current medicines are reviewed at length with the patient today.  The patient does not have concerns regarding medicines.  The following changes have been made:  no change  Labs/ tests ordered today include: carotid duplex  No orders of the defined types were placed in this encounter.    Disposition:   FU with cardiology in a year      Signed, Charlton Haws, MD  08/24/2018 4:40 PM    [facility] [address] [place], [place], [place]  [address] Phone: [phone]; Fax: [phone]

## 2018-08-22 ENCOUNTER — Ambulatory Visit: Payer: Federal, State, Local not specified - PPO | Admitting: Internal Medicine

## 2018-08-22 ENCOUNTER — Encounter: Payer: Self-pay | Admitting: Internal Medicine

## 2018-08-22 VITALS — BP 122/70 | HR 84 | Ht 67.0 in | Wt 271.0 lb

## 2018-08-22 DIAGNOSIS — E118 Type 2 diabetes mellitus with unspecified complications: Secondary | ICD-10-CM | POA: Diagnosis not present

## 2018-08-22 DIAGNOSIS — E1142 Type 2 diabetes mellitus with diabetic polyneuropathy: Secondary | ICD-10-CM | POA: Diagnosis not present

## 2018-08-22 DIAGNOSIS — Z23 Encounter for immunization: Secondary | ICD-10-CM

## 2018-08-22 DIAGNOSIS — E1165 Type 2 diabetes mellitus with hyperglycemia: Secondary | ICD-10-CM | POA: Diagnosis not present

## 2018-08-22 DIAGNOSIS — Z794 Long term (current) use of insulin: Secondary | ICD-10-CM

## 2018-08-22 DIAGNOSIS — E785 Hyperlipidemia, unspecified: Secondary | ICD-10-CM | POA: Diagnosis not present

## 2018-08-22 DIAGNOSIS — IMO0002 Reserved for concepts with insufficient information to code with codable children: Secondary | ICD-10-CM

## 2018-08-22 LAB — POCT GLYCOSYLATED HEMOGLOBIN (HGB A1C): Hemoglobin A1C: 7.3 % — AB (ref 4.0–5.6)

## 2018-08-22 MED ORDER — INSULIN REGULAR HUMAN 100 UNIT/ML IJ SOLN: INTRAMUSCULAR | 11 refills | Status: DC

## 2018-08-22 MED ORDER — INSULIN NPH (HUMAN) (ISOPHANE) 100 UNIT/ML ~~LOC~~ SUSP: SUBCUTANEOUS | 11 refills | Status: DC

## 2018-08-22 NOTE — Patient Instructions (Addendum)
Please continue: - Metformin XR 500 mg 2x a day - Victoza 1.2 mg daily  Breakfast  Lunch  Dinner bedtime  Insulin N: 36 units -  - 36 units  Insulin R: 16 units with a  smaller meal                 23 units wiith a  regular meal 16 units with a smaller meal 23 units with a larger meal 16 units with a smaller meal 23 units with a larger meal  -  For sugars: 71-90: take 16 units of R insulin 50-70: do not take R insulin  Please return in 4 months with your sugar log.

## 2018-08-22 NOTE — Addendum Note (Signed)
Addended by: Darliss Ridgel I on: 08/22/2018 03:42 PM   Modules accepted: Orders

## 2018-08-22 NOTE — Progress Notes (Signed)
Patient ID: Betty [name], female   DOB: 1956/05/14, 62 y.o.   MRN: [id number]  HPI: Betty [name] is a 62 y.o. female, returning for f/u for DM2, dx >10 years ago, insulin-dependent, uncontrolled, with complications (CAD, peripheral neuropathy). Last visit 4 mo ago.  She is here with her daughter who offers part of the history especially related to her blood sugars and medical history since last visit.  She has dizziness/vertigo >> now sees neurology. MRI was normal. She is in vestibular rehab.  She was off Victoza for 1 month and also ran out of insulin for 1 week.  She is now back on both, with improvement of the sugars, but still an occasional hyperglycemic spike.  Reviewed latest hemoglobin A1c: Lab Results  Component Value Date   HGBA1C 7.0 (A) 05/03/2018   HGBA1C 7.6 12/31/2017   HGBA1C 7.0 06/17/2017   Pt is on a regimen of: - Metformin XR 500 mg 2x a day - Victoza 1.2 mg daily  Breakfast  Lunch  Dinner bedtime  Insulin N: 36 units -  - 36 units  Insulin R: 16 units with a  smaller meal                 23 units wiith a  regular meal 16 units with a smaller meal 23 units with a larger meal 16 units with a smaller meal 23 units with a larger meal  -  For sugars: 71-90: take 16 units of R insulin 50-70: do not take R insulin  We tried Bydureon 2 mg weekly (added 08/2015) >> not covered Was previously on 70/30 Insulin: 70-20-30 (split in 3 b/c she was dropping her sugars overnight). On these, she was still having lows in am (40s). She was on Novolog in the past but could not afford it.  She was also on Amaryl and Avandia. She has bladder dysfxn - cannot try SGLT2 inh.  She had diarrhea with regular metformin.  Pt checks her sugars 1-3 times a day- forgot log at home: - am:68, 70-155, 204 >> (79, 98 - not eating) 131-146 >> 111-130s, 200 - 2h after b'fast:  >> 145, 215 >> n/c - before lunch:  84-160, 242 >> 101, 124 >> 110-140 - 2h after lunch: 151, 199 >>  n/c >> 109, 184 >> n/c - before dinner:92-160, 192 >> 131, 178 >> 90-120s, 200 - bedtime: n/c >> 114, 160 >> 84, 122 >> n/c Lowest CBG:50s >> 68 >> 79 >> 60; she has hypoglycemia awareness in the 80s.  No CKD, last BUN/creatinine was:  Lab Results  Component Value Date   BUN 11 04/11/2018   Lab Results  Component Value Date   CREATININE 0.49 04/11/2018  On ramipril.  + HL; Last set of lipids: Lab Results  Component Value Date   CHOL 189 06/17/2017   HDL 37.40 (L) 06/17/2017   LDLCALC 93 01/09/2013   LDLDIRECT 109.0 06/17/2017   TRIG 302.0 (H) 06/17/2017   CHOLHDL 5 06/17/2017  On Pravachol. - eye exam: 08/2018: NO DR; + glaucoma, + cataracts.Dr Senaida Ores -+ numbness and tingling in her legs. She was previously on Lyrica but had to stop because of somnolence.  Now on Neurontin 100 mg as needed  -she had somnolence with a 300 mg tablets. Sees Dr Yetta Barre (podiatrist).   She also has a history of HL (managed by cardiology); HTN; OSA, obesity OA - on Osteobiflex, vit D def.  Before last visit, she developed severe nausea and vertigo  on 04/10/2018. She was on Reglan (per Dr. Russella Dar) that helped with the nausea >> now off >> she was out of work because of this, now back to work.  She contacted our office and  I advised her to stop Victoza but her vertigo did not improve so we restarted Victoza after less than a month.  ROS: Constitutional: no weight gain/no weight loss, + fatigue, no subjective hyperthermia, no subjective hypothermia Eyes: no blurry vision, no xerophthalmia ENT: no sore throat, no nodules palpated in neck, no dysphagia, no odynophagia, no hoarseness Cardiovascular: no CP/no SOB/no palpitations/no leg swelling Respiratory: no cough/no SOB/no wheezing Gastrointestinal: no N/no V/+ D/no C/no acid reflux Musculoskeletal: + Muscle aches/+ joint aches Skin: no rashes, no hair loss Neurological: no tremors/no numbness/no tingling/no dizziness, + headache  I reviewed pt's  medications, allergies, PMH, social hx, family hx, and changes were documented in the history of present illness. Otherwise, unchanged from my initial visit note.  She stopped aspirin and started Plavix.  Past Medical History:  Diagnosis Date  . Arthritis   . CAD, NATIVE VESSEL   . DM (diabetes mellitus) (HCC)   . Fibromyalgia   . HYPERLIPIDEMIA-MIXED   . HYPERTENSION, BENIGN   . Neuropathy   . Obesity   . Osteoarthritis   . Sleep apnea    Patient reported on 04/12/13  . Vertigo    Past Surgical History:  Procedure Laterality Date  . BREATH TEK H PYLORI N/A 03/08/2013   Procedure: BREATH TEK H PYLORI;  Surgeon: Atilano Ina, MD;  Location: [facility];  Service: General;  Laterality: N/A;  . CESAREAN SECTION    . CHOLECYSTECTOMY  1996  . CORONARY ANGIOPLASTY WITH STENT PLACEMENT  11/2004   DES - LAD  . CORONARY STENT PLACEMENT  11/2004   Social History   Socioeconomic History  . Marital status: Divorced    Spouse name: Not on file  . Number of children: 2  . Years of education: 8  . Highest education level: High school graduate  Occupational History  . 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 Financial resource strain: Not on file  . Food insecurity:    Worry: Not on file    Inability: Not on file  . Transportation needs:    Medical: Not on file    Non-medical: Not on file  Tobacco Use  . Smoking status: Never Smoker  . Smokeless tobacco: Never Used  Substance and Sexual Activity  . Alcohol use: No  . Drug use: No  . Sexual activity: Not on file  Lifestyle  . Physical activity:    Days per week: Not on file    Minutes per session: Not on file  . Stress: Not on file  Relationships  . Social connections:    Talks on phone: Not on file    Gets together: Not on file    Attends religious service: Not on file    Active member of club or organization: Not on file    Attends meetings of clubs or organizations: Not on file    Relationship status: Not on  file  . Intimate partner violence:    Fear of current or ex partner: Not on file    Emotionally abused: Not on file    Physically abused: Not on file    Forced sexual activity: Not on file  Other Topics Concern  . Not on file  Social History Narrative   Lives alone.   Right-handed.  Caffeine use:  She will sometimes drink up to three 20-oz bottles of soda per day.  Drinks 0.5 bottles of a 5-hour energy drink daily.   Current Outpatient Medications on File Prior to Visit  Medication Sig Dispense Refill  . acetaminophen (TYLENOL ARTHRITIS PAIN) 650 MG CR tablet Take 650 mg by mouth as needed for pain.    . beta carotene 60454 UNIT capsule Take 25,000 Units by mouth daily.      . Calcium Carbonate-Vitamin D (CALCIUM-VITAMIN D) 600-200 MG-UNIT CAPS Take 1 tablet by mouth daily.     . cetirizine (ZYRTEC) 10 MG tablet Take 10 mg by mouth daily.    . clopidogrel (PLAVIX) 75 MG tablet Take 1 tablet (75 mg total) by mouth daily. 30 tablet 11  . diphenhydrAMINE (BENADRYL) 25 mg capsule Take 25 mg by mouth every 6 (six) hours as needed for itching.    . fluorometholone (FML) 0.1 % ophthalmic suspension Place 1 drop into both eyes daily. Reported on 03/06/2016    . gabapentin (NEURONTIN) 100 MG capsule TAKE 1 CAPSULE BY MOUTH EACH NIGHT AT BEDTIME 30 capsule 4  . insulin NPH Human (NOVOLIN N RELION) 100 UNIT/ML injection INJECT 36-39 UNITS SUBCUTANEOUSLY TWICE DAILY 20 mL 11  . Insulin Pen Needle (BD PEN NEEDLE NANO U/F) 32G X 4 MM MISC USE ONCE DAILY 100 each 3  . insulin regular (NOVOLIN R RELION) 100 units/mL injection INJECT 20-24 UNITS SUBCUTANEOUSLY 30 MINUTES BEFORE MEALS 100 mL 1  . Insulin Syringe-Needle U-100 (BD SAFETYGLIDE INSULIN SYRINGE) 31G X 15/64" 1 ML MISC Use 4 times a day as advised 300 each 3  . liraglutide (VICTOZA) 18 MG/3ML SOPN INJECT 1.2 MG DAILY 27 mL 1  . Magnesium 250 MG TABS Take 1 tablet by mouth daily.      . meclizine (ANTIVERT) 12.5 MG tablet Take 1 tablet (12.5 mg  total) by mouth 3 (three) times daily as needed for dizziness or nausea. 30 tablet 0  . meloxicam (MOBIC) 7.5 MG tablet Take 7.5 mg by mouth daily.    . metFORMIN (GLUCOPHAGE-XR) 500 MG 24 hr tablet TAKE 1 TABLET BY MOUTH 3 TIMES DAILY WITH MEALS 180 tablet 5  . Misc Natural Products (OSTEO BI-FLEX JOINT SHIELD PO) Take 1 tablet by mouth daily.      Marland Kitchen NOVOLIN R RELION 100 UNIT/ML injection INJECT 20 TO 24 UNITS SUBCUTANEOUSLY 30 MINUTES BEFORE MEALS 2 vial 1  . Omega-3 Fatty Acids (FISH OIL CONCENTRATE PO) Take 1 tablet by mouth daily.      . ondansetron (ZOFRAN) 4 MG tablet Take 4 mg by mouth every 8 (eight) hours as needed for nausea or vomiting.    . pravastatin (PRAVACHOL) 40 MG tablet TAKE 1 TABLET BY MOUTH DAILY 90 tablet 3  . Pumpkin Seed-Soy Germ (AZO BLADDER CONTROL/GO-LESS PO) Take 2 each by mouth.    . ramipril (ALTACE) 10 MG capsule TAKE 1 CAPSULE BY MOUTH DAILY 90 capsule 1  . ULTRATRAK PRO TEST test strip TEST BLOOD SUGAR 5 TIMES DAILY AS INSTRUCTED 450 each 1   No current facility-administered medications on file prior to visit.    Allergies  Allergen Reactions  . Lyrica [Pregabalin] Other (See Comments)    sleepy   Family History  Problem Relation Age of Onset  . Heart disease Mother   . Cancer Mother        ovarian  . Arthritis Mother   . Diabetes Mother   . Hypertension Mother   .  Hypertension Father   . Stroke Father   . Diabetes Sister   . Cancer Unknown   . Coronary artery disease Unknown   . Diabetes Unknown     PE: BP 122/70   Pulse 84   Ht 5\' 7"  (1.702 m)   Wt 271 lb (122.9 kg)   SpO2 98%   BMI 42.44 kg/m  Body mass index is 42.44 kg/m. Wt Readings from Last 3 Encounters:  08/22/18 271 lb (122.9 kg)  08/02/18 271 lb 8 oz (123.2 kg)  05/03/18 277 lb (125.6 kg)   Constitutional: overweight, in NAD Eyes: PERRLA, EOMI, no exophthalmos ENT: moist mucous membranes, no thyromegaly, no cervical lymphadenopathy Cardiovascular: RRR, No  MRG Respiratory: CTA B Gastrointestinal: abdomen soft, NT, ND, BS+ Musculoskeletal: no deformities, strength intact in all 4 Skin: moist, warm, no rashes Neurological: no tremor with outstretched hands, DTR normal in all 4  ASSESSMENT: 1. DM2, insulin-dependent, uncontrolled, with complications - CAD - s/p stent 11/2004 - Dr Eden Emms - peripheral neuropathy She told me that her previous endocrinologist filled out FMLA forms 2/2 uncontrolled DM2, but I could not fill this since no major highs or lows or other issues.   2. PN  3. HL  PLAN:  1. DM2 Patient with long-standing, insulin-dependent, type 2 diabetes, on basal-bolus insulin regimen, metformin, and GLP-1 receptor agonist.  She has constipation so we had to back off her Victoza at last visits.  She is currently taking 1.2 mg daily.  She was also in the emergency room with nausea and vertigo and we stopped Victoza for a period of time.  Her symptoms not improved so we restarted Victoza. -She has had a hard time over the summer with dizziness and vertigo and she is now in vestibular rehab.  She ruled out for stroke by an MRI, but continues to see neurology with next appointment pending in 1 week.  She was out of work for a month and was also off Victoza for a few weeks during this period.  She also ran out of insulin but was not out for more than a week.  With all these happening, her sugars were higher but they started to improve with only hyperglycemic spikes recently.  Third their recall (forgot log), sugars are mostly at goal before meals.  Will not change her regimen for now. - I advised her to: Patient Instructions   Please continue: - Metformin XR 500 mg 2x a day - Victoza 1.2 mg daily  Breakfast  Lunch  Dinner bedtime  Insulin N: 36 units -  - 36 units  Insulin R: 16 units with a  smaller meal                 23 units wiith a  regular meal 16 units with a smaller meal 23 units with a larger meal 16 units with a smaller  meal 23 units with a larger meal  -  For sugars: 71-90: take 16 units of R insulin 50-70: do not take R insulin  Please return in 4 months with your sugar log.   - today, HbA1c is 7.3% (higher) - continue checking sugars at different times of the day - check 3x a day, rotating checks - advised for yearly eye exams >> she is UTD - Given flu shot today - Return to clinic in 4 mo with sugar log    2. PN -Stable -She had somnolence with a 300 mg tablet of Neurontin, but is doing well on  100 mg daily.  She tells me the neurology suggested to increase the dose to 300 mg daily.  She did not start this new dose yet.  3. HL - Reviewed latest lipid panel 06/2017: High triglycerides, LDL above goal, higher Lab Results  Component Value Date   CHOL 189 06/17/2017   HDL 37.40 (L) 06/17/2017   LDLCALC 93 01/09/2013   LDLDIRECT 109.0 06/17/2017   TRIG 302.0 (H) 06/17/2017   CHOLHDL 5 06/17/2017  - Continues pravastatin without side effects.  Carlus Pavlov, MD PhD [facility]

## 2018-08-24 ENCOUNTER — Encounter: Payer: Self-pay | Admitting: Cardiovascular Disease

## 2018-08-24 ENCOUNTER — Ambulatory Visit: Payer: Federal, State, Local not specified - PPO | Admitting: Cardiovascular Disease

## 2018-08-24 VITALS — BP 134/72 | HR 83 | Ht 67.0 in | Wt 271.5 lb

## 2018-08-24 DIAGNOSIS — G459 Transient cerebral ischemic attack, unspecified: Secondary | ICD-10-CM

## 2018-08-24 DIAGNOSIS — E785 Hyperlipidemia, unspecified: Secondary | ICD-10-CM

## 2018-08-24 DIAGNOSIS — I10 Essential (primary) hypertension: Secondary | ICD-10-CM

## 2018-08-24 DIAGNOSIS — R42 Dizziness and giddiness: Secondary | ICD-10-CM

## 2018-08-24 DIAGNOSIS — I2510 Atherosclerotic heart disease of native coronary artery without angina pectoris: Secondary | ICD-10-CM

## 2018-08-24 NOTE — Patient Instructions (Addendum)
Medication Instructions:   If you need a refill on your cardiac medications before your next appointment, please call your pharmacy.   Lab work:  If you have labs (blood work) drawn today and your tests are completely normal, you will receive your results only by: Marland Kitchen. MyChart Message (if you have MyChart) OR . A paper copy in the mail If you have any lab test that is abnormal or we need to change your treatment, we will call you to review the results.  Testing/Procedures: Your physician has requested that you have a carotid duplex as soon as possible. This test is an ultrasound of the carotid arteries in your neck. It looks at blood flow through these arteries that supply the brain with blood. Allow one hour for this exam. There are no restrictions or special instructions.  Follow-Up: At [facility], you and your health needs are our priority.  As part of our continuing mission to provide you with exceptional heart care, we have created designated Provider Care Teams.  These Care Teams include your primary Cardiologist (physician) and Advanced Practice Providers (APPs -  Physician Assistants and Nurse Practitioners) who all work together to provide you with the care you need, when you need it. You will need a follow up appointment in 1 years.  Please call our office 2 months in advance to schedule this appointment.  You may see Charlton HawsPeter Nishan, MD or one of the following Advanced Practice Providers on your designated Care Team:   Norma FredricksonLori Gerhardt, NP Nada BoozerLaura Ingold, NP . Georgie ChardJill McDaniel, NP

## 2018-08-25 ENCOUNTER — Ambulatory Visit (HOSPITAL_COMMUNITY)
Admission: RE | Admit: 2018-08-25 | Discharge: 2018-08-25 | Disposition: A | Discharge status: Home / Self Care | Payer: Federal, State, Local not specified - PPO | Source: Ambulatory Visit | Attending: Cardiology | Admitting: Cardiology

## 2018-08-25 DIAGNOSIS — G459 Transient cerebral ischemic attack, unspecified: Secondary | ICD-10-CM | POA: Diagnosis not present

## 2018-08-29 ENCOUNTER — Telehealth: Payer: Self-pay | Admitting: *Deleted

## 2018-08-29 ENCOUNTER — Encounter: Payer: Self-pay | Admitting: Neurology

## 2018-08-29 ENCOUNTER — Ambulatory Visit: Payer: Self-pay | Admitting: Neurology

## 2018-08-29 NOTE — Telephone Encounter (Signed)
No showed follow up appointment. 

## 2018-09-19 ENCOUNTER — Telehealth: Payer: Self-pay | Admitting: Neurology

## 2018-09-19 NOTE — Telephone Encounter (Signed)
Pt requesting a call stating she would like to discuss coming off medication clopidogrel (PLAVIX) 75 MG tablet please advise.

## 2018-09-20 NOTE — Telephone Encounter (Signed)
Late entry from 09/19/2018:  Rn spoke with Dr .Terrace ArabiaYan about pt wanting to come off plavix. Dr. Terrace ArabiaYan advise nurse that due to htn, coronary artery disease, and hld, and dm she is at risk for stroke. Pt needs to be on plavix to reduce the risk. Pt needs to continue taking the medication.Rn will call pt with this recommendation.

## 2018-09-20 NOTE — Telephone Encounter (Signed)
Rn call patient that Dr.Yan recommends she stays on the plavix because of risk for stroke. Pt verbalized understanding.

## 2018-09-22 ENCOUNTER — Other Ambulatory Visit: Payer: Self-pay | Admitting: Internal Medicine

## 2018-10-10 ENCOUNTER — Other Ambulatory Visit: Payer: Self-pay

## 2018-10-10 MED ORDER — ULTRATRAK PRO TEST VI STRP: ORAL_STRIP | 1 refills | Status: AC

## 2018-12-02 ENCOUNTER — Other Ambulatory Visit: Payer: Self-pay | Admitting: Internal Medicine

## 2018-12-12 ENCOUNTER — Ambulatory Visit (INDEPENDENT_AMBULATORY_CARE_PROVIDER_SITE_OTHER): Payer: Federal, State, Local not specified - PPO | Admitting: Neurology

## 2018-12-12 ENCOUNTER — Encounter: Payer: Self-pay | Admitting: Neurology

## 2018-12-12 VITALS — BP 166/80 | HR 82 | Resp 18 | Ht 67.0 in | Wt 272.0 lb

## 2018-12-12 DIAGNOSIS — R269 Unspecified abnormalities of gait and mobility: Secondary | ICD-10-CM | POA: Diagnosis not present

## 2018-12-12 DIAGNOSIS — E1142 Type 2 diabetes mellitus with diabetic polyneuropathy: Secondary | ICD-10-CM

## 2018-12-12 NOTE — Progress Notes (Signed)
PATIENT: Betty [name] DOB: 09-03-1956  Chief Complaint  Patient presents with  . Gait Disturbance    Rm. 4.  Here to discuss MRI results. Sts. still feels off balance. Last fall was 10/14/18 (left leg became numb with prolonged sitting, and when she stood, she fell)/fim  . Dizziness     HISTORICAL  Betty [name] is a 63 year old female, seen in request by her primary care nurse practitioner Caryn Bee for evaluation of dizziness, initial evaluation was on August 02, 2018.  She is accompanied by her daughter Jake Samples today's clinical visit  I have reviewed and summarized the referring note from the referring physician.  She has past medical history of diabetes, insulin-dependent, hypertension, hyperlipidemia, coronary artery disease, she also has obesity, obstructive sleep apnea  On April 10 2018, she woke up from sleep with sudden onset vertigo,throwup, unsteady gait, symptoms consistent since its onset, she presented to the emergency room on April 12, 2018, I personally reviewed CT head without contrast that was normal.  CT abdomen showed hepatic stenosis, aortic atherosclerosis, no acute abnormality.  Laboratory evaluation showed A1c of 7.0, negative troponin, elevated glucose 235, otherwise no significant abnormality on BMP CBC, she was given prescription of Reglan, her symptoms over the past few months gradually improved, but still not back to baseline, she has to take 1 month off,  She is a Librarian, academic for attorney, she was finally able to go back to work on May 12, 2018, complains of excessive fatigue, using cane occasionally, intermittent unsteady gait,  UPDATE December 12 2018: She has much improved, almost back to her baseline, she still feel off balance episodes when getting up suddenly.   We personally reviewed MRI of the brain without contrast on August 10, 2018: Was normal  Patient is severely deconditioned, diabetes, diabetic peripheral neuropathy,    REVIEW OF SYSTEMS: Full 14 system review of systems performed and notable only for fatigue, constipation, diarrhea, leg swelling, incontinence of bladder, frequent urinations, bruise easily, joint pain, swelling, muscle cramps, neck pain, stiffness  All other review of systems were negative.  ALLERGIES: Allergies  Allergen Reactions  . Lyrica [Pregabalin] Other (See Comments)    sleepy    HOME MEDICATIONS: Current Outpatient Medications  Medication Sig Dispense Refill  . acetaminophen (TYLENOL ARTHRITIS PAIN) 650 MG CR tablet Take 650 mg by mouth as needed for pain.    . beta carotene 28315 UNIT capsule Take 25,000 Units by mouth daily.      . Calcium Carbonate-Vitamin D (CALCIUM-VITAMIN D) 600-200 MG-UNIT CAPS Take 1 tablet by mouth daily.     . cetirizine (ZYRTEC) 10 MG tablet Take 10 mg by mouth daily.    . clopidogrel (PLAVIX) 75 MG tablet Take 1 tablet (75 mg total) by mouth daily. 30 tablet 11  . diphenhydrAMINE (BENADRYL) 25 mg capsule Take 25 mg by mouth every 6 (six) hours as needed for itching.    . fluorometholone (FML) 0.1 % ophthalmic suspension Place 1 drop into both eyes daily. Reported on 03/06/2016    . gabapentin (NEURONTIN) 100 MG capsule TAKE 1 CAPSULE BY MOUTH EACH NIGHT AT BEDTIME 30 capsule 4  . insulin NPH Human (NOVOLIN N RELION) 100 UNIT/ML injection INJECT up to 100 units daily under skin 30 mL 11  . Insulin Pen Needle (BD PEN NEEDLE NANO U/F) 32G X 4 MM MISC USE ONCE DAILY 100 each 3  . insulin regular (NOVOLIN R RELION) 100 units/mL injection Use up to 100 units  a day 3 vial 11  . Insulin Syringe-Needle U-100 (BD SAFETYGLIDE INSULIN SYRINGE) 31G X 15/64" 1 ML MISC Use 4 times a day as advised 300 each 3  . liraglutide (VICTOZA) 18 MG/3ML SOPN INJECT 1.8MG  DAILY 27 mL 1  . Magnesium 250 MG TABS Take 1 tablet by mouth daily.      . meclizine (ANTIVERT) 12.5 MG tablet Take 1 tablet (12.5 mg total) by mouth 3 (three) times daily as needed for dizziness or  nausea. 30 tablet 0  . meloxicam (MOBIC) 7.5 MG tablet Take 7.5 mg by mouth daily.    . metFORMIN (GLUCOPHAGE-XR) 500 MG 24 hr tablet TAKE 1 TABLET BY MOUTH THREE TIMES DAILYWITH MEALS 180 tablet 5  . Misc Natural Products (OSTEO BI-FLEX JOINT SHIELD PO) Take 1 tablet by mouth daily.      . Omega-3 Fatty Acids (FISH OIL CONCENTRATE PO) Take 1 tablet by mouth daily.      . ondansetron (ZOFRAN) 4 MG tablet Take 4 mg by mouth every 8 (eight) hours as needed for nausea or vomiting.    . pravastatin (PRAVACHOL) 40 MG tablet TAKE 1 TABLET BY MOUTH DAILY 90 tablet 3  . Pumpkin Seed-Soy Germ (AZO BLADDER CONTROL/GO-LESS PO) Take 2 each by mouth.    . ramipril (ALTACE) 10 MG capsule TAKE 1 CAPSULE BY MOUTH DAILY 90 capsule 1  . ULTRATRAK PRO TEST test strip TEST BLOOD SUGAR 5 TIMES DAILY AS INSTRUCTED 450 each 1   No current facility-administered medications for this visit.     PAST MEDICAL HISTORY: Past Medical History:  Diagnosis Date  . Arthritis   . CAD, NATIVE VESSEL   . DM (diabetes mellitus) (HCC)   . Fibromyalgia   . HYPERLIPIDEMIA-MIXED   . HYPERTENSION, BENIGN   . Neuropathy   . Obesity   . Osteoarthritis   . Sleep apnea    Patient reported on 04/12/13  . Vertigo     PAST SURGICAL HISTORY: Past Surgical History:  Procedure Laterality Date  . BREATH TEK H PYLORI N/A 03/08/2013   Procedure: BREATH TEK H PYLORI;  Surgeon: Atilano Ina, MD;  Location: [facility];  Service: General;  Laterality: N/A;  . CESAREAN SECTION    . CHOLECYSTECTOMY  1996  . CORONARY ANGIOPLASTY WITH STENT PLACEMENT  11/2004   DES - LAD  . CORONARY STENT PLACEMENT  11/2004    FAMILY HISTORY: Family History  Problem Relation Age of Onset  . Heart disease Mother   . Cancer Mother        ovarian  . Arthritis Mother   . Diabetes Mother   . Hypertension Mother   . Hypertension Father   . Stroke Father   . Diabetes Sister   . Cancer Unknown   . Coronary artery disease Unknown   . Diabetes Unknown      SOCIAL HISTORY: Social History   Socioeconomic History  . Marital status: Divorced    Spouse name: Not on file  . Number of children: 2  . Years of education: 66  . Highest education level: High school graduate  Occupational History  . 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 Financial resource strain: Not on file  . Food insecurity:    Worry: Not on file    Inability: Not on file  . Transportation needs:    Medical: Not on file    Non-medical: Not on file  Tobacco Use  . Smoking status: Never Smoker  . Smokeless  tobacco: Never Used  Substance and Sexual Activity  . Alcohol use: No  . Drug use: No  . Sexual activity: Not on file  Lifestyle  . Physical activity:    Days per week: Not on file    Minutes per session: Not on file  . Stress: Not on file  Relationships  . Social connections:    Talks on phone: Not on file    Gets together: Not on file    Attends religious service: Not on file    Active member of club or organization: Not on file    Attends meetings of clubs or organizations: Not on file    Relationship status: Not on file  . Intimate partner violence:    Fear of current or ex partner: Not on file    Emotionally abused: Not on file    Physically abused: Not on file    Forced sexual activity: Not on file  Other Topics Concern  . Not on file  Social History Narrative   Lives alone.   Right-handed.   Caffeine use:  She will sometimes drink up to three 20-oz bottles of soda per day.  Drinks 0.5 bottles of a 5-hour energy drink daily.     PHYSICAL EXAM   Vitals:   12/12/18 1606  BP: (!) 166/80  Pulse: 82  Resp: 18  Weight: 272 lb (123.4 kg)  Height:  (1.702 m)    Not recorded      Body mass index is 42.6 kg/m.  PHYSICAL EXAMNIATION:  Gen: NAD, conversant, well nourised, obese, well groomed                     Cardiovascular: Regular rate rhythm, no peripheral edema, warm, nontender. Eyes: Conjunctivae clear without  exudates or hemorrhage Neck: Supple, no carotid bruits. Pulmonary: Clear to auscultation bilaterally   NEUROLOGICAL EXAM:  MENTAL STATUS: Deconditioned middle-aged female Speech:    Speech is normal; fluent and spontaneous with normal comprehension.  Cognition:     Orientation to time, place and person     Normal recent and remote memory     Normal Attention span and concentration     Normal Language, naming, repeating,spontaneous speech     Fund of knowledge   CRANIAL NERVES: CN II: Visual fields are full to confrontation. Pupils are round equal and briskly reactive to light. CN III, IV, VI: extraocular movement are normal. No ptosis. CN V: Facial sensation is intact to pinprick in all 3 divisions bilaterally. Corneal responses are intact.  CN VII: Face is symmetric with normal eye closure and smile. CN VIII: Hearing is normal to rubbing fingers CN IX, X: Palate elevates symmetrically. Phonation is normal. CN XI: Head turning and shoulder shrug are intact CN XII: Tongue is midline with normal movements and no atrophy.  MOTOR: There is no pronator drift of out-stretched arms. Muscle bulk and tone are normal. Muscle strength is normal.  REFLEXES: Reflexes are 2+ and symmetric at the biceps, triceps, knees, and absent at ankles. Plantar responses are flexor.  SENSORY: Length dependent decreased to light touch, pinprick  COORDINATION: Rapid alternating movements and fine finger movements are intact. There is no dysmetria on finger-to-nose and heel-knee-shin.    GAIT/STANCE: Need to push off chair to get up from seated position, wide-based, cautious, unsteady gait Romberg is absent.   DIAGNOSTIC DATA (LABS, IMAGING, TESTING) - I reviewed patient records, labs, notes, testing and imaging myself where available.   ASSESSMENT AND PLAN  RADHIKA BARTOSH is a 63 y.o. female   Acute onset of dizziness, nausea, unsteady gait on April 10, 2018  MRI brain was normal  She has  multiple vascular risk factor of hypertension, hyperlipidemia, diabetes, obesity, obstructive sleep apnea, deconditioning.  Her dizziness can be related to her orthostatic blood pressure changes from her diabetic peripheral neuropathy,  She could not tolerate Plavix, complains of excessive bleeding, will switch to aspirin 81 mg daily increase water intake, encouraged her moderate exercise,   Levert Feinstein, M.D. Ph.D.  [facility] [address] [place], [place] [address] Ph: [phone] Fax: [phone]  WE:[id number], Betty Burrow, FNP

## 2018-12-16 ENCOUNTER — Encounter: Payer: Self-pay | Admitting: Neurology

## 2018-12-28 DIAGNOSIS — I10 Essential (primary) hypertension: Secondary | ICD-10-CM | POA: Diagnosis not present

## 2018-12-28 DIAGNOSIS — E1140 Type 2 diabetes mellitus with diabetic neuropathy, unspecified: Secondary | ICD-10-CM | POA: Diagnosis not present

## 2018-12-28 DIAGNOSIS — R12 Heartburn: Secondary | ICD-10-CM | POA: Diagnosis not present

## 2018-12-29 ENCOUNTER — Other Ambulatory Visit: Payer: Self-pay

## 2018-12-30 ENCOUNTER — Ambulatory Visit: Payer: Federal, State, Local not specified - PPO | Admitting: Internal Medicine

## 2019-01-06 ENCOUNTER — Ambulatory Visit: Payer: Federal, State, Local not specified - PPO | Admitting: Internal Medicine

## 2019-01-16 ENCOUNTER — Other Ambulatory Visit: Payer: Self-pay | Admitting: Internal Medicine

## 2019-02-06 ENCOUNTER — Other Ambulatory Visit: Payer: Self-pay | Admitting: Internal Medicine

## 2019-02-06 ENCOUNTER — Ambulatory Visit: Payer: Federal, State, Local not specified - PPO | Admitting: Internal Medicine

## 2019-03-09 ENCOUNTER — Telehealth: Payer: Self-pay

## 2019-03-09 NOTE — Telephone Encounter (Signed)
Canceled 02/06/19. Called pt to reschedule appt. LVM requesting returned call.

## 2019-04-13 ENCOUNTER — Other Ambulatory Visit: Payer: Self-pay | Admitting: Internal Medicine

## 2019-05-17 ENCOUNTER — Other Ambulatory Visit: Payer: Self-pay | Admitting: Internal Medicine

## 2019-06-09 ENCOUNTER — Telehealth: Payer: Self-pay | Admitting: Internal Medicine

## 2019-06-09 NOTE — Telephone Encounter (Signed)
Informed of information below. Will inform the patient.   This message is to inform you that the patient has not yet read the following message. (Notification date: June 08, 2019)  RE: Appointment Request  From  Shan Levans To  [place] and Delivered  05/25/2019 4:28 PM  Per your request, we have scheduled your appointment with Dr. Cruzita Lederer on July 31, 2019 at 1:00 p.m. Please arrive at 12:45 p.m. for check-in. If for any reason you are unable to keep this appointment, please contact the office at [phone] to reschedule.     Previous Messages  ----- Message -----    From:[name]    Sent:05/24/2019 10:06 AM EDT     AQ:[id number] Cruzita Lederer, MD  Subject:Appointment Request   Appointment Request From: Betty [name]   With Provider: Philemon Kingdom, MD [facility]]   Preferred Date Range: Any   Preferred Times: Any Time   Reason for visit: Request an Appointment   Comments:  Follow up and medication    [address]  MyChart User Last Read On  [name] Not Read

## 2019-07-31 ENCOUNTER — Ambulatory Visit: Payer: Federal, State, Local not specified - PPO | Admitting: Internal Medicine

## 2019-08-24 ENCOUNTER — Ambulatory Visit: Payer: Federal, State, Local not specified - PPO | Admitting: Cardiovascular Disease

## 2019-08-24 ENCOUNTER — Other Ambulatory Visit: Payer: Self-pay

## 2019-08-24 ENCOUNTER — Other Ambulatory Visit: Payer: Self-pay | Admitting: Internal Medicine

## 2019-08-24 ENCOUNTER — Encounter: Payer: Self-pay | Admitting: Cardiovascular Disease

## 2019-08-24 VITALS — BP 154/76 | HR 87 | Resp 96 | Ht 67.0 in | Wt 285.0 lb

## 2019-08-24 DIAGNOSIS — I2510 Atherosclerotic heart disease of native coronary artery without angina pectoris: Secondary | ICD-10-CM | POA: Diagnosis not present

## 2019-08-24 DIAGNOSIS — Z23 Encounter for immunization: Secondary | ICD-10-CM

## 2019-08-24 DIAGNOSIS — R079 Chest pain, unspecified: Secondary | ICD-10-CM

## 2019-08-24 MED ORDER — NITROGLYCERIN 0.4 MG SL SUBL: 0.4000 mg | SUBLINGUAL_TABLET | SUBLINGUAL | 3 refills | Status: DC | PRN

## 2019-08-24 NOTE — Patient Instructions (Addendum)
Medication Instructions:  Your physician has recommended you make the following change in your medication:  1-Take 1 Nitroglycerin, under your tongue, while sitting. If no relief of pain may repeat Nitroglycerin, one tab every 5 minutes up to 3 tablets total over 15 minutes. If no relief CALL 911. If you have dizziness/lightheadness while taking Nitroglycerin, stop taking and call 911.  *If you need a refill on your cardiac medications before your next appointment, please call your pharmacy*  Lab Work:  If you have labs (blood work) drawn today and your tests are completely normal, you will receive your results only by: Marland Kitchen MyChart Message (if you have MyChart) OR . A paper copy in the mail If you have any lab test that is abnormal or we need to change your treatment, we will call you to review the results.  Testing/Procedures: Your physician has requested that you have a lexiscan myoview. For further information please visit [email or web address]. Please follow instruction sheet, as given.  Follow-Up: At [facility], you and your health needs are our priority.  As part of our continuing mission to provide you with exceptional heart care, we have created designated Provider Care Teams.  These Care Teams include your primary Cardiologist (physician) and Advanced Practice Providers (APPs -  Physician Assistants and Nurse Practitioners) who all work together to provide you with the care you need, when you need it.  Your next appointment:   6 months  The format for your next appointment:   In Person  Provider:   You may see Jenkins Rouge, MD or one of the following Advanced Practice Providers on your designated Care Team:    Truitt Merle, NP  Cecilie Kicks, NP  Kathyrn Drown, NP

## 2019-08-24 NOTE — Progress Notes (Signed)
Cardiology Office Note   Date:  08/24/2019   ID:  Betty [name], DOB [birth year], MRN [id number]  PCP:  Suella Broad, FNP  Cardiologist:   Jenkins Rouge, MD   No chief complaint on file.     History of Present Illness: Betty [name] is a 63 y.o. female who presents for consultation regarding CAD. Has not been seen at [facility] over [facility] years. Previously seen by Dr Jeffie Pollock. Had Stenting of LAD in 2006 Last myovue 2015 non ischemic and low risk. Anginal equivalent Tends to be dyspnea. CRF;s HTN, DM , HLD Has OSA. Intolerant to many statins has tolerated pravastatin in past. Originally from [place]  Seen by neuro for dizziness Vertiginous symptoms CT/MRI  head normal 04/11/18 ? Brainstem cerebellum stroke despite negative MRI started on plavix. Carotids no significant stenosis 08/25/18  Symptoms seem to be vertiginous. She has a constant sense of imbalance as well as neuropathy In LE''s. No cardiac symptoms Not clear to me that she needs to be on plavix since MRI negative With no evidence of posterior circulation stroke.     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 However has not had any more last 3 weeks Has no nitro at home     Past Medical History:  Diagnosis Date  . Arthritis   . CAD, NATIVE VESSEL   . DM (diabetes mellitus) ([place])   . Fibromyalgia   . HYPERLIPIDEMIA-MIXED   . HYPERTENSION, BENIGN   . Neuropathy   . Obesity   . Osteoarthritis   . Sleep apnea    Patient reported on 04/12/13  . Vertigo     Past Surgical History:  Procedure Laterality Date  . BREATH TEK H PYLORI N/A 03/08/2013   Procedure: BREATH TEK H PYLORI;  Surgeon: Gayland Curry, MD;  Location: [facility];  Service: General;  Laterality: N/A;  . CESAREAN SECTION    . CHOLECYSTECTOMY  1996  . CORONARY ANGIOPLASTY WITH STENT PLACEMENT  11/2004   DES - LAD  . CORONARY STENT PLACEMENT  11/2004     Current Outpatient Medications  Medication Sig Dispense Refill  . acetaminophen (TYLENOL ARTHRITIS PAIN) 650 MG CR tablet Take 650 mg by mouth as needed for pain.    . BD VEO INSULIN SYRINGE U/F 31G X 15/64" 1 ML MISC USE 1 NEEDLE 4 TIMES DAILY AS ADVISED 300 each 3  . beta carotene 25000 UNIT capsule Take 25,000 Units by mouth daily.      . Calcium Carbonate-Vitamin D (CALCIUM-VITAMIN D) 600-200 MG-UNIT CAPS Take 1 tablet by mouth daily.     . cetirizine (ZYRTEC) 10 MG tablet Take 10 mg by mouth daily.    . clopidogrel (PLAVIX) 75 MG tablet Take 1 tablet (75 mg total) by mouth daily. 30 tablet 11  . diphenhydrAMINE (BENADRYL) 25 mg capsule Take 25 mg by mouth every 6 (six) hours as needed for itching.    . fluorometholone (FML) 0.1 % ophthalmic suspension Place 1 drop into both eyes daily. Reported on 03/06/2016    . gabapentin (NEURONTIN) 100 MG capsule TAKE 1 CAPSULE BY MOUTH EACH NIGHT AT BEDTIME 30 capsule 4  . insulin NPH Human (NOVOLIN N RELION) 100 UNIT/ML injection INJECT up to 100 units daily under skin 30 mL 11  . Insulin Pen Needle (BD PEN NEEDLE NANO U/F)  32G X 4 MM MISC USE ONCE DAILY 30 each 0  . insulin regular (NOVOLIN R RELION) 100 units/mL injection Use up to 100 units a day 3 vial 11  . liraglutide (VICTOZA) 18 MG/3ML SOPN INJECT 1.8MG  DAILY 27 mL 1  . Magnesium 250 MG TABS Take 1 tablet by mouth daily.      . meclizine (ANTIVERT) 12.5 MG tablet Take 1 tablet (12.5 mg total) by mouth 3 (three) times daily as needed for dizziness or nausea. 30 tablet 0  . meloxicam (MOBIC) 7.5 MG tablet Take 7.5 mg by mouth daily.    . metFORMIN (GLUCOPHAGE-XR) 500 MG 24 hr tablet TAKE 1 TABLET BY MOUTH THREE TIMES DAILYWITH MEALS 180 tablet 5  . Misc Natural Products (OSTEO BI-FLEX JOINT SHIELD PO) Take 1 tablet by mouth daily.      . Omega-3 Fatty Acids (FISH OIL CONCENTRATE PO) Take 1 tablet by mouth daily.      . ondansetron (ZOFRAN) 4  MG tablet Take 4 mg by mouth every 8 (eight) hours as needed for nausea or vomiting.    . pravastatin (PRAVACHOL) 40 MG tablet TAKE 1 TABLET BY MOUTH DAILY 90 tablet 3  . Pumpkin Seed-Soy Germ (AZO BLADDER CONTROL/GO-LESS PO) Take 2 each by mouth.    . ramipril (ALTACE) 10 MG capsule TAKE 1 CAPSULE BY MOUTH DAILY 90 capsule 1  . ULTRATRAK PRO TEST test strip TEST BLOOD SUGAR 5 TIMES DAILY AS INSTRUCTED 450 each 1   No current facility-administered medications for this visit.     Allergies:   Lyrica [pregabalin]    Social History:  The patient  reports that she has never smoked. She has never used smokeless tobacco. She reports that she does not drink alcohol or use drugs.   Family History:  The patient's family history includes Arthritis in her mother; Cancer in her mother and unknown relative; Coronary artery disease in her unknown relative; Diabetes in her mother, sister, and unknown relative; Heart disease in her mother; Hypertension in her father and mother; Stroke in her father.    ROS:  Please see the history of present illness.   Otherwise, review of systems are positive for none.   All other systems are reviewed and negative.    PHYSICAL EXAM: VS:  BP (!) 154/76   Pulse 87   Resp (!) 96   Ht 5\' 7"  (1.702 m)   Wt 285 lb (129.3 kg)   BMI 44.64 kg/m  , BMI Body mass index is 44.64 kg/m. Affect appropriate Healthy:  appears stated age HEENT: normal Neck supple with no adenopathy JVP normal no bruits no thyromegaly Lungs clear with no wheezing and good diaphragmatic motion Heart:  S1/S2 no murmur, no rub, gallop or click PMI normal Abdomen: benighn, BS positve, no tenderness, no AAA no bruit.  No HSM or HJR Distal pulses intact with no bruits No edema Neuro non-focal Skin warm and dry No muscular weakness    EKG:  SR rate 78 normal 04/12/18   Recent Labs: No results found for requested labs within last 8760 hours.    Lipid Panel    Component Value Date/Time    CHOL 189 06/17/2017 1644   TRIG 302.0 (H) 06/17/2017 1644   HDL 37.40 (L) 06/17/2017 1644   CHOLHDL 5 06/17/2017 1644   VLDL 60.4 (H) 06/17/2017 1644   LDLCALC 93 01/09/2013 0936   LDLDIRECT 109.0 06/17/2017 1644      Wt Readings from Last 3 Encounters:  08/24/19  285 lb (129.3 kg)  12/12/18 272 lb (123.4 kg)  08/24/18 271 lb 8 oz (123.2 kg)      Other studies Reviewed: Additional studies/ records that were reviewed today include: Notes from [facility] old cardiology notes cath 2006 myovue 2015 Notes from primary , Dr Dan Europe Endocrine Dr .Terrace [place] neurology Head CT/MRI labs     ASSESSMENT AND PLAN:  1. CAD:  Distant stenting LAD in 2006 with normal myovue 2015 New exertional dyspnea and SSCP for a week now resolved Nitro called in Discussed options and favor risk stratification with lexiscan myovue first and low threshold to  Perform diagnostic cath  2. Dizziness:  Has been seen by ENT/Neuro clinical vertigo CT/MRI head normal sounds and carotid duplex normal  Like inner ear or neurologic issues  Carotid no obstructive dx 3. DM:  Discussed low carb diet.  Target hemoglobin A1c is 6.5 or less.  Continue current medications. 4. HLD:  Continue statin   5. HTN: Well controlled.  Continue current medications and low sodium Dash type diet.       Current medicines are reviewed at length with the patient today.  The patient does not have concerns regarding medicines.  The following changes have been made:  no change  Labs/ tests ordered today include  None  :  No orders of the defined types were placed in this encounter.    Disposition:   FU with cardiology in a year      Signed, Charlton Haws, MD  08/24/2019 4:22 PM    [facility] [address] [place], [place], [place]  [address] Phone: [phone]; Fax: [phone]

## 2019-08-24 NOTE — H&P (View-Only) (Signed)
Cardiology Office Note   Date:  08/24/2019   ID:  MERLENE [name], DOB [birth year], MRN [id number]  PCP:  Suella Broad, FNP  Cardiologist:   Jenkins Rouge, MD   No chief complaint on file.     History of Present Illness: Betty [name] is a 63 y.o. female who presents for consultation regarding CAD. Has not been seen at [facility] over 3 years. Previously seen by Dr Jeffie Pollock. Had Stenting of LAD in 2006 Last myovue 2015 non ischemic and low risk. Anginal equivalent Tends to be dyspnea. CRF;s HTN, DM , HLD Has OSA. Intolerant to many statins has tolerated pravastatin in past. Originally from [place]  Seen by neuro for dizziness Vertiginous symptoms CT/MRI  head normal 04/11/18 ? Brainstem cerebellum stroke despite negative MRI started on plavix. Carotids no significant stenosis 08/25/18  Symptoms seem to be vertiginous. She has a constant sense of imbalance as well as neuropathy In LE''s. No cardiac symptoms Not clear to me that she needs to be on plavix since MRI negative With no evidence of posterior circulation stroke.     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 However has not had any more last 3 weeks Has no nitro at home     Past Medical History:  Diagnosis Date  . Arthritis   . CAD, NATIVE VESSEL   . DM (diabetes mellitus) ([place])   . Fibromyalgia   . HYPERLIPIDEMIA-MIXED   . HYPERTENSION, BENIGN   . Neuropathy   . Obesity   . Osteoarthritis   . Sleep apnea    Patient reported on 04/12/13  . Vertigo     Past Surgical History:  Procedure Laterality Date  . BREATH TEK H PYLORI N/A 03/08/2013   Procedure: BREATH TEK H PYLORI;  Surgeon: Gayland Curry, MD;  Location: [facility];  Service: General;  Laterality: N/A;  . CESAREAN SECTION    . CHOLECYSTECTOMY  1996  . CORONARY ANGIOPLASTY WITH STENT PLACEMENT  11/2004   DES - LAD  . CORONARY STENT PLACEMENT  11/2004     Current Outpatient Medications  Medication Sig Dispense Refill  . acetaminophen (TYLENOL ARTHRITIS PAIN) 650 MG CR tablet Take 650 mg by mouth as needed for pain.    . BD VEO INSULIN SYRINGE U/F 31G X 15/64" 1 ML MISC USE 1 NEEDLE 4 TIMES DAILY AS ADVISED 300 each 3  . beta carotene 25000 UNIT capsule Take 25,000 Units by mouth daily.      . Calcium Carbonate-Vitamin D (CALCIUM-VITAMIN D) 600-200 MG-UNIT CAPS Take 1 tablet by mouth daily.     . cetirizine (ZYRTEC) 10 MG tablet Take 10 mg by mouth daily.    . clopidogrel (PLAVIX) 75 MG tablet Take 1 tablet (75 mg total) by mouth daily. 30 tablet 11  . diphenhydrAMINE (BENADRYL) 25 mg capsule Take 25 mg by mouth every 6 (six) hours as needed for itching.    . fluorometholone (FML) 0.1 % ophthalmic suspension Place 1 drop into both eyes daily. Reported on 03/06/2016    . gabapentin (NEURONTIN) 100 MG capsule TAKE 1 CAPSULE BY MOUTH EACH NIGHT AT BEDTIME 30 capsule 4  . insulin NPH Human (NOVOLIN N RELION) 100 UNIT/ML injection INJECT up to 100 units daily under skin 30 mL 11  . Insulin Pen Needle (BD PEN NEEDLE NANO U/F)  32G X 4 MM MISC USE ONCE DAILY 30 each 0  . insulin regular (NOVOLIN R RELION) 100 units/mL injection Use up to 100 units a day 3 vial 11  . liraglutide (VICTOZA) 18 MG/3ML SOPN INJECT 1.8MG DAILY 27 mL 1  . Magnesium 250 MG TABS Take 1 tablet by mouth daily.      . meclizine (ANTIVERT) 12.5 MG tablet Take 1 tablet (12.5 mg total) by mouth 3 (three) times daily as needed for dizziness or nausea. 30 tablet 0  . meloxicam (MOBIC) 7.5 MG tablet Take 7.5 mg by mouth daily.    . metFORMIN (GLUCOPHAGE-XR) 500 MG 24 hr tablet TAKE 1 TABLET BY MOUTH THREE TIMES DAILYWITH MEALS 180 tablet 5  . Misc Natural Products (OSTEO BI-FLEX JOINT SHIELD PO) Take 1 tablet by mouth daily.      . Omega-3 Fatty Acids (FISH OIL CONCENTRATE PO) Take 1 tablet by mouth daily.      . ondansetron (ZOFRAN) 4  MG tablet Take 4 mg by mouth every 8 (eight) hours as needed for nausea or vomiting.    . pravastatin (PRAVACHOL) 40 MG tablet TAKE 1 TABLET BY MOUTH DAILY 90 tablet 3  . Pumpkin Seed-Soy Germ (AZO BLADDER CONTROL/GO-LESS PO) Take 2 each by mouth.    . ramipril (ALTACE) 10 MG capsule TAKE 1 CAPSULE BY MOUTH DAILY 90 capsule 1  . ULTRATRAK PRO TEST test strip TEST BLOOD SUGAR 5 TIMES DAILY AS INSTRUCTED 450 each 1   No current facility-administered medications for this visit.     Allergies:   Lyrica [pregabalin]    Social History:  The patient  reports that she has never smoked. She has never used smokeless tobacco. She reports that she does not drink alcohol or use drugs.   Family History:  The patient's family history includes Arthritis in her mother; Cancer in her mother and unknown relative; Coronary artery disease in her unknown relative; Diabetes in her mother, sister, and unknown relative; Heart disease in her mother; Hypertension in her father and mother; Stroke in her father.    ROS:  Please see the history of present illness.   Otherwise, review of systems are positive for none.   All other systems are reviewed and negative.    PHYSICAL EXAM: VS:  BP (!) 154/76   Pulse 87   Resp (!) 96   Ht 5' 7" (1.702 m)   Wt 285 lb (129.3 kg)   BMI 44.64 kg/m  , BMI Body mass index is 44.64 kg/m. Affect appropriate Healthy:  appears stated age HEENT: normal Neck supple with no adenopathy JVP normal no bruits no thyromegaly Lungs clear with no wheezing and good diaphragmatic motion Heart:  S1/S2 no murmur, no rub, gallop or click PMI normal Abdomen: benighn, BS positve, no tenderness, no AAA no bruit.  No HSM or HJR Distal pulses intact with no bruits No edema Neuro non-focal Skin warm and dry No muscular weakness    EKG:  SR rate 78 normal 04/12/18   Recent Labs: No results found for requested labs within last 8760 hours.    Lipid Panel    Component Value Date/Time    CHOL 189 06/17/2017 1644   TRIG 302.0 (H) 06/17/2017 1644   HDL 37.40 (L) 06/17/2017 1644   CHOLHDL 5 06/17/2017 1644   VLDL 60.4 (H) 06/17/2017 1644   LDLCALC 93 01/09/2013 0936   LDLDIRECT 109.0 06/17/2017 1644      Wt Readings from Last 3 Encounters:  08/24/19   285 lb (129.3 kg)  12/12/18 272 lb (123.4 kg)  08/24/18 271 lb 8 oz (123.2 kg)      Other studies Reviewed: Additional studies/ records that were reviewed today include: Notes from [facility] old cardiology notes cath 2006 myovue 2015 Notes from primary , Dr Dan Europe Endocrine Dr .Terrace Arabia neurology Head CT/MRI labs     ASSESSMENT AND PLAN:  1. CAD:  Distant stenting LAD in 2006 with normal myovue 2015 New exertional dyspnea and SSCP for a week now resolved Nitro called in Discussed options and favor risk stratification with lexiscan myovue first and low threshold to  Perform diagnostic cath  2. Dizziness:  Has been seen by ENT/Neuro clinical vertigo CT/MRI head normal sounds and carotid duplex normal  Like inner ear or neurologic issues  Carotid no obstructive dx 3. DM:  Discussed low carb diet.  Target hemoglobin A1c is 6.5 or less.  Continue current medications. 4. HLD:  Continue statin   5. HTN: Well controlled.  Continue current medications and low sodium Dash type diet.       Current medicines are reviewed at length with the patient today.  The patient does not have concerns regarding medicines.  The following changes have been made:  no change  Labs/ tests ordered today include  None  :  No orders of the defined types were placed in this encounter.    Disposition:   FU with cardiology in a year      Signed, Charlton Haws, MD  08/24/2019 4:22 PM    [facility] [address] [place], [place], [place]  [address] Phone: [phone]; Fax: [phone]

## 2019-08-28 ENCOUNTER — Telehealth (HOSPITAL_COMMUNITY): Payer: Self-pay

## 2019-08-28 NOTE — Telephone Encounter (Signed)
Spoke with the patient's daughter, instructions given. She stated that she would have her here for her test. Asked to call back with any questions. S>Tarena Gockley  EMTP

## 2019-08-29 ENCOUNTER — Other Ambulatory Visit: Payer: Self-pay

## 2019-08-29 ENCOUNTER — Ambulatory Visit (HOSPITAL_COMMUNITY): Payer: Federal, State, Local not specified - PPO | Attending: Internal Medicine

## 2019-08-29 DIAGNOSIS — I2510 Atherosclerotic heart disease of native coronary artery without angina pectoris: Secondary | ICD-10-CM | POA: Diagnosis not present

## 2019-08-29 DIAGNOSIS — R079 Chest pain, unspecified: Secondary | ICD-10-CM

## 2019-08-29 MED ORDER — TECHNETIUM TC 99M TETROFOSMIN IV KIT
32.7000 | PACK | Freq: Once | INTRAVENOUS | Status: AC | PRN
  Administered 2019-08-29: 32.7 via INTRAVENOUS
  Filled 2019-08-29: qty 33

## 2019-08-29 MED ORDER — REGADENOSON 0.4 MG/5ML IV SOLN
0.4000 mg | Freq: Once | INTRAVENOUS | Status: AC
  Administered 2019-08-29: 0.4 mg via INTRAVENOUS

## 2019-08-30 ENCOUNTER — Ambulatory Visit (HOSPITAL_COMMUNITY): Payer: Federal, State, Local not specified - PPO | Attending: Cardiovascular Disease

## 2019-08-30 ENCOUNTER — Telehealth: Payer: Self-pay

## 2019-08-30 DIAGNOSIS — R079 Chest pain, unspecified: Secondary | ICD-10-CM

## 2019-08-30 DIAGNOSIS — Z01812 Encounter for preprocedural laboratory examination: Secondary | ICD-10-CM

## 2019-08-30 LAB — MYOCARDIAL PERFUSION IMAGING
LV dias vol: 139 mL (ref 46–106)
LV sys vol: 77 mL
Peak HR: 98 {beats}/min
Rest HR: 85 {beats}/min
SDS: 4
SRS: 4
SSS: 8
TID: 1.14

## 2019-08-30 MED ORDER — TECHNETIUM TC 99M TETROFOSMIN IV KIT
31.4000 | PACK | Freq: Once | INTRAVENOUS | Status: AC | PRN
  Administered 2019-08-30: 31.4 via INTRAVENOUS
  Filled 2019-08-30: qty 32

## 2019-08-30 MED ORDER — CARVEDILOL 3.125 MG PO TABS: 3.1250 mg | ORAL_TABLET | Freq: Two times a day (BID) | ORAL | 3 refills | Status: DC

## 2019-08-30 NOTE — Telephone Encounter (Signed)
-----   Message from Josue Hector, MD sent at 08/30/2019  3:04 PM EST ----- Abnormal myovue known CAD decreased EF Add coreg 3.125 bid and set up for right and left cath next week

## 2019-08-30 NOTE — Telephone Encounter (Signed)
Follow Up:    Pt's daughter returning your call from today.

## 2019-08-30 NOTE — Telephone Encounter (Signed)
Called patient about stress test results. Per Dr. Johnsie Cancel, Abnormal myovue known CAD decreased EF Add coreg 3.125 bid and set up for right and left cath next week. Informed patient that Dr. Johnsie Cancel would like her to have heart cath done next week. Patient stated she would need to speak to her daughter and find out when she she could do it. Patient stated she might need to wait for the following week, due to Thanksgiving. Will call patient back on Friday, to see what she decides. Will let Dr. Johnsie Cancel know.  Sent medication to patient's pharmacy of choice.

## 2019-09-01 ENCOUNTER — Other Ambulatory Visit: Payer: Federal, State, Local not specified - PPO | Admitting: *Deleted

## 2019-09-01 ENCOUNTER — Other Ambulatory Visit: Payer: Self-pay | Admitting: Internal Medicine

## 2019-09-01 ENCOUNTER — Other Ambulatory Visit: Payer: Self-pay

## 2019-09-01 DIAGNOSIS — Z01812 Encounter for preprocedural laboratory examination: Secondary | ICD-10-CM | POA: Diagnosis not present

## 2019-09-01 DIAGNOSIS — R079 Chest pain, unspecified: Secondary | ICD-10-CM | POA: Diagnosis not present

## 2019-09-01 NOTE — Telephone Encounter (Signed)
Called patient to let her know, that message was received to go ahead and schedule heart cath for next week. Patient will come in today to get lab work and instruction paper. Patient will have COVID test done tomorrow and heart cath done on Wednesday.

## 2019-09-02 ENCOUNTER — Other Ambulatory Visit (HOSPITAL_COMMUNITY)
Admission: RE | Admit: 2019-09-02 | Discharge: 2019-09-02 | Disposition: A | Discharge status: Home / Self Care | Payer: Federal, State, Local not specified - PPO | Source: Ambulatory Visit | Attending: Cardiovascular Disease | Admitting: Cardiovascular Disease

## 2019-09-02 DIAGNOSIS — Z01812 Encounter for preprocedural laboratory examination: Secondary | ICD-10-CM | POA: Insufficient documentation

## 2019-09-02 DIAGNOSIS — Z20828 Contact with and (suspected) exposure to other viral communicable diseases: Secondary | ICD-10-CM | POA: Insufficient documentation

## 2019-09-02 LAB — CBC WITH DIFFERENTIAL/PLATELET
Basophils Absolute: 0 10*3/uL (ref 0.0–0.2)
Basos: 0 %
EOS (ABSOLUTE): 0.2 10*3/uL (ref 0.0–0.4)
Eos: 2 %
Hematocrit: 37.6 % (ref 34.0–46.6)
Hemoglobin: 12.9 g/dL (ref 11.1–15.9)
Immature Grans (Abs): 0 10*3/uL (ref 0.0–0.1)
Immature Granulocytes: 0 %
Lymphocytes Absolute: 3.4 10*3/uL — ABNORMAL HIGH (ref 0.7–3.1)
Lymphs: 33 %
MCH: 29.8 pg (ref 26.6–33.0)
MCHC: 34.3 g/dL (ref 31.5–35.7)
MCV: 87 fL (ref 79–97)
Monocytes Absolute: 0.8 10*3/uL (ref 0.1–0.9)
Monocytes: 8 %
Neutrophils Absolute: 5.9 10*3/uL (ref 1.4–7.0)
Neutrophils: 57 %
Platelets: 252 10*3/uL (ref 150–450)
RBC: 4.33 x10E6/uL (ref 3.77–5.28)
RDW: 12.3 % (ref 11.7–15.4)
WBC: 10.3 10*3/uL (ref 3.4–10.8)

## 2019-09-02 LAB — BASIC METABOLIC PANEL
BUN/Creatinine Ratio: 17 (ref 12–28)
BUN: 13 mg/dL (ref 8–27)
CO2: 23 mmol/L (ref 20–29)
Calcium: 9.4 mg/dL (ref 8.7–10.3)
Chloride: 101 mmol/L (ref 96–106)
Creatinine, Ser: 0.75 mg/dL (ref 0.57–1.00)
GFR calc Af Amer: 97 mL/min/{1.73_m2} (ref >59)
GFR calc non Af Amer: 85 mL/min/{1.73_m2} (ref >59)
Glucose: 139 mg/dL — ABNORMAL HIGH (ref 65–99)
Potassium: 4.3 mmol/L (ref 3.5–5.2)
Sodium: 142 mmol/L (ref 134–144)

## 2019-09-03 ENCOUNTER — Other Ambulatory Visit: Payer: Self-pay | Admitting: Cardiovascular Disease

## 2019-09-03 DIAGNOSIS — R079 Chest pain, unspecified: Secondary | ICD-10-CM

## 2019-09-03 MED ORDER — SODIUM CHLORIDE 0.9% FLUSH: 3.0000 mL | Freq: Two times a day (BID) | INTRAVENOUS | Status: DC

## 2019-09-04 ENCOUNTER — Telehealth: Payer: Self-pay | Admitting: *Deleted

## 2019-09-04 ENCOUNTER — Telehealth: Payer: Self-pay

## 2019-09-04 LAB — NOVEL CORONAVIRUS, NAA (HOSP ORDER, SEND-OUT TO REF LAB; TAT 18-24 HRS): SARS-CoV-2, NAA: NOT DETECTED

## 2019-09-04 NOTE — Telephone Encounter (Signed)
Pt contacted pre-catheterization scheduled at [facility] for: Wednesday September 06, 2019 10 AM Verified arrival time and place: [facility]) at: 8 AM   No solid food after midnight prior to cath, clear liquids until 5 AM day of procedure. Contrast allergy: no  Hold: Insulin-AM of procedure. 1/2 usual HS Insulin dose PM prior to procedure. Victoza-AM of procedure. Metformin-day of procedure and 48 hours post procedure.  Except hold medications AM meds can be  taken pre-cath with sip of water including: ASA 81 mg   Confirmed patient has responsible adult to drive home post procedure and observe 24 hours after arriving home: yes  Currently, due to Covid-19 pandemic, only one support person will be allowed with patient. Must be the same support person for that patient's entire stay, will be screened and required to wear a mask. They will be asked to wait in the waiting room for the duration of the patient's stay.  Patients are required to wear a mask when they enter the hospital.      COVID-19 Pre-Screening Questions:  . In the past 7 to 10 days have you had a cough,  shortness of breath, headache, congestion, fever (100 or greater) body aches, chills, sore throat, or sudden loss of taste or sense of smell? no . Have you been around anyone with known Covid 19? no . Have you been around anyone who is awaiting Covid 19 test results in the past 7 to 10 days? no . Have you been around anyone who has been exposed to Covid 19, or has mentioned symptoms of Covid 19 within the past 7 to 10 days? no   I reviewed procedure/mask/visitor instructions, Covid-19 screening questions with patient's daughter (DPR), Chesley Noon, she verbalized understanding, thanked me for call.

## 2019-09-04 NOTE — Telephone Encounter (Signed)
I would like to see her first >> can we at least do a virtual appt on Wed? Maybe at 11:15?

## 2019-09-04 NOTE — Telephone Encounter (Signed)
MEDICATION: gabapentin (NEURONTIN) 100 MG capsule  PHARMACY:  [facility] - [place] - [address]  IS THIS A 90 DAY SUPPLY :   IS PATIENT OUT OF MEDICATION:   IF NOT; HOW MUCH IS LEFT:   LAST APPOINTMENT DATE: @11 /20/2020  NEXT APPOINTMENT DATE:@Visit  date not found  DO WE HAVE YOUR PERMISSION TO LEAVE A DETAILED MESSAGE:  OTHER COMMENTS:    **Let patient know to contact pharmacy at the end of the day to make sure medication is ready. **  ** Please notify patient to allow 48-72 hours to process**  **Encourage patient to contact the pharmacy for refills or they can request refills through [facility]**

## 2019-09-04 NOTE — Telephone Encounter (Signed)
Last office visit 08/22/2018  Cancel/No-show? 5  Future office visit scheduled? none  Please advise on refill.

## 2019-09-06 ENCOUNTER — Other Ambulatory Visit: Payer: Self-pay

## 2019-09-06 ENCOUNTER — Telehealth: Payer: Self-pay | Admitting: Cardiovascular Disease

## 2019-09-06 ENCOUNTER — Ambulatory Visit (HOSPITAL_COMMUNITY)
Admission: RE | Admit: 2019-09-06 | Discharge: 2019-09-06 | Disposition: A | Discharge status: Home / Self Care | Payer: Federal, State, Local not specified - PPO | Attending: Cardiovascular Disease | Admitting: Cardiovascular Disease

## 2019-09-06 ENCOUNTER — Encounter (HOSPITAL_COMMUNITY)
Admission: RE | Disposition: A | Discharge status: Home / Self Care | Payer: Self-pay | Source: Home / Self Care | Attending: Cardiovascular Disease

## 2019-09-06 DIAGNOSIS — M797 Fibromyalgia: Secondary | ICD-10-CM | POA: Insufficient documentation

## 2019-09-06 DIAGNOSIS — I10 Essential (primary) hypertension: Secondary | ICD-10-CM | POA: Diagnosis present

## 2019-09-06 DIAGNOSIS — Z6841 Body Mass Index (BMI) 40.0 and over, adult: Secondary | ICD-10-CM | POA: Diagnosis not present

## 2019-09-06 DIAGNOSIS — E1140 Type 2 diabetes mellitus with diabetic neuropathy, unspecified: Secondary | ICD-10-CM | POA: Insufficient documentation

## 2019-09-06 DIAGNOSIS — E1159 Type 2 diabetes mellitus with other circulatory complications: Secondary | ICD-10-CM

## 2019-09-06 DIAGNOSIS — I2510 Atherosclerotic heart disease of native coronary artery without angina pectoris: Secondary | ICD-10-CM

## 2019-09-06 DIAGNOSIS — R42 Dizziness and giddiness: Secondary | ICD-10-CM | POA: Insufficient documentation

## 2019-09-06 DIAGNOSIS — Z79899 Other long term (current) drug therapy: Secondary | ICD-10-CM | POA: Diagnosis not present

## 2019-09-06 DIAGNOSIS — R931 Abnormal findings on diagnostic imaging of heart and coronary circulation: Secondary | ICD-10-CM | POA: Diagnosis present

## 2019-09-06 DIAGNOSIS — M1990 Unspecified osteoarthritis, unspecified site: Secondary | ICD-10-CM | POA: Diagnosis not present

## 2019-09-06 DIAGNOSIS — E782 Mixed hyperlipidemia: Secondary | ICD-10-CM | POA: Insufficient documentation

## 2019-09-06 DIAGNOSIS — G4733 Obstructive sleep apnea (adult) (pediatric): Secondary | ICD-10-CM | POA: Insufficient documentation

## 2019-09-06 DIAGNOSIS — I25119 Atherosclerotic heart disease of native coronary artery with unspecified angina pectoris: Secondary | ICD-10-CM | POA: Diagnosis not present

## 2019-09-06 DIAGNOSIS — Z7902 Long term (current) use of antithrombotics/antiplatelets: Secondary | ICD-10-CM | POA: Diagnosis not present

## 2019-09-06 DIAGNOSIS — Z794 Long term (current) use of insulin: Secondary | ICD-10-CM | POA: Insufficient documentation

## 2019-09-06 DIAGNOSIS — E785 Hyperlipidemia, unspecified: Secondary | ICD-10-CM | POA: Diagnosis present

## 2019-09-06 DIAGNOSIS — Z955 Presence of coronary angioplasty implant and graft: Secondary | ICD-10-CM

## 2019-09-06 DIAGNOSIS — E669 Obesity, unspecified: Secondary | ICD-10-CM | POA: Diagnosis not present

## 2019-09-06 DIAGNOSIS — Z791 Long term (current) use of non-steroidal anti-inflammatories (NSAID): Secondary | ICD-10-CM | POA: Diagnosis not present

## 2019-09-06 DIAGNOSIS — E1165 Type 2 diabetes mellitus with hyperglycemia: Secondary | ICD-10-CM

## 2019-09-06 DIAGNOSIS — R079 Chest pain, unspecified: Secondary | ICD-10-CM

## 2019-09-06 HISTORY — PX: RIGHT/LEFT HEART CATH AND CORONARY ANGIOGRAPHY: CATH118266

## 2019-09-06 HISTORY — PX: CORONARY STENT INTERVENTION: CATH118234

## 2019-09-06 LAB — POCT I-STAT EG7
Bicarbonate: 27.6 mmol/L (ref 20.0–28.0)
Calcium, Ion: 1.27 mmol/L (ref 1.15–1.40)
HCT: 34 % — ABNORMAL LOW (ref 36.0–46.0)
Hemoglobin: 11.6 g/dL — ABNORMAL LOW (ref 12.0–15.0)
O2 Saturation: 76 %
Potassium: 4.2 mmol/L (ref 3.5–5.1)
Sodium: 140 mmol/L (ref 135–145)
TCO2: 29 mmol/L (ref 22–32)
pCO2, Ven: 58 mmHg (ref 44.0–60.0)
pH, Ven: 7.286 (ref 7.250–7.430)
pO2, Ven: 47 mmHg — ABNORMAL HIGH (ref 32.0–45.0)

## 2019-09-06 LAB — POCT I-STAT 7, (LYTES, BLD GAS, ICA,H+H)
Bicarbonate: 27.6 mmol/L (ref 20.0–28.0)
Calcium, Ion: 1.28 mmol/L (ref 1.15–1.40)
HCT: 34 % — ABNORMAL LOW (ref 36.0–46.0)
Hemoglobin: 11.6 g/dL — ABNORMAL LOW (ref 12.0–15.0)
O2 Saturation: 99 %
Potassium: 4.3 mmol/L (ref 3.5–5.1)
Sodium: 139 mmol/L (ref 135–145)
TCO2: 29 mmol/L (ref 22–32)
pCO2 arterial: 58.8 mmHg — ABNORMAL HIGH (ref 32.0–48.0)
pH, Arterial: 7.28 — ABNORMAL LOW (ref 7.350–7.450)
pO2, Arterial: 155 mmHg — ABNORMAL HIGH (ref 83.0–108.0)

## 2019-09-06 LAB — GLUCOSE, CAPILLARY
Glucose-Capillary: 169 mg/dL — ABNORMAL HIGH (ref 70–99)
Glucose-Capillary: 146 mg/dL — ABNORMAL HIGH (ref 70–99)

## 2019-09-06 LAB — POCT ACTIVATED CLOTTING TIME
Activated Clotting Time: 285 seconds
Activated Clotting Time: 224 seconds

## 2019-09-06 SURGERY — RIGHT/LEFT HEART CATH AND CORONARY ANGIOGRAPHY
Anesthesia: LOCAL

## 2019-09-06 MED ORDER — SODIUM CHLORIDE 0.9% FLUSH: 3.0000 mL | INTRAVENOUS | Status: DC | PRN

## 2019-09-06 MED ORDER — VERAPAMIL HCL 2.5 MG/ML IV SOLN
INTRAVENOUS | Status: DC | PRN
  Administered 2019-09-06: 10 mL via INTRA_ARTERIAL

## 2019-09-06 MED ORDER — NITROGLYCERIN 1 MG/10 ML FOR IR/CATH LAB
INTRA_ARTERIAL | Status: AC
  Filled 2019-09-06: qty 10

## 2019-09-06 MED ORDER — SODIUM CHLORIDE 0.9% FLUSH: 3.0000 mL | Freq: Two times a day (BID) | INTRAVENOUS | Status: DC

## 2019-09-06 MED ORDER — IOHEXOL 350 MG/ML SOLN
INTRAVENOUS | Status: DC | PRN
  Administered 2019-09-06: 110 mL

## 2019-09-06 MED ORDER — MIDAZOLAM HCL 2 MG/2ML IJ SOLN
INTRAMUSCULAR | Status: DC | PRN
  Administered 2019-09-06 (×2): 1 mg via INTRAVENOUS

## 2019-09-06 MED ORDER — CLOPIDOGREL BISULFATE 75 MG PO TABS: 75.0000 mg | ORAL_TABLET | Freq: Every day | ORAL | 0 refills | Status: DC

## 2019-09-06 MED ORDER — HEPARIN (PORCINE) IN NACL 1000-0.9 UT/500ML-% IV SOLN
INTRAVENOUS | Status: DC | PRN
  Administered 2019-09-06: 500 mL

## 2019-09-06 MED ORDER — SODIUM CHLORIDE 0.9 % WEIGHT BASED INFUSION: 1.0000 mL/kg/h | INTRAVENOUS | Status: DC

## 2019-09-06 MED ORDER — HEPARIN (PORCINE) IN NACL 1000-0.9 UT/500ML-% IV SOLN
INTRAVENOUS | Status: AC
  Filled 2019-09-06: qty 1000

## 2019-09-06 MED ORDER — ASPIRIN 81 MG PO CHEW: 81.0000 mg | CHEWABLE_TABLET | ORAL | Status: DC

## 2019-09-06 MED ORDER — ACETAMINOPHEN 325 MG PO TABS: 650.0000 mg | ORAL_TABLET | ORAL | Status: DC | PRN

## 2019-09-06 MED ORDER — LIDOCAINE HCL (PF) 1 % IJ SOLN
INTRAMUSCULAR | Status: AC
  Filled 2019-09-06: qty 30

## 2019-09-06 MED ORDER — HEPARIN SODIUM (PORCINE) 1000 UNIT/ML IJ SOLN
INTRAMUSCULAR | Status: AC
  Filled 2019-09-06: qty 1

## 2019-09-06 MED ORDER — FENTANYL CITRATE (PF) 100 MCG/2ML IJ SOLN
INTRAMUSCULAR | Status: DC | PRN
  Administered 2019-09-06 (×2): 25 ug via INTRAVENOUS

## 2019-09-06 MED ORDER — CLOPIDOGREL BISULFATE 75 MG PO TABS: 75.0000 mg | ORAL_TABLET | Freq: Every day | ORAL | Status: DC

## 2019-09-06 MED ORDER — MIDAZOLAM HCL 2 MG/2ML IJ SOLN
INTRAMUSCULAR | Status: AC
  Filled 2019-09-06: qty 2

## 2019-09-06 MED ORDER — HYDRALAZINE HCL 20 MG/ML IJ SOLN: 10.0000 mg | INTRAMUSCULAR | Status: DC | PRN

## 2019-09-06 MED ORDER — SODIUM CHLORIDE 0.9 % WEIGHT BASED INFUSION
3.0000 mL/kg/h | INTRAVENOUS | Status: AC
  Administered 2019-09-06: 3 mL/kg/h via INTRAVENOUS

## 2019-09-06 MED ORDER — ONDANSETRON HCL 4 MG/2ML IJ SOLN: 4.0000 mg | Freq: Four times a day (QID) | INTRAMUSCULAR | Status: DC | PRN

## 2019-09-06 MED ORDER — HEPARIN SODIUM (PORCINE) 1000 UNIT/ML IJ SOLN
INTRAMUSCULAR | Status: DC | PRN
  Administered 2019-09-06: 5000 [IU] via INTRAVENOUS
  Administered 2019-09-06: 6000 [IU] via INTRAVENOUS
  Administered 2019-09-06: 2000 [IU] via INTRAVENOUS

## 2019-09-06 MED ORDER — SODIUM CHLORIDE 0.9 % IV SOLN: 250.0000 mL | INTRAVENOUS | Status: DC | PRN

## 2019-09-06 MED ORDER — LIDOCAINE HCL (PF) 1 % IJ SOLN
INTRAMUSCULAR | Status: DC | PRN
  Administered 2019-09-06: 2 mL

## 2019-09-06 MED ORDER — CLOPIDOGREL BISULFATE 300 MG PO TABS
ORAL_TABLET | ORAL | Status: AC
  Filled 2019-09-06: qty 2

## 2019-09-06 MED ORDER — VERAPAMIL HCL 2.5 MG/ML IV SOLN
INTRAVENOUS | Status: AC
  Filled 2019-09-06: qty 2

## 2019-09-06 MED ORDER — FENTANYL CITRATE (PF) 100 MCG/2ML IJ SOLN
INTRAMUSCULAR | Status: AC
  Filled 2019-09-06: qty 2

## 2019-09-06 MED ORDER — CLOPIDOGREL BISULFATE 300 MG PO TABS
ORAL_TABLET | ORAL | Status: DC | PRN
  Administered 2019-09-06: 600 mg via ORAL

## 2019-09-06 MED ORDER — NITROGLYCERIN 1 MG/10 ML FOR IR/CATH LAB
INTRA_ARTERIAL | Status: DC | PRN
  Administered 2019-09-06 (×3): 150 ug via INTRACORONARY

## 2019-09-06 MED ORDER — LABETALOL HCL 5 MG/ML IV SOLN: 10.0000 mg | INTRAVENOUS | Status: DC | PRN

## 2019-09-06 MED ORDER — FAMOTIDINE IN NACL 20-0.9 MG/50ML-% IV SOLN
INTRAVENOUS | Status: AC
  Filled 2019-09-06: qty 50

## 2019-09-06 MED ORDER — FAMOTIDINE IN NACL 20-0.9 MG/50ML-% IV SOLN
INTRAVENOUS | Status: AC | PRN
  Administered 2019-09-06: 20 mg via INTRAVENOUS

## 2019-09-06 SURGICAL SUPPLY — 24 items
BALLN SAPPHIRE 1.5X12 (BALLOONS) ×2
BALLN SAPPHIRE 2.0X12 (BALLOONS) ×2
BALLN SAPPHIRE ~~LOC~~ 2.5X15 (BALLOONS) ×1 IMPLANT
BALLOON SAPPHIRE 1.5X12 (BALLOONS) IMPLANT
BALLOON SAPPHIRE 2.0X12 (BALLOONS) IMPLANT
CATH 5FR JL3.5 JR4 ANG PIG MP (CATHETERS) ×1 IMPLANT
CATH BALLN WEDGE 5F 110CM (CATHETERS) ×1 IMPLANT
CATH LAUNCHER 5F EBU3.5 (CATHETERS) ×1 IMPLANT
DEVICE RAD COMP TR BAND LRG (VASCULAR PRODUCTS) ×1 IMPLANT
GLIDESHEATH SLEND SS 6F .021 (SHEATH) ×1 IMPLANT
GUIDEWIRE INQWIRE 1.5J.035X260 (WIRE) IMPLANT
HOVERMATT SINGLE USE (MISCELLANEOUS) ×1 IMPLANT
INQWIRE 1.5J .035X260CM (WIRE) ×2
KIT ENCORE 26 ADVANTAGE (KITS) ×1 IMPLANT
KIT HEART LEFT (KITS) ×2 IMPLANT
KIT MICROPUNCTURE NIT STIFF (SHEATH) ×1 IMPLANT
PACK CARDIAC CATHETERIZATION (CUSTOM PROCEDURE TRAY) ×2 IMPLANT
SHEATH GLIDE SLENDER 4/5FR (SHEATH) ×1 IMPLANT
SHEATH PROBE COVER 6X72 (BAG) ×1 IMPLANT
STENT RESOLUTE ONYX 2.25X22 (Permanent Stent) ×1 IMPLANT
TRANSDUCER W/STOPCOCK (MISCELLANEOUS) ×2 IMPLANT
TUBING CIL FLEX 10 FLL-RA (TUBING) ×2 IMPLANT
WIRE HI TORQ BMW 190CM (WIRE) ×1 IMPLANT
WIRE HI TORQ WHISPER MS 190CM (WIRE) ×1 IMPLANT

## 2019-09-06 NOTE — Discharge Instructions (Signed)
Radial Site Care ° °This sheet gives you information about how to care for yourself after your procedure. Your health care provider may also give you more specific instructions. If you have problems or questions, contact your health care provider. °What can I expect after the procedure? °After the procedure, it is common to have: °· Bruising and tenderness at the catheter insertion area. °Follow these instructions at home: °Medicines °· Take over-the-counter and prescription medicines only as told by your health care provider. °Insertion site care °· Follow instructions from your health care provider about how to take care of your insertion site. Make sure you: °? Wash your hands with soap and water before you change your bandage (dressing). If soap and water are not available, use hand sanitizer. °? Change your dressing as told by your health care provider. °? Leave stitches (sutures), skin glue, or adhesive strips in place. These skin closures may need to stay in place for 2 weeks or longer. If adhesive strip edges start to loosen and curl up, you may trim the loose edges. Do not remove adhesive strips completely unless your health care provider tells you to do that. °· Check your insertion site every day for signs of infection. Check for: °? Redness, swelling, or pain. °? Fluid or blood. °? Pus or a bad smell. °? Warmth. °· Do not take baths, swim, or use a hot tub until your health care provider approves. °· You may shower 24-48 hours after the procedure, or as directed by your health care provider. °? Remove the dressing and gently wash the site with plain soap and water. °? Pat the area dry with a clean towel. °? Do not rub the site. That could cause bleeding. °· Do not apply powder or lotion to the site. °Activity ° °· For 24 hours after the procedure, or as directed by your health care provider: °? Do not flex or bend the affected arm. °? Do not push or pull heavy objects with the affected arm. °? Do not  drive yourself home from the hospital or clinic. You may drive 24 hours after the procedure unless your health care provider tells you not to. °? Do not operate machinery or power tools. °· Do not lift anything that is heavier than 10 lb (4.5 kg), or the limit that you are told, until your health care provider says that it is safe. °· Ask your health care provider when it is okay to: °? Return to work or school. °? Resume usual physical activities or sports. °? Resume sexual activity. °General instructions °· If the catheter site starts to bleed, raise your arm and put firm pressure on the site. If the bleeding does not stop, get help right away. This is a medical emergency. °· If you went home on the same day as your procedure, a responsible adult should be with you for the first 24 hours after you arrive home. °· Keep all follow-up visits as told by your health care provider. This is important. °Contact a health care provider if: °· You have a fever. °· You have redness, swelling, or yellow drainage around your insertion site. °Get help right away if: °· You have unusual pain at the radial site. °· The catheter insertion area swells very fast. °· The insertion area is bleeding, and the bleeding does not stop when you hold steady pressure on the area. °· Your arm or hand becomes pale, cool, tingly, or numb. °These symptoms may represent a serious problem   that is an emergency. Do not wait to see if the symptoms will go away. Get medical help right away. Call your local emergency services (911 in the [place] Do not drive yourself to the hospital. °Summary °· After the procedure, it is common to have bruising and tenderness at the site. °· Follow instructions from your health care provider about how to take care of your radial site wound. Check the wound every day for signs of infection. °· Do not lift anything that is heavier than 10 lb (4.5 kg), or the limit that you are told, until your health care provider says  that it is safe. °This information is not intended to replace advice given to you by your health care provider. Make sure you discuss any questions you have with your health care provider. °Document Released: 10/31/2010 Document Revised: 11/03/2017 Document Reviewed: 11/03/2017 °Elsevier Patient Education © 2020 [employer] ° °

## 2019-09-06 NOTE — Discharge Summary (Signed)
Discharge Summary/SAME DAY PCI    Patient ID: Betty [name],  MRN: [id number], DOB/AGE: Oct 17, 1955 63 y.o.  Admit date: 09/06/2019 Discharge date: 09/06/2019  Primary Care Provider: Suella Broad Primary Cardiologist: Dr. Johnsie Cancel   Discharge Diagnoses    Principal Problem:   Abnormal nuclear cardiac imaging test Active Problems:   Hyperlipidemia   HYPERTENSION, BENIGN   Uncontrolled type 2 diabetes mellitus with complication, with long-term current use of insulin (HCC)   Allergies Allergies  Allergen Reactions  . Lyrica [Pregabalin] Other (See Comments)    sleepy    Diagnostic Studies/Procedures    Cath: 09/06/19  1. Continued patency of the LAD stent 2. Patent RCA 3. Severe subtotal occlusion of the mid-circumflex treated with PCI using a 2.25x22 mm Resolute Onyx DES 4. Essentially normal right heart pressures and preserved cardiac output  Recommend: same day discharge if no complications arise. Follow-up Dr Johnsie Cancel. ASA/plavix without interruption x 6 months.  Diagnostic Dominance: Right  Intervention    _____________   History of Present Illness     Betty [name] is a 63 y.o. female who presents for consultation regarding CAD. Had not been seen at [facility] over 3 years. Previously seen by Dr Jeffie Pollock. Had Stenting of LAD in 2006. Last myovue 2015 non ischemic and low risk. Anginal equivalent tends to be dyspnea. CRF's include HTN, DM , HLD Has OSA. Intolerant to many statins has tolerated pravastatin in past. Originally from [place]  Seen by neuro for dizziness Vertiginous symptoms CT/MRI  head normal 04/11/18 ? Brainstem cerebellum stroke despite negative. MRI started on plavix. Carotids no significant stenosis 08/25/18.  Symptoms seemed to be vertiginous. She has a constant sense of imbalance as well as neuropathy In LE''s. No cardiac symptoms. With no evidence of posterior circulation stroke.     Hoping to retire from  IRS soon, mostly. 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    Had a week of SSCP and dyspnea which was anginal sounding walking to mail box. However has not had any more last 3 weeks. Given symptoms she was set up for outpatient cardiac cath.    Hospital Course     Underwent cardiac cath noted above with severe subtotal occlusion of the mLCx with PCI/DES x1. Patent RCA, and LAD stent. Plan for DAPT with ASA/plavix for at least 6 months. Seen by cardiac rehab while in short stay. No complications post cath. Radial site stable. Instructions/precautions regarding cath site care given prior to discharge. Of note she reported being on pravastatin in the past. Not sure if she is still taking, and has multiple statin intolerances. Consider lipid referral at follow up appt. Educated by PharmD prior to discharge.   Betty [name] was seen by Dr. Burt Knack and determined stable for discharge home. Follow up in the office has been arranged. Medications are listed below.   _____________  Discharge Vitals Blood pressure (!) 125/50, pulse 73, temperature (!) 97.2 F (36.2 C), temperature source Temporal, resp. rate 15, height 5\' 7"  (1.702 m), weight 129.3 kg, SpO2 94 %.  Filed Weights   09/06/19 0838  Weight: 129.3 kg    Labs & Radiologic Studies    CBC No results for input(s): WBC, NEUTROABS, HGB, HCT, MCV, PLT in the last 72 hours. Basic Metabolic Panel No results for input(s): NA, K, CL, CO2, GLUCOSE, BUN, CREATININE, CALCIUM, MG, PHOS in the last 72 hours. Liver Function Tests No results for input(s): AST, ALT, ALKPHOS, BILITOT,  PROT, ALBUMIN in the last 72 hours. No results for input(s): LIPASE, AMYLASE in the last 72 hours. Cardiac Enzymes No results for input(s): CKTOTAL, CKMB, CKMBINDEX, TROPONINI in the last 72 hours. BNP Invalid input(s): POCBNP D-Dimer No results for input(s): DDIMER in the last 72 hours. Hemoglobin A1C No results for input(s): HGBA1C in the last 72 hours.  Fasting Lipid Panel No results for input(s): CHOL, HDL, LDLCALC, TRIG, CHOLHDL, LDLDIRECT in the last 72 hours. Thyroid Function Tests No results for input(s): TSH, T4TOTAL, T3FREE, THYROIDAB in the last 72 hours.  Invalid input(s): FREET3 _____________  No results found. Disposition   Pt is being discharged home today in good condition.  Follow-up Plans & Appointments    Follow-up Information    NeskowinBhagat, Sharrell KuBhavinkumar, [place] Follow up on 09/29/2019.   Specialty: Cardiology Why: at 9am for your follow up appt.  Contact information: [address] [place] [id number] [phone]           Discharge Medications     Medication List    STOP taking these medications   meloxicam 7.5 MG tablet Commonly known as: MOBIC     TAKE these medications   aspirin EC 81 MG tablet Take 81 mg by mouth daily.   AZO BLADDER CONTROL/GO-LESS PO Take 1 each by mouth 2 (two) times daily.   BD Pen Needle Nano U/F 32G X 4 MM Misc Generic drug: Insulin Pen Needle USE ONCE DAILY   BD Veo Insulin Syringe U/F 31G X 15/64" 1 ML Misc Generic drug: Insulin Syringe-Needle U-100 USE 1 NEEDLE 4 TIMES DAILY AS ADVISED   beta carotene [id number] UNIT capsule Take 25,000 Units by mouth daily.   Calcium-Vitamin D 600-200 MG-UNIT Caps Take 1 tablet by mouth every evening.   carvedilol 3.125 MG tablet Commonly known as: COREG Take 1 tablet (3.125 mg total) by mouth 2 (two) times daily with a meal.   cetirizine 10 MG tablet Commonly known as: ZYRTEC Take 10 mg by mouth at bedtime.   clopidogrel 75 MG tablet Commonly known as: Plavix Take 1 tablet (75 mg total) by mouth daily.   diphenhydrAMINE 25 mg capsule Commonly known as: BENADRYL Take 25 mg by mouth every 6 (six) hours as needed for itching.   FISH OIL CONCENTRATE PO Take 1 tablet by mouth daily.   gabapentin 100 MG capsule Commonly known as: NEURONTIN TAKE 1 CAPSULE BY MOUTH EACH NIGHT AT BEDTIME What changed: See the  new instructions.   insulin NPH Human 100 UNIT/ML injection Commonly known as: NovoLIN N ReliOn INJECT up to 100 units daily under skin What changed:   how much to take  how to take this  when to take this  additional instructions   liraglutide 18 MG/3ML Sopn Commonly known as: Victoza INJECT 1.8MG  DAILY What changed:   how much to take  how to take this  when to take this   Magnesium 250 MG Tabs Take 250 mg by mouth daily.   metFORMIN 500 MG 24 hr tablet Commonly known as: GLUCOPHAGE-XR TAKE 1 TABLET BY MOUTH THREE TIMES DAILYWITH MEALS What changed: See the new instructions.   nitroGLYCERIN 0.4 MG SL tablet Commonly known as: NITROSTAT Place 1 tablet (0.4 mg total) under the tongue every 5 (five) minutes as needed for chest pain.   NovoLIN R ReliOn 100 units/mL injection Generic drug: insulin regular USE UP TO 100 UNITS A DAY What changed: See the new instructions.   ondansetron 4 MG tablet Commonly  known as: ZOFRAN Take 4 mg by mouth every 8 (eight) hours as needed for nausea or vomiting.   OSTEO BI-FLEX JOINT SHIELD PO Take 1 tablet by mouth daily.   ramipril 10 MG capsule Commonly known as: ALTACE TAKE 1 CAPSULE BY MOUTH DAILY   THERAWORX RELIEF EX Apply 1 application topically 3 (three) times daily as needed (pain.).   Tylenol Arthritis Pain 650 MG CR tablet Generic drug: acetaminophen Take 650-1,300 mg by mouth every 8 (eight) hours as needed for pain.   UltraTRAK PRO Test test strip Generic drug: glucose blood TEST BLOOD SUGAR 5 TIMES DAILY AS INSTRUCTED   VICKS VAPOINHALER IN Place 1 Dose into both nostrils as needed (congestion.).   Vitamin D3 250 MCG (10000 UT) capsule Take 10,000 Units by mouth daily.   ZIMS MAX-FREEZE EX Apply 1 application topically 4 (four) times daily as needed (pain).        No                               Did the patient have a percutaneous coronary intervention (stent / angioplasty)?:  Yes.      Cath/PCI Registry Performance & Quality Measures: 1. Aspirin prescribed? - Yes 2. ADP Receptor Inhibitor (Plavix/Clopidogrel, Brilinta/Ticagrelor or Effient/Prasugrel) prescribed (includes medically managed patients)? - Yes 3. High Intensity Statin (Lipitor 40-80mg  or Crestor 20-40mg ) prescribed? - No - statin intolerance 4. For EF <40%, was ACEI/ARB prescribed? - Yes 5. For EF <40%, Aldosterone Antagonist (Spironolactone or Eplerenone) prescribed? - Not Applicable (EF >/= 40%) 6. Cardiac Rehab Phase II ordered (Included Medically managed Patients)? - Yes  Outstanding Labs/Studies   N/a   Duration of Discharge Encounter   Greater than 30 minutes including physician time.  Signed, Laverda Page NP-C 09/06/2019, 1:21 PM

## 2019-09-06 NOTE — Telephone Encounter (Signed)
Patient has TOC appointment scheduled for 09/29/19 at 9:00 AM with Bhagat per Reino Bellis

## 2019-09-06 NOTE — Progress Notes (Signed)
CARDIAC REHAB PHASE I   Stent education completed with pt. Pt educated on importance of ASA, Plavix, and NTG. Pt given heart healthy and diabetic diets. Reviewed restrictions, site care, and exercise guidelines. Will refer to CRP II [place]. Pt is interested in participating in Virtual Cardiac and Pulmonary Rehab. Pt advised that Virtual Cardiac and Pulmonary Rehab is provided at no cost to the patient.  Checklist:  1. Pt has smart device  ie smartphone and/or ipad for downloading an app  Yes 2. Reliable internet/wifi service    Yes 3. Understands how to use their smartphone and navigate within an app.  Yes  Pt verbalized understanding and is in agreement.  [phone] Rufina Falco, RN BSN 09/06/2019 1:45 PM

## 2019-09-06 NOTE — Interval H&P Note (Signed)
Cath Lab Visit (complete for each Cath Lab visit)  Clinical Evaluation Leading to the Procedure:   ACS: No.  Non-ACS:    Anginal Classification: CCS III  Anti-ischemic medical therapy: No Therapy  Non-Invasive Test Results: Intermediate-risk stress test findings: cardiac mortality 1-3%/year  Prior CABG: No previous CABG   History and Physical Interval Note:  09/06/2019 9:16 AM  Betty [name]  has presented today for surgery, with the diagnosis of Abnormal myoview.  The various methods of treatment have been discussed with the patient and family. After consideration of risks, benefits and other options for treatment, the patient has consented to  Procedure(s): RIGHT/LEFT HEART CATH AND CORONARY ANGIOGRAPHY (N/A) as a surgical intervention.  The patient's history has been reviewed, patient examined, no change in status, stable for surgery.  I have reviewed the patient's chart and labs.  Questions were answered to the patient's satisfaction.     Sherren Mocha

## 2019-09-08 ENCOUNTER — Encounter (HOSPITAL_COMMUNITY): Payer: Self-pay | Admitting: Cardiovascular Disease

## 2019-09-11 NOTE — Telephone Encounter (Signed)
TCM Call 1st Attempt: I left a message asking the pt to call me back at [phone].

## 2019-09-12 ENCOUNTER — Ambulatory Visit: Payer: Federal, State, Local not specified - PPO | Admitting: Internal Medicine

## 2019-09-12 NOTE — Telephone Encounter (Signed)
Patient contacted regarding discharge from [facility] on 09/06/2019.  Patient understands to follow up with provider Robbie Lis, PA-c on 09/29/2019 at 9:00 at [facility] in [place], [place] [address] Patient understands discharge instructions? Yes Patient understands medications and regiment? Yes Patient understands to bring all medications to this visit? Yes

## 2019-09-17 ENCOUNTER — Other Ambulatory Visit: Payer: Self-pay | Admitting: Internal Medicine

## 2019-09-18 ENCOUNTER — Other Ambulatory Visit: Payer: Self-pay | Admitting: Internal Medicine

## 2019-09-18 ENCOUNTER — Telehealth: Payer: Self-pay

## 2019-09-18 NOTE — Telephone Encounter (Signed)
MEDICATION: NOVOLIN R RELION 100 UNIT/ML injection   PHARMACY:  [facility], [place] - [address]  IS THIS A 90 DAY SUPPLY :   IS PATIENT OUT OF MEDICATION:   IF NOT; HOW MUCH IS LEFT:   LAST APPOINTMENT DATE: @12 /03/2019  NEXT APPOINTMENT DATE:@1 /29/2021  DO WE HAVE YOUR PERMISSION TO LEAVE A DETAILED MESSAGE:  OTHER COMMENTS:    **Let patient know to contact pharmacy at the end of the day to make sure medication is ready. **  ** Please notify patient to allow 48-72 hours to process**  **Encourage patient to contact the pharmacy for refills or they can request refills through [facility]**    MEDICATION: gabapentin (NEURONTIN) 100 MG capsule   PHARMACY:  [facility] - [place] - [address]  IS THIS A 90 DAY SUPPLY :   IS PATIENT OUT OF MEDICATION:   IF NOT; HOW MUCH IS LEFT:   LAST APPOINTMENT DATE: @12 /03/2019  NEXT APPOINTMENT DATE:@1 /29/2021  DO WE HAVE YOUR PERMISSION TO LEAVE A DETAILED MESSAGE:  OTHER COMMENTS:    **Let patient know to contact pharmacy at the end of the day to make sure medication is ready. **  ** Please notify patient to allow 48-72 hours to process**  **Encourage patient to contact the pharmacy for refills or they can request refills through [facility]**

## 2019-09-18 NOTE — Telephone Encounter (Signed)
Last office visit 08/22/2018  Cancel/No-show? 6  Future office visit scheduled? Patient had one scheduled 09/12/2019  But canceled  Please advise on refill.

## 2019-09-18 NOTE — Telephone Encounter (Signed)
Per Dr. Cruzita Lederer, patient needs to have appointment.  Philemon Kingdom, MD  You 9 minutes ago (1:58 PM)     This is OTC. I will need to see her for refills.      Documentation

## 2019-09-18 NOTE — Telephone Encounter (Signed)
Patient has not been seen by [place] since Nov 2019 and has 6 no show or cancel.  Last office visit 08/22/2018  Cancel/No-show? 6  Future office visit scheduled? Patient had one scheduled 09/12/2019  But canceled

## 2019-09-18 NOTE — Telephone Encounter (Signed)
This is OTC. I will need to see her for refills.

## 2019-09-20 ENCOUNTER — Other Ambulatory Visit: Payer: Self-pay | Admitting: Internal Medicine

## 2019-09-20 ENCOUNTER — Encounter: Payer: Self-pay | Admitting: Internal Medicine

## 2019-09-21 ENCOUNTER — Encounter: Payer: Self-pay | Admitting: Internal Medicine

## 2019-09-21 ENCOUNTER — Other Ambulatory Visit: Payer: Self-pay

## 2019-09-21 ENCOUNTER — Ambulatory Visit: Payer: Federal, State, Local not specified - PPO | Admitting: Internal Medicine

## 2019-09-21 ENCOUNTER — Other Ambulatory Visit (INDEPENDENT_AMBULATORY_CARE_PROVIDER_SITE_OTHER): Payer: Federal, State, Local not specified - PPO

## 2019-09-21 VITALS — BP 130/78 | HR 82 | Ht 67.0 in | Wt 282.0 lb

## 2019-09-21 DIAGNOSIS — Z794 Long term (current) use of insulin: Secondary | ICD-10-CM | POA: Diagnosis not present

## 2019-09-21 DIAGNOSIS — IMO0002 Reserved for concepts with insufficient information to code with codable children: Secondary | ICD-10-CM

## 2019-09-21 DIAGNOSIS — E1165 Type 2 diabetes mellitus with hyperglycemia: Secondary | ICD-10-CM | POA: Diagnosis not present

## 2019-09-21 DIAGNOSIS — E1142 Type 2 diabetes mellitus with diabetic polyneuropathy: Secondary | ICD-10-CM | POA: Diagnosis not present

## 2019-09-21 DIAGNOSIS — E785 Hyperlipidemia, unspecified: Secondary | ICD-10-CM

## 2019-09-21 DIAGNOSIS — E118 Type 2 diabetes mellitus with unspecified complications: Secondary | ICD-10-CM | POA: Diagnosis not present

## 2019-09-21 LAB — POCT GLYCOSYLATED HEMOGLOBIN (HGB A1C): Hemoglobin A1C: 8 % — AB (ref 4.0–5.6)

## 2019-09-21 LAB — LIPID PANEL
Cholesterol: 175 mg/dL (ref 0–200)
HDL: 42.9 mg/dL (ref >39.00)
NonHDL: 132.18
Total CHOL/HDL Ratio: 4
Triglycerides: 282 mg/dL — ABNORMAL HIGH (ref 0.0–149.0)
VLDL: 56.4 mg/dL — ABNORMAL HIGH (ref 0.0–40.0)

## 2019-09-21 LAB — LDL CHOLESTEROL, DIRECT: Direct LDL: 108 mg/dL

## 2019-09-21 MED ORDER — METFORMIN HCL ER 500 MG PO TB24: 500.0000 mg | ORAL_TABLET | Freq: Three times a day (TID) | ORAL | 3 refills | Status: DC

## 2019-09-21 MED ORDER — VICTOZA 18 MG/3ML ~~LOC~~ SOPN: 1.2000 mg | PEN_INJECTOR | Freq: Every day | SUBCUTANEOUS | 3 refills | Status: DC

## 2019-09-21 MED ORDER — ROSUVASTATIN CALCIUM 10 MG PO TABS: 10.0000 mg | ORAL_TABLET | Freq: Every day | ORAL | 3 refills | Status: DC

## 2019-09-21 MED ORDER — INSULIN NPH (HUMAN) (ISOPHANE) 100 UNIT/ML ~~LOC~~ SUSP: 36.0000 [IU] | Freq: Two times a day (BID) | SUBCUTANEOUS | 11 refills | Status: DC

## 2019-09-21 MED ORDER — INSULIN REGULAR HUMAN 100 UNIT/ML IJ SOLN: 22.0000 [IU] | Freq: Three times a day (TID) | INTRAMUSCULAR | 11 refills | Status: DC

## 2019-09-21 NOTE — Progress Notes (Addendum)
Patient ID: Betty [name], female   DOB: [birth year], 63 y.o.   MRN: [id number]  This visit occurred during the SARS-CoV-2 public health emergency.  Safety protocols were in place, including screening questions prior to the visit, additional usage of staff PPE, and extensive cleaning of exam room while observing appropriate contact time as indicated for disinfecting solutions.   HPI: Betty [name] is a 63 y.o. female, returning for f/u for DM2, dx >10 years ago, insulin-dependent, uncontrolled, with complications (CAD - s/p stent, peripheral neuropathy). Last visit 1 year and 1 month ago.   She is working from home during the coronavirus pandemic.  She plans to retire soon.  She had a recent stress test with Dr. Eden Emms as she had CP with exertion >> positive >> had a heart cath >> had a stent placed.  Reviewed HbA1c levels: Lab Results  Component Value Date   HGBA1C 7.3 (A) 08/22/2018   HGBA1C 7.0 (A) 05/03/2018   HGBA1C 7.6 12/31/2017   Pt is on a regimen of: - Metformin XR 500 mg 3x a day - Victoza 1.2 mg daily-she had constipation with higher doses.  Breakfast  Lunch  Dinner bedtime  Insulin N: 36 units -  - 36 units  Insulin R: 20 units with a  smaller meal                 24 units wiith a  regular meal 20 units with a smaller meal 24 units with a larger meal 20 units with a smaller meal 24 units with a larger meal  -  For sugars: 71-90: take 16 units of R insulin 50-70: do not take R insulin We tried Bydureon 2 mg weekly (added 08/2015) >> not covered Was previously on 70/30 Insulin: 70-20-30 (split in 3 b/c she was dropping her sugars overnight). On these, she was still having lows in am (40s). She was on Novolog in the past but could not afford it.  She was also on Amaryl and Avandia. She has bladder dysfxn - cannot try SGLT2 inh.  She had diarrhea with regular metformin.  Meter: Liv[place]  Pt checks her sugars 2 times a day:  Before meals: Average 149  (93-230) After meals: Average 186 (132-243)   Previously: - am:68, 70-155, 204 >> (79, 98 - not eating) 131-146 >> 111-130s, 200  - 2h after b'fast:  >> 145, 215 >> n/c - before lunch:  84-160, 242 >> 101, 124 >> 110-140 - 2h after lunch: 151, 199 >> n/c >> 109, 184 >> n/c - before dinner:92-160, 192 >> 131, 178 >> 90-120s, 200 - bedtime: n/c >> 114, 160 >> 84, 122 >> n/c Lowest CBG:50s >> 68 >> 79 >> 60 >> 70; she has hypoglycemia awareness in the 80s.  No CKD, last BUN/creatinine was:  Lab Results  Component Value Date   BUN 13 09/01/2019   Lab Results  Component Value Date   CREATININE 0.75 09/01/2019  On ramipril.  + HL; Last set of lipids: Lab Results  Component Value Date   CHOL 189 06/17/2017   HDL 37.40 (L) 06/17/2017   LDLCALC 93 01/09/2013   LDLDIRECT 109.0 06/17/2017   TRIG 302.0 (H) 06/17/2017   CHOLHDL 5 06/17/2017  Prev. on Pravachol and she is not sure why she is not taking this anymore.  She was on Lipitor >> had mm cramps. On Fish oil. - eye exam: 08/2018: No DR, + glaucoma, + cataracts. Dr Senaida Ores -She has numbness and tingling in  her legs.  She was previously on Lyrica but had to stop due to somnolence.  She also had somnolence with the 300 mg Neurontin tablets >> then 100 mg daily >> now off.  She sees neurology (for  vertigo). Sees Dr Ronnald Ramp (podiatrist).   She also has a history of HL; HTN; OSA, obesity OA - on Osteobiflex, vitamin D deficiency.  Before last visit, she developed severe nausea and vertigo on 04/10/2018. She was on Reglan (per Dr. Fuller Plan) that helped with the nausea >> now off >> she was out of work because of this for a period of time.  She contacted our office and  I advised her to stop Victoza but her vertigo did not improve so we restarted the dose up.  Of note, brain MRI was negative for a stroke.  ROS: Constitutional: no weight gain/no weight loss, no fatigue, no subjective hyperthermia, no subjective hypothermia Eyes: no blurry  vision, no xerophthalmia ENT: no sore throat, no nodules palpated in neck, no dysphagia, no odynophagia, no hoarseness Cardiovascular: no CP/no SOB/no palpitations/no leg swelling Respiratory: no cough/no SOB/no wheezing Gastrointestinal: no N/no V/no D/no C/no acid reflux Musculoskeletal: no muscle aches/no joint aches Skin: no rashes, no hair loss Neurological: no tremors/+ numbness/+ tingling/no dizziness  I reviewed pt's medications, allergies, PMH, social hx, family hx, and changes were documented in the history of present illness. Otherwise, unchanged from my initial visit note.   Past Medical History:  Diagnosis Date  . Arthritis   . CAD, NATIVE VESSEL   . DM (diabetes mellitus) ([place])   . Fibromyalgia   . HYPERLIPIDEMIA-MIXED   . HYPERTENSION, BENIGN   . Neuropathy   . Obesity   . Osteoarthritis   . Sleep apnea    Patient reported on 04/12/13  . Vertigo    Past Surgical History:  Procedure Laterality Date  . BREATH TEK H PYLORI N/A 03/08/2013   Procedure: BREATH TEK H PYLORI;  Surgeon: Gayland Curry, MD;  Location: [facility];  Service: General;  Laterality: N/A;  . CESAREAN SECTION    . CHOLECYSTECTOMY  1996  . CORONARY ANGIOPLASTY WITH STENT PLACEMENT  11/2004   DES - LAD  . CORONARY STENT INTERVENTION N/A 09/06/2019   Procedure: CORONARY STENT INTERVENTION;  Surgeon: Sherren Mocha, MD;  Location: [facility];  Service: Cardiovascular;  Laterality: N/A;  . CORONARY STENT PLACEMENT  11/2004  . RIGHT/LEFT HEART CATH AND CORONARY ANGIOGRAPHY N/A 09/06/2019   Procedure: RIGHT/LEFT HEART CATH AND CORONARY ANGIOGRAPHY;  Surgeon: Sherren Mocha, MD;  Location: [facility];  Service: Cardiovascular;  Laterality: N/A;   Social History   Socioeconomic History  . Marital status: Divorced    Spouse name: Not on file  . Number of children: 2  . Years of education: 12  . Highest education level: High school graduate  Occupational History  . 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 Smoking status: Never Smoker  . Smokeless tobacco: Never Used  Substance and Sexual Activity  . Alcohol use: No  . Drug use: No  . Sexual activity: Not on file  Other Topics Concern  . Not on file  Social History Narrative   Lives alone.   Right-handed.   Caffeine use:  She will sometimes drink up to three 20-oz bottles of soda per day.  Drinks 0.5 bottles of a 5-hour energy drink daily.   Social Determinants of Health   Financial Resource Strain:   . Difficulty of  Paying Living Expenses: Not on file  Food Insecurity:   . Worried About Programme researcher, broadcasting/film/videounning Out of Food in the Last Year: Not on file  . Ran Out of Food in the Last Year: Not on file  Transportation Needs:   . Lack of Transportation (Medical): Not on file  . Lack of Transportation (Non-Medical): Not on file  Physical Activity:   . Days of Exercise per Week: Not on file  . Minutes of Exercise per Session: Not on file  Stress:   . Feeling of Stress : Not on file  Social Connections:   . Frequency of Communication with Friends and Family: Not on file  . Frequency of Social Gatherings with Friends and Family: Not on file  . Attends Religious Services: Not on file  . Active Member of Clubs or Organizations: Not on file  . Attends BankerClub or Organization Meetings: Not on file  . Marital Status: Not on file  Intimate Partner Violence:   . Fear of Current or Ex-Partner: Not on file  . Emotionally Abused: Not on file  . Physically Abused: Not on file  . Sexually Abused: Not on file   Current Outpatient Medications on File Prior to Visit  Medication Sig Dispense Refill  . acetaminophen (TYLENOL ARTHRITIS PAIN) 650 MG CR tablet Take 650-1,300 mg by mouth every 8 (eight) hours as needed for pain.     . Aromatic Inhalants (VICKS VAPOINHALER IN) Place 1 Dose into both nostrils as needed (congestion.).    Marland Kitchen. aspirin EC 81 MG tablet Take 81 mg by mouth daily.    . BD PEN NEEDLE NANO U/F 32G X 4 MM MISC USE  ONCE DAILY 100 each 11  . BD VEO INSULIN SYRINGE U/F 31G X 15/64" 1 ML MISC USE 1 NEEDLE 4 TIMES DAILY AS ADVISED 300 each 3  . beta carotene [id number] UNIT capsule Take 25,000 Units by mouth daily.      . Calcium Carbonate-Vitamin D (CALCIUM-VITAMIN D) 600-200 MG-UNIT CAPS Take 1 tablet by mouth every evening.     . carvedilol (COREG) 3.125 MG tablet Take 1 tablet (3.125 mg total) by mouth 2 (two) times daily with a meal. 180 tablet 3  . cetirizine (ZYRTEC) 10 MG tablet Take 10 mg by mouth at bedtime.     . Cholecalciferol (VITAMIN D3) 250 MCG (10000 UT) capsule Take 10,000 Units by mouth daily.    . clopidogrel (PLAVIX) 75 MG tablet Take 1 tablet (75 mg total) by mouth daily. 30 tablet 0  . diphenhydrAMINE (BENADRYL) 25 mg capsule Take 25 mg by mouth every 6 (six) hours as needed for itching.    . gabapentin (NEURONTIN) 100 MG capsule TAKE 1 CAPSULE BY MOUTH EACH NIGHT AT BEDTIME (Patient taking differently: Take 100 mg by mouth at bedtime as needed (pain.). ) 30 capsule 4  . Homeopathic Products (THERAWORX RELIEF EX) Apply 1 application topically 3 (three) times daily as needed (pain.).    Marland Kitchen. insulin NPH Human (NOVOLIN N RELION) 100 UNIT/ML injection INJECT up to 100 units daily under skin (Patient taking differently: Inject 36 Units into the skin 2 (two) times daily. ) 30 mL 11  . liraglutide (VICTOZA) 18 MG/3ML SOPN INJECT 1.8MG  DAILY (Patient taking differently: Inject 1.2 mg into the skin daily. INJECT 1.8MG  DAILY) 27 mL 1  . Magnesium 250 MG TABS Take 250 mg by mouth daily.     . Menthol, Topical Analgesic, (ZIMS MAX-FREEZE EX) Apply 1 application topically 4 (four) times daily as needed (  pain).    . metFORMIN (GLUCOPHAGE-XR) 500 MG 24 hr tablet TAKE 1 TABLET BY MOUTH THREE TIMES DAILYWITH MEALS (Patient taking differently: Take 500 mg by mouth 3 (three) times daily with meals. Give w/food.) 180 tablet 5  . Misc Natural Products (OSTEO BI-FLEX JOINT SHIELD PO) Take 1 tablet by mouth daily.      .  nitroGLYCERIN (NITROSTAT) 0.4 MG SL tablet Place 1 tablet (0.4 mg total) under the tongue every 5 (five) minutes as needed for chest pain. 25 tablet 3  . NOVOLIN R RELION 100 UNIT/ML injection USE UP TO 100 UNITS A DAY (Patient taking differently: Inject 20-24 Units into the skin 3 (three) times daily with meals. Sliding scale insulin) 30 mL 0  . Omega-3 Fatty Acids (FISH OIL CONCENTRATE PO) Take 1 tablet by mouth daily.      . ondansetron (ZOFRAN) 4 MG tablet Take 4 mg by mouth every 8 (eight) hours as needed for nausea or vomiting.    Marland Kitchen Pumpkin Seed-Soy Germ (AZO BLADDER CONTROL/GO-LESS PO) Take 1 each by mouth 2 (two) times daily.     . ramipril (ALTACE) 10 MG capsule TAKE 1 CAPSULE BY MOUTH DAILY (Patient taking differently: Take 10 mg by mouth daily. ) 90 capsule 1  . ULTRATRAK PRO TEST test strip TEST BLOOD SUGAR 5 TIMES DAILY AS INSTRUCTED 450 each 1   No current facility-administered medications on file prior to visit.   Allergies  Allergen Reactions  . Lyrica [Pregabalin] Other (See Comments)    sleepy   Family History  Problem Relation Age of Onset  . Heart disease Mother   . Cancer Mother        ovarian  . Arthritis Mother   . Diabetes Mother   . Hypertension Mother   . Hypertension Father   . Stroke Father   . Diabetes Sister   . Cancer Unknown   . Coronary artery disease Unknown   . Diabetes Unknown     PE: BP 130/78   Pulse 82   Ht  (1.702 m)   Wt 282 lb (127.9 kg)   SpO2 98%   BMI 44.17 kg/m  Body mass index is 44.17 kg/m. Wt Readings from Last 3 Encounters:  09/21/19 282 lb (127.9 kg)  09/06/19 285 lb (129.3 kg)  08/29/19 285 lb (129.3 kg)   Constitutional: overweight, in NAD Eyes: PERRLA, EOMI, no exophthalmos ENT: moist mucous membranes, no thyromegaly, no cervical lymphadenopathy Cardiovascular: RRR, No MRG Respiratory: CTA B Gastrointestinal: abdomen soft, NT, ND, BS+ Musculoskeletal: no deformities, strength intact in all 4 Skin: moist,  warm, no rashes Neurological: no tremor with outstretched hands, DTR normal in all 4  ASSESSMENT: 1. DM2, insulin-dependent, uncontrolled, with complications - CAD - s/p stent 11/2004 - Dr Eden Emms - peripheral neuropathy She told me that her previous endocrinologist filled out FMLA forms 2/2 uncontrolled DM2, but I could not fill this since no major highs or lows or other issues.   2. PN  3. HL  4.  Obesity class III  PLAN:  1. DM2 Patient with longstanding, insulin-dependent type 2 diabetes, on basal-bolus insulin regimen with NPH and regular insulin, also, Metformin, and daily GLP-1 receptor agonist.  She is on a low dose of Victoza since she had constipation with higher doses.  She was even off Victoza for a period of time after she presented with nausea and vertigo to the emergency room.  However, the symptoms continued off of Victoza so we restarted it.  At last visit, she was in vestibular rehab due to persistent dizziness and vertigo.  Sugars were higher as she was also insulin for approximately a week.  HbA1c was higher at 7.3%.  At that time, sugars are mostly at goal before meals.  We did not change the regimen. - She now returns after long absence of more than a year.  - We reviewed together her need for ongoing meter download at this visit.  Sugars are variable, however, a 90-day average is 167, corresponding to an HbA1c of approximately 7.5%. -Sugars are lower in the morning of the increase throughout the day mostly after meals.  Therefore, at this visit, we will increase her regular insulin with meals, and I strongly advised her to try to inject this 30 minutes before the meal, since she is now taking it at different times before or after meals. -I also advised her to let me know after the first of the year if her insurance covers a weekly GLP-1 receptor agonist, which I think will help [place] more with postprandial blood sugar control. - I advised her to: Patient Instructions    Please continue: - Metformin XR 500 mg 3x a day - Victoza 1.2 mg daily  Breakfast  Lunch  Dinner bedtime  Insulin N: 36 units -  - 36 units  Insulin R: 22 units with a  smaller meal                 26 units wiith a  regular meal 22 units with a smaller meal 26 units with a larger meal 22 units with a smaller meal 26 units with a larger meal  -  For sugars: 71-90: take 16 units of R insulin 50-70: do not take R insulin  Please check with your insurance if Ozempic or Trulicity are covered.  Please return in 3-4 months with your sugar log.  - HbA1c today was : 8.0% (higher) - advised to check sugars at different times of the day - 3-4x a day, rotating check times - advised for yearly eye exams >> she is not UTD - return to clinic in 4 months    2. PN -Stable -She was previously on a higher dose of Neurontin, 300 mg daily, but she had somnolence with this.  She then decreased the dose to 100 mg daily.  She is taking this off and on now.  3. HL -Reviewed latest lipid panel from 06/2017: High triglycerides, LDL above goal and increased from before Lab Results  Component Value Date   CHOL 189 06/17/2017   HDL 37.40 (L) 06/17/2017   LDLCALC 93 01/09/2013   LDLDIRECT 109.0 06/17/2017   TRIG 302.0 (H) 06/17/2017   CHOLHDL 5 06/17/2017  -She was on pravastatin without side effects, now off, unclear why she. She had prev. MM aches from Lipitor. -She needs a new lipid panel today  4.  Obesity class III -She gained ~10 pounds since last visit -Continue GLP-1 receptor agonist which should also help with weight loss but I am hoping that we can switch to a more potent medication in this class after the first of the year  Component     Latest Ref Rng & Units 09/21/2019  Cholesterol     0 - 200 mg/dL 409  Triglycerides     0.0 - 149.0 mg/dL 811.9 (H)  HDL Cholesterol     >39.00 mg/dL 14.78  VLDL     0.0 - 29.5 mg/dL 62.1 (H)  Total CHOL/HDL  Ratio      4  NonHDL      132.18   Direct LDL     mg/dL 161.0   TG high and LDL higher than goal of <70 >> will recommend Crestor 10.  Carlus Pavlov, MD PhD [facility]

## 2019-09-21 NOTE — Patient Instructions (Addendum)
Please continue: - Metformin XR 500 mg 3x a day - Victoza 1.2 mg daily  Breakfast  Lunch  Dinner bedtime  Insulin N: 36 units -  - 36 units  Insulin R: 22 units with a  smaller meal                 26 units wiith a  regular meal 22 units with a smaller meal 26 units with a larger meal 22 units with a smaller meal 26 units with a larger meal  -  For sugars: 71-90: take 16 units of R insulin 50-70: do not take R insulin  Please check with your insurance if Ozempic or Trulicity are covered.  Please return in 3-4 months with your sugar log.

## 2019-09-21 NOTE — Addendum Note (Signed)
Addended by: Philemon Kingdom on: 09/21/2019 10:44 AM   Modules accepted: Orders

## 2019-09-21 NOTE — Addendum Note (Signed)
Addended by: Cardell Peach I on: 09/21/2019 11:45 AM   Modules accepted: Orders

## 2019-09-24 ENCOUNTER — Encounter: Payer: Self-pay | Admitting: Internal Medicine

## 2019-09-25 ENCOUNTER — Other Ambulatory Visit: Payer: Self-pay | Admitting: Internal Medicine

## 2019-09-25 MED ORDER — OZEMPIC (0.25 OR 0.5 MG/DOSE) 2 MG/1.5ML ~~LOC~~ SOPN: 0.5000 mg | PEN_INJECTOR | SUBCUTANEOUS | 5 refills | Status: DC

## 2019-09-27 ENCOUNTER — Encounter: Payer: Self-pay | Admitting: Physician Assistant

## 2019-09-27 NOTE — Progress Notes (Signed)
Cardiology Office Note    Date:  09/29/2019   ID:  Betty [name], DOB 11/17/1955, MRN [id number]  PCP:  Maryagnes Amos, FNP  Cardiologist:  Dr. Eden Emms   Chief Complaint: Hospital follow up  History of Present Illness:   Betty [name] is a 63 y.o. female CAD, HTN, DM, HLD with statin intolerance and OSA presents for follow up.   Hx of CAD s/p stenting to LAD in 2006. Previously followed by Dr. Gala Romney. Seen by neuro for dizziness Vertiginous symptoms CT/MRI head normal 04/11/18 ? Brainstem cerebellum stroke despite negative MRI started on plavix.Carotids no significant stenosis 08/25/18.  Recently has SSCP. Follow up stress test was abnormal. Cath showed severe subtotal occlusion of the mid-circumflex treated with PCI using a 2.25x22 mm Resolute Onyx DES. Patent LAD stent.   Here today for follow up.  She denies any recurrent chest pain.  She had a constipation prior to cath and started taking fiber supplement and laxative.  Then she developed the diarrhea now constipated again.  Denies orthopnea, PND, syncope, lower extremity edema or melena.  No exercise.  Intermittently felt sharp chest discomfort which consistent with PVC on EKG.  She has not started Crestor which prescribed by PCP.  Not taking Coreg as prescribed.   Past Medical History:  Diagnosis Date  . Arthritis   . CAD, NATIVE VESSEL    a. S/p LAD stent in 2006 b. S/p DES to Lcx 08/2019 and patent LAD stent  . DM (diabetes mellitus) (HCC)   . Fibromyalgia   . HYPERLIPIDEMIA-MIXED   . HYPERTENSION, BENIGN   . Neuropathy   . Obesity   . Osteoarthritis   . Sleep apnea    Patient reported on 04/12/13  . Vertigo     Past Surgical History:  Procedure Laterality Date  . BREATH TEK H PYLORI N/A 03/08/2013   Procedure: BREATH TEK H PYLORI;  Surgeon: Atilano Ina, MD;  Location: [facility];  Service: General;  Laterality: N/A;  . CESAREAN SECTION    . CHOLECYSTECTOMY  1996  . CORONARY ANGIOPLASTY  WITH STENT PLACEMENT  11/2004   DES - LAD  . CORONARY STENT INTERVENTION N/A 09/06/2019   Procedure: CORONARY STENT INTERVENTION;  Surgeon: Tonny Bollman, MD;  Location: [facility] INVASIVE CV LAB;  Service: Cardiovascular;  Laterality: N/A;  . CORONARY STENT PLACEMENT  11/2004  . RIGHT/LEFT HEART CATH AND CORONARY ANGIOGRAPHY N/A 09/06/2019   Procedure: RIGHT/LEFT HEART CATH AND CORONARY ANGIOGRAPHY;  Surgeon: Tonny Bollman, MD;  Location: [facility] INVASIVE CV LAB;  Service: Cardiovascular;  Laterality: N/A;    Current Medications: Prior to Admission medications   Medication Sig Start Date End Date Taking? Authorizing Provider  acetaminophen (TYLENOL ARTHRITIS PAIN) 650 MG CR tablet Take 650-1,300 mg by mouth every 8 (eight) hours as needed for pain.     [provider]  Aromatic Inhalants (VICKS VAPOINHALER IN) Place 1 Dose into both nostrils as needed (congestion.).    [provider]  aspirin EC 81 MG tablet Take 81 mg by mouth daily.    [provider]  BD PEN NEEDLE NANO U/F 32G X 4 MM MISC USE ONCE DAILY 08/25/19   Carlus Pavlov, MD  BD VEO INSULIN SYRINGE U/F 31G X 15/64" 1 ML MISC USE 1 NEEDLE 4 TIMES DAILY AS ADVISED 01/16/19   Carlus Pavlov, MD  beta carotene 50354 UNIT capsule Take 25,000 Units by mouth daily.      [provider]  Calcium Carbonate-Vitamin D (  CALCIUM-VITAMIN D) 600-200 MG-UNIT CAPS Take 1 tablet by mouth every evening.     [provider]  carvedilol (COREG) 3.125 MG tablet Take 1 tablet (3.125 mg total) by mouth 2 (two) times daily with a meal. 08/30/19   Wendall Stade, MD  cetirizine (ZYRTEC) 10 MG tablet Take 10 mg by mouth at bedtime.     [provider]  Cholecalciferol (VITAMIN D3) 250 MCG (10000 UT) capsule Take 10,000 Units by mouth daily.    [provider]  clopidogrel (PLAVIX) 75 MG tablet Take 1 tablet (75 mg total) by mouth daily. 09/06/19   Arty Baumgartner, NP  diphenhydrAMINE (BENADRYL)  25 mg capsule Take 25 mg by mouth every 6 (six) hours as needed for itching.    [provider]  gabapentin (NEURONTIN) 100 MG capsule TAKE 1 CAPSULE BY MOUTH EACH NIGHT AT BEDTIME Patient taking differently: Take 100 mg by mouth at bedtime as needed (pain.).  04/12/18   Carlus Pavlov, MD  Homeopathic Products [facility] RELIEF EX) Apply 1 application topically 3 (three) times daily as needed (pain.).    [provider]  insulin NPH Human (NOVOLIN N RELION) 100 UNIT/ML injection Inject 0.36 mLs (36 Units total) into the skin 2 (two) times daily. 09/21/19   Carlus Pavlov, MD  insulin regular (NOVOLIN R RELION) 100 units/mL injection Inject 0.22-0.26 mLs (22-26 Units total) into the skin 3 (three) times daily before meals. Sliding scale insulin 09/21/19   Carlus Pavlov, MD  liraglutide (VICTOZA) 18 MG/3ML SOPN Inject 0.2 mLs (1.2 mg total) into the skin daily. 09/21/19   Carlus Pavlov, MD  Magnesium 250 MG TABS Take 250 mg by mouth daily.     [provider]  Menthol, Topical Analgesic, (ZIMS MAX-FREEZE EX) Apply 1 application topically 4 (four) times daily as needed (pain).    [provider]  metFORMIN (GLUCOPHAGE-XR) 500 MG 24 hr tablet Take 1 tablet (500 mg total) by mouth 3 (three) times daily with meals. Give w/food. 09/21/19   Carlus Pavlov, MD  Misc Natural Products (OSTEO BI-FLEX JOINT SHIELD PO) Take 1 tablet by mouth daily.      [provider]  nitroGLYCERIN (NITROSTAT) 0.4 MG SL tablet Place 1 tablet (0.4 mg total) under the tongue every 5 (five) minutes as needed for chest pain. 08/24/19 11/22/19  Wendall Stade, MD  Omega-3 Fatty Acids (FISH OIL CONCENTRATE PO) Take 1 tablet by mouth daily.      [provider]  ondansetron (ZOFRAN) 4 MG tablet Take 4 mg by mouth every 8 (eight) hours as needed for nausea or vomiting.    [provider]  Pumpkin Seed-Soy Germ (AZO BLADDER CONTROL/GO-LESS PO) Take 1 each by  mouth 2 (two) times daily.     [provider]  ramipril (ALTACE) 10 MG capsule TAKE 1 CAPSULE BY MOUTH DAILY Patient taking differently: Take 10 mg by mouth daily.  02/06/19   Carlus Pavlov, MD  rosuvastatin (CRESTOR) 10 MG tablet Take 1 tablet (10 mg total) by mouth daily. 09/21/19   Carlus Pavlov, MD  Semaglutide,0.25 or 0.5MG /DOS, (OZEMPIC, 0.25 OR 0.5 MG/DOSE,) 2 MG/1.5ML SOPN Inject 0.5 mg into the skin once a week. 09/25/19   Carlus Pavlov, MD  Sherryl Barters PRO TEST test strip TEST BLOOD SUGAR 5 TIMES DAILY AS INSTRUCTED 10/10/18   Carlus Pavlov, MD    Allergies:   Lyrica [pregabalin]   Social History   Socioeconomic History  . Marital status: Divorced    Spouse  name: Not on file  . Number of children: 2  . Years of education: 56  . Highest education level: High school graduate  Occupational History  . 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 Smoking status: Never Smoker  . Smokeless tobacco: Never Used  Substance and Sexual Activity  . Alcohol use: No  . Drug use: No  . Sexual activity: Not on file  Other Topics Concern  . Not on file  Social History Narrative   Lives alone.   Right-handed.   Caffeine use:  She will sometimes drink up to three 20-oz bottles of soda per day.  Drinks 0.5 bottles of a 5-hour energy drink daily.   Social Determinants of Health   Financial Resource Strain:   . Difficulty of Paying Living Expenses: Not on file  Food Insecurity:   . Worried About Charity fundraiser in the Last Year: Not on file  . Ran Out of Food in the Last Year: Not on file  Transportation Needs:   . Lack of Transportation (Medical): Not on file  . Lack of Transportation (Non-Medical): Not on file  Physical Activity:   . Days of Exercise per Week: Not on file  . Minutes of Exercise per Session: Not on file  Stress:   . Feeling of Stress : Not on file  Social Connections:   . Frequency of Communication with Friends and Family: Not on  file  . Frequency of Social Gatherings with Friends and Family: Not on file  . Attends Religious Services: Not on file  . Active Member of Clubs or Organizations: Not on file  . Attends Archivist Meetings: Not on file  . Marital Status: Not on file     Family History:  The patient's family history includes Arthritis in her mother; Cancer in her mother and unknown relative; Coronary artery disease in her unknown relative; Diabetes in her mother, sister, and unknown relative; Heart disease in her mother; Hypertension in her father and mother; Stroke in her father.   ROS:   Please see the history of present illness.    ROS All other systems reviewed and are negative.   PHYSICAL EXAM:   VS:  BP (!) 160/70   Pulse 94   Ht 5\' 7"  (1.702 m)   Wt 285 lb (129.3 kg)   SpO2 96%   BMI 44.64 kg/m    GEN: Well nourished, well developed, in no acute distress  HEENT: normal  Neck: no JVD, carotid bruits, or masses Cardiac: RRR; no murmurs, rubs, or gallops,no edema  Respiratory:  clear to auscultation bilaterally, normal work of breathing GI: soft, nontender, nondistended, + BS MS: no deformity or atrophy  Skin: warm and dry, no rash Neuro:  Alert and Oriented x 3, Strength and sensation are intact Psych: euthymic mood, full affect  Wt Readings from Last 3 Encounters:  09/29/19 285 lb (129.3 kg)  09/21/19 282 lb (127.9 kg)  09/06/19 285 lb (129.3 kg)      Studies/Labs Reviewed:   EKG:  EKG is ordered today.  The ekg ordered today demonstrates sinus rhythm at rate of 94 bpm, quadrigeminy, PVC  Recent Labs: 09/01/2019: BUN 13; Creatinine, Ser 0.75; Platelets 252 09/06/2019: Hemoglobin 11.6; Hemoglobin 11.6; Potassium 4.2; Potassium 4.3; Sodium 140; Sodium 139   Lipid Panel    Component Value Date/Time   CHOL 175 09/21/2019 0905   TRIG 282.0 (H) 09/21/2019 0905   HDL 42.90 09/21/2019 0905   CHOLHDL 4  09/21/2019 0905   VLDL 56.4 (H) 09/21/2019 0905   LDLCALC 93  01/09/2013 0936   LDLDIRECT 108.0 09/21/2019 0905    Additional studies/ records that were reviewed today include:   CORONARY STENT INTERVENTION  08/2019  RIGHT/LEFT HEART CATH AND CORONARY ANGIOGRAPHY  Conclusion  1. Continued patency of the LAD stent 2. Patent RCA 3. Severe subtotal occlusion of the mid-circumflex treated with PCI using a 2.25x22 mm Resolute Onyx DES 4. Essentially normal right heart pressures and preserved cardiac output  Recommend: same day discharge if no complications arise. Follow-up Dr Eden Emms. ASA/plavix without interruption x 6 months.    Diagnostic Dominance: Right  Intervention      ASSESSMENT & PLAN:    1. CAD  Recent cath showed patent stent and DES to Lcx.  No recurrent chest pain.  Continue aspirin, Plavix, beta-blocker.  Discussed compliance in detail.  2. HLD - 09/21/2019: Cholesterol 175; HDL 42.90; Triglycerides 282.0; VLDL 56.4, LDL 108 - LDL goal less than 70 - Followed by PCP>> she has history of statin intolerance in the past.  Recently started on Crestor 10 mg however she is hesitant to start.  Will refer to lipid clinic.  3. HTN -Elevated today at 160/70.  She is not taking Coreg as prescribed.  She has not took her blood pressure medication this morning.  Advised to take Coreg and ramipril as prescribed.  Keep log.  She will call us if blood pressure persistently above 140/90.  4. DM - Hemoglobin A1c 8 - Per PCP  5.  PVC -Somewhat symptomatic.  Check potassium and magnesium.  Discussed compliance with Coreg.  6.  Constipation/diarrhea -Advised to follow-up with PCP if no improvement.    Medication Adjustments/Labs and Tests Ordered: Current medicines are reviewed at length with the patient today.  Concerns regarding medicines are outlined above.  Medication changes, Labs and Tests ordered today are listed in the Patient Instructions below. Patient Instructions  Medication Instructions:  Your physician recommends that  you continue on your current medications as directed. Please refer to the Current Medication list given to you today.  *If you need a refill on your cardiac medications before your next appointment, please call your pharmacy*  Lab Work: TODAY:  BMET & MAG  If you have labs (blood work) drawn today and your tests are completely normal, you will receive your results only by: Marland Kitchen MyChart Message (if you have MyChart) OR . A paper copy in the mail If you have any lab test that is abnormal or we need to change your treatment, we will call you to review the results.  Testing/Procedures: None ordered  You have been referred to Our Lipid Clinic Department, come in person 10/16/2019 at 9:445 for registration  Follow-Up: At [facility], you and your health needs are our priority.  As part of our continuing mission to provide you with exceptional heart care, we have created designated Provider Care Teams.  These Care Teams include your primary Cardiologist (physician) and Advanced Practice Providers (APPs -  Physician Assistants and Nurse Practitioners) who all work together to provide you with the care you need, when you need it.  Your next appointment:   5 month(s) (You already have a recall put in.  You will receive a letter in the mail when it is time to call and schedule, or you can call in March to schedule for May 2021 if you think about it)  The format for your next appointment:   In Person  Provider:   You may see Charlton HawsPeter Nishan, MD or one of the following Advanced Practice Providers on your designated Care Team:    Norma FredricksonLori Gerhardt, NP  Nada BoozerLaura Ingold, NP  Georgie ChardJill McDaniel, NP   Other Instructions      Signed, Manson PasseyBhavinkumar Khloey Chern, [place]  09/29/2019 10:13 AM    [facility]  [facility] [address], [place], [place]  [address] Phone: [phone]) [phone]; Fax: [phone]) [phone]

## 2019-09-29 ENCOUNTER — Other Ambulatory Visit: Payer: Self-pay

## 2019-09-29 ENCOUNTER — Encounter: Payer: Self-pay | Admitting: Physician Assistant

## 2019-09-29 ENCOUNTER — Ambulatory Visit: Payer: Federal, State, Local not specified - PPO | Admitting: Physician Assistant

## 2019-09-29 VITALS — BP 160/70 | HR 94 | Ht 67.0 in | Wt 285.0 lb

## 2019-09-29 DIAGNOSIS — I10 Essential (primary) hypertension: Secondary | ICD-10-CM | POA: Diagnosis not present

## 2019-09-29 DIAGNOSIS — R197 Diarrhea, unspecified: Secondary | ICD-10-CM | POA: Diagnosis not present

## 2019-09-29 DIAGNOSIS — E119 Type 2 diabetes mellitus without complications: Secondary | ICD-10-CM

## 2019-09-29 DIAGNOSIS — E785 Hyperlipidemia, unspecified: Secondary | ICD-10-CM | POA: Diagnosis not present

## 2019-09-29 DIAGNOSIS — I493 Ventricular premature depolarization: Secondary | ICD-10-CM

## 2019-09-29 DIAGNOSIS — Z794 Long term (current) use of insulin: Secondary | ICD-10-CM

## 2019-09-29 DIAGNOSIS — I25110 Atherosclerotic heart disease of native coronary artery with unstable angina pectoris: Secondary | ICD-10-CM

## 2019-09-29 LAB — BASIC METABOLIC PANEL
BUN/Creatinine Ratio: 22 (ref 12–28)
BUN: 15 mg/dL (ref 8–27)
CO2: 22 mmol/L (ref 20–29)
Calcium: 9.5 mg/dL (ref 8.7–10.3)
Chloride: 100 mmol/L (ref 96–106)
Creatinine, Ser: 0.69 mg/dL (ref 0.57–1.00)
GFR calc Af Amer: 106 mL/min/{1.73_m2} (ref >59)
GFR calc non Af Amer: 92 mL/min/{1.73_m2} (ref >59)
Glucose: 263 mg/dL — ABNORMAL HIGH (ref 65–99)
Potassium: 4.4 mmol/L (ref 3.5–5.2)
Sodium: 138 mmol/L (ref 134–144)

## 2019-09-29 LAB — MAGNESIUM: Magnesium: 1.6 mg/dL (ref 1.6–2.3)

## 2019-09-29 NOTE — Patient Instructions (Addendum)
Medication Instructions:  Your physician recommends that you continue on your current medications as directed. Please refer to the Current Medication list given to you today.  *If you need a refill on your cardiac medications before your next appointment, please call your pharmacy*  Lab Work: TODAY:  BMET & MAG  If you have labs (blood work) drawn today and your tests are completely normal, you will receive your results only by: Marland Kitchen MyChart Message (if you have MyChart) OR . A paper copy in the mail If you have any lab test that is abnormal or we need to change your treatment, we will call you to review the results.  Testing/Procedures: None ordered  You have been referred to [facility] Department, come in person 10/16/2019 at 9:445 for registration  Follow-Up: At [facility], you and your health needs are our priority.  As part of our continuing mission to provide you with exceptional heart care, we have created designated Provider Care Teams.  These Care Teams include your primary Cardiologist (physician) and Advanced Practice Providers (APPs -  Physician Assistants and Nurse Practitioners) who all work together to provide you with the care you need, when you need it.  Your next appointment:   5 month(s) (You already have a recall put in.  You will receive a letter in the mail when it is time to call and schedule, or you can call in March to schedule for May 2021 if you think about it)  The format for your next appointment:   In Person  Provider:   You may see Jenkins Rouge, MD or one of the following Advanced Practice Providers on your designated Care Team:    Truitt Merle, NP  Cecilie Kicks, NP  Kathyrn Drown, NP   Other Instructions

## 2019-10-02 NOTE — Progress Notes (Signed)
Pt has been made aware of normal result and verbalized understanding.  jw 12/21

## 2019-10-04 ENCOUNTER — Other Ambulatory Visit: Payer: Self-pay | Admitting: Internal Medicine

## 2019-10-04 NOTE — Telephone Encounter (Signed)
Ok to fill 

## 2019-10-04 NOTE — Telephone Encounter (Signed)
OK to refill

## 2019-10-16 ENCOUNTER — Ambulatory Visit: Payer: Federal, State, Local not specified - PPO

## 2019-10-16 NOTE — Progress Notes (Deleted)
Patient ID: Betty [name]                 DOB: [birth year]                    MRN: [id number]     HPI: Betty [name] is a 64 y.o. female patient referred to lipid clinic by [facility]. PMH is significant for CAD s/p stenting to LAD in 2006 and PCI in 2020, HTN, DM, HLD with statin intolerance,OSA and stroke.   Address diet- blood sugar control Try to get on atleast low dose statin Labs on meds?  Current Medications: none (maybe rosuvastatin 10mg ) Intolerances: pravastatin 40 mg Risk Factors: DM, recurrent CAD, stroke, HTN LDL goal: <55  Diet:   Exercise:   Family History: The patient's family history includes Arthritis in her mother; Cancer in her mother and unknown relative; Coronary artery disease in her unknown relative; Diabetes in her mother, sister, and unknown relative; Heart disease in her mother; Hypertension in her father and mother; Stroke in her father.   Social History:   Labs: 09/21/2019 TC 175 TG 282 HDL 42 LDL D 108 (no meds)  Past Medical History:  Diagnosis Date  . Arthritis   . CAD, NATIVE VESSEL    a. S/p LAD stent in 2006 b. S/p DES to Lcx 08/2019 and patent LAD stent  . DM (diabetes mellitus) (HCC)   . Fibromyalgia   . HYPERLIPIDEMIA-MIXED   . HYPERTENSION, BENIGN   . Neuropathy   . Obesity   . Osteoarthritis   . Sleep apnea    Patient reported on 04/12/13  . Vertigo     Current Outpatient Medications on File Prior to Visit  Medication Sig Dispense Refill  . acetaminophen (TYLENOL ARTHRITIS PAIN) 650 MG CR tablet Take 650-1,300 mg by mouth every 8 (eight) hours as needed for pain.     . Aromatic Inhalants (VICKS VAPOINHALER IN) Place 1 Dose into both nostrils as needed (congestion.).    06/13/13 aspirin EC 81 MG tablet Take 81 mg by mouth daily.    . BD PEN NEEDLE NANO U/F 32G X 4 MM MISC USE ONCE DAILY 100 each 11  . BD VEO INSULIN SYRINGE U/F 31G X 15/64" 1 ML MISC USE 1 NEEDLE 4 TIMES DAILY AS ADVISED 300 each 3  . beta carotene 07-22-2000 UNIT  capsule Take 25,000 Units by mouth daily.      . Calcium Carbonate-Vitamin D (CALCIUM-VITAMIN D) 600-200 MG-UNIT CAPS Take 1 tablet by mouth every evening.     . carvedilol (COREG) 3.125 MG tablet Take 1 tablet (3.125 mg total) by mouth 2 (two) times daily with a meal. 180 tablet 3  . cetirizine (ZYRTEC) 10 MG tablet Take 10 mg by mouth at bedtime.     . Cholecalciferol (VITAMIN D3) 250 MCG (10000 UT) capsule Take 10,000 Units by mouth daily.    . clopidogrel (PLAVIX) 75 MG tablet Take 1 tablet (75 mg total) by mouth daily. 30 tablet 0  . diphenhydrAMINE (BENADRYL) 25 mg capsule Take 25 mg by mouth every 6 (six) hours as needed for itching.    . gabapentin (NEURONTIN) 100 MG capsule TAKE 1 CAPSULE BY MOUTH EACH NIGHT AT BEDTIME 30 capsule 4  . Homeopathic Products (THERAWORX RELIEF EX) Apply 1 application topically 3 (three) times daily as needed (pain.).    78295 insulin NPH Human (NOVOLIN N RELION) 100 UNIT/ML injection Inject 0.36 mLs (36 Units total) into the skin 2 (two) times  daily. 30 mL 11  . insulin regular (NOVOLIN R RELION) 100 units/mL injection Inject 0.22-0.26 mLs (22-26 Units total) into the skin 3 (three) times daily before meals. Sliding scale insulin 30 mL 11  . liraglutide (VICTOZA) 18 MG/3ML SOPN Inject 0.2 mLs (1.2 mg total) into the skin daily. 9 pen 3  . Magnesium 250 MG TABS Take 250 mg by mouth daily.     . meloxicam (MOBIC) 7.5 MG tablet Take 7.5 mg by mouth daily.    . Menthol, Topical Analgesic, (ZIMS MAX-FREEZE EX) Apply 1 application topically 4 (four) times daily as needed (pain).    . metFORMIN (GLUCOPHAGE-XR) 500 MG 24 hr tablet Take 1 tablet (500 mg total) by mouth 3 (three) times daily with meals. Give w/food. 270 tablet 3  . Misc Natural Products (OSTEO BI-FLEX JOINT SHIELD PO) Take 1 tablet by mouth daily.      . nitroGLYCERIN (NITROSTAT) 0.4 MG SL tablet Place 1 tablet (0.4 mg total) under the tongue every 5 (five) minutes as needed for chest pain. 25 tablet 3  .  Omega-3 Fatty Acids (FISH OIL CONCENTRATE PO) Take 1 tablet by mouth daily.      . ondansetron (ZOFRAN) 4 MG tablet Take 4 mg by mouth every 8 (eight) hours as needed for nausea or vomiting.    Marland Kitchen Pumpkin Seed-Soy Germ (AZO BLADDER CONTROL/GO-LESS PO) Take 1 each by mouth 2 (two) times daily.     . ramipril (ALTACE) 10 MG capsule TAKE 1 CAPSULE BY MOUTH DAILY 90 capsule 1  . rosuvastatin (CRESTOR) 10 MG tablet Take 1 tablet (10 mg total) by mouth daily. 90 tablet 3  . Semaglutide,0.25 or 0.5MG /DOS, (OZEMPIC, 0.25 OR 0.5 MG/DOSE,) 2 MG/1.5ML SOPN Inject 0.5 mg into the skin once a week. 3 pen 5  . ULTRATRAK PRO TEST test strip TEST BLOOD SUGAR 5 TIMES DAILY AS INSTRUCTED 450 each 1   No current facility-administered medications on file prior to visit.    Allergies  Allergen Reactions  . Lyrica [Pregabalin] Other (See Comments)    sleepy    Assessment/Plan:  1. Hyperlipidemia -    Thank you,  Ramond Dial, Pharm.D, BCPS, CPP [facility]  [address] [place], [place] [address]  Phone: [phone]; Fax: [phone]

## 2019-10-19 ENCOUNTER — Other Ambulatory Visit: Payer: Self-pay

## 2019-10-19 ENCOUNTER — Encounter: Payer: Self-pay | Admitting: Pharmacist

## 2019-10-19 ENCOUNTER — Ambulatory Visit (INDEPENDENT_AMBULATORY_CARE_PROVIDER_SITE_OTHER): Payer: Federal, State, Local not specified - PPO | Admitting: Pharmacist

## 2019-10-19 DIAGNOSIS — E785 Hyperlipidemia, unspecified: Secondary | ICD-10-CM | POA: Diagnosis not present

## 2019-10-19 MED ORDER — ICOSAPENT ETHYL 1 G PO CAPS: 2.0000 g | ORAL_CAPSULE | Freq: Two times a day (BID) | ORAL | 11 refills | Status: DC

## 2019-10-19 NOTE — Progress Notes (Signed)
Patient ID: DELILIAH SPRANGER                 DOB: 1956-08-07                    MRN: [id number]     HPI: Betty [name] is a 64 y.o. female patient of Dr. Eden Emms referred to lipid clinic by Manson Passey, PA. PMH is significant for CAD s/p stent in 2006 and then again in 2020, HTN, DM, HLD with statin intolerance, OSA, fibromyalgia, obesity, and osteoarthirtis. Patient  Crestor (rosuvastatin) 10 mg daily was prescribed by PCP 09/21/2019. Patient reported muscle aches, memory issues and high blood sugar with rosuvastatin. Dr. Elvera Lennox encouraged her to continue taking because of its cardiovascular benefits. Patient was seen by [facility] on 09/24/2019 and reported she was not taking crestor. She was referred to lipid clinic.  Patient here today for follow-up on lipid management. Patient does report some problems with memory, Jake Samples, patient's daughter, was called and on speaker for this appointment. Daughter manages the patient's mediations. Daughter confirms that patient is taking rosuvastatin 10 mg. Patient has muscle cramps in both legs but not as bad as with atorvastatin. Mentions peripheral neuropathy and arthritis could also contribute to leg pain. Patient started taking OTC Omega-3 fatty acids after first stent placement in 2006, but then stopped a few years later.   Current Medications: rosuvastatin 10 mg once daily  Intolerances: pravastatin 40 mg (pt unsure why she stopped), cholestyramine 4 g packets, atorvastatin (muscle cramps)  Risk Factors: CAD, HTN, DM, obesity  LDL goal:  < 55 mg/dL  Diet: not discussed in detail  Exercise: No exercise  Family History: Mother- arthritis, cancer, heart disease, HTN; Father- HTN, stroke; CAD in unknown relative; Sister, Mother, and unknown relative- DM  Social History: No tobacco, alcohol, or drug use  Labs: Lipid panel (09/21/2019): TC 175, TG 282, HDL 42.9, LDL 108 (no medications) BMET: normal except glucose 263 Mag  1.6  Past Medical History:  Diagnosis Date  . Arthritis   . CAD, NATIVE VESSEL    a. S/p LAD stent in 2006 b. S/p DES to Lcx 08/2019 and patent LAD stent  . DM (diabetes mellitus) (HCC)   . Fibromyalgia   . HYPERLIPIDEMIA-MIXED   . HYPERTENSION, BENIGN   . Neuropathy   . Obesity   . Osteoarthritis   . Sleep apnea    Patient reported on 04/12/13  . Vertigo     Current Outpatient Medications on File Prior to Visit  Medication Sig Dispense Refill  . acetaminophen (TYLENOL ARTHRITIS PAIN) 650 MG CR tablet Take 650-1,300 mg by mouth every 8 (eight) hours as needed for pain.     . Aromatic Inhalants (VICKS VAPOINHALER IN) Place 1 Dose into both nostrils as needed (congestion.).    Marland Kitchen aspirin EC 81 MG tablet Take 81 mg by mouth daily.    . BD PEN NEEDLE NANO U/F 32G X 4 MM MISC USE ONCE DAILY 100 each 11  . BD VEO INSULIN SYRINGE U/F 31G X 15/64" 1 ML MISC USE 1 NEEDLE 4 TIMES DAILY AS ADVISED 300 each 3  . beta carotene 97673 UNIT capsule Take 25,000 Units by mouth daily.      . Calcium Carbonate-Vitamin D (CALCIUM-VITAMIN D) 600-200 MG-UNIT CAPS Take 1 tablet by mouth every evening.     . carvedilol (COREG) 3.125 MG tablet Take 1 tablet (3.125 mg total) by mouth 2 (two) times daily with a meal. 180 tablet  3  . cetirizine (ZYRTEC) 10 MG tablet Take 10 mg by mouth at bedtime.     . Cholecalciferol (VITAMIN D3) 250 MCG (10000 UT) capsule Take 10,000 Units by mouth daily.    . clopidogrel (PLAVIX) 75 MG tablet Take 1 tablet (75 mg total) by mouth daily. 30 tablet 0  . diphenhydrAMINE (BENADRYL) 25 mg capsule Take 25 mg by mouth every 6 (six) hours as needed for itching.    . gabapentin (NEURONTIN) 100 MG capsule TAKE 1 CAPSULE BY MOUTH EACH NIGHT AT BEDTIME 30 capsule 4  . Homeopathic Products (THERAWORX RELIEF EX) Apply 1 application topically 3 (three) times daily as needed (pain.).    Marland Kitchen insulin NPH Human (NOVOLIN N RELION) 100 UNIT/ML injection Inject 0.36 mLs (36 Units total) into the skin  2 (two) times daily. 30 mL 11  . insulin regular (NOVOLIN R RELION) 100 units/mL injection Inject 0.22-0.26 mLs (22-26 Units total) into the skin 3 (three) times daily before meals. Sliding scale insulin 30 mL 11  . liraglutide (VICTOZA) 18 MG/3ML SOPN Inject 0.2 mLs (1.2 mg total) into the skin daily. 9 pen 3  . Magnesium 250 MG TABS Take 250 mg by mouth daily.     . meloxicam (MOBIC) 7.5 MG tablet Take 7.5 mg by mouth daily.    . Menthol, Topical Analgesic, (ZIMS MAX-FREEZE EX) Apply 1 application topically 4 (four) times daily as needed (pain).    . metFORMIN (GLUCOPHAGE-XR) 500 MG 24 hr tablet Take 1 tablet (500 mg total) by mouth 3 (three) times daily with meals. Give w/food. 270 tablet 3  . Misc Natural Products (OSTEO BI-FLEX JOINT SHIELD PO) Take 1 tablet by mouth daily.      . nitroGLYCERIN (NITROSTAT) 0.4 MG SL tablet Place 1 tablet (0.4 mg total) under the tongue every 5 (five) minutes as needed for chest pain. 25 tablet 3  . Omega-3 Fatty Acids (FISH OIL CONCENTRATE PO) Take 1 tablet by mouth daily.      . ondansetron (ZOFRAN) 4 MG tablet Take 4 mg by mouth every 8 (eight) hours as needed for nausea or vomiting.    Marland Kitchen Pumpkin Seed-Soy Germ (AZO BLADDER CONTROL/GO-LESS PO) Take 1 each by mouth 2 (two) times daily.     . ramipril (ALTACE) 10 MG capsule TAKE 1 CAPSULE BY MOUTH DAILY 90 capsule 1  . rosuvastatin (CRESTOR) 10 MG tablet Take 1 tablet (10 mg total) by mouth daily. 90 tablet 3  . Semaglutide,0.25 or 0.5MG /DOS, (OZEMPIC, 0.25 OR 0.5 MG/DOSE,) 2 MG/1.5ML SOPN Inject 0.5 mg into the skin once a week. 3 pen 5  . ULTRATRAK PRO TEST test strip TEST BLOOD SUGAR 5 TIMES DAILY AS INSTRUCTED 450 each 1   No current facility-administered medications on file prior to visit.    Allergies  Allergen Reactions  . Lyrica [Pregabalin] Other (See Comments)    sleepy    Assessment/Plan:  1. Hyperlipidemia - LDL is above goal of < 55 mg/dL and TG are above goal of < 150 mg/dL. Start  Vascepa (icosapant ethyl) 2 grams twice daily. Educated patient on cardiovascular benefits taking Vascepa with a statin. Discussed increasing rosuvastatin to 20mg  but patient was concerned about muscle cramps and her daughter would be out of town for a month. Will continue taking rosuvastatin 10 mg once daily for now and will recheck LDL/lipid panel/lft in 1 month.  If LDL is elevated at recheck will need to consider either statin dose increase, zetia or PCSK9i. Patient also educated  on the eat foods low in carbs and sugars to lower triglycerides and improve blood sugar control. Handouts provided to patient.  Paulette Blanch, PharmD Candidate  Olene Floss, Pharm.D, BCPS, CPP [facility]  [address], [place], [place] [address]  Phone: [phone]; Fax: [phone]

## 2019-10-19 NOTE — Patient Instructions (Addendum)
It was pleasure to meet you!  Please continue rosuvastatin 10mg  daily. Start taking Icosapent Ethyl (VASCEPA) 2 capsules twice a day.  We will recheck labs on 12/03/19  Call 12/05/19 at [phone] with any questions or concerns.

## 2019-10-30 ENCOUNTER — Other Ambulatory Visit: Payer: Self-pay | Admitting: Cardiovascular Disease

## 2019-11-10 ENCOUNTER — Ambulatory Visit: Payer: Federal, State, Local not specified - PPO | Admitting: Internal Medicine

## 2019-11-16 ENCOUNTER — Other Ambulatory Visit: Payer: Self-pay | Admitting: Internal Medicine

## 2019-11-17 ENCOUNTER — Other Ambulatory Visit: Payer: Self-pay | Admitting: Internal Medicine

## 2019-12-04 ENCOUNTER — Telehealth: Payer: Self-pay | Admitting: *Deleted

## 2019-12-04 ENCOUNTER — Other Ambulatory Visit: Payer: Federal, State, Local not specified - PPO

## 2019-12-04 NOTE — Telephone Encounter (Signed)
   Primary Cardiologist: Charlton Haws, MD  Chart reviewed as part of pre-operative protocol coverage. Patient was contacted 12/04/2019 in reference to pre-operative risk assessment for pending surgery as outlined below.  Betty [name] had new stent to coronary artery 09/06/19 .  Since that day, Betty [name] has done well BUT she should not stop Asprin or plavix until after 03/05/20.      Betty [name] will require a follow-up visit for further pre-operative risk assessment.  Prior to 03/05/20.  Please note if procedure must be done prior to that time, please contact our office.  Pre-op covering staff: - Please schedule appointment with Dr. Eden Emms the end of April or first of MAY  and call patient to inform them. - Please contact requesting surgeon's office via preferred method (i.e, phone, fax) to inform them of need for appointment prior to surgery.  [name], NP 12/04/2019, 5:02 PM

## 2019-12-04 NOTE — Telephone Encounter (Signed)
   [facility] Pre-operative Risk Assessment    Request for surgical clearance:  1. What type of surgery is being performed? DENTAL CLEANING, X-RAY,FILLINGS, CROWNS BRIDGE WORK  2. When is this surgery scheduled? TBD   3. What type of clearance is required (medical clearance vs. Pharmacy clearance to hold med vs. Both)? MEDICAL  4. Are there any medications that need to be held prior to surgery and how long? PLAVIX    5. Practice name and name of physician performing surgery? [facility]; DR. [place] THOMAS   6. What is your office phone number [phone]    7.   What is your office fax number [phone]  8.   Anesthesia type (None, local, MAC, general) ? LOCAL   Julaine Hua 12/04/2019, 3:47 PM  _________________________________________________________________   (provider comments below)

## 2019-12-05 NOTE — Telephone Encounter (Signed)
I left a message for the Betty [name] to call back and schedule a pre op clearance appt with Dr. Eden Emms for the end of April 1st week of May, see notes on pre op clearance from [name], NP.

## 2019-12-07 NOTE — Telephone Encounter (Signed)
Left message x 2 to schedule appt with Dr. Eden Emms for pre op clearance.

## 2019-12-11 ENCOUNTER — Other Ambulatory Visit: Payer: Self-pay

## 2019-12-11 ENCOUNTER — Other Ambulatory Visit: Payer: Federal, State, Local not specified - PPO | Admitting: *Deleted

## 2019-12-11 DIAGNOSIS — E785 Hyperlipidemia, unspecified: Secondary | ICD-10-CM | POA: Diagnosis not present

## 2019-12-11 LAB — LIPID PANEL
Chol/HDL Ratio: 2.5 ratio (ref 0.0–4.4)
Cholesterol, Total: 107 mg/dL (ref 100–199)
HDL: 43 mg/dL (ref >39)
LDL Chol Calc (NIH): 41 mg/dL (ref 0–99)
Triglycerides: 131 mg/dL (ref 0–149)
VLDL Cholesterol Cal: 23 mg/dL (ref 5–40)

## 2019-12-11 LAB — HEPATIC FUNCTION PANEL
ALT: 26 IU/L (ref 0–32)
AST: 33 IU/L (ref 0–40)
Albumin: 3.8 g/dL (ref 3.8–4.8)
Alkaline Phosphatase: 82 IU/L (ref 39–117)
Bilirubin Total: 0.5 mg/dL (ref 0.0–1.2)
Bilirubin, Direct: 0.15 mg/dL (ref 0.00–0.40)
Total Protein: 6.4 g/dL (ref 6.0–8.5)

## 2019-12-11 LAB — LDL CHOLESTEROL, DIRECT: LDL Direct: 48 mg/dL (ref 0–99)

## 2019-12-11 NOTE — Telephone Encounter (Signed)
Tried to call pt to schedule an appt -VM is full will call [facility] lab-pt has appt today will have them direct pt to go to checkout to schedule appt for cardiac clearance

## 2019-12-12 NOTE — Telephone Encounter (Signed)
Pt has been scheduled to see Dr. Eden Emms 03/01/20 @ 3 pm for pre op clearance. I will forward clearance note to Dr. Eden Emms as well as to Dr. [place] Thomas. I will remove from the pre op call back pool.

## 2019-12-27 ENCOUNTER — Encounter: Payer: Self-pay | Admitting: Internal Medicine

## 2020-01-01 ENCOUNTER — Other Ambulatory Visit: Payer: Self-pay | Admitting: Internal Medicine

## 2020-01-18 ENCOUNTER — Ambulatory Visit: Payer: Federal, State, Local not specified - PPO | Admitting: Internal Medicine

## 2020-02-07 DIAGNOSIS — Z03818 Encounter for observation for suspected exposure to other biological agents ruled out: Secondary | ICD-10-CM | POA: Diagnosis not present

## 2020-02-07 DIAGNOSIS — Z20828 Contact with and (suspected) exposure to other viral communicable diseases: Secondary | ICD-10-CM | POA: Diagnosis not present

## 2020-02-09 DIAGNOSIS — Z03818 Encounter for observation for suspected exposure to other biological agents ruled out: Secondary | ICD-10-CM | POA: Diagnosis not present

## 2020-02-09 DIAGNOSIS — Z20828 Contact with and (suspected) exposure to other viral communicable diseases: Secondary | ICD-10-CM | POA: Diagnosis not present

## 2020-02-11 DIAGNOSIS — Z20828 Contact with and (suspected) exposure to other viral communicable diseases: Secondary | ICD-10-CM | POA: Diagnosis not present

## 2020-02-11 DIAGNOSIS — Z03818 Encounter for observation for suspected exposure to other biological agents ruled out: Secondary | ICD-10-CM | POA: Diagnosis not present

## 2020-02-11 DIAGNOSIS — Z20822 Contact with and (suspected) exposure to covid-19: Secondary | ICD-10-CM | POA: Diagnosis not present

## 2020-02-15 ENCOUNTER — Ambulatory Visit: Payer: Federal, State, Local not specified - PPO | Admitting: Internal Medicine
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
## 2020-03-01 ENCOUNTER — Encounter: Payer: Self-pay | Admitting: Cardiovascular Disease

## 2020-03-01 ENCOUNTER — Ambulatory Visit (INDEPENDENT_AMBULATORY_CARE_PROVIDER_SITE_OTHER): Payer: Federal, State, Local not specified - PPO | Admitting: Cardiovascular Disease

## 2020-03-01 ENCOUNTER — Other Ambulatory Visit: Payer: Self-pay

## 2020-03-01 VITALS — BP 142/90 | HR 86 | Ht 66.0 in | Wt 281.0 lb

## 2020-03-01 DIAGNOSIS — I2510 Atherosclerotic heart disease of native coronary artery without angina pectoris: Secondary | ICD-10-CM | POA: Diagnosis not present

## 2020-03-01 NOTE — Patient Instructions (Addendum)
Medication Instructions:  *If you need a refill on your cardiac medications before your next appointment, please call your pharmacy*  Lab Work: If you have labs (blood work) drawn today and your tests are completely normal, you will receive your results only by: Marland Kitchen MyChart Message (if you have MyChart) OR . A paper copy in the mail If you have any lab test that is abnormal or we need to change your treatment, we will call you to review the results.  Testing/Procedures: None ordered today.   Follow-Up: At [facility], you and your health needs are our priority.  As part of our continuing mission to provide you with exceptional heart care, we have created designated Provider Care Teams.  These Care Teams include your primary Cardiologist (physician) and Advanced Practice Providers (APPs -  Physician Assistants and Nurse Practitioners) who all work together to provide you with the care you need, when you need it.  We recommend signing up for the patient portal called "MyChart".  Sign up information is provided on this After Visit Summary.  MyChart is used to connect with patients for Virtual Visits (Telemedicine).  Patients are able to view lab/test results, encounter notes, upcoming appointments, etc.  Non-urgent messages can be sent to your provider as well.   To learn more about what you can do with MyChart, go to [email or web address].    Your next appointment:   6 month(s)  The format for your next appointment:   In Person  Provider:   You may see Charlton Haws, MD or one of the following Advanced Practice Providers on your designated Care Team:    Norma Fredrickson, NP  [name], NP  Georgie Chard, NP  Will send Office Visit Note to Dr. Maisie Fus to let him know you can hold your Plavix 5 days prior to procedure.

## 2020-03-31 ENCOUNTER — Other Ambulatory Visit: Payer: Self-pay | Admitting: Internal Medicine

## 2020-04-03 LAB — HM DIABETES EYE EXAM

## 2020-04-11 ENCOUNTER — Other Ambulatory Visit: Payer: Self-pay

## 2020-04-11 ENCOUNTER — Ambulatory Visit: Payer: Federal, State, Local not specified - PPO | Admitting: Internal Medicine

## 2020-04-11 ENCOUNTER — Telehealth: Payer: Self-pay | Admitting: Internal Medicine

## 2020-04-11 NOTE — Telephone Encounter (Signed)
Patient will need refills - but did not know which ones.  She will call us back from home once she knows what she needs

## 2020-04-19 ENCOUNTER — Encounter: Payer: Self-pay | Admitting: Internal Medicine

## 2020-04-19 ENCOUNTER — Ambulatory Visit: Payer: Federal, State, Local not specified - PPO | Admitting: Internal Medicine

## 2020-04-19 ENCOUNTER — Other Ambulatory Visit: Payer: Self-pay

## 2020-04-19 VITALS — BP 136/72 | HR 83 | Ht 66.0 in | Wt 274.5 lb

## 2020-04-19 DIAGNOSIS — Z794 Long term (current) use of insulin: Secondary | ICD-10-CM

## 2020-04-19 DIAGNOSIS — E66813 Obesity, class 3: Secondary | ICD-10-CM

## 2020-04-19 DIAGNOSIS — E1142 Type 2 diabetes mellitus with diabetic polyneuropathy: Secondary | ICD-10-CM | POA: Diagnosis not present

## 2020-04-19 DIAGNOSIS — E785 Hyperlipidemia, unspecified: Secondary | ICD-10-CM | POA: Diagnosis not present

## 2020-04-19 DIAGNOSIS — IMO0002 Reserved for concepts with insufficient information to code with codable children: Secondary | ICD-10-CM

## 2020-04-19 DIAGNOSIS — E1165 Type 2 diabetes mellitus with hyperglycemia: Secondary | ICD-10-CM

## 2020-04-19 DIAGNOSIS — E669 Obesity, unspecified: Secondary | ICD-10-CM

## 2020-04-19 DIAGNOSIS — S91209A Unspecified open wound of unspecified toe(s) with damage to nail, initial encounter: Secondary | ICD-10-CM

## 2020-04-19 DIAGNOSIS — E118 Type 2 diabetes mellitus with unspecified complications: Secondary | ICD-10-CM | POA: Diagnosis not present

## 2020-04-19 LAB — POCT GLYCOSYLATED HEMOGLOBIN (HGB A1C): Hemoglobin A1C: 7.7 % — AB (ref 4.0–5.6)

## 2020-04-19 MED ORDER — OZEMPIC (1 MG/DOSE) 4 MG/3ML ~~LOC~~ SOPN: 1.0000 mg | PEN_INJECTOR | SUBCUTANEOUS | 3 refills | Status: DC

## 2020-04-19 NOTE — Progress Notes (Signed)
Patient ID: Betty [name], female   DOB: 01-Jan-1956, 64 y.o.   MRN: [id number]  This visit occurred during the SARS-CoV-2 public health emergency.  Safety protocols were in place, including screening questions prior to the visit, additional usage of staff PPE, and extensive cleaning of exam room while observing appropriate contact time as indicated for disinfecting solutions.   HPI: Betty [name] is a 64 y.o. female, returning for f/u for DM2, dx >10 years ago, insulin-dependent, uncontrolled, with complications (CAD - s/p stent, peripheral neuropathy, DR). Last visit 7 months ago.  She is here with her son who brought her to the appointment.  She also lost a nail (left foot 1st toe) this morning after hitting it by mistake.  Reviewed HbA1c levels: Lab Results  Component Value Date   HGBA1C 8.0 (A) 09/21/2019   HGBA1C 7.3 (A) 08/22/2018   HGBA1C 7.0 (A) 05/03/2018   Pt is on a regimen of: - Metformin XR 500 mg 3x a day - Victoza 1.2 mg daily >> Ozempic 0.5 mg weekly  Breakfast  Lunch  Dinner bedtime  Insulin N: 36 units -  - 36 units  Insulin R: 22 units with a  smaller meal                 24 units wiith a  regular meal 22 units with a smaller meal 24 units with a larger meal 22 units with a smaller meal 24 units with a larger meal  -  For sugars: 71-90: take 16 units of R insulin 50-70: do not take R insulin  We tried Bydureon 2 mg weekly (added 08/2015) >> not covered Was previously on 70/30 Insulin: 70-20-30 (split in 3 b/c she was dropping her sugars overnight). On these, she was still having lows in am (40s). She was on Novolog in the past but could not afford it.  She was also on Amaryl and Avandia. She has bladder dysfxn - cannot try SGLT2 inh.  She had diarrhea with regular metformin.  Meter: Liv[place]  Pt checks her sugars twice a day but did not bring the log or meter and cannot remember the values well: - am:68, 70-155, 204 >> (79, 98) 131-146 >>  111-130s, 200 >> 109-138 - 2h after b'fast:  >> 145, 215 >> n/c - before lunch:  84-160, 242 >> 101, 124 >> 110-140 >> ? - 2h after lunch: 151, 199 >> n/c >> 109, 184 >> n/c - before dinner:92-160, 192 >> 131, 178 >> 90-120s, 200 >> ? - bedtime: n/c >> 114, 160 >> 84, 122 >> n/c Lowest CBG:50s >> 68 >> 79 >> 60 >> 70 >> 70-but not frequently; she has hypoglycemia awareness in the 80s  No CKD, last BUN/creatinine was:  Lab Results  Component Value Date   BUN 15 09/29/2019   Lab Results  Component Value Date   CREATININE 0.69 09/29/2019  On ramipril.  + HL; Last set of lipids: Lab Results  Component Value Date   CHOL 107 12/11/2019   HDL 43 12/11/2019   LDLCALC 41 12/11/2019   LDLDIRECT 48 12/11/2019   TRIG 131 12/11/2019   CHOLHDL 2.5 12/11/2019  Prev. on Pravachol and she is not sure why she is not taking this anymore.  She was on Lipitor >> had mm cramps.  On fish oil.  Now also on Crestor 10. - eye exam: 04/2020: + DR, + glaucoma, + cataracts. Dr Senaida Ores -+ Numbness and tingling in her legs.  She was previously  on Lyrica but had to stop due to somnolence.  She also had somnolence with the 300 mg Neurontin tablets >> then 100 mg daily >> now off.  She sees neurology (for  vertigo). Sees Dr Yetta Barre (podiatrist).   She also has a history of HTN, OSA, obesity, OA - on Osteobiflex, vitamin D deficiency.  She developed severe nausea and vertigo on 04/10/2018. She was on Reglan (per Dr. Russella Dar) that helped with the nausea >> now off >> she was out of work because of this for a period of time.  She contacted our office and  I advised her to stop Victoza but her vertigo did not improve so we restarted the dose up.  Of note, brain MRI was negative for a stroke.  She had a stress test with Dr. Eden Emms as she had CP with exertion >> positive >> had a heart cath >> had a stent placed.  She was restarted on a statin afterwards.  ROS: Constitutional: no weight gain/no weight loss, + fatigue,  no subjective hyperthermia, no subjective hypothermia Eyes: no blurry vision, no xerophthalmia ENT: no sore throat, no nodules palpated in neck, no dysphagia, no odynophagia, no hoarseness Cardiovascular: no CP/no SOB/no palpitations/no leg swelling Respiratory: no cough/no SOB/no wheezing Gastrointestinal: + N/no V/+ D/+ C/no acid reflux Musculoskeletal: no muscle aches/no joint aches Skin: no rashes, no hair loss Neurological: no tremors/+ numbness/+ tingling/no dizziness  I reviewed pt's medications, allergies, PMH, social hx, family hx, and changes were documented in the history of present illness. Otherwise, unchanged from my initial visit note.   Past Medical History:  Diagnosis Date  . Arthritis   . CAD, NATIVE VESSEL    a. S/p LAD stent in 2006 b. S/p DES to Lcx 08/2019 and patent LAD stent  . DM (diabetes mellitus) (HCC)   . Fibromyalgia   . HYPERLIPIDEMIA-MIXED   . HYPERTENSION, BENIGN   . Neuropathy   . Obesity   . Osteoarthritis   . Sleep apnea    Patient reported on 04/12/13  . Vertigo    Past Surgical History:  Procedure Laterality Date  . BREATH TEK H PYLORI N/A 03/08/2013   Procedure: BREATH TEK H PYLORI;  Surgeon: Atilano Ina, MD;  Location: [facility];  Service: General;  Laterality: N/A;  . CESAREAN SECTION    . CHOLECYSTECTOMY  1996  . CORONARY ANGIOPLASTY WITH STENT PLACEMENT  11/2004   DES - LAD  . CORONARY STENT INTERVENTION N/A 09/06/2019   Procedure: CORONARY STENT INTERVENTION;  Surgeon: Tonny Bollman, MD;  Location: [facility] INVASIVE CV LAB;  Service: Cardiovascular;  Laterality: N/A;  . CORONARY STENT PLACEMENT  11/2004  . RIGHT/LEFT HEART CATH AND CORONARY ANGIOGRAPHY N/A 09/06/2019   Procedure: RIGHT/LEFT HEART CATH AND CORONARY ANGIOGRAPHY;  Surgeon: Tonny Bollman, MD;  Location: [facility]VASIVE CV LAB;  Service: Cardiovascular;  Laterality: N/A;   Social History   Socioeconomic History  . Marital status: Divorced    Spouse name: Not on file  .  Number of children: 2  . Years of education: 79  . Highest education level: High school graduate  Occupational History  . 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 Smoking status: Never Smoker  . Smokeless tobacco: Never Used  Vaping Use  . Vaping Use: Never used  Substance and Sexual Activity  . Alcohol use: No  . Drug use: No  . Sexual activity: Not on file  Other Topics Concern  . Not on file  Social History  Narrative   Lives alone.   Right-handed.   Caffeine use:  She will sometimes drink up to three 20-oz bottles of soda per day.  Drinks 0.5 bottles of a 5-hour energy drink daily.   Social Determinants of Health   Financial Resource Strain:   . Difficulty of Paying Living Expenses:   Food Insecurity:   . Worried About Programme researcher, broadcasting/film/video in the Last Year:   . Barista in the Last Year:   Transportation Needs:   . Freight forwarder (Medical):   Marland Kitchen Lack of Transportation (Non-Medical):   Physical Activity:   . Days of Exercise per Week:   . Minutes of Exercise per Session:   Stress:   . Feeling of Stress :   Social Connections:   . Frequency of Communication with Friends and Family:   . Frequency of Social Gatherings with Friends and Family:   . Attends Religious Services:   . Active Member of Clubs or Organizations:   . Attends Banker Meetings:   Marland Kitchen Marital Status:   Intimate Partner Violence:   . Fear of Current or Ex-Partner:   . Emotionally Abused:   Marland Kitchen Physically Abused:   . Sexually Abused:    Current Outpatient Medications on File Prior to Visit  Medication Sig Dispense Refill  . acetaminophen (TYLENOL ARTHRITIS PAIN) 650 MG CR tablet Take 650-1,300 mg by mouth every 8 (eight) hours as needed for pain.     . Aromatic Inhalants (VICKS VAPOINHALER IN) Place 1 Dose into both nostrils as needed (congestion.).    Marland Kitchen aspirin EC 81 MG tablet Take 81 mg by mouth daily.    . BD PEN NEEDLE NANO U/F 32G X 4 MM MISC USE ONCE DAILY 100  each 11  . BD VEO INSULIN SYRINGE U/F 31G X 15/64" 1 ML MISC USE 1 NEEDLE 4 TIMES DAILY AS ADVISED 300 each 3  . beta carotene 64332 UNIT capsule Take 25,000 Units by mouth daily.      . Calcium Carbonate-Vitamin D (CALCIUM-VITAMIN D) 600-200 MG-UNIT CAPS Take 1 tablet by mouth every evening.     . carvedilol (COREG) 3.125 MG tablet Take 1 tablet (3.125 mg total) by mouth 2 (two) times daily with a meal. 180 tablet 3  . cetirizine (ZYRTEC) 10 MG tablet Take 10 mg by mouth at bedtime.     . Cholecalciferol (VITAMIN D3) 250 MCG (10000 UT) capsule Take 10,000 Units by mouth daily.    . clopidogrel (PLAVIX) 75 MG tablet TAKE 1 TABLET BY MOUTH DAILY 90 tablet 3  . diphenhydrAMINE (BENADRYL) 25 mg capsule Take 25 mg by mouth every 6 (six) hours as needed for itching.    . gabapentin (NEURONTIN) 100 MG capsule TAKE 1 CAPSULE BY MOUTH EACH NIGHT AT BEDTIME 30 capsule 4  . Homeopathic Products (THERAWORX RELIEF EX) Apply 1 application topically 3 (three) times daily as needed (pain.).    Marland Kitchen icosapent Ethyl (VASCEPA) 1 g capsule Take 2 capsules (2 g total) by mouth 2 (two) times daily. 120 capsule 11  . insulin NPH Human (NOVOLIN N RELION) 100 UNIT/ML injection Inject 0.36 mLs (36 Units total) into the skin 2 (two) times daily. 30 mL 11  . liraglutide (VICTOZA) 18 MG/3ML SOPN Inject 0.2 mLs (1.2 mg total) into the skin daily. 9 pen 3  . Magnesium 250 MG TABS Take 250 mg by mouth daily.     . meloxicam (MOBIC) 7.5 MG tablet Take 7.5 mg by  mouth daily.    . Menthol, Topical Analgesic, (ZIMS MAX-FREEZE EX) Apply 1 application topically 4 (four) times daily as needed (pain).    . metFORMIN (GLUCOPHAGE-XR) 500 MG 24 hr tablet Take 1 tablet (500 mg total) by mouth 3 (three) times daily with meals. Give w/food. 270 tablet 3  . Misc Natural Products (OSTEO BI-FLEX JOINT SHIELD PO) Take 1 tablet by mouth daily.      . nitroGLYCERIN (NITROSTAT) 0.4 MG SL tablet Place 1 tablet (0.4 mg total) under the tongue every 5  (five) minutes as needed for chest pain. 25 tablet 3  . NOVOLIN R RELION 100 UNIT/ML injection USE UP TO 100 UNITS A DAY 30 mL 0  . ondansetron (ZOFRAN) 4 MG tablet Take 4 mg by mouth every 8 (eight) hours as needed for nausea or vomiting.    Marland Kitchen. Pumpkin Seed-Soy Germ (AZO BLADDER CONTROL/GO-LESS PO) Take 1 each by mouth 2 (two) times daily.     . ramipril (ALTACE) 10 MG capsule TAKE 1 CAPSULE BY MOUTH DAILY 90 capsule 1  . rosuvastatin (CRESTOR) 10 MG tablet Take 1 tablet (10 mg total) by mouth daily. 90 tablet 3  . Semaglutide,0.25 or 0.5MG /DOS, (OZEMPIC, 0.25 OR 0.5 MG/DOSE,) 2 MG/1.5ML SOPN Inject 0.5 mg into the skin once a week. 3 pen 5  . ULTRATRAK PRO TEST test strip TEST BLOOD SUGAR 5 TIMES DAILY AS INSTRUCTED 450 each 1   No current facility-administered medications on file prior to visit.   Allergies  Allergen Reactions  . Lyrica [Pregabalin] Other (See Comments)    sleepy   Family History  Problem Relation Age of Onset  . Heart disease Mother   . Cancer Mother        ovarian  . Arthritis Mother   . Diabetes Mother   . Hypertension Mother   . Hypertension Father   . Stroke Father   . Diabetes Sister   . Cancer Unknown   . Coronary artery disease Unknown   . Diabetes Unknown     PE: BP 136/72 (BP Location: Right Arm, Patient Position: Sitting, Cuff Size: Normal)   Pulse 83   Ht 5\' 6"  (1.676 m)   Wt 274 lb 8 oz (124.5 kg)   BMI 44.31 kg/m  Body mass index is 44.31 kg/m. Wt Readings from Last 3 Encounters:  04/19/20 274 lb 8 oz (124.5 kg)  03/01/20 281 lb (127.5 kg)  09/29/19 285 lb (129.3 kg)   Constitutional: overweight, in NAD Eyes: PERRLA, EOMI, no exophthalmos ENT: moist mucous membranes, no thyromegaly, no cervical lymphadenopathy Cardiovascular: RRR, No MRG Respiratory: CTA B Gastrointestinal: abdomen soft, NT, ND, BS+ Musculoskeletal: no deformities, strength intact in all 4 Skin: moist, warm, no rashes, + nail barely attached to left toe nailbed -  blood at the site but no pain Neurological: no tremor with outstretched hands, DTR normal in all 4  ASSESSMENT: 1. DM2, insulin-dependent, uncontrolled, with complications - CAD - s/p stent 11/2004 - Dr Eden EmmsNishan - peripheral neuropathy - DR She told me that her previous endocrinologist filled out FMLA forms 2/2 uncontrolled DM2, but I could not fill this since no major highs or lows or other issues.   2. PN  3. HL  4.  Obesity class III  5.  Avulsed nail  PLAN:  1. DM2 Patient with longstanding, insulin-dependent type 2 diabetes, on basal/bolus insulin regimen with NPH and regular insulin and also Metformin and now weekly GLP-1 receptor agonist, changed from Victoza since last visit.  She was on the lower dose of Victoza, 1.2 mg daily in the past due to constipation with higher doses.  She is currently on Ozempic 0.5 mg weekly, with good tolerance.  Latest HbA1c was higher, at 8.0% at last visit, before switching to Ozempic.  At that time, sugars were very variable, lower in the morning and increasing throughout the day mostly after meals.  Since she was taking a regular insulin at different times before the meals, I advised her to take it 30 minutes before. -At this visit, she can remember some sugars that she checked in the morning but not consistently.  Also, she cannot remember sugars check later in the day and she did not bring her sugar log in her meter. - we checked her HbA1c: 7.7% (slightly better) -Based on her HbA1c, her sugars are still uncontrolled and I suggested to increase the Ozempic dose.  She continues to have some nausea, which was present before starting Ozempic and she does not feel that it is related to this medication.  For now, we will continue the same insulin doses - I advised her to: Patient Instructions   Please continue: - Metformin XR 500 mg 3x a day  Breakfast  Lunch  Dinner bedtime  Insulin N: 36 units -  - 36 units  Insulin R: 22 units with a  smaller  meal                 24 units wiith a  regular meal 22 units with a smaller meal 24 units with a larger meal 22 units with a smaller meal 24 units with a larger meal  -  For sugars: 71-90: take 16 units of R insulin 50-70: do not take R insulin  Please increase: - Ozempic 1 mg weekly  Please return in 4 months with your sugar log or meter.  - advised to check sugars at different times of the day - 3-4x a day, rotating check times - advised for yearly eye exams >> she is not UTD - return to clinic in 4 months   2. PN -Stable -She was previously on a higher dose of Neurontin, 300 mg daily, but she had somnolence with this.  Now taking 100 mg prn.  3. HL -Latest lipid panel was reviewed and this was excellent: Lab Results  Component Value Date   CHOL 107 12/11/2019   HDL 43 12/11/2019   LDLCALC 41 12/11/2019   LDLDIRECT 48 12/11/2019   TRIG 131 12/11/2019   CHOLHDL 2.5 12/11/2019  -She previously had muscle aches with Lipitor.  She was then on pravastatin, which she stopped for an unknown reason.  Currently taking Crestor 10 suggested at last visit.  4.  Obesity class III -Before last visit, she gained about 10 pounds -Continue GLP-1 receptor agonist which should also help with weight loss -She lost 11 pounds since last visit  5. Avulsed nail - L big toe -The nail is barely attached to the nailbed -she washed it with peroxide -No signs of infection -Discussed about following with podiatry since especially she also has a callus on the same foot  Carlus Pavlov, MD PhD [facility]

## 2020-04-19 NOTE — Patient Instructions (Addendum)
Please continue: - Metformin XR 500 mg 3x a day  Breakfast  Lunch  Dinner bedtime  Insulin N: 36 units -  - 36 units  Insulin R: 22 units with a  smaller meal                 24 units wiith a  regular meal 22 units with a smaller meal 24 units with a larger meal 22 units with a smaller meal 24 units with a larger meal  -  For sugars: 71-90: take 16 units of R insulin 50-70: do not take R insulin  Please increase: - Ozempic 1 mg weekly  Please return in 4 months with your sugar log or meter.

## 2020-04-26 ENCOUNTER — Other Ambulatory Visit: Payer: Self-pay

## 2020-04-26 ENCOUNTER — Ambulatory Visit: Payer: Federal, State, Local not specified - PPO | Admitting: Podiatry

## 2020-04-26 DIAGNOSIS — S91202A Unspecified open wound of left great toe with damage to nail, initial encounter: Secondary | ICD-10-CM | POA: Diagnosis not present

## 2020-04-26 DIAGNOSIS — L989 Disorder of the skin and subcutaneous tissue, unspecified: Secondary | ICD-10-CM

## 2020-04-26 DIAGNOSIS — E0843 Diabetes mellitus due to underlying condition with diabetic autonomic (poly)neuropathy: Secondary | ICD-10-CM | POA: Diagnosis not present

## 2020-04-26 DIAGNOSIS — S91209A Unspecified open wound of unspecified toe(s) with damage to nail, initial encounter: Secondary | ICD-10-CM

## 2020-04-30 NOTE — Progress Notes (Signed)
° °  HPI: 64 y.o. female PMHx diabetes mellitus type 2 on anticoagulant therapy presenting today as a new patient for evaluation of an injury that occurred to the left hallux nail plate approximately 1 week ago.  Patient does not recall what happened however she noticed that she was getting bleeding and the nail was loose.  Her PCP stated that the nail need to be removed.  She was referred here and she presents for further treatment and evaluation  Past Medical History:  Diagnosis Date   Arthritis    CAD, NATIVE VESSEL    a. S/p LAD stent in 2006 b. S/p DES to Lcx 08/2019 and patent LAD stent   DM (diabetes mellitus) (HCC)    Fibromyalgia    HYPERLIPIDEMIA-MIXED    HYPERTENSION, BENIGN    Neuropathy    Obesity    Osteoarthritis    Sleep apnea    Patient reported on 04/12/13   Vertigo      Physical Exam: General: The patient is alert and oriented x3 in no acute distress.  Dermatology: Skin is warm, dry and supple bilateral lower extremities. Negative for open lesions or macerations.  Loosely adhered left hallux nail plate noted.  At the moment there is no active bleeding.  There is no periwound erythema or edema around the hallux nail plate.  No evidence of infection. Hyperkeratotic callus tissue noted to the plantar aspect of the left hallux.  There does appear to be some bleeding within the dermal tissue concerning for preulcerative callus that could lead to DFU.  Vascular: Palpable pedal pulses bilaterally. No edema or erythema noted. Capillary refill within normal limits.  Neurological: Epicritic and protective threshold absent bilaterally.   Musculoskeletal Exam: Range of motion within normal limits to all pedal and ankle joints bilateral. Muscle strength 5/5 in all groups bilateral.  Assessment: 1.  Trauma left hallux nail plate 2.  Preulcerative callus lesion left hallux 3.  Diabetes mellitus with peripheral polyneuropathy   Plan of Care:  1. Patient evaluated. 2.   The toe was prepped in an aseptic manner and total temporary nail avulsion of the left hallux nail plate was performed in an aseptic manner.  No local anesthesia was utilized since the patient is completely neuropathic and has no sensation.  Light dressing applied.  Recommend antibiotic ointment and a Band-Aid daily 3.  Excisional debridement of the preulcerative callus lesion to the left hallux was performed using a chisel blade without incident or bleeding 4.  Recommend good supportive shoe gear 5.  Return to clinic annually      Felecia Shelling, DPM [facility]  Dr. Felecia Shelling, DPM    [address] [place], [place] [address]                Office [phone]  Fax [phone]

## 2020-05-13 ENCOUNTER — Ambulatory Visit: Payer: Federal, State, Local not specified - PPO | Admitting: Podiatry

## 2020-05-14 ENCOUNTER — Other Ambulatory Visit: Payer: Self-pay | Admitting: Internal Medicine

## 2020-05-16 ENCOUNTER — Ambulatory Visit: Payer: Federal, State, Local not specified - PPO | Admitting: Podiatry

## 2020-05-16 ENCOUNTER — Other Ambulatory Visit: Payer: Self-pay

## 2020-05-16 ENCOUNTER — Encounter: Payer: Self-pay | Admitting: Podiatry

## 2020-05-16 DIAGNOSIS — S91209A Unspecified open wound of unspecified toe(s) with damage to nail, initial encounter: Secondary | ICD-10-CM | POA: Diagnosis not present

## 2020-05-16 DIAGNOSIS — E0843 Diabetes mellitus due to underlying condition with diabetic autonomic (poly)neuropathy: Secondary | ICD-10-CM | POA: Diagnosis not present

## 2020-05-16 DIAGNOSIS — L989 Disorder of the skin and subcutaneous tissue, unspecified: Secondary | ICD-10-CM

## 2020-05-16 NOTE — Progress Notes (Signed)
This patient returns to the office following the removal of the left great toenail on 7/16 by Dr. Logan Bores. She presents the office today s saying she has crust and hard area noted at the site of the nail surgery and is concerned that it is healing properly. This patient is also requesting a diabetic foot exam at this visit finally she says she has a painful callus under her left big toe that was previously treated by Dr. Logan Bores. She presents the office today for an evaluation of her diabetic feet.  She is having a coagulation defect due to plavix.   Vascular  Dorsalis pedis and posterior tibial pulses are palpable  right foot.  Dorsalis pedis and posterior tibial pulses are absent left foot..  Capillary return  WNL.  Temperature gradient is  WNL.  Skin turgor  WNL  Sensorium  Senn Weinstein monofilament wire  Absent B/L.Marland Kitchen Normal tactile sensation.  Nail Exam  Patient has normal nails with no evidence of bacterial or fungal infection. Avulsed nail plate with crust at nail bed left hallux.  Orthopedic  Exam  Muscle tone and muscle strength  WNL.  No limitations of motion feet  B/L.  No crepitus or joint effusion noted.  Foot type is unremarkable and digits show no abnormalities.  Bony prominences are unremarkable.  Skin  No open lesions.  Normal skin texture and turgor. Asymptomatic pinch callus left hallux.  S/P nail surgery healing uneventfully.  Diabetic foot exam performed.  ROV. The crest on the left hallux was smoothed using a dermal tool. Diabetic foot exam revealed vascular and neurologic pathology. Due to the fact that she had absent L OPS I checked to make sure she had a scheduled appointment with Raif[facility] Noble for diabetic shoes in the future. She does have a scheduled appointment and I told her to return to the office annually for diabetic foot exam.   Helane Gunther DPM

## 2020-05-28 ENCOUNTER — Telehealth: Payer: Self-pay | Admitting: Podiatry

## 2020-05-28 NOTE — Telephone Encounter (Signed)
Pt called asking about our diabetic shoes. She asked about the styles we have because she has an appt tomorrow and did not like any of the shoes on display. I explained we had a few other options but she is going to go look elsewhere to get her diabetic shoes.

## 2020-05-28 NOTE — Telephone Encounter (Signed)
I looks like she being seen by FNP managing her DB, so we couldn't help her anyway

## 2020-05-29 ENCOUNTER — Other Ambulatory Visit: Payer: Federal, State, Local not specified - PPO | Admitting: Orthotics

## 2020-06-11 ENCOUNTER — Ambulatory Visit: Payer: Federal, State, Local not specified - PPO | Admitting: Internal Medicine

## 2020-07-05 ENCOUNTER — Other Ambulatory Visit: Payer: Self-pay | Admitting: Internal Medicine

## 2020-08-15 ENCOUNTER — Other Ambulatory Visit: Payer: Self-pay | Admitting: Internal Medicine

## 2020-08-23 ENCOUNTER — Ambulatory Visit: Payer: Federal, State, Local not specified - PPO | Admitting: Internal Medicine

## 2020-08-25 ENCOUNTER — Other Ambulatory Visit: Payer: Self-pay | Admitting: Internal Medicine

## 2020-08-27 ENCOUNTER — Ambulatory Visit: Payer: Federal, State, Local not specified - PPO | Admitting: Internal Medicine

## 2020-08-29 ENCOUNTER — Ambulatory Visit: Payer: Federal, State, Local not specified - PPO | Admitting: Internal Medicine

## 2020-08-29 NOTE — Progress Notes (Deleted)
Patient ID: Betty [name], female   DOB: 1956/09/22, 64 y.o.   MRN: [id number]  This visit occurred during the SARS-CoV-2 public health emergency.  Safety protocols were in place, including screening questions prior to the visit, additional usage of staff PPE, and extensive cleaning of exam room while observing appropriate contact time as indicated for disinfecting solutions.   HPI: Betty [name] is a 64 y.o. female, returning for f/u for DM2, dx >10 years ago, insulin-dependent, uncontrolled, with complications (CAD - s/p stent, peripheral neuropathy, DR). Last visit 4.5 months ago.  Reviewed HbA1c levels: Lab Results  Component Value Date   HGBA1C 7.7 (A) 04/19/2020   HGBA1C 8.0 (A) 09/21/2019   HGBA1C 7.3 (A) 08/22/2018   Pt is on a regimen of: - Metformin XR 500 mg 3x a day - Victoza 1.2 mg daily >> Ozempic 0.5 mg weekly >> 1 mg weekly  Breakfast  Lunch  Dinner bedtime  Insulin N: 36 units -  - 36 units  Insulin R: 22 units with a  smaller meal                 24 units wiith a  regular meal 22 units with a smaller meal 24 units with a larger meal 22 units with a smaller meal 24 units with a larger meal  -  For sugars: 71-90: take 16 units of R insulin 50-70: do not take R insulin  We tried Bydureon 2 mg weekly (added 08/2015) >> not covered Was previously on 70/30 Insulin: 70-20-30 (split in 3 b/c she was dropping her sugars overnight). On these, she was still having lows in am (40s). She was on Novolog in the past but could not afford it.  She was also on Amaryl and Avandia. She has bladder dysfxn - cannot try SGLT2 inh.  She had diarrhea with regular metformin.  Meter: [place]  Pt checks her sugars twice a day: - am:  (79, 98) 131-146 >> 111-130s, 200 >> 109-138 - 2h after b'fast:  >> 145, 215 >> n/c - before lunch:  84-160, 242 >> 101, 124 >> 110-140 >> ? - 2h after lunch: 151, 199 >> n/c >> 109, 184 >> n/c - before dinner:92-160, 192 >> 131, 178 >>  90-120s, 200 >> ? - bedtime: n/c >> 114, 160 >> 84, 122 >> n/c Lowest CBG:50s >> ... 70-but not frequently; she has hypoglycemia awareness in the 80s.  No CKD, last BUN/creatinine was:  Lab Results  Component Value Date   BUN 15 09/29/2019   Lab Results  Component Value Date   CREATININE 0.69 09/29/2019  On ramipril.  + HL; Last set of lipids: Lab Results  Component Value Date   CHOL 107 12/11/2019   HDL 43 12/11/2019   LDLCALC 41 12/11/2019   LDLDIRECT 48 12/11/2019   TRIG 131 12/11/2019   CHOLHDL 2.5 12/11/2019  Prev. on Pravachol and she is not sure why she is not taking this anymore.  She was on Lipitor >> had mm cramps.  Now on fish oil and Crestor 10. - eye exam: 04/2020: + DR, + glaucoma, + cataract. Dr Senaida Oresichardson -She has numbness and tingling in her legs.  She has somnolence with Lyrica and 300 mg Neurontin tablets.  She sees neurology (for  vertigo). Sees Dr Yetta BarreJones (podiatrist).   She also has a history of HTN, OSA, obesity, OA - on Osteobiflex, vitamin D deficiency.  She developed severe nausea and vertigo on 04/10/2018. She was on Reglan (per  Dr. Russella Dar) that helped with the nausea >> now off >> she was out of work because of this for a period of time.  She contacted our office and  I advised her to stop Victoza but her vertigo did not improve so we restarted the dose up.  Of note, brain MRI was negative for a stroke.  She had a stress test with Dr. Eden Emms as she had CP with exertion >> positive >> had a heart cath >> had a stent placed.  She was restarted on a statin afterwards.  ROS: Constitutional: no weight gain/no weight loss, + fatigue, no subjective hyperthermia, no subjective hypothermia Eyes: no blurry vision, no xerophthalmia ENT: no sore throat, no nodules palpated in neck, no dysphagia, no odynophagia, no hoarseness Cardiovascular: no CP/no SOB/no palpitations/no leg swelling Respiratory: no cough/no SOB/no wheezing Gastrointestinal: no N/no V/+ D/+ C/no  acid reflux Musculoskeletal: no muscle aches/no joint aches Skin: no rashes, no hair loss Neurological: no tremors/+ numbness/+ tingling/no dizziness  I reviewed pt's medications, allergies, PMH, social hx, family hx, and changes were documented in the history of present illness. Otherwise, unchanged from my initial visit note.  Past Medical History:  Diagnosis Date  . Arthritis   . CAD, NATIVE VESSEL    a. S/p LAD stent in 2006 b. S/p DES to Lcx 08/2019 and patent LAD stent  . DM (diabetes mellitus) (HCC)   . Fibromyalgia   . HYPERLIPIDEMIA-MIXED   . HYPERTENSION, BENIGN   . Neuropathy   . Obesity   . Osteoarthritis   . Sleep apnea    Patient reported on 04/12/13  . Vertigo    Past Surgical History:  Procedure Laterality Date  . BREATH TEK H PYLORI N/A 03/08/2013   Procedure: BREATH TEK H PYLORI;  Surgeon: Atilano Ina, MD;  Location: [facility];  Service: General;  Laterality: N/A;  . CESAREAN SECTION    . CHOLECYSTECTOMY  1996  . CORONARY ANGIOPLASTY WITH STENT PLACEMENT  11/2004   DES - LAD  . CORONARY STENT INTERVENTION N/A 09/06/2019   Procedure: CORONARY STENT INTERVENTION;  Surgeon: Tonny Bollman, MD;  Location[facility] INVASIVE CV LAB;  Service: Cardiovascular;  Laterality: N/A;  . CORONARY STENT PLACEMENT  11/2004  . RIGHT/LEFT HEART CATH AND CORONARY ANGIOGRAPHY N/A 09/06/2019   Procedure: RIGHT/LEFT HEART CATH AND CORONARY ANGIOGRAPHY;  Surgeon: Tonny Bollman, MD;  Location: [facility] INVASIVE CV LAB;  Service: Cardiovascular;  Laterality: N/A;   Social History   Socioeconomic History  . Marital status: Divorced    Spouse name: Not on file  . Number of children: 2  . Years of education: 46  . Highest education level: High school graduate  Occupational History  . 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 Smoking status: Never Smoker  . Smokeless tobacco: Never Used  Vaping Use  . Vaping Use: Never used  Substance and Sexual Activity  . Alcohol use: No  .  Drug use: No  . Sexual activity: Not on file  Other Topics Concern  . Not on file  Social History Narrative   Lives alone.   Right-handed.   Caffeine use:  She will sometimes drink up to three 20-oz bottles of soda per day.  Drinks 0.5 bottles of a 5-hour energy drink daily.   Social Determinants of Health   Financial Resource Strain:   . Difficulty of Paying Living Expenses: Not on file  Food Insecurity:   . Worried About Programme researcher, broadcasting/film/video in the  Last Year: Not on file  . Ran Out of Food in the Last Year: Not on file  Transportation Needs:   . Lack of Transportation (Medical): Not on file  . Lack of Transportation (Non-Medical): Not on file  Physical Activity:   . Days of Exercise per Week: Not on file  . Minutes of Exercise per Session: Not on file  Stress:   . Feeling of Stress : Not on file  Social Connections:   . Frequency of Communication with Friends and Family: Not on file  . Frequency of Social Gatherings with Friends and Family: Not on file  . Attends Religious Services: Not on file  . Active Member of Clubs or Organizations: Not on file  . Attends Banker Meetings: Not on file  . Marital Status: Not on file  Intimate Partner Violence:   . Fear of Current or Ex-Partner: Not on file  . Emotionally Abused: Not on file  . Physically Abused: Not on file  . Sexually Abused: Not on file   Current Outpatient Medications on File Prior to Visit  Medication Sig Dispense Refill  . acetaminophen (TYLENOL ARTHRITIS PAIN) 650 MG CR tablet Take 650-1,300 mg by mouth every 8 (eight) hours as needed for pain.     . Aromatic Inhalants (VICKS VAPOINHALER IN) Place 1 Dose into both nostrils as needed (congestion.).    Marland Kitchen aspirin EC 81 MG tablet Take 81 mg by mouth daily.    . BD PEN NEEDLE NANO U/F 32G X 4 MM MISC USE ONCE DAILY 100 each 11  . BD VEO INSULIN SYRINGE U/F 31G X 15/64" 1 ML MISC USE 1 NEEDLE 4 TIMES DAILY AS ADVISED 300 each 3  . beta carotene 42706 UNIT  capsule Take 25,000 Units by mouth daily.      . Calcium Carbonate-Vitamin D (CALCIUM-VITAMIN D) 600-200 MG-UNIT CAPS Take 1 tablet by mouth every evening.     . carvedilol (COREG) 3.125 MG tablet Take 1 tablet (3.125 mg total) by mouth 2 (two) times daily with a meal. 180 tablet 3  . cetirizine (ZYRTEC) 10 MG tablet Take 10 mg by mouth at bedtime.     . Cholecalciferol (VITAMIN D3) 250 MCG (10000 UT) capsule Take 10,000 Units by mouth daily.    . clopidogrel (PLAVIX) 75 MG tablet TAKE 1 TABLET BY MOUTH DAILY 90 tablet 3  . diphenhydrAMINE (BENADRYL) 25 mg capsule Take 25 mg by mouth every 6 (six) hours as needed for itching.    . gabapentin (NEURONTIN) 100 MG capsule TAKE 1 CAPSULE BY MOUTH EACH NIGHT AT BEDTIME 30 capsule 4  . Homeopathic Products (THERAWORX RELIEF EX) Apply 1 application topically 3 (three) times daily as needed (pain.).    Marland Kitchen icosapent Ethyl (VASCEPA) 1 g capsule Take 2 capsules (2 g total) by mouth 2 (two) times daily. 120 capsule 11  . insulin NPH Human (NOVOLIN N RELION) 100 UNIT/ML injection Inject 0.36 mLs (36 Units total) into the skin 2 (two) times daily. 30 mL 11  . Magnesium 250 MG TABS Take 250 mg by mouth daily.     . meloxicam (MOBIC) 7.5 MG tablet Take 7.5 mg by mouth daily.    . Menthol, Topical Analgesic, (ZIMS MAX-FREEZE EX) Apply 1 application topically 4 (four) times daily as needed (pain).    . metFORMIN (GLUCOPHAGE-XR) 500 MG 24 hr tablet Take 1 tablet (500 mg total) by mouth 3 (three) times daily with meals. Give w/food. 270 tablet 3  . Misc  Natural Products (OSTEO BI-FLEX JOINT SHIELD PO) Take 1 tablet by mouth daily.      . nitroGLYCERIN (NITROSTAT) 0.4 MG SL tablet Place 1 tablet (0.4 mg total) under the tongue every 5 (five) minutes as needed for chest pain. (Patient not taking: Reported on 04/19/2020) 25 tablet 3  . NOVOLIN R RELION 100 UNIT/ML injection USE UP TO 100 UNITS A DAY 30 mL 0  . ondansetron (ZOFRAN) 4 MG tablet Take 4 mg by mouth every 8  (eight) hours as needed for nausea or vomiting.     Marland Kitchen Pumpkin Seed-Soy Germ (AZO BLADDER CONTROL/GO-LESS PO) Take 1 each by mouth 2 (two) times daily.     . ramipril (ALTACE) 10 MG capsule TAKE 1 CAPSULE BY MOUTH DAILY 90 capsule 1  . rosuvastatin (CRESTOR) 10 MG tablet Take 1 tablet (10 mg total) by mouth daily. 90 tablet 3  . Semaglutide, 1 MG/DOSE, (OZEMPIC, 1 MG/DOSE,) 4 MG/3ML SOPN Inject 0.75 mLs (1 mg total) into the skin once a week. 9 mL 3  . ULTRATRAK PRO TEST test strip TEST BLOOD SUGAR 5 TIMES DAILY AS INSTRUCTED 450 each 1   No current facility-administered medications on file prior to visit.   Allergies  Allergen Reactions  . Lyrica [Pregabalin] Other (See Comments)    sleepy   Family History  Problem Relation Age of Onset  . Heart disease Mother   . Cancer Mother        ovarian  . Arthritis Mother   . Diabetes Mother   . Hypertension Mother   . Hypertension Father   . Stroke Father   . Diabetes Sister   . Cancer Unknown   . Coronary artery disease Unknown   . Diabetes Unknown     PE: There were no vitals taken for this visit. There is no height or weight on file to calculate BMI. Wt Readings from Last 3 Encounters:  04/19/20 274 lb 8 oz (124.5 kg)  03/01/20 281 lb (127.5 kg)  09/29/19 285 lb (129.3 kg)   Constitutional: overweight, in NAD Eyes: PERRLA, EOMI, no exophthalmos ENT: moist mucous membranes, no thyromegaly, no cervical lymphadenopathy Cardiovascular: RRR, No MRG Respiratory: CTA B Gastrointestinal: abdomen soft, NT, ND, BS+ Musculoskeletal: no deformities, strength intact in all 4 Skin: moist, warm, no rashes Neurological: no tremor with outstretched hands, DTR normal in all 4  ASSESSMENT: 1. DM2, insulin-dependent, uncontrolled, with complications - CAD - s/p stent 11/2004 - Dr Eden Emms - peripheral neuropathy - DR She told me that her previous endocrinologist filled out FMLA forms 2/2 uncontrolled DM2, but I could not fill this since no  major highs or lows or other issues.   2. PN  3. HL  4.  Obesity class III  PLAN:  1. DM2 Patient with longstanding, insulin-dependent type 2 diabetes, on basal-bolus insulin regimen with NPH and regular insulin due to price, and also on Metformin and weekly GLP-1 receptor agonist.  After changing from Victoza to Ozempic, her sugars improved and she also lost weight.  At last visit, HbA1c was 7.7%, slightly lower.  At that time, she was taking 0.5 mg of Ozempic weekly and we increased it to 1 mg weekly.  We did not change her insulin doses.  - I advised her to: Patient Instructions   Please continue: - Metformin XR 500 mg 3x a day - Ozempic 1 mg weekly Breakfast  Lunch  Dinner bedtime  Insulin N: 36 units -  - 36 units  Insulin R: 22  units with a  smaller meal                 24 units wiith a  regular meal 22 units with a smaller meal 24 units with a larger meal 22 units with a smaller meal 24 units with a larger meal  -  For sugars: 71-90: take 16 units of R insulin 50-70: do not take R insulin  Please return in 4 months with your sugar log or meter.  - we checked her HbA1c: 7%  - advised to check sugars at different times of the day - 4x a day, rotating check times - advised for yearly eye exams >> she is UTD - return to clinic in 4 months  2. PN -stable -She was previously on a higher dose of Neurontin, 300 mg daily, but she had somnolence with this so she is not taking 100 mg as needed  3. HL -Reviewed latest lipid panel from 12/2019: Excellent fractions: Lab Results  Component Value Date   CHOL 107 12/11/2019   HDL 43 12/11/2019   LDLCALC 41 12/11/2019   LDLDIRECT 48 12/11/2019   TRIG 131 12/11/2019   CHOLHDL 2.5 12/11/2019  -She previously had muscle aches with Lipitor.  She was then on pravastatin, which she stopped for an unknown reason.  She is currently on Crestor 10, which he tolerates well.  4.  Obesity class III -Continue GLP-1 receptor agonist,  which should also help with weight loss -Before last visit, she lost 11 pounds    Carlus Pavlov, MD PhD [facility]

## 2020-09-18 ENCOUNTER — Other Ambulatory Visit: Payer: Self-pay | Admitting: Cardiovascular Disease

## 2020-09-27 ENCOUNTER — Ambulatory Visit
Admission: EM | Admit: 2020-09-27 | Discharge: 2020-09-27 | Disposition: A | Discharge status: Home / Self Care | Payer: Federal, State, Local not specified - PPO | Attending: Family Medicine | Admitting: Family Medicine

## 2020-09-27 ENCOUNTER — Telehealth: Payer: Self-pay | Admitting: *Deleted

## 2020-09-27 DIAGNOSIS — L089 Local infection of the skin and subcutaneous tissue, unspecified: Secondary | ICD-10-CM | POA: Diagnosis not present

## 2020-09-27 DIAGNOSIS — E11628 Type 2 diabetes mellitus with other skin complications: Secondary | ICD-10-CM | POA: Diagnosis not present

## 2020-09-27 MED ORDER — CEPHALEXIN 500 MG PO CAPS: 500.0000 mg | ORAL_CAPSULE | Freq: Four times a day (QID) | ORAL | 0 refills | Status: DC

## 2020-09-27 MED ORDER — SULFAMETHOXAZOLE-TRIMETHOPRIM 800-160 MG PO TABS: 1.0000 | ORAL_TABLET | Freq: Two times a day (BID) | ORAL | 0 refills | Status: AC

## 2020-09-27 NOTE — Telephone Encounter (Signed)
I received a message that you need to be seen for an appointment today.  "Yes, I have an ulcer on my big toe.  It's been bleeding and it has turned black according to my son.  It doesn't hurt because I have Neuropathy.  I would have called sooner if I had known it was like this.  I can't see well.  I thought the blackness was coming from the shoe I was wearing."  How soon can you be here?  "I can be there in about 15 - 20 minutes."  Come on and we will see you, if you can be here within that time span.  "Well my son said we can't be there that soon.  He said it will be about a hour or more before we can get there."  The doctor will not be here by that time.   "My son is asking if we should go to an Urgent Care or something like that."  Yes, that is recommended.  "Sorry, I didn't call sooner.  I didn't know."  No worries, take care of yourself.

## 2020-09-27 NOTE — Discharge Instructions (Addendum)
Treating you for a foot infection.  Take the antibiotics as prescribed. Go to the ER for any worsening symptoms. Otherwise you can follow-up with your primary care as needed

## 2020-09-27 NOTE — ED Provider Notes (Signed)
[facility]    CSN: [id number] Arrival date & time: 09/27/20  1510      History   Chief Complaint Chief Complaint  Patient presents with   Toe Pain    HPI Betty FerrierHelen E Savich is a 64 y.o. female.   Patient is a 64 year old female past medical history of neuropathy, diabetes.  She presents today with left great toe swelling, erythema, draining.  Started yesterday.  Recently bought some new shoes.  Also had her calluses shaved down recently.  Mild swelling to the foot.  No fevers, chills, body aches, nausea. No pain.      Past Medical History:  Diagnosis Date   Arthritis    CAD, NATIVE VESSEL    a. S/p LAD stent in 2006 b. S/p DES to Lcx 08/2019 and patent LAD stent   DM (diabetes mellitus) (HCC)    Fibromyalgia    HYPERLIPIDEMIA-MIXED    HYPERTENSION, BENIGN    Neuropathy    Obesity    Osteoarthritis    Sleep apnea    Patient reported on 04/12/13   Vertigo     Patient Active Problem List   Diagnosis Date Noted   Abnormal nuclear cardiac imaging test 09/06/2019   Cerebellar ataxia in diseases classified elsewhere (HCC) 08/06/2018   Gait abnormality 08/02/2018   Diabetic peripheral neuropathy (HCC) 06/17/2017   Uncontrolled type 2 diabetes mellitus with complication, with long-term current use of insulin (HCC) 03/06/2016   Osteoarthritis    Obesity    Sleep apnea    Hyperlipidemia 07/31/2009   HYPERTENSION, BENIGN 07/31/2009   CAD, NATIVE VESSEL 07/31/2009    Past Surgical History:  Procedure Laterality Date   BREATH TEK H PYLORI N/A 03/08/2013   Procedure: BREATH TEK H PYLORI;  Surgeon: Atilano InaEric M Wilson, MD;  [facility];  Service: General;  Laterality: N/A;   CESAREAN SECTION     CHOLECYSTECTOMY  1996   CORONARY ANGIOPLASTY WITH STENT PLACEMENT  11/2004   DES - LAD   CORONARY STENT INTERVENTION N/A 09/06/2019   Procedure: CORONARY STENT INTERVENTION;  Surgeon: Tonny Bollmanooper, Michael, MD;  [facility];  Service: Cardiovascular;  Laterality: N/A;   CORONARY STENT PLACEMENT  11/2004   RIGHT/LEFT HEART CATH AND CORONARY ANGIOGRAPHY N/A 09/06/2019   Procedure: RIGHT/LEFT HEART CATH AND CORONARY ANGIOGRAPHY;  Surgeon: Tonny Bollmanooper, Michael, MD;  [facility];  Service: Cardiovascular;  Laterality: N/A;    OB History   No obstetric history on file.      Home Medications    Prior to Admission medications   Medication Sig Start Date End Date Taking? Authorizing Provider  acetaminophen (TYLENOL ARTHRITIS PAIN) 650 MG CR tablet Take 650-1,300 mg by mouth every 8 (eight) hours as needed for pain.     [provider]  Aromatic Inhalants (VICKS VAPOINHALER IN) Place 1 Dose into both nostrils as needed (congestion.).    [provider]  aspirin EC 81 MG tablet Take 81 mg by mouth daily.    [provider]  BD PEN NEEDLE NANO U/F 32G X 4 MM MISC USE ONCE DAILY 08/25/19   Carlus PavlovGherghe, Cristina, MD  BD VEO INSULIN SYRINGE U/F 31G X 15/64" 1 ML MISC USE 1 NEEDLE 4 TIMES DAILY AS ADVISED 01/01/20   Carlus PavlovGherghe, Cristina, MD  beta carotene [id number] UNIT capsule Take 25,000 Units by mouth daily.      [provider]  Calcium Carbonate-Vitamin D (CALCIUM-VITAMIN D) 600-200 MG-UNIT CAPS Take 1 tablet by  mouth every evening.     [provider]  carvedilol (COREG) 3.125 MG tablet TAKE 1 TABLET BY MOUTH TWICE DAILY WITH A MEAL 09/18/20   Wendall Stade, MD  cephALEXin (KEFLEX) 500 MG capsule Take 1 capsule (500 mg total) by mouth 4 (four) times daily. 09/27/20   Dahlia Byes A, NP  cetirizine (ZYRTEC) 10 MG tablet Take 10 mg by mouth at bedtime.     [provider]  Cholecalciferol (VITAMIN D3) 250 MCG (10000 UT) capsule Take 10,000 Units by mouth daily.    [provider]  clopidogrel (PLAVIX) 75 MG tablet TAKE 1 TABLET BY MOUTH DAILY 11/01/19   Tonny Bollman, MD  diphenhydrAMINE (BENADRYL) 25 mg capsule Take 25 mg by mouth every 6 (six) hours  as needed for itching.    [provider]  gabapentin (NEURONTIN) 100 MG capsule TAKE 1 CAPSULE BY MOUTH EACH NIGHT AT BEDTIME 10/04/19   Carlus Pavlov, MD  Homeopathic Products [facility] RELIEF EX) Apply 1 application topically 3 (three) times daily as needed (pain.).    [provider]  icosapent Ethyl (VASCEPA) 1 g capsule Take 2 capsules (2 g total) by mouth 2 (two) times daily. 10/19/19   Wendall Stade, MD  insulin NPH Human (NOVOLIN N RELION) 100 UNIT/ML injection Inject 0.36 mLs (36 Units total) into the skin 2 (two) times daily. 09/21/19   Carlus Pavlov, MD  Magnesium 250 MG TABS Take 250 mg by mouth daily.     [provider]  meloxicam (MOBIC) 7.5 MG tablet Take 7.5 mg by mouth daily. 09/18/19   [provider]  Menthol, Topical Analgesic, (ZIMS MAX-FREEZE EX) Apply 1 application topically 4 (four) times daily as needed (pain).    [provider]  metFORMIN (GLUCOPHAGE-XR) 500 MG 24 hr tablet Take 1 tablet (500 mg total) by mouth 3 (three) times daily with meals. Give w/food. 09/21/19   Carlus Pavlov, MD  Misc Natural Products (OSTEO BI-FLEX JOINT SHIELD PO) Take 1 tablet by mouth daily.      [provider]  nitroGLYCERIN (NITROSTAT) 0.4 MG SL tablet Place 1 tablet (0.4 mg total) under the tongue every 5 (five) minutes as needed for chest pain. Patient not taking: Reported on 04/19/2020 08/24/19 11/22/19  Wendall Stade, MD  NOVOLIN R RELION 100 UNIT/ML injection USE UP TO 100 UNITS A DAY 08/26/20   Carlus Pavlov, MD  ondansetron (ZOFRAN) 4 MG tablet Take 4 mg by mouth every 8 (eight) hours as needed for nausea or vomiting.     [provider]  Pumpkin Seed-Soy Germ (AZO BLADDER CONTROL/GO-LESS PO) Take 1 each by mouth 2 (two) times daily.     [provider]  ramipril (ALTACE) 10 MG capsule TAKE 1 CAPSULE BY MOUTH DAILY 08/15/20   Carlus Pavlov, MD  rosuvastatin (CRESTOR) 10 MG tablet Take 1 tablet  (10 mg total) by mouth daily. 09/21/19   Carlus Pavlov, MD  Semaglutide, 1 MG/DOSE, (OZEMPIC, 1 MG/DOSE,) 4 MG/3ML SOPN Inject 0.75 mLs (1 mg total) into the skin once a week. 04/19/20   Carlus Pavlov, MD  sulfamethoxazole-trimethoprim (BACTRIM DS) 800-160 MG tablet Take 1 tablet by mouth 2 (two) times daily for 7 days. 09/27/20 10/04/20  Janace Aris, NP  Sherryl Barters PRO TEST test strip TEST BLOOD SUGAR 5 TIMES DAILY AS INSTRUCTED 10/10/18   Carlus Pavlov, MD    Family History Family History  Problem Relation Age of Onset   Heart disease Mother  Cancer Mother        ovarian   Arthritis Mother    Diabetes Mother    Hypertension Mother    Hypertension Father    Stroke Father    Diabetes Sister    Cancer Other    Coronary artery disease Other    Diabetes Other     Social History Social History   Tobacco Use   Smoking status: Never Smoker   Smokeless tobacco: Never Used  Vaping Use   Vaping Use: Never used  Substance Use Topics   Alcohol use: No   Drug use: No     Allergies   Lyrica [pregabalin]   Review of Systems Review of Systems   Physical Exam Triage Vital Signs ED Triage Vitals [09/27/20 1527]  Enc Vitals Group     BP (!) 156/71     Pulse Rate 84     Resp 16     Temp 98.6 F (37 C)     Temp Source Oral     SpO2 95 %     Weight 274 lb 7.6 oz (124.5 kg)     Height 5\' 6"  (1.676 m)     Head Circumference      Peak Flow      Pain Score 0     Pain Loc      Pain Edu?      Excl. in GC?    No data found.  Updated Vital Signs BP (!) 156/71    Pulse 84    Temp 98.6 F (37 C) (Oral)    Resp 16    Ht 5\' 6"  (1.676 m)    Wt 274 lb 7.6 oz (124.5 kg)    SpO2 95%    BMI 44.30 kg/m   Visual Acuity Right Eye Distance:   Left Eye Distance:   Bilateral Distance:    Right Eye Near:   Left Eye Near:    Bilateral Near:     Physical Exam Vitals and nursing note reviewed.  Constitutional:      General: She is not in acute  distress.    Appearance: Normal appearance. She is not ill-appearing, toxic-appearing or diaphoretic.  HENT:     Head: Normocephalic.     Nose: Nose normal.  Eyes:     Conjunctiva/sclera: Conjunctivae normal.  Pulmonary:     Effort: Pulmonary effort is normal.  Musculoskeletal:        General: Normal range of motion.     Cervical back: Normal range of motion.  Skin:    General: Skin is warm and dry.     Findings: No rash.     Comments: See picture for detail  Neurological:     Mental Status: She is alert.  Psychiatric:        Mood and Affect: Mood normal.          UC Treatments / Results  Labs (all labs ordered are listed, but only abnormal results are displayed) Labs Reviewed - No data to display  EKG   Radiology No results found.  Procedures Procedures (including critical care time)  Medications Ordered in UC Medications - No data to display  Initial Impression / Assessment and Plan / UC Course  I have reviewed the triage vital signs and the nursing notes.  Pertinent labs & imaging results that were available during my care of the patient were reviewed by me and considered in my medical decision making (see chart for details).     Diabetic  foot infection Patient with open wound to the bottom of the left great toe, drainage and toe is erythematous, swollen and foot mildly swollen. We will go ahead and treat with dual therapy at this time.  Treating with Bactrim and Keflex. Recommended close watch on the foot.  For any worsening infection she will need to go to the ER. Final Clinical Impressions(s) / UC Diagnoses   Final diagnoses:  Diabetic foot infection [facility])     Discharge Instructions     Treating you for a foot infection.  Take the antibiotics as prescribed. Go to the ER for any worsening symptoms. Otherwise you can follow-up with your primary care as needed    ED Prescriptions    Medication Sig Dispense Auth. Provider    sulfamethoxazole-trimethoprim (BACTRIM DS) 800-160 MG tablet Take 1 tablet by mouth 2 (two) times daily for 7 days. 14 tablet Emalyn Schou A, NP   cephALEXin (KEFLEX) 500 MG capsule Take 1 capsule (500 mg total) by mouth 4 (four) times daily. 28 capsule Kainoa Swoboda A, NP     PDMP not reviewed this encounter.   Janace Aris, NP 09/27/20 1603

## 2020-09-27 NOTE — ED Triage Notes (Signed)
Pt reports having a blister on L great toe. Pt sts her son squeezed the toe and blood and pus was coming from the blister. Toe is discolored. Pt sts she was walking a lot yesterday and unsure if her shoes rubbed against toe.   Bottom of toe is discolored as well.

## 2020-10-02 ENCOUNTER — Encounter: Payer: Self-pay | Admitting: Podiatry

## 2020-10-02 ENCOUNTER — Ambulatory Visit: Payer: Federal, State, Local not specified - PPO | Admitting: Podiatry

## 2020-10-02 ENCOUNTER — Other Ambulatory Visit: Payer: Self-pay

## 2020-10-02 DIAGNOSIS — E11621 Type 2 diabetes mellitus with foot ulcer: Secondary | ICD-10-CM | POA: Diagnosis not present

## 2020-10-02 DIAGNOSIS — L02612 Cutaneous abscess of left foot: Secondary | ICD-10-CM

## 2020-10-02 DIAGNOSIS — L97529 Non-pressure chronic ulcer of other part of left foot with unspecified severity: Secondary | ICD-10-CM

## 2020-10-02 DIAGNOSIS — L03032 Cellulitis of left toe: Secondary | ICD-10-CM

## 2020-10-02 MED ORDER — MUPIROCIN 2 % EX OINT: 1.0000 "application " | TOPICAL_OINTMENT | Freq: Two times a day (BID) | CUTANEOUS | 2 refills | Status: DC

## 2020-10-02 NOTE — Patient Instructions (Signed)
DRESSING CHANGES LEFT FOOT:  WEAR SURGICAL SHOE AT ALL TIMES    1.  DO NOT SOAK FOOT. SHOWERS ARE OK  2. CLEANSE ULCER WITH SALINE.  3. DAB DRY WITH GAUZE SPONGE.  4. APPLY A LIGHT AMOUNT OF MUPIROCIN OINTMENT TO BASE OF ULCER.  5. APPLY OUTER DRESSING/BAND-AID AS INSTRUCTED.  6. WEAR SURGICAL SHOE DAILY AT ALL TIMES.  7. DO NOT WALK BAREFOOT!!!  8.  IF YOU EXPERIENCE ANY FEVER, CHILLS, NIGHTSWEATS, NAUSEA OR VOMITING, ELEVATED OR LOW BLOOD SUGARS, REPORT TO EMERGENCY ROOM.  9. IF YOU EXPERIENCE INCREASED REDNESS, PAIN, SWELLING, DISCOLORATION, ODOR, PUS, DRAINAGE OR WARMTH OF YOUR FOOT, REPORT TO EMERGENCY ROOM.

## 2020-10-04 NOTE — Progress Notes (Signed)
  Subjective:  Patient ID: Betty [name], female    DOB: 02-Dec-1955,  MRN: [id number]  Chief Complaint  Patient presents with  . Foot Ulcer    Patient presents with blister/callous right hallux x 1 week.    64 y.o. female presents with the above complaint. History confirmedwith patient.  She developed a blister on the left hallux about a week ago.  Is red and swollen had bloody drainage.  Seems to be improving.  She was seen at [facility] urgent care last Friday and was given prescription for Bactrim and Keflex  Objective:  Physical Exam: warm, good capillary refill, no trophic changes or ulcerative lesions and normal DP and PT pulses.  Absent protective sensation left lower extremity.  There is blistering on the dorsal medial hallux which extends plantarly and removal of this shows a full-thickness ulceration on the plantar pulp of the toe     Assessment:   1. Diabetic ulcer of left great toe (HCC)   2. Cellulitis and abscess of toe of left foot      Plan:  Patient was evaluated and treated and all questions answered.  Ulcer left hallux -Debridement as below. -Dressed with Iodosorb, DSD. -Surgical shoe and peg assist device dispensed -Continue off-loading with surgical shoe.  Procedure: Excisional Debridement of Wound Rationale: Removal of non-viable soft tissue from the wound to promote healing.  Anesthesia: none Pre-Debridement Wound Measurements: Unmeasurable due to overlying blister Post-Debridement Wound Measurements: 3.0 cm x 3.0 cm x 0.3 cm  Type of Debridement: Sharp Excisional Tissue Removed: Non-viable soft tissue Depth of Debridement: subcutaneous tissue. Technique: Sharp excisional debridement to bleeding, viable wound base.  Dressing: Dry, sterile, compression dressing. Disposition: Patient tolerated procedure well.     Return in about 2 weeks (around 10/16/2020) for wound re-check.

## 2020-10-09 ENCOUNTER — Other Ambulatory Visit: Payer: Self-pay | Admitting: Internal Medicine

## 2020-10-16 ENCOUNTER — Ambulatory Visit: Payer: Federal, State, Local not specified - PPO | Admitting: Podiatry

## 2020-10-21 ENCOUNTER — Encounter: Payer: Self-pay | Admitting: Podiatry

## 2020-10-21 ENCOUNTER — Ambulatory Visit (INDEPENDENT_AMBULATORY_CARE_PROVIDER_SITE_OTHER): Payer: Federal, State, Local not specified - PPO | Admitting: Podiatry

## 2020-10-21 ENCOUNTER — Other Ambulatory Visit: Payer: Self-pay

## 2020-10-21 DIAGNOSIS — E11621 Type 2 diabetes mellitus with foot ulcer: Secondary | ICD-10-CM

## 2020-10-21 DIAGNOSIS — L97529 Non-pressure chronic ulcer of other part of left foot with unspecified severity: Secondary | ICD-10-CM | POA: Diagnosis not present

## 2020-10-22 NOTE — Progress Notes (Signed)
  Subjective:  Patient ID: Betty [name], female    DOB: 07-16-1956,  MRN: [id number]  Chief Complaint  Patient presents with  . Foot Ulcer    "I think its a little better"    65 y.o. female returns with the above complaint. History confirmed with patient.  Seems to be doing better.  She has been applying the mupirocin ointment.  Objective:  Physical Exam: warm, good capillary refill, no trophic changes or ulcerative lesions and normal DP and PT pulses.  Absent protective sensation left lower extremity.  Previous blistering has healed completely, small 0.3 x 0.3 x 0.1 mm ulceration plantar hallux.  There is superficial blistering of the plantar lateral skin of the hallux as well       Assessment:   1. Diabetic ulcer of left great toe [facility])      Plan:  Patient was evaluated and treated and all questions answered.  Ulcer left hallux -Debridement as below. -Dressed with Iodosorb, DSD. -Continue off-loading with surgical shoe and peg assist.  Procedure: Excisional Debridement of Wound Rationale: Removal of non-viable soft tissue from the wound to promote healing.  Anesthesia: none Pre-Debridement Wound Measurements: Unmeasurable due to overlying blister Post-Debridement Wound Measurements: 0.3 x 0.3 x 0.1 Type of Debridement: Sharp Excisional Tissue Removed: Non-viable soft tissue Depth of Debridement: subcutaneous tissue. Technique: Sharp excisional debridement to bleeding, viable wound base.  Dressing: Dry, sterile, compression dressing. Disposition: Patient tolerated procedure well.     No follow-ups on file.

## 2020-10-23 ENCOUNTER — Other Ambulatory Visit: Payer: Self-pay | Admitting: Internal Medicine

## 2020-10-29 ENCOUNTER — Telehealth: Payer: Self-pay

## 2020-10-29 NOTE — Telephone Encounter (Signed)
Inbound fax from [facility] requesting clinical notes be faxed to [phone] with release form attached.

## 2020-10-31 ENCOUNTER — Other Ambulatory Visit: Payer: Self-pay

## 2020-10-31 ENCOUNTER — Other Ambulatory Visit: Payer: Self-pay | Admitting: Internal Medicine

## 2020-10-31 DIAGNOSIS — E1165 Type 2 diabetes mellitus with hyperglycemia: Secondary | ICD-10-CM

## 2020-10-31 DIAGNOSIS — IMO0002 Reserved for concepts with insufficient information to code with codable children: Secondary | ICD-10-CM

## 2020-10-31 MED ORDER — INSULIN NPH (HUMAN) (ISOPHANE) 100 UNIT/ML ~~LOC~~ SUSP: SUBCUTANEOUS | 0 refills | Status: DC

## 2020-11-04 ENCOUNTER — Other Ambulatory Visit: Payer: Self-pay | Admitting: Internal Medicine
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
## 2020-11-08 ENCOUNTER — Ambulatory Visit: Payer: Federal, State, Local not specified - PPO | Admitting: Cardiovascular Disease

## 2020-11-13 ENCOUNTER — Ambulatory Visit: Payer: Federal, State, Local not specified - PPO | Admitting: Podiatry

## 2020-11-18 ENCOUNTER — Ambulatory Visit: Payer: Federal, State, Local not specified - PPO | Admitting: Podiatry

## 2020-11-20 ENCOUNTER — Ambulatory Visit: Payer: Federal, State, Local not specified - PPO | Admitting: Podiatry

## 2020-11-27 ENCOUNTER — Ambulatory Visit: Payer: Federal, State, Local not specified - PPO | Admitting: Podiatry

## 2020-11-27 ENCOUNTER — Encounter: Payer: Self-pay | Admitting: Podiatry

## 2020-11-27 ENCOUNTER — Other Ambulatory Visit: Payer: Self-pay

## 2020-11-27 DIAGNOSIS — E11621 Type 2 diabetes mellitus with foot ulcer: Secondary | ICD-10-CM

## 2020-11-27 DIAGNOSIS — L97529 Non-pressure chronic ulcer of other part of left foot with unspecified severity: Secondary | ICD-10-CM

## 2020-11-27 DIAGNOSIS — E0843 Diabetes mellitus due to underlying condition with diabetic autonomic (poly)neuropathy: Secondary | ICD-10-CM

## 2020-12-01 ENCOUNTER — Encounter: Payer: Self-pay | Admitting: Podiatry

## 2020-12-01 NOTE — Progress Notes (Signed)
  Subjective:  Patient ID: Betty [name], female    DOB: [birth year],  MRN: [id number]  Chief Complaint  Patient presents with  . Diabetic Ulcer    "its about the same.  I pulled a black piece of skin off 2 days ago"    65 y.o. female returns with the above complaint. History confirmed with patient.  Here with her son today.  Seems to be doing better.  The blistered area turned into a blood patch which peeled off.  She has been applying the mupirocin ointment.  Objective:  Physical Exam: warm, good capillary refill, no trophic changes or ulcerative lesions and normal DP and PT pulses.  Absent protective sensation left lower extremity.  Previous blistering has healed completely, small 0.5 x 0.5 x 0.1 cm ulceration plantar hallux.        Assessment:   1. Diabetic ulcer of left great toe (HCC)   2. Diabetes mellitus due to underlying condition with diabetic autonomic neuropathy, unspecified whether long term insulin use (HCC)      Plan:  Patient was evaluated and treated and all questions answered.  Ulcer left hallux -Debridement as below. -Dressed with Iodosorb, DSD. -Continue off-loading with surgical shoe and peg assist.  Procedure: Excisional Debridement of Wound Rationale: Removal of non-viable soft tissue from the wound to promote healing.  Anesthesia: none Pre-Debridement Wound Measurements: Unmeasurable due to overlying blister Post-Debridement Wound Measurements: 0.5 x 0.5 x 0.1 cm Type of Debridement: Sharp Excisional Tissue Removed: Non-viable soft tissue Depth of Debridement: subcutaneous tissue. Technique: Sharp excisional debridement to bleeding, viable wound base.  Dressing: Dry, sterile, compression dressing. Disposition: Patient tolerated procedure well.     Return in about 3 weeks (around 12/18/2020) for wound re-check.

## 2020-12-05 ENCOUNTER — Telehealth: Payer: Self-pay | Admitting: Cardiovascular Disease

## 2020-12-05 NOTE — Telephone Encounter (Signed)
Patient called and stated that she had 3 teeth pulled. Patient was put on 800 mg ibuprofin and also hydrocodone. Wants to know if this is ok to take since Dr. Eden Emms did not want patient to take NSAIDS. Please call back

## 2020-12-05 NOTE — Telephone Encounter (Signed)
Called patient back with Dr. Eden Emms recommendations.

## 2020-12-05 NOTE — Telephone Encounter (Signed)
Ok to take short term < 5days

## 2020-12-07 ENCOUNTER — Other Ambulatory Visit: Payer: Self-pay | Admitting: Internal Medicine

## 2020-12-07 DIAGNOSIS — IMO0002 Reserved for concepts with insufficient information to code with codable children: Secondary | ICD-10-CM

## 2020-12-07 DIAGNOSIS — E1165 Type 2 diabetes mellitus with hyperglycemia: Secondary | ICD-10-CM

## 2020-12-18 ENCOUNTER — Ambulatory Visit: Payer: Federal, State, Local not specified - PPO | Admitting: Podiatry

## 2020-12-25 ENCOUNTER — Ambulatory Visit: Payer: Federal, State, Local not specified - PPO | Admitting: Podiatry

## 2020-12-30 ENCOUNTER — Ambulatory Visit: Payer: Federal, State, Local not specified - PPO | Admitting: Podiatry

## 2021-01-01 ENCOUNTER — Ambulatory Visit: Payer: Federal, State, Local not specified - PPO | Admitting: Podiatry

## 2021-01-08 ENCOUNTER — Ambulatory Visit (INDEPENDENT_AMBULATORY_CARE_PROVIDER_SITE_OTHER): Payer: Medicare Other | Admitting: Podiatry

## 2021-01-08 ENCOUNTER — Other Ambulatory Visit: Payer: Self-pay

## 2021-01-08 ENCOUNTER — Encounter: Payer: Self-pay | Admitting: Podiatry

## 2021-01-08 DIAGNOSIS — M205X2 Other deformities of toe(s) (acquired), left foot: Secondary | ICD-10-CM | POA: Diagnosis not present

## 2021-01-08 DIAGNOSIS — E0843 Diabetes mellitus due to underlying condition with diabetic autonomic (poly)neuropathy: Secondary | ICD-10-CM | POA: Diagnosis not present

## 2021-01-08 DIAGNOSIS — L600 Ingrowing nail: Secondary | ICD-10-CM | POA: Diagnosis not present

## 2021-01-08 DIAGNOSIS — M2032 Hallux varus (acquired), left foot: Secondary | ICD-10-CM

## 2021-01-08 DIAGNOSIS — E11621 Type 2 diabetes mellitus with foot ulcer: Secondary | ICD-10-CM | POA: Diagnosis not present

## 2021-01-08 DIAGNOSIS — L97529 Non-pressure chronic ulcer of other part of left foot with unspecified severity: Secondary | ICD-10-CM

## 2021-01-08 NOTE — Progress Notes (Signed)
  Subjective:  Patient ID: Betty [name], female    DOB: 16-Jul-1956,  MRN: [id number]  Chief Complaint  Patient presents with  . Foot Ulcer    "its doing much better"    65 y.o. female returns with the above complaint. History confirmed with patient.  She is doing better and she thinks it is healed.  The right hallux is ingrowing and causing pain.  Objective:  Physical Exam: warm, good capillary refill, no trophic changes or ulcerative lesions and normal DP and PT pulses.  Absent protective sensation left lower extremity.  Ulcer has healed       Assessment:   1. Ingrowing right great toenail   2. Diabetic ulcer of left great toe (HCC)   3. Diabetes mellitus due to underlying condition with diabetic autonomic neuropathy, unspecified whether long term insulin use (HCC)   4. Hallux malleus of left foot   5. Hallux limitus of left foot      Plan:  Patient was evaluated and treated and all questions answered.  Ulcer left hallux -Ulcer has healed -Debrided overlying callus -Long-term with her deformity and history of ulceration I think should benefit best from extra-depth diabetic shoes with custom molded multidensity inserts to prevent recurrence.  -Right hallux nail with incurvated borders was debrided to alleviate the offending corners   Return in about 4 weeks (around 02/05/2021) for wound re-check.

## 2021-01-09 ENCOUNTER — Ambulatory Visit (INDEPENDENT_AMBULATORY_CARE_PROVIDER_SITE_OTHER): Payer: Medicare Other | Admitting: Internal Medicine

## 2021-01-09 ENCOUNTER — Encounter: Payer: Self-pay | Admitting: Internal Medicine

## 2021-01-09 VITALS — BP 128/88 | HR 79 | Ht 66.0 in | Wt 276.8 lb

## 2021-01-09 DIAGNOSIS — E669 Obesity, unspecified: Secondary | ICD-10-CM | POA: Diagnosis not present

## 2021-01-09 DIAGNOSIS — E1165 Type 2 diabetes mellitus with hyperglycemia: Secondary | ICD-10-CM

## 2021-01-09 DIAGNOSIS — E1142 Type 2 diabetes mellitus with diabetic polyneuropathy: Secondary | ICD-10-CM

## 2021-01-09 DIAGNOSIS — E785 Hyperlipidemia, unspecified: Secondary | ICD-10-CM

## 2021-01-09 DIAGNOSIS — Z794 Long term (current) use of insulin: Secondary | ICD-10-CM

## 2021-01-09 DIAGNOSIS — E118 Type 2 diabetes mellitus with unspecified complications: Secondary | ICD-10-CM | POA: Diagnosis not present

## 2021-01-09 DIAGNOSIS — IMO0002 Reserved for concepts with insufficient information to code with codable children: Secondary | ICD-10-CM

## 2021-01-09 LAB — MICROALBUMIN / CREATININE URINE RATIO
Creatinine,U: 30.9 mg/dL
Microalb Creat Ratio: 2.3 mg/g (ref 0.0–30.0)
Microalb, Ur: <0.7 mg/dL (ref 0.0–1.9)

## 2021-01-09 LAB — COMPREHENSIVE METABOLIC PANEL
ALT: 24 U/L (ref 0–35)
AST: 24 U/L (ref 0–37)
Albumin: 4 g/dL (ref 3.5–5.2)
Alkaline Phosphatase: 74 U/L (ref 39–117)
BUN: 14 mg/dL (ref 6–23)
CO2: 30 mEq/L (ref 19–32)
Calcium: 9.4 mg/dL (ref 8.4–10.5)
Chloride: 103 mEq/L (ref 96–112)
Creatinine, Ser: 0.72 mg/dL (ref 0.40–1.20)
GFR: 87.32 mL/min (ref >60.00)
Glucose, Bld: 146 mg/dL — ABNORMAL HIGH (ref 70–99)
Potassium: 4.1 mEq/L (ref 3.5–5.1)
Sodium: 139 mEq/L (ref 135–145)
Total Bilirubin: 0.5 mg/dL (ref 0.2–1.2)
Total Protein: 7.3 g/dL (ref 6.0–8.3)

## 2021-01-09 LAB — POCT GLYCOSYLATED HEMOGLOBIN (HGB A1C): Hemoglobin A1C: 7.5 % — AB (ref 4.0–5.6)

## 2021-01-09 LAB — LIPID PANEL
Cholesterol: 103 mg/dL (ref 0–200)
HDL: 41.7 mg/dL (ref >39.00)
LDL Cholesterol: 39 mg/dL (ref 0–99)
NonHDL: 61.03
Total CHOL/HDL Ratio: 2
Triglycerides: 110 mg/dL (ref 0.0–149.0)
VLDL: 22 mg/dL (ref 0.0–40.0)

## 2021-01-09 NOTE — Patient Instructions (Addendum)
  Please continue: - Metformin XR 500 mg 2x a day - Ozempic 1 mg weekly  Breakfast  Lunch  Dinner bedtime  Insulin N: 36 units -  - 36 units  Insulin R: 22 units with a  smaller meal                 24 units wiith a  regular meal 22 units with a smaller meal 24 units with a larger meal 22 units with a smaller meal 24 units with a larger meal  -  For sugars: 71-90: take 16 units of R insulin 50-70: do not take R insulin  Please stop at the lab.  Please return in 4 months with your meter.

## 2021-01-09 NOTE — Progress Notes (Signed)
Patient ID: Betty [name], female   DOB: 1955-10-24, 65 y.o.   MRN: [id number]  This visit occurred during the SARS-CoV-2 public health emergency.  Safety protocols were in place, including screening questions prior to the visit, additional usage of staff PPE, and extensive cleaning of exam room while observing appropriate contact time as indicated for disinfecting solutions.   HPI: Betty [name] is a 65 y.o. female, returning for f/u for DM2, dx >10 years ago, insulin-dependent, uncontrolled, with complications (CAD - s/p stent, peripheral neuropathy, DR). Last visit 9 months ago.  Interim history: At last visit, she had an avulsed nail and I referred her to podiatry.  This healed but she developed a diabetic bulla under a callus (L toe) and infection which is was treated with Bactrim and Keflex. She had extensive dental work since last OV  - 11/2020. She is not sleeping well.  She feels tired.  However, she feels better, less stressed, after she retired in 06/2020.  Reviewed HbA1c levels: Lab Results  Component Value Date   HGBA1C 7.7 (A) 04/19/2020   HGBA1C 8.0 (A) 09/21/2019   HGBA1C 7.3 (A) 08/22/2018   Pt is on a regimen of: - Metformin XR 500 mg 3 >> 2 x a day - Victoza 1.2 mg daily >> Ozempic 0.5 >> 1 mg weekly  Breakfast  Lunch  Dinner bedtime  Insulin N: 36 units -  - 36 units  Insulin R: 22 units with a  smaller meal                 24 units wiith a  regular meal 22 units with a smaller meal 24 units with a larger meal 22 units with a smaller meal 24 units with a larger meal  -  For sugars: 71-90: take 16 units of R insulin 50-70: do not take R insulin  We tried Bydureon 2 mg weekly (added 08/2015) >> not covered Was previously on 70/30 Insulin: 70-20-30 (split in 3 b/c she was dropping her sugars overnight). On these, she was still having lows in am (40s). She was on Novolog in the past but could not afford it.  She was also on Amaryl and Avandia. She  has bladder dysfxn - cannot try SGLT2 inh.  She had diarrhea with regular metformin.  Meter: [place]  Pt checks her sugars 2x a day: - am: 111-130s, 200 >> 109-138 >> 94, 106-169, 192, 201 and 238 (missed meds) - 2h after b'fast:  >> 145, 215 >> n/c >> 139-182, 219 - before lunch:  84-160, 242 >> 101, 124 >> 110-140 >> 112-159, 202 - 2h after lunch: 151, 199 >> n/c >> 109, 184 >> n/c >> 156 - before dinner:92-160, 192 >> 131, 178 >> 90-120s, 200 >> 168 - bedtime: n/c >> 114, 160 >> 84, 122 >> n/c Lowest CBG: 60 >> 70 >> 70-but not frequently >> 90s; she has hypoglycemia awareness in the 80s  No CKD, last BUN/creatinine was:  Lab Results  Component Value Date   BUN 15 09/29/2019   Lab Results  Component Value Date   CREATININE 0.69 09/29/2019  On ramipril.  + HL; Last set of lipids: Lab Results  Component Value Date   CHOL 107 12/11/2019   HDL 43 12/11/2019   LDLCALC 41 12/11/2019   LDLDIRECT 48 12/11/2019   TRIG 131 12/11/2019   CHOLHDL 2.5 12/11/2019  Prev. on Pravachol and she is not sure why she is not taking this anymore.  She was  on Lipitor >> had mm cramps.  On fish oil.  Now also on Crestor 10.  She is also on Vascepa 2 g twice a day. - eye exam: 04/2020: + DR, + glaucoma, + cataracts. Dr Senaida Ores -+ Numbness and tingling in her legs.  She was previously on Lyrica but had to stop due to somnolence.  She also had somnolence with the 300 mg Neurontin tablets >> then 100 mg daily >> then stopped -now back on it.  She sees neurology (for  vertigo). Sees Dr Yetta Barre (podiatrist).   She also has a history of HTN, OSA, obesity, OA - on Osteobiflex, vitamin D deficiency.  She developed severe nausea and vertigo on 04/10/2018. She was on Reglan (per Dr. Russella Dar) that helped with the nausea >> now off >> she was out of work because of this for a period of time.  She contacted our office and  I advised her to stop Victoza but her vertigo did not improve so we restarted the dose up.  Of  note, brain MRI was negative for a stroke. She had a stress test with Dr. Eden Emms as she had CP with exertion >> positive >> had a heart cath >> had a stent placed.  She was restarted on a statin afterwards.  ROS: + See HPI Constitutional: no weight gain/no weight loss, + fatigue, no subjective hyperthermia, no subjective hypothermia Eyes: no blurry vision, no xerophthalmia ENT: no sore throat, no nodules palpated in neck, no dysphagia, no odynophagia, no hoarseness Cardiovascular: no CP/no SOB/no palpitations/no leg swelling Respiratory: no cough/no SOB/no wheezing Gastrointestinal: no N/V/D/C/no acid reflux Musculoskeletal: no muscle aches/no joint aches Skin: no rashes, no hair loss, + callus under L toe Neurological: no tremors/+ numbness/+ tingling/no dizziness  I reviewed pt's medications, allergies, PMH, social hx, family hx, and changes were documented in the history of present illness. Otherwise, unchanged from my initial visit note.   Past Medical History:  Diagnosis Date  . Arthritis   . CAD, NATIVE VESSEL    a. S/p LAD stent in 2006 b. S/p DES to Lcx 08/2019 and patent LAD stent  . DM (diabetes mellitus) (HCC)   . Fibromyalgia   . HYPERLIPIDEMIA-MIXED   . HYPERTENSION, BENIGN   . Neuropathy   . Obesity   . Osteoarthritis   . Sleep apnea    Patient reported on 04/12/13  . Vertigo    Past Surgical History:  Procedure Laterality Date  . BREATH TEK H PYLORI N/A 03/08/2013   Procedure: BREATH TEK H PYLORI;  Surgeon: Atilano Ina, MD;  Location: [facility];  Service: General;  Laterality: N/A;  . CESAREAN SECTION    . CHOLECYSTECTOMY  1996  . CORONARY ANGIOPLASTY WITH STENT PLACEMENT  11/2004   DES - LAD  . CORONARY STENT INTERVENTION N/A 09/06/2019   Procedure: CORONARY STENT INTERVENTION;  Surgeon: Tonny Bollman, MD;  Location: [facility] INVASIVE CV LAB;  Service: Cardiovascular;  Laterality: N/A;  . CORONARY STENT PLACEMENT  11/2004  . RIGHT/LEFT HEART CATH AND CORONARY  ANGIOGRAPHY N/A 09/06/2019   Procedure: RIGHT/LEFT HEART CATH AND CORONARY ANGIOGRAPHY;  Surgeon: Tonny Bollman, MD;  Location: [facility] INVASIVE CV LAB;  Service: Cardiovascular;  Laterality: N/A;   Social History   Socioeconomic History  . Marital status: Divorced    Spouse name: Not on file  . Number of children: 2  . Years of education: 31  . Highest education level: High school graduate  Occupational History  . 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 Smoking status: Never Smoker  . Smokeless tobacco: Never Used  Vaping Use  . Vaping Use: Never used  Substance and Sexual Activity  . Alcohol use: No  . Drug use: No  . Sexual activity: Not on file  Other Topics Concern  . Not on file  Social History Narrative   Lives alone.   Right-handed.   Caffeine use:  She will sometimes drink up to three 20-oz bottles of soda per day.  Drinks 0.5 bottles of a 5-hour energy drink daily.   Social Determinants of Health   Financial Resource Strain: Not on file  Food Insecurity: Not on file  Transportation Needs: Not on file  Physical Activity: Not on file  Stress: Not on file  Social Connections: Not on file  Intimate Partner Violence: Not on file   Current Outpatient Medications on File Prior to Visit  Medication Sig Dispense Refill  . acetaminophen (TYLENOL ARTHRITIS PAIN) 650 MG CR tablet Take 650-1,300 mg by mouth every 8 (eight) hours as needed for pain.     . Aromatic Inhalants (VICKS VAPOINHALER IN) Place 1 Dose into both nostrils as needed (congestion.).    Marland Kitchen aspirin EC 81 MG tablet Take 81 mg by mouth daily.    . BD PEN NEEDLE NANO U/F 32G X 4 MM MISC USE ONCE DAILY 100 each 11  . BD VEO INSULIN SYRINGE U/F 31G X 15/64" 1 ML MISC USE 1 NEEDLE 4 TIMES DAILY AS ADVISED 300 each 3  . beta carotene 44010 UNIT capsule Take 25,000 Units by mouth daily.      . Calcium Carbonate-Vitamin D (CALCIUM-VITAMIN D) 600-200 MG-UNIT CAPS Take 1 tablet by mouth every evening.     .  carvedilol (COREG) 3.125 MG tablet TAKE 1 TABLET BY MOUTH TWICE DAILY WITH A MEAL 180 tablet 1  . cephALEXin (KEFLEX) 500 MG capsule Take 1 capsule (500 mg total) by mouth 4 (four) times daily. 28 capsule 0  . cetirizine (ZYRTEC) 10 MG tablet Take 10 mg by mouth at bedtime.     . Cholecalciferol (VITAMIN D3) 250 MCG (10000 UT) capsule Take 10,000 Units by mouth daily.    . clopidogrel (PLAVIX) 75 MG tablet TAKE 1 TABLET BY MOUTH DAILY 90 tablet 3  . diphenhydrAMINE (BENADRYL) 25 mg capsule Take 25 mg by mouth every 6 (six) hours as needed for itching.    . gabapentin (NEURONTIN) 100 MG capsule TAKE 1 CAPSULE BY MOUTH EACH NIGHT AT BEDTIME 30 capsule 4  . Homeopathic Products (THERAWORX RELIEF EX) Apply 1 application topically 3 (three) times daily as needed (pain.).    Marland Kitchen icosapent Ethyl (VASCEPA) 1 g capsule Take 2 capsules (2 g total) by mouth 2 (two) times daily. 120 capsule 11  . Magnesium 250 MG TABS Take 250 mg by mouth daily.     . meloxicam (MOBIC) 7.5 MG tablet Take 7.5 mg by mouth daily.    . Menthol, Topical Analgesic, (ZIMS MAX-FREEZE EX) Apply 1 application topically 4 (four) times daily as needed (pain).    . metFORMIN (GLUCOPHAGE-XR) 500 MG 24 hr tablet Take 1 tablet (500 mg total) by mouth 3 (three) times daily with meals. Give w/food. 270 tablet 3  . Misc Natural Products (OSTEO BI-FLEX JOINT SHIELD PO) Take 1 tablet by mouth daily.      . mupirocin ointment (BACTROBAN) 2 % Apply 1 application topically 2 (two) times daily. 30 g 2  . nitroGLYCERIN (NITROSTAT) 0.4 MG SL tablet Place  1 tablet (0.4 mg total) under the tongue every 5 (five) minutes as needed for chest pain. (Patient not taking: Reported on 04/19/2020) 25 tablet 3  . NOVOLIN N RELION 100 UNIT/ML injection INJECT 36 UNITS SUBCUTANEOUSLY TWICE DAILY 30 mL 0  . NOVOLIN R RELION 100 UNIT/ML injection USE UP TO 100 UNITS A DAY 30 mL 2  . ondansetron (ZOFRAN) 4 MG tablet Take 4 mg by mouth every 8 (eight) hours as needed for  nausea or vomiting.     Marland Kitchen. Pumpkin Seed-Soy Germ (AZO BLADDER CONTROL/GO-LESS PO) Take 1 each by mouth 2 (two) times daily.     . ramipril (ALTACE) 10 MG capsule TAKE 1 CAPSULE BY MOUTH DAILY 90 capsule 1  . rosuvastatin (CRESTOR) 10 MG tablet TAKE 1 TABLET DAILY 90 tablet 3  . Semaglutide, 1 MG/DOSE, (OZEMPIC, 1 MG/DOSE,) 4 MG/3ML SOPN Inject 0.75 mLs (1 mg total) into the skin once a week. 9 mL 3  . ULTRATRAK PRO TEST test strip TEST BLOOD SUGAR 5 TIMES DAILY AS INSTRUCTED 450 each 1   No current facility-administered medications on file prior to visit.   Allergies  Allergen Reactions  . Lyrica [Pregabalin] Other (See Comments)    sleepy   Family History  Problem Relation Age of Onset  . Heart disease Mother   . Cancer Mother        ovarian  . Arthritis Mother   . Diabetes Mother   . Hypertension Mother   . Hypertension Father   . Stroke Father   . Diabetes Sister   . Cancer Other   . Coronary artery disease Other   . Diabetes Other     PE: BP 128/88 (BP Location: Right Wrist, Patient Position: Sitting, Cuff Size: Normal)   Pulse 79   Ht 5\' 6"  (1.676 m)   Wt 276 lb 12.8 oz (125.6 kg)   SpO2 93%   BMI 44.68 kg/m  Body mass index is 44.68 kg/m. Wt Readings from Last 3 Encounters:  01/09/21 276 lb 12.8 oz (125.6 kg)  09/27/20 274 lb 7.6 oz (124.5 kg)  04/19/20 274 lb 8 oz (124.5 kg)   Constitutional: overweight, in NAD Eyes: PERRLA, EOMI, no exophthalmos ENT: moist mucous membranes, no thyromegaly, no cervical lymphadenopathy Cardiovascular: RRR, No MRG Respiratory: CTA B Gastrointestinal: abdomen soft, NT, ND, BS+ Musculoskeletal: no deformities, strength intact in all 4 Skin: moist, warm, no rashes Neurological: no tremor with outstretched hands, DTR normal in all 4  ASSESSMENT: 1. DM2, insulin-dependent, uncontrolled, with complications - CAD - s/p stent 11/2004 - Dr Eden EmmsNishan - peripheral neuropathy - DR She told me that her previous endocrinologist filled  out FMLA forms 2/2 uncontrolled DM2, but I could not fill this since no major highs or lows or other issues.   2. PN  3. HL  4.  Obesity class III  5.  Avulsed nail  PLAN:  1. DM2 Patient with longstanding, insulin-dependent type 2 diabetes, on basal/bolus insulin regimen and also Metformin and weekly GLP-1 receptor agonist, with improved control after switching from Victoza to Ozempic.  At last visit, HbA1c was 7.7%, slightly better but she did not bring a log and she could not remember her blood sugars well.  Based on her HbA1c, sugars were still above target and I suggested to increase the Ozempic dose to 1 mg weekly but we did not change her insulin doses.  She did have some nausea at that time which was present before starting Ozempic and she did  not feel that this is related to the medication. -At today's visit, sugars are mostly in target range, with few hyperglycemic exceptions.  She had several readings in the 200s when she missed her medicines, but the rest of the blood sugars are fairly well controlled and, importantly, without hypoglycemia.  We discussed about the importance of not forgetting the insulin doses, but otherwise, I do not absolutely feel that we need to change her regimen for now.  She tells me that she cannot take the Metformin 3 times a day due to diarrhea, so we will continue twice a day, which she tolerates well. - I advised her to: Patient Instructions   Please continue: - Metformin XR 500 mg 2x a day - Ozempic 1 mg weekly  Breakfast  Lunch  Dinner bedtime  Insulin N: 36 units -  - 36 units  Insulin R: 22 units with a  smaller meal                 24 units wiith a  regular meal 22 units with a smaller meal 24 units with a larger meal 22 units with a smaller meal 24 units with a larger meal  -  For sugars: 71-90: take 16 units of R insulin 50-70: do not take R insulin  Please return in 4 months with your sugar log or meter.  - we checked her HbA1c: 7.5%  (slightly better) - advised to check sugars at different times of the day - 4x a day, rotating check times - advised for yearly eye exams >> she is UTD - will check annual labs today - return to clinic in 4 months   2. PN -Stable -She previously had somnolence with a higher dose of Neurontin, 300 mg daily, and now is taking 100 mg daily  3. HL -Latest lipid panel was excellent: Lab Results  Component Value Date   CHOL 107 12/11/2019   HDL 43 12/11/2019   LDLCALC 41 12/11/2019   LDLDIRECT 48 12/11/2019   TRIG 131 12/11/2019   CHOLHDL 2.5 12/11/2019  -She previously had muscle aches with Lipitor.  She then tried pravastatin but stopped for an unknown reason.  She was started on Crestor 10 mg daily before last visit and she tolerates this well.  She is now also on Vascepa 2 g twice a day.  4.  Obesity class III -We will continue GLP-1 receptor agonist which should also help with weight loss -She lost 11 pounds before last visit and gained 2 pounds since then   Component     Latest Ref Rng & Units 01/09/2021          Sodium     135 - 145 mEq/L 139  Potassium     3.5 - 5.1 mEq/L 4.1  Chloride     96 - 112 mEq/L 103  CO2     19 - 32 mEq/L 30  Glucose     70 - 99 mg/dL 932 (H)  BUN     6 - 23 mg/dL 14  Creatinine     6.71 - 1.20 mg/dL 2.45  Total Bilirubin     0.2 - 1.2 mg/dL 0.5  Alkaline Phosphatase     39 - 117 U/L 74  AST     0 - 37 U/L 24  ALT     0 - 35 U/L 24  Total Protein     6.0 - 8.3 g/dL 7.3  Albumin     3.5 - 5.2  g/dL 4.0  GFR     >21.30 mL/min 87.32  Calcium     8.4 - 10.5 mg/dL 9.4  Cholesterol     0 - 200 mg/dL 865  Triglycerides     0.0 - 149.0 mg/dL 784.6  HDL Cholesterol     >39.00 mg/dL 96.29  VLDL     0.0 - 52.8 mg/dL 41.3  LDL (calc)     0 - 99 mg/dL 39  Total CHOL/HDL Ratio      2  NonHDL      61.03  Microalb, Ur     0.0 - 1.9 mg/dL <2.4  Creatinine,U     mg/dL 40.1  MICROALB/CREAT RATIO     0.0 - 30.0 mg/g 2.3   Labs  are at goal with exception of a slightly high glucose.  Carlus Pavlov, MD PhD [facility]

## 2021-01-13 ENCOUNTER — Other Ambulatory Visit: Payer: Self-pay | Admitting: Internal Medicine

## 2021-01-13 DIAGNOSIS — E1165 Type 2 diabetes mellitus with hyperglycemia: Secondary | ICD-10-CM

## 2021-01-13 DIAGNOSIS — IMO0002 Reserved for concepts with insufficient information to code with codable children: Secondary | ICD-10-CM
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
## 2021-01-22 ENCOUNTER — Ambulatory Visit: Payer: Federal, State, Local not specified - PPO

## 2021-01-27 ENCOUNTER — Ambulatory Visit: Payer: Federal, State, Local not specified - PPO | Admitting: Cardiovascular Disease

## 2021-02-03 ENCOUNTER — Other Ambulatory Visit: Payer: Self-pay | Admitting: Internal Medicine

## 2021-02-05 ENCOUNTER — Ambulatory Visit: Payer: Federal, State, Local not specified - PPO | Admitting: Podiatry

## 2021-02-10 ENCOUNTER — Ambulatory Visit: Payer: Federal, State, Local not specified - PPO | Admitting: Podiatry

## 2021-02-12 ENCOUNTER — Ambulatory Visit: Payer: Federal, State, Local not specified - PPO | Admitting: Podiatry

## 2021-02-18 ENCOUNTER — Other Ambulatory Visit: Payer: Self-pay | Admitting: Internal Medicine

## 2021-02-18 DIAGNOSIS — IMO0002 Reserved for concepts with insufficient information to code with codable children: Secondary | ICD-10-CM

## 2021-02-18 DIAGNOSIS — E1165 Type 2 diabetes mellitus with hyperglycemia: Secondary | ICD-10-CM

## 2021-02-19 ENCOUNTER — Ambulatory Visit: Payer: Federal, State, Local not specified - PPO

## 2021-02-24 ENCOUNTER — Ambulatory Visit: Payer: Federal, State, Local not specified - PPO | Admitting: Podiatry

## 2021-02-26 ENCOUNTER — Other Ambulatory Visit: Payer: Self-pay | Admitting: Cardiovascular Disease

## 2021-03-12 ENCOUNTER — Ambulatory Visit: Payer: Federal, State, Local not specified - PPO | Admitting: Podiatry

## 2021-03-18 ENCOUNTER — Other Ambulatory Visit: Payer: Self-pay | Admitting: Internal Medicine

## 2021-03-18 DIAGNOSIS — IMO0002 Reserved for concepts with insufficient information to code with codable children: Secondary | ICD-10-CM

## 2021-03-18 DIAGNOSIS — E1165 Type 2 diabetes mellitus with hyperglycemia: Secondary | ICD-10-CM

## 2021-03-19 ENCOUNTER — Ambulatory Visit: Payer: Federal, State, Local not specified - PPO

## 2021-03-24 ENCOUNTER — Ambulatory Visit: Payer: Medicare Other | Admitting: Podiatry

## 2021-03-30 ENCOUNTER — Other Ambulatory Visit: Payer: Self-pay | Admitting: Internal Medicine

## 2021-03-30 DIAGNOSIS — IMO0002 Reserved for concepts with insufficient information to code with codable children: Secondary | ICD-10-CM

## 2021-04-01 NOTE — Telephone Encounter (Signed)
Patient called and requested this RX be sent with refills.  Patient is out of this medication - took last does 03/30/21

## 2021-04-02 ENCOUNTER — Other Ambulatory Visit: Payer: Self-pay

## 2021-04-02 ENCOUNTER — Encounter: Payer: Self-pay | Admitting: Podiatry

## 2021-04-02 ENCOUNTER — Ambulatory Visit (INDEPENDENT_AMBULATORY_CARE_PROVIDER_SITE_OTHER): Payer: Medicare Other | Admitting: Podiatry

## 2021-04-02 ENCOUNTER — Other Ambulatory Visit: Payer: Self-pay | Admitting: Internal Medicine

## 2021-04-02 DIAGNOSIS — IMO0002 Reserved for concepts with insufficient information to code with codable children: Secondary | ICD-10-CM

## 2021-04-02 DIAGNOSIS — M79675 Pain in left toe(s): Secondary | ICD-10-CM | POA: Diagnosis not present

## 2021-04-02 DIAGNOSIS — B351 Tinea unguium: Secondary | ICD-10-CM | POA: Diagnosis not present

## 2021-04-02 DIAGNOSIS — E0843 Diabetes mellitus due to underlying condition with diabetic autonomic (poly)neuropathy: Secondary | ICD-10-CM

## 2021-04-02 DIAGNOSIS — M79674 Pain in right toe(s): Secondary | ICD-10-CM

## 2021-04-07 ENCOUNTER — Encounter: Payer: Self-pay | Admitting: Podiatry

## 2021-04-07 NOTE — Progress Notes (Signed)
  Subjective:  Patient ID: Betty [name], female    DOB: August 16, 1956,  MRN: [id number]  Chief Complaint  Patient presents with   Foot Ulcer    "Its callused over.  I want him to check it and clip my nails on the right foot"    65 y.o. female returns with the above complaint. History confirmed with patient.  Wound remains healed her nails are becoming painful for her  Objective:  Physical Exam: warm, good capillary refill, no trophic changes or ulcerative lesions and normal DP and PT pulses.  Absent protective sensation left lower extremity.  Ulcer remains healed.  Thickened elongated toenails with yellow discoloration and subungual debris       Assessment:   1. Pain due to onychomycosis of toenails of both feet   2. Diabetes mellitus due to underlying condition with diabetic autonomic neuropathy, unspecified whether long term insulin use (HCC)       Plan:  Patient was evaluated and treated and all questions answered.  Patient educated on diabetes. Discussed proper diabetic foot care and discussed risks and complications of disease. Educated patient in depth on reasons to return to the office immediately should he/she discover anything concerning or new on the feet. All questions answered. Discussed proper shoes as well.   Discussed the etiology and treatment options for the condition in detail with the patient. Educated patient on the topical and oral treatment options for mycotic nails. Recommended debridement of the nails today. Sharp and mechanical debridement performed of all painful and mycotic nails today. Nails debrided in length and thickness using a nail nipper to level of comfort. Discussed treatment options including appropriate shoe gear. Follow up as needed for painful nails.     Return in about 3 months (around 07/03/2021) for at risk diabetic foot care.

## 2021-04-08 ENCOUNTER — Ambulatory Visit: Payer: Federal, State, Local not specified - PPO | Admitting: Physician Assistant

## 2021-05-04 ENCOUNTER — Other Ambulatory Visit: Payer: Self-pay | Admitting: Internal Medicine

## 2021-05-19 ENCOUNTER — Ambulatory Visit: Payer: Federal, State, Local not specified - PPO | Admitting: Podiatry

## 2021-05-29 ENCOUNTER — Ambulatory Visit: Payer: Federal, State, Local not specified - PPO | Admitting: Internal Medicine

## 2021-06-11 ENCOUNTER — Other Ambulatory Visit: Payer: Self-pay

## 2021-06-11 ENCOUNTER — Emergency Department
Admission: EM | Admit: 2021-06-11 | Discharge: 2021-06-11 | Disposition: A | Discharge status: Home / Self Care | Payer: Medicare Other | Attending: Emergency Medicine | Admitting: Emergency Medicine

## 2021-06-11 ENCOUNTER — Emergency Department: Payer: Medicare Other

## 2021-06-11 DIAGNOSIS — Z5321 Procedure and treatment not carried out due to patient leaving prior to being seen by health care provider: Secondary | ICD-10-CM | POA: Diagnosis not present

## 2021-06-11 DIAGNOSIS — M79661 Pain in right lower leg: Secondary | ICD-10-CM | POA: Diagnosis not present

## 2021-06-11 DIAGNOSIS — R791 Abnormal coagulation profile: Secondary | ICD-10-CM | POA: Diagnosis not present

## 2021-06-11 DIAGNOSIS — M79662 Pain in left lower leg: Secondary | ICD-10-CM | POA: Diagnosis not present

## 2021-06-11 DIAGNOSIS — R531 Weakness: Secondary | ICD-10-CM | POA: Insufficient documentation

## 2021-06-11 LAB — COMPREHENSIVE METABOLIC PANEL
ALT: 35 U/L (ref 0–44)
AST: 37 U/L (ref 15–41)
Albumin: 3.9 g/dL (ref 3.5–5.0)
Alkaline Phosphatase: 77 U/L (ref 38–126)
Anion gap: 7 (ref 5–15)
BUN: 9 mg/dL (ref 8–23)
CO2: 26 mmol/L (ref 22–32)
Calcium: 9.4 mg/dL (ref 8.9–10.3)
Chloride: 104 mmol/L (ref 98–111)
Creatinine, Ser: 0.54 mg/dL (ref 0.44–1.00)
GFR, Estimated: >60 mL/min (ref >60)
Glucose, Bld: 164 mg/dL — ABNORMAL HIGH (ref 70–99)
Potassium: 4 mmol/L (ref 3.5–5.1)
Sodium: 137 mmol/L (ref 135–145)
Total Bilirubin: 0.7 mg/dL (ref 0.3–1.2)
Total Protein: 7.6 g/dL (ref 6.5–8.1)

## 2021-06-11 LAB — DIFFERENTIAL
Abs Immature Granulocytes: 0.03 10*3/uL (ref 0.00–0.07)
Basophils Absolute: 0 10*3/uL (ref 0.0–0.1)
Basophils Relative: 0 %
Eosinophils Absolute: 0.2 10*3/uL (ref 0.0–0.5)
Eosinophils Relative: 2 %
Immature Granulocytes: 0 %
Lymphocytes Relative: 30 %
Lymphs Abs: 3.3 10*3/uL (ref 0.7–4.0)
Monocytes Absolute: 0.7 10*3/uL (ref 0.1–1.0)
Monocytes Relative: 7 %
Neutro Abs: 6.7 10*3/uL (ref 1.7–7.7)
Neutrophils Relative %: 61 %

## 2021-06-11 LAB — PROTIME-INR
INR: 1 (ref 0.8–1.2)
Prothrombin Time: 13.2 seconds (ref 11.4–15.2)

## 2021-06-11 LAB — CBC
HCT: 40.9 % (ref 36.0–46.0)
Hemoglobin: 14 g/dL (ref 12.0–15.0)
MCH: 30 pg (ref 26.0–34.0)
MCHC: 34.2 g/dL (ref 30.0–36.0)
MCV: 87.8 fL (ref 80.0–100.0)
Platelets: 284 10*3/uL (ref 150–400)
RBC: 4.66 MIL/uL (ref 3.87–5.11)
RDW: 13.1 % (ref 11.5–15.5)
WBC: 11 10*3/uL — ABNORMAL HIGH (ref 4.0–10.5)
nRBC: 0 % (ref 0.0–0.2)

## 2021-06-11 LAB — APTT: aPTT: 28 seconds (ref 24–36)

## 2021-06-11 NOTE — ED Notes (Signed)
No answer when called several times from lobby 

## 2021-06-11 NOTE — ED Triage Notes (Signed)
See first nurse note. Pt reports for the past few days has had generalized cramping in BLE, states does not normally having feeling in feet d/t neuropathy, hx restless leg syndrome. Reports more weak on left side for several days. Alert and oriented, clear speech.    Assisted to restroom, attempted to collect urine sample, unsuccessful d/t missing cup

## 2021-06-11 NOTE — ED Notes (Signed)
Pt helped into more comfortable chair. Given ice chips and a blanket.

## 2021-06-11 NOTE — ED Notes (Signed)
No answer when called several times from lobby & outside; no answer when phone # listed in chart called 

## 2021-06-11 NOTE — ED Triage Notes (Signed)
Pt comes ems from home with foot pain. Pt has neuropathy. Has had more trouble walking lately. Left sided weakness for about 2 weeks. VSS. Normally gets around with a cane.

## 2021-06-13 ENCOUNTER — Ambulatory Visit: Payer: Self-pay | Admitting: *Deleted

## 2021-06-13 ENCOUNTER — Telehealth: Payer: Self-pay | Admitting: Internal Medicine

## 2021-06-13 NOTE — Telephone Encounter (Signed)
Move her appt up?

## 2021-06-13 NOTE — Telephone Encounter (Signed)
T, These are not symptoms of neuropathy.  Please have her see her PCP for these.

## 2021-06-13 NOTE — Telephone Encounter (Signed)
Pt calling in voiced that she  is having spasms in arms and legs on left side. She thinks it might be neuropathy. Pt would like the nurse to call pt would like to be seen quicker than her upcoming app on 07/22/2021 if possible.

## 2021-06-13 NOTE — Telephone Encounter (Signed)
Shoulder/neck spasms started Tuesday.Left arm and leg weakness for several weeks. Seen in the ED on 06/11/21. Left after 6 hours not seen by physician. CT, labs and EKG were reported to the patient as normal. She does not have a pcp at this time. Denies CP/SOB. Speech normal no vision changes. Patient lives alone uses walker to get around and has no one to assist her with ADL.  Son in from [place] will attempt to reach agent with Medicare and BCBS which she has retirement Training and development officer and start asking questions to get an evaluation/assistance for Ms. Alaylah including a list of PCP's in her area that are accepting new patients. If needed he will take patient to [facility] for evaluation of healthcare needs.  Reason for Disposition . Nursing judgment  Protocols used: No Guideline or Reference Available-A-AH

## 2021-06-19 NOTE — Telephone Encounter (Signed)
Called and was unable to relay message. Will attempt to call again.

## 2021-06-21 ENCOUNTER — Other Ambulatory Visit: Payer: Self-pay | Admitting: Internal Medicine

## 2021-06-25 NOTE — Telephone Encounter (Signed)
Called and attempted to follow up with pt. Mailbox not set up. Unable to leave a message.

## 2021-07-09 ENCOUNTER — Ambulatory Visit: Payer: Federal, State, Local not specified - PPO | Admitting: Podiatry

## 2021-07-12 ENCOUNTER — Other Ambulatory Visit: Payer: Self-pay | Admitting: Endocrinology

## 2021-07-15 ENCOUNTER — Other Ambulatory Visit: Payer: Self-pay | Admitting: Internal Medicine

## 2021-07-17 ENCOUNTER — Telehealth: Payer: Self-pay | Admitting: Internal Medicine

## 2021-07-17 NOTE — Telephone Encounter (Signed)
MEDICATION: with refills NOVOLIN N RELION 100 UNIT/ML injection  PHARMACY:   [facility] [place], [place] - [address] Phone:  [phone]  Fax:  [phone]      HAS THE PATIENT CONTACTED THEIR PHARMACY?  No  IS THIS A 90 DAY SUPPLY : Yes  IS PATIENT OUT OF MEDICATION: No  IF NOT; HOW MUCH IS LEFT: Almost 1 bottle  LAST APPOINTMENT DATE: @3 /31/2022  NEXT APPOINTMENT DATE:@10 /08/2021  DO WE HAVE YOUR PERMISSION TO LEAVE A DETAILED MESSAGE?:No VM-text okay  OTHER COMMENTS:    **Let patient know to contact pharmacy at the end of the day to make sure medication is ready. **  ** Please notify patient to allow 48-72 hours to process**  **Encourage patient to contact the pharmacy for refills or they can request refills through [facility]**

## 2021-07-18 MED ORDER — INSULIN NPH (HUMAN) (ISOPHANE) 100 UNIT/ML ~~LOC~~ SUSP: SUBCUTANEOUS | 2 refills | Status: DC

## 2021-07-18 NOTE — Addendum Note (Signed)
Addended by: Eden Lathe on: 07/18/2021 08:15 AM   Modules accepted: Orders

## 2021-07-18 NOTE — Telephone Encounter (Signed)
Patient's Novolin has now been sent to her pharmacy, attempted to inform the patient received no answer. Mailbox is full

## 2021-07-22 ENCOUNTER — Telehealth: Payer: Self-pay | Admitting: Internal Medicine

## 2021-07-22 ENCOUNTER — Other Ambulatory Visit: Payer: Self-pay

## 2021-07-22 ENCOUNTER — Encounter: Payer: Self-pay | Admitting: Internal Medicine

## 2021-07-22 ENCOUNTER — Ambulatory Visit (INDEPENDENT_AMBULATORY_CARE_PROVIDER_SITE_OTHER): Payer: Medicare Other | Admitting: Internal Medicine

## 2021-07-22 VITALS — BP 128/82 | HR 83 | Ht 67.0 in | Wt 269.0 lb

## 2021-07-22 DIAGNOSIS — E1159 Type 2 diabetes mellitus with other circulatory complications: Secondary | ICD-10-CM

## 2021-07-22 DIAGNOSIS — E785 Hyperlipidemia, unspecified: Secondary | ICD-10-CM | POA: Diagnosis not present

## 2021-07-22 DIAGNOSIS — E1165 Type 2 diabetes mellitus with hyperglycemia: Secondary | ICD-10-CM

## 2021-07-22 DIAGNOSIS — E1142 Type 2 diabetes mellitus with diabetic polyneuropathy: Secondary | ICD-10-CM

## 2021-07-22 LAB — POCT GLYCOSYLATED HEMOGLOBIN (HGB A1C): Hemoglobin A1C: 7.9 % — AB (ref 4.0–5.6)

## 2021-07-22 MED ORDER — NOVOLIN N FLEXPEN 100 UNIT/ML ~~LOC~~ SUPN: 36.0000 [IU] | PEN_INJECTOR | Freq: Two times a day (BID) | SUBCUTANEOUS | 3 refills | Status: DC

## 2021-07-22 MED ORDER — INSULIN REGULAR HUMAN 100 UNIT/ML IJ SOLN: INTRAMUSCULAR | 3 refills | Status: DC

## 2021-07-22 MED ORDER — METFORMIN HCL ER 500 MG PO TB24: ORAL_TABLET | ORAL | 3 refills | Status: DC

## 2021-07-22 MED ORDER — GABAPENTIN 100 MG PO CAPS: ORAL_CAPSULE | ORAL | 1 refills | Status: DC

## 2021-07-22 MED ORDER — BD PEN NEEDLE NANO U/F 32G X 4 MM MISC: 3 refills | Status: DC

## 2021-07-22 MED ORDER — NOVOLIN N FLEXPEN 100 UNIT/ML ~~LOC~~ SUPN
36.0000 [IU] | PEN_INJECTOR | Freq: Two times a day (BID) | SUBCUTANEOUS | 3 refills | Status: DC
Start: 2021-07-22 — End: 2021-07-22

## 2021-07-22 NOTE — Telephone Encounter (Signed)
Medications have been sent to preferred pharmacy. Along with the other 3 that were sent to other pharmacy.

## 2021-07-22 NOTE — Patient Instructions (Signed)
Please continue: - Metformin XR 500 mg 2x a day - Ozempic 1 mg weekly  Breakfast  Lunch  Dinner bedtime  Insulin N: 36 units -  - 36 units  Insulin R: 22-24 units  22-24 units  22-24 units   -  For sugars: 71-90: take 16 units of R insulin 50-70: do not take R insulin  Please return in 4 months with your sugar log or meter.

## 2021-07-22 NOTE — Progress Notes (Signed)
Patient ID: Betty [name], female   DOB: 05-May-1956, 65 y.o.   MRN: [id number]  This visit occurred during the SARS-CoV-2 public health emergency.  Safety protocols were in place, including screening questions prior to the visit, additional usage of staff PPE, and extensive cleaning of exam room while observing appropriate contact time as indicated for disinfecting solutions.   HPI: Betty [name] is a 65 y.o. female, returning for f/u for DM2, dx >10 years ago, insulin-dependent, uncontrolled, with complications (CAD - s/p stent, peripheral neuropathy, DR). Last visit 6.5 months ago.  She is here accompanied by her son who is visiting from out of town as she cannot drive by herself. She is contemplating moving to [place] to be closer to family.  Interim history: She continues to not sleep well and have significant fatigue.  She felt better after she retired in 06/2020. She was in the ED with weakness on the left side for 2 weeks on 06/11/2021.  A new stroke was ruled out. She had spasms of the arms and legs.  She also mentions pain in her neck.  She had problems refilling the insulin and also had problems giving herself injections due to weakness in her left hand. Her previous PCP left practice and she only has an appointment with a new PCP at the beginning of next month.  Reviewed HbA1c levels: Lab Results  Component Value Date   HGBA1C 7.5 (A) 01/09/2021   HGBA1C 7.7 (A) 04/19/2020   HGBA1C 8.0 (A) 09/21/2019   Pt is on a regimen of: - Metformin XR 500 mg 3 >> 2x a day - Victoza 1.2 mg daily >> Ozempic 0.5 >> 1 mg weekly  Breakfast  Lunch  Dinner bedtime  Insulin N: 36 units -  - 36 units  Insulin R: 22 units with a  smaller meal                 24 units wiith a  regular meal 22 units with a smaller meal 24 units with a larger meal 22 units with a smaller meal 24 units with a larger meal  -  For sugars: 71-90: take 16 units of R insulin 50-70: do not take R  insulin  We tried Bydureon 2 mg weekly (added 08/2015) >> not covered Was previously on 70/30 Insulin: 70-20-30 (split in 3 b/c she was dropping her sugars overnight). On these, she was still having lows in am (40s). She was on Novolog in the past but could not afford it.  She was also on Amaryl and Avandia. She has bladder dysfxn - cannot try SGLT2 inh.  She had diarrhea with regular metformin.  Meter: [place]  Pt checks her sugars 2x a day: - am: 94, 106-169, 192, 201 and 238 (missed meds) >> 94-144, 171, 194 (forgot meds) - 2h after b'fast:  >> 145, 215 >> n/c >> 139-182, 219 >> 190 - before lunch:  110-140 >> 112-159, 202 >> 120-182, 213 - 2h after lunch: 151, 199 >> n/c >> 109, 184 >> n/c >> 156 >> 140-189 - before dinner: 131, 178 >> 90-120s, 200 >> 168 >> n/c - bedtime: n/c >> 114, 160 >> 84, 122 >> n/c >> 140 Lowest CBG: 60 >> 70 >> 70-but not frequently >> 90s >> 94; she has hypoglycemia awareness in the 80s  No CKD, last BUN/creatinine was:  Lab Results  Component Value Date   BUN 9 06/11/2021   Lab Results  Component Value Date   CREATININE  0.54 06/11/2021  On ramipril.  + HL; Last set of lipids: Lab Results  Component Value Date   CHOL 103 01/09/2021   HDL 41.70 01/09/2021   LDLCALC 39 01/09/2021   LDLDIRECT 48 12/11/2019   TRIG 110.0 01/09/2021   CHOLHDL 2 01/09/2021  Prev. on Pravachol and she is not sure why she is not taking this anymore.  She was on Lipitor >> had mm cramps.  On fish oil.  Now also on Crestor 10.  She is also on Vascepa 2 g twice a day.  - eye exam: 04/2020: + DR, + glaucoma, + cataracts. Dr Senaida Ores  -+ Numbness and tingling in her legs.  She was previously on Lyrica but had to stop due to somnolence.  She also had somnolence with the 300 mg Neurontin tablets >> then 100 mg daily >> then stopped -now back on it.  She sees neurology (for  vertigo). Sees Dr Yetta Barre (podiatrist).   She also has a history of HTN, OSA, obesity, OA - on  Osteobiflex, vitamin D deficiency.  She developed severe nausea and vertigo on 04/10/2018. She was on Reglan (per Dr. Russella Dar) that helped with the nausea >> now off >> she was out of work because of this for a period of time.  She contacted our office and  I advised her to stop Victoza but her vertigo did not improve so we restarted the dose up.  Of note, brain MRI was negative for a stroke. She had a stress test with Dr. Eden Emms as she had CP with exertion >> positive >> had a heart cath >> had a stent placed.  She was restarted on a statin afterwards.  ROS: + See HPI Neurological: no tremors/+ numbness/+ tingling/no dizziness  I reviewed pt's medications, allergies, PMH, social hx, family hx, and changes were documented in the history of present illness. Otherwise, unchanged from my initial visit note.   Past Medical History:  Diagnosis Date   Arthritis    CAD, NATIVE VESSEL    a. S/p LAD stent in 2006 b. S/p DES to Lcx 08/2019 and patent LAD stent   DM (diabetes mellitus) (HCC)    Fibromyalgia    HYPERLIPIDEMIA-MIXED    HYPERTENSION, BENIGN    Neuropathy    Obesity    Osteoarthritis    Sleep apnea    Patient reported on 04/12/13   Vertigo    Past Surgical History:  Procedure Laterality Date   BREATH TEK H PYLORI N/A 03/08/2013   Procedure: BREATH TEK H PYLORI;  Surgeon: Atilano Ina, MD;  Location: [facility];  Service: General;  Laterality: N/A;   CESAREAN SECTION     CHOLECYSTECTOMY  1996   CORONARY ANGIOPLASTY WITH STENT PLACEMENT  11/2004   DES - LAD   CORONARY STENT INTERVENTION N/A 09/06/2019   Procedure: CORONARY STENT INTERVENTION;  Surgeon: Tonny Bollman, MD;  Location: [facility] CV LAB;  Service: Cardiovascular;  Laterality: N/A;   CORONARY STENT PLACEMENT  11/2004   RIGHT/LEFT HEART CATH AND CORONARY ANGIOGRAPHY N/A 09/06/2019   Procedure: RIGHT/LEFT HEART CATH AND CORONARY ANGIOGRAPHY;  Surgeon: Tonny Bollman, MD;  Location: [facility] INVASIVE CV LAB;  Service:  Cardiovascular;  Laterality: N/A;   Social History   Socioeconomic History   Marital status: Divorced    Spouse name: Not on file   Number of children: 2   Years of education: 12   Highest education level: High school graduate  Occupational History   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   Right-handed.   Caffeine use:  She will sometimes drink up to three 20-oz bottles of soda per day.  Drinks 0.5 bottles of a 5-hour energy drink daily.   Social Determinants of Health   Financial Resource Strain: Not on file  Food Insecurity: Not on file  Transportation Needs: Not on file  Physical Activity: Not on file  Stress: Not on file  Social Connections: Not on file  Intimate Partner Violence: Not on file   Current Outpatient Medications on File Prior to Visit  Medication Sig Dispense Refill   acetaminophen (TYLENOL ARTHRITIS PAIN) 650 MG CR tablet Take 650-1,300 mg by mouth every 8 (eight) hours as needed for pain.      Aromatic Inhalants (VICKS VAPOINHALER IN) Place 1 Dose into both nostrils as needed (congestion.).     aspirin EC 81 MG tablet Take 81 mg by mouth daily.     BD PEN NEEDLE NANO U/F 32G X 4 MM MISC USE ONCE DAILY 100 each 11   BD VEO INSULIN SYRINGE U/F 31G X 15/64" 1 ML MISC USE 1 NEEDLE 4 TIMES DAILY AS ADVISED 300 each 3   beta carotene 54008 UNIT capsule Take 25,000 Units by mouth daily.       Calcium Carbonate-Vitamin D (CALCIUM-VITAMIN D) 600-200 MG-UNIT CAPS Take 1 tablet by mouth every evening.      carvedilol (COREG) 3.125 MG tablet TAKE 1 TABLET BY MOUTH TWICE DAILY WITH A MEAL 180 tablet 1   cephALEXin (KEFLEX) 500 MG capsule Take 1 capsule (500 mg total) by mouth 4 (four) times daily.  28 capsule 0   cetirizine (ZYRTEC) 10 MG tablet Take 10 mg by mouth at bedtime.      Cholecalciferol (VITAMIN D3) 250 MCG (10000 UT) capsule Take 10,000 Units by mouth daily.     clopidogrel (PLAVIX) 75 MG tablet TAKE 1 TABLET BY MOUTH DAILY 90 tablet 3   diphenhydrAMINE (BENADRYL) 25 mg capsule Take 25 mg by mouth every 6 (six) hours as needed for itching.     gabapentin (NEURONTIN) 100 MG capsule TAKE 1 CAPSULE BY MOUTH EACH NIGHT AT BEDTIME 30 capsule 0   Homeopathic Products (THERAWORX RELIEF EX) Apply 1 application topically 3 (three) times daily as needed (pain.).     insulin NPH Human (NOVOLIN N RELION) 100 UNIT/ML injection INJECT 36 UNITS SUBCUTANEOUSLY TWICE DAILY 30 mL 2   Magnesium 250 MG TABS Take 250 mg by mouth daily.      meloxicam (MOBIC) 7.5 MG tablet Take 7.5 mg by mouth daily.     Menthol, Topical Analgesic, (ZIMS MAX-FREEZE EX) Apply 1 application topically 4 (four) times daily as needed (pain).     metFORMIN (GLUCOPHAGE-XR) 500 MG 24 hr tablet TAKE 1 TABLET BY MOUTH 3 TIMES DAILY WITH MEALS 270 tablet 0   Misc Natural Products (OSTEO BI-FLEX JOINT SHIELD PO) Take 1 tablet by mouth daily.       mupirocin ointment (BACTROBAN) 2 % Apply 1 application topically 2 (two) times daily. 30 g 2   nitroGLYCERIN (NITROSTAT) 0.4 MG SL tablet Place 1 tablet (0.4 mg total) under the tongue every 5 (five)  minutes as needed for chest pain. (Patient not taking: Reported on 04/19/2020) 25 tablet 3   NOVOLIN R RELION 100 UNIT/ML injection INJECT UP TO 100 UNITS SUBCUTANEOUSLY DAILY 30 mL 0   ondansetron (ZOFRAN) 4 MG tablet Take 4 mg by mouth every 8 (eight) hours as needed for nausea or vomiting.      OZEMPIC, 1 MG/DOSE, 4 MG/3ML SOPN INJECT 1MG  SUBCUTANEOUSLY  ONCE A WEEK 9 mL 3   Pumpkin Seed-Soy Germ (AZO BLADDER CONTROL/GO-LESS PO) Take 1 each by mouth 2 (two) times daily.      ramipril (ALTACE) 10 MG capsule TAKE 1 CAPSULE BY MOUTH DAILY 90 capsule 1   rosuvastatin (CRESTOR) 10 MG tablet  TAKE 1 TABLET DAILY 90 tablet 3   ULTRATRAK PRO TEST test strip TEST BLOOD SUGAR 5 TIMES DAILY AS INSTRUCTED 450 each 1   VASCEPA 1 g capsule TAKE 2 CAPSULES BY MOUTH TWICE DAILY 120 capsule 11   No current facility-administered medications on file prior to visit.   Allergies  Allergen Reactions   Lyrica [Pregabalin] Other (See Comments)    sleepy   Family History  Problem Relation Age of Onset   Heart disease Mother    Cancer Mother        ovarian   Arthritis Mother    Diabetes Mother    Hypertension Mother    Hypertension Father    Stroke Father    Diabetes Sister    Cancer Other    Coronary artery disease Other    Diabetes Other     PE: BP 128/82 (BP Location: Right Arm, Patient Position: Sitting, Cuff Size: Normal)   Pulse 83   Ht 5\' 7"  (1.702 m)   Wt 269 lb (122 kg)   SpO2 97%   BMI 42.13 kg/m  Body mass index is 42.13 kg/m. Wt Readings from Last 3 Encounters:  07/22/21 269 lb (122 kg)  06/11/21 290 lb (131.5 kg)  01/09/21 276 lb 12.8 oz (125.6 kg)   Constitutional: overweight, in NAD Eyes: PERRLA, EOMI, no exophthalmos ENT: moist mucous membranes, no thyromegaly, no cervical lymphadenopathy Cardiovascular: RRR, No MRG Respiratory: CTA B Gastrointestinal: abdomen soft, NT, ND, BS+ Musculoskeletal: no deformities, strength intact in all 4 Skin: moist, warm, no rashes Neurological: no tremor with outstretched hands, DTR normal in all 4  ASSESSMENT: 1. DM2, insulin-dependent, uncontrolled, with complications - CAD - s/p stent 11/2004 - Dr 01/11/21 - peripheral neuropathy - DR She told me that her previous endocrinologist filled out FMLA forms 2/2 uncontrolled DM2, but I could not fill this since no major highs or lows or other issues.   2. PN  3. HL  4.  Obesity class III  PLAN:  1. DM2 Patient with longstanding, insulin-dependent type 2 diabetes, on basal-bolus insulin regimen and also metformin, and weekly GLP-1 receptor agonist, with improved  control after switching from Victoza to Ozempic.  At last visit, sugars were mostly in target range with few hyperglycemic exceptions.  She had several readings in the 200s when she missed her medicines but the rest of the blood sugars were fairly controlled and she did not have hypoglycemia.  We discussed about the importance of not forgetting insulin doses but otherwise I did not suggest any change in regimen.  She was telling me that she could not tolerate metformin 3 times a day due to diarrhea and so we continued it twice a day, which she was tolerating well.  HbA1c at last visit was lower, at 7.5%. -  At today's visit, sugars appear to be slightly higher than before, but she attributes these to not obtaining the insulin from the pharmacy consistently and having to go with an insulin at times and also having problems with her left hand.  I refilled her regular insulin for a year and we also discussed about switching from NPH vials to pens to be able to inject it easier.  I am not sure if these are covered for her.  If they are not, we will need to give her enough vials and supplies so she does not have to refill the prescription every month.  For now, I advised her to keep the same doses but try to work on getting the insulin in before each meal.  We will also continue metformin and Ozempic, which she is tolerating well. - I advised her to: Patient Instructions  Please continue: - Metformin XR 500 mg 2x a day - Ozempic 1 mg weekly  Breakfast  Lunch  Dinner bedtime  Insulin N: 36 units -  - 36 units  Insulin R: 22-24 units  22-24 units  22-24 units   -  For sugars: 71-90: take 16 units of R insulin 50-70: do not take R insulin  Please return in 4 months with your sugar log or meter.  - we checked her HbA1c: 7.9% (higher) - advised to check sugars at different times of the day - 4x a day, rotating check times - advised for yearly eye exams >> she is not UTD - return to clinic in 4 months  2.  PN -Stable -She previously had somnolence with a higher dose of Neurontin, 300 mg daily, and she is now taking 100 mg twice a day (increased from once a day at last visit).  She does not complain of somnolence.  At this visit we discussed with patient and her son about possible side effects from Neurontin and advised her to stay with a minimum amount possible.  3. HL -Latest lipid panel from 12/2020 was reviewed: All fractions at goal: Lab Results  Component Value Date   CHOL 103 01/09/2021   HDL 41.70 01/09/2021   LDLCALC 39 01/09/2021   LDLDIRECT 48 12/11/2019   TRIG 110.0 01/09/2021   CHOLHDL 2 01/09/2021  -She previously had muscle aches with Lipitor.  She then tried pravastatin but stopped for an unknown reason.  Currently on Crestor 10 mg daily.  She is also on Vascepa 2 g twice a day.  4.  Obesity class III -We will continue her GLP-1 receptor agonist, which should also help with weight loss -She gained 2 pounds before last visit, previously lost 11 pounds -She lost a net 7 pounds since last visit  Carlus Pavlov, MD PhD [facility]

## 2021-07-22 NOTE — Telephone Encounter (Signed)
Script sent to [facility].  

## 2021-07-22 NOTE — Telephone Encounter (Signed)
Patient called re: the following new RX's (with refills) were sent to the wrong PHARM. Patient states the following RX's (with refills) should be sent as follows:  MEDICATION: metFORMIN (GLUCOPHAGE-XR) 500 MG 24 hr tablet  AND  gabapentin (NEURONTIN) 100 MG capsule  PHARMACY:   [facility] [place]  - [address] [place] [address] Phone:  [phone]  Fax:  [phone]      HAS THE PATIENT CONTACTED THEIR PHARMACY?  Yes-RX's were sent to wrong PHARM  IS THIS A 90 DAY SUPPLY : 60 capsules Gabapentin and 180 tablets for Metformin  IS PATIENT OUT OF MEDICATION: Yes-Gabapentin  IF NOT; HOW MUCH IS LEFT:   LAST APPOINTMENT DATE: @10 /08/2021  NEXT APPOINTMENT DATE:@2 /20/2023  DO WE HAVE YOUR PERMISSION TO LEAVE A DETAILED MESSAGE?: Send MyChart message instead  OTHER COMMENTS:    **Let patient know to contact pharmacy at the end of the day to make sure medication is ready. **  ** Please notify patient to allow 48-72 hours to process**  **Encourage patient to contact the pharmacy for refills or they can request refills through [facility]**

## 2021-07-23 ENCOUNTER — Telehealth: Payer: Self-pay | Admitting: Cardiovascular Disease

## 2021-07-23 ENCOUNTER — Ambulatory Visit: Payer: Federal, State, Local not specified - PPO | Admitting: Cardiovascular Disease

## 2021-07-23 DIAGNOSIS — G459 Transient cerebral ischemic attack, unspecified: Secondary | ICD-10-CM

## 2021-07-23 NOTE — Telephone Encounter (Signed)
PLEASE DISREGARD THE PREVIOUS PHONE NOTE IN REGARDS TO A VIRTUAL VISIT, SHE WOULD PREFER TO COME INTO THE OFFICE SO SHE R/S FOR 12/11/20 AT 4PM

## 2021-07-23 NOTE — Telephone Encounter (Signed)
Pt is not feeling well today, she wants to know if she can still have her appt today but she wants to do it virtual. Please advise pt further

## 2021-07-25 ENCOUNTER — Encounter (HOSPITAL_COMMUNITY): Payer: Self-pay

## 2021-07-26 ENCOUNTER — Encounter: Payer: Self-pay | Admitting: Internal Medicine

## 2021-07-28 ENCOUNTER — Encounter (HOSPITAL_COMMUNITY): Payer: Self-pay

## 2021-07-29 ENCOUNTER — Other Ambulatory Visit: Payer: Self-pay

## 2021-07-29 MED ORDER — INSULIN NPH (HUMAN) (ISOPHANE) 100 UNIT/ML ~~LOC~~ SUSP: SUBCUTANEOUS | 3 refills | Status: DC

## 2021-07-29 NOTE — Telephone Encounter (Signed)
Script sent for vials

## 2021-08-05 ENCOUNTER — Other Ambulatory Visit: Payer: Self-pay | Admitting: Internal Medicine

## 2021-08-27 ENCOUNTER — Other Ambulatory Visit: Payer: Self-pay | Admitting: Cardiovascular Disease

## 2021-08-27 ENCOUNTER — Other Ambulatory Visit: Payer: Self-pay | Admitting: Internal Medicine

## 2021-09-10 LAB — HM DIABETES EYE EXAM

## 2021-10-29 ENCOUNTER — Other Ambulatory Visit: Payer: Self-pay

## 2021-10-29 DIAGNOSIS — E1159 Type 2 diabetes mellitus with other circulatory complications: Secondary | ICD-10-CM

## 2021-10-29 DIAGNOSIS — E1165 Type 2 diabetes mellitus with hyperglycemia: Secondary | ICD-10-CM

## 2021-10-29 MED ORDER — INSULIN NPH (HUMAN) (ISOPHANE) 100 UNIT/ML ~~LOC~~ SUSP
SUBCUTANEOUS | 3 refills | Status: DC
Start: 2021-10-29 — End: 2022-01-09

## 2021-10-29 MED ORDER — INSULIN REGULAR HUMAN 100 UNIT/ML IJ SOLN: INTRAMUSCULAR | 3 refills | Status: DC

## 2021-11-19 ENCOUNTER — Other Ambulatory Visit: Payer: Self-pay

## 2021-11-19 DIAGNOSIS — E1159 Type 2 diabetes mellitus with other circulatory complications: Secondary | ICD-10-CM

## 2021-11-19 MED ORDER — INSULIN REGULAR HUMAN 100 UNIT/ML IJ SOLN: INTRAMUSCULAR | 1 refills | Status: DC

## 2021-12-01 ENCOUNTER — Ambulatory Visit: Payer: Medicare Other | Admitting: Internal Medicine

## 2021-12-04 ENCOUNTER — Ambulatory Visit: Payer: Medicare Other | Admitting: Internal Medicine

## 2021-12-04 NOTE — Progress Notes (Unsigned)
Patient ID: Betty [name], female   DOB: 1956-10-03, 66 y.o.   MRN: [id number]  This visit occurred during the SARS-CoV-2 public health emergency.  Safety protocols were in place, including screening questions prior to the visit, additional usage of staff PPE, and extensive cleaning of exam room while observing appropriate contact time as indicated for disinfecting solutions.   HPI: Betty [name] is a 66 y.o. female, returning for f/u for DM2, dx >10 years ago, insulin-dependent, uncontrolled, with complications (CAD - s/p stent, peripheral neuropathy, DR). Last visit 4 months ago.   She is contemplating moving to [place] to be closer to family.  Interim history: She continues to not sleep well and has significant fatigue.  She felt better after she retired in 06/2020. She had spasms of the arms and legs.  She also mentions pain in her neck.  Before last visit, she had problems refilling the insulin and also had problems giving herself injections due to weakness in her left hand.  Reviewed HbA1c levels: Lab Results  Component Value Date   HGBA1C 7.9 (A) 07/22/2021   HGBA1C 7.5 (A) 01/09/2021   HGBA1C 7.7 (A) 04/19/2020   Pt is on a regimen of: - Metformin XR 500 mg 3 >> 2x a day - Victoza 1.2 mg daily >> Ozempic 0.5 >> 1 mg weekly  Breakfast  Lunch  Dinner bedtime  Insulin N: 36 units -  - 36 units  Insulin R: 22 units with a  smaller meal                 24 units wiith a  regular meal 22 units with a smaller meal 24 units with a larger meal 22 units with a smaller meal 24 units with a larger meal  -  For sugars: 71-90: take 16 units of R insulin 50-70: do not take R insulin  We tried Bydureon 2 mg weekly (added 08/2015) >> not covered Was previously on 70/30 Insulin: 70-20-30 (split in 3 b/c she was dropping her sugars overnight). On these, she was still having lows in am (56s). She was on Novolog in the past but could not afford it.  She was also on Amaryl and  Avandia. She has bladder dysfxn - cannot try SGLT2 inh.  She had diarrhea with regular metformin.  Meter: [place]  Pt checks her sugars 2x a day: - am: 94, 106-169, 192, 201 and 238 (missed meds) >> 94-144, 171, 194 (forgot meds) - 2h after b'fast:  >> 145, 215 >> n/c >> 139-182, 219 >> 190 - before lunch:  110-140 >> 112-159, 202 >> 120-182, 213 - 2h after lunch: 151, 199 >> n/c >> 109, 184 >> n/c >> 156 >> 140-189 - before dinner: 131, 178 >> 90-120s, 200 >> 168 >> n/c - bedtime: n/c >> 114, 160 >> 84, 122 >> n/c >> 140 Lowest CBG: 60 >> 70 >> 70-but not frequently >> 90s >> 94; she has hypoglycemia awareness in the 80s  No CKD, last BUN/creatinine was:  Lab Results  Component Value Date   BUN 9 06/11/2021   Lab Results  Component Value Date   CREATININE 0.54 06/11/2021  On ramipril.  + HL; Last set of lipids: Lab Results  Component Value Date   CHOL 103 01/09/2021   HDL 41.70 01/09/2021   LDLCALC 39 01/09/2021   LDLDIRECT 48 12/11/2019   TRIG 110.0 01/09/2021   CHOLHDL 2 01/09/2021  Prev. on Pravachol and she is not sure why she is  not taking this anymore.  She was on Lipitor >> had mm cramps.  On fish oil.  Now also on Crestor 10.  She is also on Vascepa 2 g twice a day.  - eye exam: 09/10/2021: No DR, previously + DR + glaucoma, + cataracts. Dr Marvel Plan  -+ Numbness and tingling in her legs.  She was previously on Lyrica but had to stop due to somnolence.  She also had somnolence with the 300 mg Neurontin tablets >> then 100 mg daily >> then stopped -now back on it.  She sees neurology (for  vertigo). Sees Dr Ronnald Ramp (podiatrist).   She also has a history of HTN, OSA, obesity, OA - on Osteobiflex, vitamin D deficiency.  She developed severe nausea and vertigo on 04/10/2018. She was on Reglan (per Dr. Fuller Plan) that helped with the nausea >> now off >> she was out of work because of this for a period of time.  She contacted our office and  I advised her to stop Victoza but  her vertigo did not improve so we restarted the dose up.  Of note, brain MRI was negative for a stroke. She had a stress test with Dr. Johnsie Cancel as she had CP with exertion >> positive >> had a heart cath >> had a stent placed.  She was restarted on a statin afterwards.  ROS: + See HPI Neurological: no tremors/+ numbness/+ tingling/no dizziness  I reviewed pt's medications, allergies, PMH, social hx, family hx, and changes were documented in the history of present illness. Otherwise, unchanged from my initial visit note.   Past Medical History:  Diagnosis Date   Arthritis    CAD, NATIVE VESSEL    a. S/p LAD stent in 2006 b. S/p DES to Lcx 08/2019 and patent LAD stent   DM (diabetes mellitus) ([facility])    Fibromyalgia    HYPERLIPIDEMIA-MIXED    HYPERTENSION, BENIGN    Neuropathy    Obesity    Osteoarthritis    Sleep apnea    Patient reported on 04/12/13   Vertigo    Past Surgical History:  Procedure Laterality Date   BREATH TEK H PYLORI N/A 03/08/2013   Procedure: BREATH TEK H PYLORI;  Surgeon: Gayland Curry, MD;  Location: [facility];  Service: General;  Laterality: N/A;   [place]  11/2004   DES - LAD   CORONARY STENT INTERVENTION N/A 09/06/2019   Procedure: CORONARY STENT INTERVENTION;  Surgeon: Sherren Mocha, MD;  Location: [facility];  Service: Cardiovascular;  Laterality: N/A;   CORONARY STENT PLACEMENT  11/2004   RIGHT/LEFT HEART CATH AND CORONARY ANGIOGRAPHY N/A 09/06/2019   Procedure: RIGHT/LEFT HEART CATH AND CORONARY ANGIOGRAPHY;  Surgeon: Sherren Mocha, MD;  Location: [facility];  Service: Cardiovascular;  Laterality: N/A;   Social History   Socioeconomic History   Marital status: Divorced    Spouse name: Not on file   Number of children: 2   Years of education: 12   Highest education level: High school graduate  Occupational History   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   Right-handed.   Caffeine use:  She will sometimes drink up to three 20-oz bottles of soda per day.  Drinks 0.5 bottles of a 5-hour energy drink daily.   Social Determinants of Health   Financial Resource Strain: Not on file  Food Insecurity: Not on file  Transportation Needs: Not on file  Physical Activity: Not on file  Stress: Not on file  Social Connections: Not on file  Intimate Partner Violence: Not on file   Current Outpatient Medications on File Prior to Visit  Medication Sig Dispense Refill   acetaminophen (TYLENOL) 650 MG CR tablet Take 650-1,300 mg by mouth every 8 (eight) hours as needed for pain.      Aromatic Inhalants (VICKS VAPOINHALER IN) Place 1 Dose into both nostrils as needed (congestion.).     aspirin EC 81 MG tablet Take 81 mg by mouth daily.     BD VEO INSULIN SYRINGE U/F 31G X 15/64" 1 ML MISC USE 1 NEEDLE 4 TIMES DAILY AS ADVISED 300 each 3   beta carotene 25000 UNIT capsule Take 25,000 Units by mouth daily.       Calcium Carbonate-Vitamin D (CALCIUM-VITAMIN D) 600-200 MG-UNIT CAPS Take 1 tablet by mouth every evening.      carvedilol (COREG) 3.125 MG tablet TAKE 1 TABLET BY MOUTH TWICE DAILY WITH A MEAL 180 tablet 1   cephALEXin (KEFLEX) 500 MG capsule Take 1 capsule (500 mg total) by mouth 4 (four) times daily. 28 capsule 0   cetirizine (ZYRTEC) 10 MG tablet Take 10 mg by mouth at bedtime.      Cholecalciferol (VITAMIN D3) 250 MCG (10000 UT) capsule Take 10,000 Units by mouth daily.     clopidogrel (PLAVIX) 75 MG tablet TAKE 1 TABLET BY MOUTH DAILY 90 tablet 3   diphenhydrAMINE (BENADRYL) 25 mg capsule Take 25 mg by mouth every 6 (six) hours as needed for itching.     gabapentin  (NEURONTIN) 100 MG capsule TAKE 1 CAPSULE BY MOUTH 2x a day 60 capsule 1   Homeopathic Products (THERAWORX RELIEF EX) Apply 1 application topically 3 (three) times daily as needed (pain.).     insulin NPH Human (NOVOLIN N) 100 UNIT/ML injection Injected 32 units into skin 2 times daily 60 mL 3   Insulin NPH, Human,, Isophane, (NOVOLIN N FLEXPEN) 100 UNIT/ML Kiwkpen Inject 36 Units into the skin 2 (two) times daily. 60 mL 3   Insulin Pen Needle (BD PEN NEEDLE NANO U/F) 32G X 4 MM MISC Use 2x a day 200 each 3   insulin regular (NOVOLIN R RELION) 100 units/mL injection INJECT UP TO 100 UNITS SUBCUTANEOUSLY DAILY 90 mL 1   Magnesium 250 MG TABS Take 250 mg by mouth daily.      meloxicam (MOBIC) 7.5 MG tablet Take 7.5 mg by mouth daily.     Menthol, Topical Analgesic, (ZIMS MAX-FREEZE EX) Apply 1 application topically 4 (four) times daily as needed (pain).     metFORMIN (GLUCOPHAGE-XR) 500 MG 24 hr tablet TAKE 1 TABLET BY MOUTH 2 TIMES DAILY WITH MEALS 180 tablet 3   Misc Natural Products (OSTEO BI-FLEX JOINT SHIELD PO) Take 1 tablet by mouth daily.       mupirocin ointment (BACTROBAN) 2 % Apply 1 application topically 2 (two) times daily. 30 g 2   nitroGLYCERIN (NITROSTAT) 0.4 MG SL tablet Place 1 tablet (0.4 mg total) under the tongue every 5 (five) minutes as needed for chest pain. (Patient not taking: Reported on  04/19/2020) 25 tablet 3   ondansetron (ZOFRAN) 4 MG tablet Take 4 mg by mouth every 8 (eight) hours as needed for nausea or vomiting.      OZEMPIC, 1 MG/DOSE, 4 MG/3ML SOPN INJECT 1MG  SUBCUTANEOUSLY  ONCE A WEEK 9 mL 3   Pumpkin Seed-Soy Germ (AZO BLADDER CONTROL/GO-LESS PO) Take 1 each by mouth 2 (two) times daily.      ramipril (ALTACE) 10 MG capsule TAKE 1 CAPSULE BY MOUTH DAILY 90 capsule 1   rosuvastatin (CRESTOR) 10 MG tablet TAKE 1 TABLET DAILY 90 tablet 3   ULTRATRAK PRO TEST test strip TEST BLOOD SUGAR 5 TIMES DAILY AS INSTRUCTED 450 each 1   VASCEPA 1 g capsule TAKE 2 CAPSULES BY  MOUTH TWICE DAILY 120 capsule 11   No current facility-administered medications on file prior to visit.   Allergies  Allergen Reactions   Lyrica [Pregabalin] Other (See Comments)    sleepy   Family History  Problem Relation Age of Onset   Heart disease Mother    Cancer Mother        ovarian   Arthritis Mother    Diabetes Mother    Hypertension Mother    Hypertension Father    Stroke Father    Diabetes Sister    Cancer Other    Coronary artery disease Other    Diabetes Other     PE: There were no vitals taken for this visit. There is no height or weight on file to calculate BMI. Wt Readings from Last 3 Encounters:  07/22/21 269 lb (122 kg)  06/11/21 290 lb (131.5 kg)  01/09/21 276 lb 12.8 oz (125.6 kg)   Constitutional: overweight, in NAD Eyes: PERRLA, EOMI, no exophthalmos ENT: moist mucous membranes, no thyromegaly, no cervical lymphadenopathy Cardiovascular: RRR, No MRG Respiratory: CTA B Musculoskeletal: no deformities, strength intact in all 4 Skin: moist, warm, no rashes Neurological: no tremor with outstretched hands, DTR normal in all 4  ASSESSMENT: 1. DM2, insulin-dependent, uncontrolled, with complications - CAD - s/p stent 11/2004 - Dr Johnsie Cancel - peripheral neuropathy - DR She told me that her previous endocrinologist filled out FMLA forms 2/2 uncontrolled DM2, but I could not fill this since no major highs or lows or other issues.   2. PN  3. HL  4.  Obesity class III  PLAN:  1. DM2 -Patient with longstanding, insulin-dependent type 2 diabetes, on basal bolus insulin regimen and also metformin and weekly GLP-1 receptor agonist, with improved control after switching from Victoza to Ozempic.  At last visit, sugars were slightly higher than before but she attributed this to not obtaining insulin from the pharmacy consistently and also having problems with her left hand.  I suggested to switch from NPH vials to pens to be able to inject easier.  We did not  change the regimen at that time.  HbA1c was higher, at 7.9%.  - I advised her to: Patient Instructions  Please continue: - Metformin XR 500 mg 2x a day - Ozempic 1 mg weekly  Breakfast  Lunch  Dinner bedtime  Insulin N: 36 units -  - 36 units  Insulin R: 22-24 units  22-24 units  22-24 units   -  For sugars: 71-90: take 16 units of R insulin 50-70: do not take R insulin  Please return in 4 months with your sugar log or meter.  - we checked her HbA1c: 7%  - advised to check sugars at different times of the day -  4x a day, rotating check times - advised for yearly eye exams >> she is UTD - return to clinic in 4 months  2. PN -stable -She previously had somnolence with a higher dose of Neurontin, 300 mg daily, and she is now taking 100 mg twice a day (increased from once a day at last visit).  Does not describe any more somnolence. -we discussed at last visit with patient  about possible side effects from Neurontin and advised her to stay with the minimum amount possible  3. HL -Reviewed latest lipid panel from 12/2020: All fractions at goal Lab Results  Component Value Date   CHOL 103 01/09/2021   HDL 41.70 01/09/2021   LDLCALC 39 01/09/2021   LDLDIRECT 48 12/11/2019   TRIG 110.0 01/09/2021   CHOLHDL 2 01/09/2021  -She previously had muscle aches with Lipitor.  She then tried pravastatin but stopped for an unknown reason.   -Currently on Crestor 10 mg daily and Vascepa 2 g twice a day  4.  Obesity class III -We will continue Ozempic which should also help with weight loss -She lost 7 pounds before last visit  Philemon Kingdom, MD PhD [facility]

## 2021-12-05 ENCOUNTER — Other Ambulatory Visit: Payer: Self-pay | Admitting: Family Medicine

## 2021-12-05 ENCOUNTER — Other Ambulatory Visit: Payer: Self-pay | Admitting: Cardiovascular Disease

## 2021-12-05 DIAGNOSIS — R079 Chest pain, unspecified: Secondary | ICD-10-CM

## 2021-12-05 DIAGNOSIS — N950 Postmenopausal bleeding: Secondary | ICD-10-CM
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
## 2021-12-11 ENCOUNTER — Other Ambulatory Visit: Payer: Self-pay

## 2021-12-11 ENCOUNTER — Encounter: Payer: Self-pay | Admitting: Cardiovascular Disease

## 2021-12-11 ENCOUNTER — Ambulatory Visit (INDEPENDENT_AMBULATORY_CARE_PROVIDER_SITE_OTHER): Payer: Medicare Other | Admitting: Cardiovascular Disease

## 2021-12-11 VITALS — BP 120/68 | HR 84 | Ht 67.0 in | Wt 272.0 lb

## 2021-12-11 DIAGNOSIS — I493 Ventricular premature depolarization: Secondary | ICD-10-CM | POA: Diagnosis not present

## 2021-12-11 DIAGNOSIS — I10 Essential (primary) hypertension: Secondary | ICD-10-CM

## 2021-12-11 DIAGNOSIS — E782 Mixed hyperlipidemia: Secondary | ICD-10-CM

## 2021-12-11 DIAGNOSIS — I2510 Atherosclerotic heart disease of native coronary artery without angina pectoris: Secondary | ICD-10-CM | POA: Diagnosis not present

## 2021-12-11 NOTE — Patient Instructions (Signed)

## 2021-12-25 ENCOUNTER — Ambulatory Visit: Payer: Medicare Other | Admitting: Internal Medicine

## 2021-12-25 NOTE — Progress Notes (Deleted)
Patient ID: Betty [name], female   DOB: 18-May-1956, 66 y.o.   MRN: [id number] ? ?This visit occurred during the SARS-CoV-2 public health emergency.  Safety protocols were in place, including screening questions prior to the visit, additional usage of staff PPE, and extensive cleaning of exam room while observing appropriate contact time as indicated for disinfecting solutions.  ? ?HPI: ?Betty [name] is a 66 y.o. female, returning for f/u for DM2, dx >10 years ago, insulin-dependent, uncontrolled, with complications (CAD - s/p stent, peripheral neuropathy, DR). Last visit 4 months ago.   ?She is contemplating moving to [place] to be closer to family. ? ?Interim history: ?She continues to not sleep well and has significant fatigue.  She felt better after she retired in 06/2020. ?She had spasms of the arms and legs.  She also mentions pain in her neck.  ?Before last visit, she had problems refilling the insulin and also had problems giving herself injections due to weakness in her left hand. ? ?Reviewed HbA1c levels: ?Lab Results  ?Component Value Date  ? HGBA1C 7.9 (A) 07/22/2021  ? HGBA1C 7.5 (A) 01/09/2021  ? HGBA1C 7.7 (A) 04/19/2020  ? ?Pt is on a regimen of: ?- Metformin XR 500 mg 3 >> 2x a day ?- Victoza 1.2 mg daily >> Ozempic 0.5 >> 1 mg weekly ? ?Breakfast  Lunch ? Dinner bedtime  ?Insulin N: 36 units -  - 36 units  ?Insulin R: 22 units with a  smaller meal ?                24 units wiith a  regular meal 22 units with a smaller meal ?24 units with a larger meal 22 units with a smaller meal ?24 units with a larger meal ? -  ?For sugars: ?71-90: take 16 units of R insulin ?50-70: do not take R insulin ? ?We tried Bydureon 2 mg weekly (added 08/2015) >> not covered ?Was previously on 70/30 Insulin: 70-20-30 (split in 3 b/c she was dropping her sugars overnight). On these, she was still having lows in am (40s). ?She was on Novolog in the past but could not afford it.  ?She was also on Amaryl and  Avandia. ?She has bladder dysfxn - cannot try SGLT2 inh.  ?She had diarrhea with regular metformin. ? ?Meter: Livongo ? ?Pt checks her sugars 2x a day: ?- am: 94, 106-169, 192, 201 and 238 (missed meds) >> 94-144, 171, 194 (forgot meds) ?- 2h after b'fast:  >> 145, 215 >> n/c >> 139-182, 219 >> 190 ?- before lunch:  110-140 >> 112-159, 202 >> 120-182, 213 ?- 2h after lunch: 151, 199 >> n/c >> 109, 184 >> n/c >> 156 >> 140-189 ?- before dinner: 131, 178 >> 90-120s, 200 >> 168 >> n/c ?- bedtime: n/c >> 114, 160 >> 84, 122 >> n/c >> 140 ?Lowest CBG: 60 >> 70 >> 70-but not frequently >> 90s >> 94; she has hypoglycemia awareness in the 80s ? ?No CKD, last BUN/creatinine was:  ?Lab Results  ?Component Value Date  ? BUN 9 06/11/2021  ? ?Lab Results  ?Component Value Date  ? CREATININE 0.54 06/11/2021  ?On ramipril. ? ?+ HL; Last set of lipids: ?Lab Results  ?Component Value Date  ? CHOL 103 01/09/2021  ? HDL 41.70 01/09/2021  ? LDLCALC 39 01/09/2021  ? LDLDIRECT 48 12/11/2019  ? TRIG 110.0 01/09/2021  ? CHOLHDL 2 01/09/2021  ?Prev. on Pravachol and she is not sure why she is  not taking this anymore.  She was on Lipitor >> had mm cramps.  On fish oil.  Now also on Crestor 10.  She is also on Vascepa 2 g twice a day. ? ?- eye exam: 09/10/2021: No DR, previously + DR + glaucoma, + cataracts. Dr Senaida Ores ? ?-+ Numbness and tingling in her legs.  She was previously on Lyrica but had to stop due to somnolence.  She also had somnolence with the 300 mg Neurontin tablets >> then 100 mg daily >> then stopped -now back on it.  She sees neurology (for  vertigo). Sees Dr Yetta Barre (podiatrist).  ? ?She also has a history of HTN, OSA, obesity, OA - on Osteobiflex, vitamin D deficiency. ? ?She developed severe nausea and vertigo on 04/10/2018. She was on Reglan (per Dr. Russella Dar) that helped with the nausea >> now off >> she was out of work because of this for a period of time.  She contacted our office and  I advised her to stop Victoza but  her vertigo did not improve so we restarted the dose up.  Of note, brain MRI was negative for a stroke. ?She had a stress test with Dr. Eden Emms as she had CP with exertion >> positive >> had a heart cath >> had a stent placed.  She was restarted on a statin afterwards. ? ?ROS: + See HPI ?Neurological: no tremors/+ numbness/+ tingling/no dizziness ? ?I reviewed pt's medications, allergies, PMH, social hx, family hx, and changes were documented in the history of present illness. Otherwise, unchanged from my initial visit note. ? ? ?Past Medical History:  ?Diagnosis Date  ? Arthritis   ? CAD, NATIVE VESSEL   ? a. S/p LAD stent in 2006 b. S/p DES to Lcx 08/2019 and patent LAD stent  ? DM (diabetes mellitus) (HCC)   ? Fibromyalgia   ? HYPERLIPIDEMIA-MIXED   ? HYPERTENSION, BENIGN   ? Neuropathy   ? Obesity   ? Osteoarthritis   ? Sleep apnea   ? Patient reported on 04/12/13  ? Vertigo   ? ?Past Surgical History:  ?Procedure Laterality Date  ? BREATH TEK H PYLORI N/A 03/08/2013  ? Procedure: BREATH TEK H PYLORI;  Surgeon: Atilano Ina, MD;  Location: [facility];  Service: General;  Laterality: N/A;  ? CESAREAN SECTION    ? CHOLECYSTECTOMY  1996  ? CORONARY ANGIOPLASTY WITH STENT PLACEMENT  11/2004  ? DES - LAD  ? CORONARY STENT INTERVENTION N/A 09/06/2019  ? Procedure: CORONARY STENT INTERVENTION;  Surgeon: Tonny Bollman, MD;  [facility] INVASIVE CV LAB;  Service: Cardiovascular;  Laterality: N/A;  ? CORONARY STENT PLACEMENT  11/2004  ? RIGHT/LEFT HEART CATH AND CORONARY ANGIOGRAPHY N/A 09/06/2019  ? Procedure: RIGHT/LEFT HEART CATH AND CORONARY ANGIOGRAPHY;  Surgeon: Tonny Bollman, MD;  Location: [facility] INVASIVE CV LAB;  Service: Cardiovascular;  Laterality: N/A;  ? ?Social History  ? ?Socioeconomic History  ? Marital status: Divorced  ?  Spouse name: Not on file  ? Number of children: 2  ? Years of education: 50  ? Highest education level: High school graduate  ?Occupational History  ? 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 Smoking status: Never  ? Smokeless tobacco: Never  ?Vaping Use  ? Vaping Use: Never used  ?Substance and Sexual Activity  ? Alcohol use: No  ? Drug use: No  ? Sexual activity: Not on file  ?Other Topics Concern  ? Not on file  ?Social History Narrative  ?  Lives alone.  ? Right-handed.  ? Caffeine use:  She will sometimes drink up to three 20-oz bottles of soda per day.  Drinks 0.5 bottles of a 5-hour energy drink daily.  ? ?Social Determinants of Health  ? ?Financial Resource Strain: Not on file  ?Food Insecurity: Not on file  ?Transportation Needs: Not on file  ?Physical Activity: Not on file  ?Stress: Not on file  ?Social Connections: Not on file  ?Intimate Partner Violence: Not on file  ? ?Current Outpatient Medications on File Prior to Visit  ?Medication Sig Dispense Refill  ? acetaminophen (TYLENOL) 650 MG CR tablet Take 650-1,300 mg by mouth every 8 (eight) hours as needed for pain.     ? Aromatic Inhalants (VICKS VAPOINHALER IN) Place 1 Dose into both nostrils as needed (congestion.).    ? aspirin EC 81 MG tablet Take 81 mg by mouth daily.    ? BD VEO INSULIN SYRINGE U/F 31G X 15/64" 1 ML MISC USE 1 NEEDLE 4 TIMES DAILY AS ADVISED 300 each 3  ? beta carotene [id number] UNIT capsule Take 25,000 Units by mouth daily.      ? Calcium Carbonate-Vitamin D (CALCIUM-VITAMIN D) 600-200 MG-UNIT CAPS Take 1 tablet by mouth every evening.     ? carvedilol (COREG) 3.125 MG tablet TAKE 1 TABLET BY MOUTH TWICE DAILY WITH A MEAL 180 tablet 1  ? cephALEXin (KEFLEX) 500 MG capsule Take 1 capsule (500 mg total) by mouth 4 (four) times daily. 28 capsule 0  ? cetirizine (ZYRTEC) 10 MG tablet Take 10 mg by mouth at bedtime.     ? Cholecalciferol (VITAMIN D3) 250 MCG (10000 UT) capsule Take 10,000 Units by mouth daily.    ? clopidogrel (PLAVIX) 75 MG tablet TAKE 1 TABLET BY MOUTH DAILY 90 tablet 3  ? diphenhydrAMINE (BENADRYL) 25 mg capsule Take 25 mg by mouth every 6 (six) hours as needed for itching.    ? gabapentin  (NEURONTIN) 100 MG capsule TAKE 1 CAPSULE BY MOUTH 2x a day 60 capsule 1  ? Homeopathic Products (THERAWORX RELIEF EX) Apply 1 application topically 3 (three) times daily as needed (pain.).    ? insulin NPH

## 2021-12-27 ENCOUNTER — Other Ambulatory Visit: Payer: Self-pay | Admitting: Internal Medicine

## 2021-12-30 ENCOUNTER — Ambulatory Visit: Payer: Federal, State, Local not specified - PPO

## 2021-12-31 LAB — HM DIABETES EYE EXAM

## 2022-01-06 ENCOUNTER — Ambulatory Visit: Payer: Medicare Other

## 2022-01-07 ENCOUNTER — Ambulatory Visit
Admission: RE | Admit: 2022-01-07 | Discharge: 2022-01-07 | Disposition: A | Discharge status: Home / Self Care | Payer: Medicare Other | Source: Ambulatory Visit | Attending: Family Medicine | Admitting: Family Medicine

## 2022-01-07 DIAGNOSIS — N950 Postmenopausal bleeding: Secondary | ICD-10-CM | POA: Diagnosis present

## 2022-01-09 ENCOUNTER — Encounter: Payer: Self-pay | Admitting: Internal Medicine

## 2022-01-09 ENCOUNTER — Ambulatory Visit (INDEPENDENT_AMBULATORY_CARE_PROVIDER_SITE_OTHER): Payer: Medicare Other | Admitting: Internal Medicine

## 2022-01-09 VITALS — BP 130/90 | HR 85 | Ht 67.0 in | Wt 270.2 lb

## 2022-01-09 DIAGNOSIS — E785 Hyperlipidemia, unspecified: Secondary | ICD-10-CM | POA: Diagnosis not present

## 2022-01-09 DIAGNOSIS — E1165 Type 2 diabetes mellitus with hyperglycemia: Secondary | ICD-10-CM

## 2022-01-09 DIAGNOSIS — E1159 Type 2 diabetes mellitus with other circulatory complications: Secondary | ICD-10-CM

## 2022-01-09 DIAGNOSIS — I2510 Atherosclerotic heart disease of native coronary artery without angina pectoris: Secondary | ICD-10-CM | POA: Diagnosis not present

## 2022-01-09 LAB — POCT GLYCOSYLATED HEMOGLOBIN (HGB A1C): Hemoglobin A1C: 7.7 % — AB (ref 4.0–5.6)

## 2022-01-09 MED ORDER — INSULIN REGULAR HUMAN 100 UNIT/ML IJ SOLN: INTRAMUSCULAR | 3 refills | Status: DC

## 2022-01-09 MED ORDER — INSULIN NPH (HUMAN) (ISOPHANE) 100 UNIT/ML ~~LOC~~ SUSP: SUBCUTANEOUS | 3 refills | Status: DC

## 2022-01-09 MED ORDER — "BD VEO INSULIN SYRINGE U/F 31G X 15/64"" 1 ML MISC": 3 refills | Status: DC

## 2022-01-09 NOTE — Progress Notes (Signed)
Patient ID: Betty [name], female   DOB: [birth year], 66 y.o.   MRN: [id number] ? ?This visit occurred during the SARS-CoV-2 public health emergency.  Safety protocols were in place, including screening questions prior to the visit, additional usage of staff PPE, and extensive cleaning of exam room while observing appropriate contact time as indicated for disinfecting solutions.  ? ?HPI: ?Betty [name] is a 66 y.o. female, returning for f/u for DM2, dx >10 years ago, insulin-dependent, uncontrolled, with complications (CAD - s/p stent, peripheral neuropathy). Last visit 4 months ago.   ?She is contemplating moving to [place] to be closer to family. ? ?Interim history: ?She continues to not sleep well and has significant fatigue.  She felt better after she retired in 06/2020. ?She continues to have muscle aches and spasms in arms, legs, neck. ?Before last visit, she had problems refilling the insulin and also had problems giving herself injections due to weakness in her left hand. She still has problems obtaining the insulins. She was off the N insulin for 1 week. ?She had increased stress with her sister recently having had leg amputation. ? ?Reviewed HbA1c levels: ?Lab Results  ?Component Value Date  ? HGBA1C 7.9 (A) 07/22/2021  ? HGBA1C 7.5 (A) 01/09/2021  ? HGBA1C 7.7 (A) 04/19/2020  ? ?Pt is on a regimen of: ?- Metformin XR 500 mg 3 >> 2x a day ?- Victoza 1.2 mg daily >> Ozempic 0.5 >> 1 mg weekly ? ?Breakfast  Lunch ? Dinner bedtime  ?Insulin N: 36 units -  - 36 units  ?Insulin R: 22 units with a  smaller meal ?                24 units wiith a  regular meal 22 units with a smaller meal ?24 units with a larger meal 22 units with a smaller meal ?24 units with a larger meal ? -  ?For sugars: ?71-90: take 16 units of R insulin ?50-70: do not take R insulin ? ?We tried Bydureon 2 mg weekly (added 08/2015) >> not covered ?Was previously on 70/30 Insulin: 70-20-30 (split in 3 b/c she was dropping her  sugars overnight). On these, she was still having lows in am (48s). ?She was on Novolog in the past but could not afford it.  ?She was also on Amaryl and Avandia. ?She has bladder dysfxn - cannot try SGLT2 inh.  ?She had diarrhea with regular metformin. ? ?Meter: Livongo ? ?Pt checks her sugars 2x a day: ?- am: 94, 106-169, 192, 201 and 238 >> 94-144, 171, 194 >>131-270 (missed many medications) ?- 2h after b'fast:  >> 145, 215 >> n/c >> 139-182, 219 >> 190 >> n/c ?- before lunch:  110-140 >> 112-159, 202 >> 120-182, 213 >> 143-213 ?- 2h after lunch: 109, 184 >> n/c >> 156 >> 140-189 >> n/c ?- before dinner: 131, 178 >> 90-120s, 200 >> 168 >> n/c ?- bedtime: n/c >> 114, 160 >> 84, 122 >> n/c >> 140 ?Lowest CBG: 60 >> 70 >> 70-but not frequently >> 90s >> 94; she has hypoglycemia awareness in the 80s ? ?No CKD, last BUN/creatinine was:  ?Lab Results  ?Component Value Date  ? BUN 9 06/11/2021  ? ?Lab Results  ?Component Value Date  ? CREATININE 0.54 06/11/2021  ?On ramipril. ? ?+ HL; Last set of lipids: ?Lab Results  ?Component Value Date  ? CHOL 103 01/09/2021  ? HDL 41.70 01/09/2021  ? [place] 39 01/09/2021  ? LDLDIRECT 48  12/11/2019  ? TRIG 110.0 01/09/2021  ? CHOLHDL 2 01/09/2021  ?Prev. on Pravachol and she is not sure why she is not taking this anymore.  She was on Lipitor >> had mm cramps.  On fish oil.  Now also on Crestor 10.  She is also on Vascepa 2 g twice a day. ? ?- eye exam: 09/10/2021: No DR, previously + DR + glaucoma, + cataracts. Dr Marvel Plan ? ?-+ Numbness and tingling in her legs.  She was previously on Lyrica but had to stop due to somnolence.  She also had somnolence with the 300 mg Neurontin tablets >> then 100 mg daily >> then stopped -now back on it.  She sees neurology (for  vertigo). Sees Dr Ronnald Ramp (podiatrist).  ? ?She also has a history of HTN, OSA, obesity, OA - on Osteobiflex, vitamin D deficiency. ? ?She developed severe nausea and vertigo on 04/10/2018. She was on Reglan (per Dr.  Fuller Plan) that helped with the nausea >> now off >> she was out of work because of this for a period of time.  She contacted our office and  I advised her to stop Victoza but her vertigo did not improve so we restarted the dose up.  Of note, brain MRI was negative for a stroke. ?She had a stress test with Dr. Johnsie Cancel as she had CP with exertion >> positive >> had a heart cath >> had a stent placed.  She was restarted on a statin afterwards. ? ?ROS: + See HPI ?Neurological: no tremors/+ numbness/+ tingling/no dizziness ? ?I reviewed pt's medications, allergies, PMH, social hx, family hx, and changes were documented in the history of present illness. Otherwise, unchanged from my initial visit note. ? ? ?Past Medical History:  ?Diagnosis Date  ? Arthritis   ? CAD, NATIVE VESSEL   ? a. S/p LAD stent in 2006 b. S/p DES to Lcx 08/2019 and patent LAD stent  ? DM (diabetes mellitus) ([place])   ? Fibromyalgia   ? HYPERLIPIDEMIA-MIXED   ? HYPERTENSION, BENIGN   ? Neuropathy   ? Obesity   ? Osteoarthritis   ? Sleep apnea   ? Patient reported on 04/12/13  ? Vertigo   ? ?Past Surgical History:  ?Procedure Laterality Date  ? BREATH TEK H PYLORI N/A 03/08/2013  ? Procedure: BREATH TEK H PYLORI;  Surgeon: Gayland Curry, MD;  Location: [facility];  Service: General;  Laterality: N/A;  ? CESAREAN SECTION    ? CHOLECYSTECTOMY  1996  ? CORONARY ANGIOPLASTY WITH STENT PLACEMENT  11/2004  ? DES - LAD  ? CORONARY STENT INTERVENTION N/A 09/06/2019  ? Procedure: CORONARY STENT INTERVENTION;  Surgeon: Sherren Mocha, MD;  Location: [facility];  Service: Cardiovascular;  Laterality: N/A;  ? CORONARY STENT PLACEMENT  11/2004  ? RIGHT/LEFT HEART CATH AND CORONARY ANGIOGRAPHY N/A 09/06/2019  ? Procedure: RIGHT/LEFT HEART CATH AND CORONARY ANGIOGRAPHY;  Surgeon: Sherren Mocha, MD;  Location: [facility];  Service: Cardiovascular;  Laterality: N/A;  ? ?Social History  ? ?Socioeconomic History  ? Marital status: Divorced  ?  Spouse name:  Not on file  ? Number of children: 2  ? Years of education: 80  ? Highest education level: High school graduate  ?Occupational History  ? 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 Smoking status: Never  ? Smokeless tobacco: Never  ?Vaping Use  ? Vaping Use: Never used  ?Substance and Sexual Activity  ? Alcohol use: No  ?  Drug use: No  ? Sexual activity: Not on file  ?Other Topics Concern  ? Not on file  ?Social History Narrative  ? Lives alone.  ? Right-handed.  ? Caffeine use:  She will sometimes drink up to three 20-oz bottles of soda per day.  Drinks 0.5 bottles of a 5-hour energy drink daily.  ? ?Social Determinants of Health  ? ?Financial Resource Strain: Not on file  ?Food Insecurity: Not on file  ?Transportation Needs: Not on file  ?Physical Activity: Not on file  ?Stress: Not on file  ?Social Connections: Not on file  ?Intimate Partner Violence: Not on file  ? ?Current Outpatient Medications on File Prior to Visit  ?Medication Sig Dispense Refill  ? acetaminophen (TYLENOL) 650 MG CR tablet Take 650-1,300 mg by mouth every 8 (eight) hours as needed for pain.     ? Aromatic Inhalants (VICKS VAPOINHALER IN) Place 1 Dose into both nostrils as needed (congestion.).    ? aspirin EC 81 MG tablet Take 81 mg by mouth daily.    ? BD VEO INSULIN SYRINGE U/F 31G X 15/64" 1 ML MISC USE 1 NEEDLE 4 TIMES DAILY AS ADVISED 100 each 0  ? beta carotene 25000 UNIT capsule Take 25,000 Units by mouth daily.      ? Calcium Carbonate-Vitamin D (CALCIUM-VITAMIN D) 600-200 MG-UNIT CAPS Take 1 tablet by mouth every evening.     ? carvedilol (COREG) 3.125 MG tablet TAKE 1 TABLET BY MOUTH TWICE DAILY WITH A MEAL 180 tablet 1  ? cephALEXin (KEFLEX) 500 MG capsule Take 1 capsule (500 mg total) by mouth 4 (four) times daily. 28 capsule 0  ? cetirizine (ZYRTEC) 10 MG tablet Take 10 mg by mouth at bedtime.     ? Cholecalciferol (VITAMIN D3) 250 MCG (10000 UT) capsule Take 10,000 Units by mouth daily.    ? clopidogrel (PLAVIX) 75 MG  tablet TAKE 1 TABLET BY MOUTH DAILY 90 tablet 3  ? diphenhydrAMINE (BENADRYL) 25 mg capsule Take 25 mg by mouth every 6 (six) hours as needed for itching.    ? gabapentin (NEURONTIN) 100 MG capsule TAKE 1 CAPSULE

## 2022-01-09 NOTE — Patient Instructions (Signed)
Please continue: ?- Metformin XR 500 mg 2x a day ?- Ozempic 1 mg weekly ? ?Breakfast  Lunch ? Dinner bedtime  ?Insulin N: 36 units -  - 36 units  ?Insulin R: 22-24 units  22-24 units  22-24 units  ? -  ?For sugars: ?71-90: take 16 units of R insulin ?50-70: do not take R insulin ? ?Please stop at the lab. ? ?Please return in 4 months with your sugar log or meter. ?

## 2022-01-10 LAB — LIPID PANEL
Chol/HDL Ratio: 2.2 ratio (ref 0.0–4.4)
Cholesterol, Total: 102 mg/dL (ref 100–199)
HDL: 47 mg/dL (ref >39)
LDL Chol Calc (NIH): 32 mg/dL (ref 0–99)
Triglycerides: 134 mg/dL (ref 0–149)
VLDL Cholesterol Cal: 23 mg/dL (ref 5–40)

## 2022-01-23 ENCOUNTER — Telehealth: Payer: Self-pay | Admitting: Obstetrics and Gynecology

## 2022-01-23 NOTE — Telephone Encounter (Signed)
Received referral from [facility] practice for post menopausal bleeding.  ?Left message for patient to call back. Please schedule with Dr Jolayne Panther at the next available. ?

## 2022-01-26 ENCOUNTER — Telehealth: Payer: Self-pay

## 2022-01-26 NOTE — Telephone Encounter (Signed)
Patient is scheduled for 03/04/22 with PC

## 2022-01-26 NOTE — Telephone Encounter (Signed)
[facility] referring for Post menopausal bleeding.  Paper records in Media .01/23/22 Tonya N left voicemail for patient to call back to be scheduled.

## 2022-02-03 ENCOUNTER — Other Ambulatory Visit: Payer: Self-pay

## 2022-02-03 DIAGNOSIS — E1165 Type 2 diabetes mellitus with hyperglycemia: Secondary | ICD-10-CM

## 2022-02-03 MED ORDER — INSULIN NPH (HUMAN) (ISOPHANE) 100 UNIT/ML ~~LOC~~ SUSP: SUBCUTANEOUS | 3 refills | Status: DC

## 2022-02-04 ENCOUNTER — Other Ambulatory Visit: Payer: Self-pay | Admitting: Internal Medicine

## 2022-02-04 DIAGNOSIS — E1159 Type 2 diabetes mellitus with other circulatory complications: Secondary | ICD-10-CM

## 2022-02-04 DIAGNOSIS — E1165 Type 2 diabetes mellitus with hyperglycemia: Secondary | ICD-10-CM

## 2022-02-14 ENCOUNTER — Other Ambulatory Visit: Payer: Self-pay | Admitting: Internal Medicine

## 2022-02-14 DIAGNOSIS — E1159 Type 2 diabetes mellitus with other circulatory complications: Secondary | ICD-10-CM

## 2022-02-14 DIAGNOSIS — E1142 Type 2 diabetes mellitus with diabetic polyneuropathy: Secondary | ICD-10-CM

## 2022-03-04 ENCOUNTER — Encounter: Payer: Federal, State, Local not specified - PPO | Admitting: Obstetrics and Gynecology

## 2022-03-11 ENCOUNTER — Encounter: Payer: Federal, State, Local not specified - PPO | Admitting: Obstetrics

## 2022-03-17 ENCOUNTER — Other Ambulatory Visit: Payer: Self-pay

## 2022-03-17 MED ORDER — CLOPIDOGREL BISULFATE 75 MG PO TABS: 75.0000 mg | ORAL_TABLET | Freq: Every day | ORAL | 3 refills | Status: DC

## 2022-03-19 ENCOUNTER — Encounter: Payer: Federal, State, Local not specified - PPO | Admitting: Obstetrics

## 2022-03-24 ENCOUNTER — Encounter: Payer: Federal, State, Local not specified - PPO | Admitting: Family Medicine

## 2022-04-03 ENCOUNTER — Encounter: Payer: Federal, State, Local not specified - PPO | Admitting: Obstetrics

## 2022-04-09 ENCOUNTER — Other Ambulatory Visit: Payer: Self-pay | Admitting: Cardiovascular Disease

## 2022-04-13 ENCOUNTER — Other Ambulatory Visit: Payer: Self-pay

## 2022-04-13 ENCOUNTER — Telehealth: Payer: Self-pay

## 2022-04-13 MED ORDER — ICOSAPENT ETHYL 1 G PO CAPS: 2.0000 g | ORAL_CAPSULE | Freq: Two times a day (BID) | ORAL | 3 refills | Status: DC

## 2022-04-13 NOTE — Telephone Encounter (Signed)
Received fax from [facility] stating pt needs P/A for Vascepa. I called pt's Insurance per instructions on CoverMyMeds [phone] and they stated the generic was covered.  I called the pharmacy and they stated that it was sent in as DAW.  I called the pt and had her look on her bottle to see which one she had been taking and it was the generic. It is not documented in her chart that she needs to take the name brand.  Per the pharmacy they ask me to send a new Rx so they could delete out the one that said DAW.  New Rx was sent to the pharmacy.

## 2022-04-17 ENCOUNTER — Other Ambulatory Visit: Payer: Self-pay | Admitting: Internal Medicine

## 2022-04-21 ENCOUNTER — Encounter: Payer: Federal, State, Local not specified - PPO | Admitting: Family Medicine

## 2022-04-28 ENCOUNTER — Encounter: Payer: Federal, State, Local not specified - PPO | Admitting: Obstetrics & Gynecology

## 2022-05-12 ENCOUNTER — Ambulatory Visit: Payer: Medicare Other | Admitting: Internal Medicine

## 2022-05-12 NOTE — Progress Notes (Deleted)
Patient ID: Betty FerrierHelen E [name], female   DOB: 1955/12/08, 66 y.o.   MRN: [id number]  HPI: Betty FerrierHelen E Dubuc is a 66 y.o. female, returning for f/u for DM2, dx >10 years ago, insulin-dependent, uncontrolled, with complications (CAD - s/p stent, peripheral neuropathy). Last visit 4 months ago.   [place] closer to [place].  Interim history: She continues to not sleep well and has significant fatigue.  She felt better after she retired in 06/2020. She also has generalized aches and pains.  Reviewed HbA1c levels: Lab Results  Component Value Date   HGBA1C 7.7 (A) 01/09/2022   HGBA1C 7.9 (A) 07/22/2021   HGBA1C 7.5 (A) 01/09/2021   Pt is on a regimen of: - Metformin XR 500 mg 3 >> 2x a day - Victoza 1.2 mg daily >> Ozempic 0.5 >> 1 mg weekly  Breakfast  Lunch  Dinner bedtime  Insulin N: 36 units -  - 36 units  Insulin R: 22 units with a  smaller meal                 24 units wiith a  regular meal 22 units with a smaller meal 24 units with a larger meal 22 units with a smaller meal 24 units with a larger meal  -  For sugars: 71-90: take 16 units of R insulin 50-70: do not take R insulin  We tried Bydureon 2 mg weekly (added 08/2015) >> not covered Was previously on 70/30 Insulin: 70-20-30 (split in 3 b/c she was dropping her sugars overnight). On these, she was still having lows in am (40s). She was on Novolog in the past but could not afford it.  She was also on Amaryl and Avandia. She has bladder dysfxn - cannot try SGLT2 inh.  She had diarrhea with regular metformin.  Meter: Liv[place]  Pt checks her sugars 2x a day: - am: 94, 106-169, 192, 201 and 238 >> 94-144, 171, 194 >>131-270 - 2h after b'fast:  >> 145, 215 >> n/c >> 139-182, 219 >> 190 >> n/c - before lunch:  110-140 >> 112-159, 202 >> 120-182, 213 >> 143-213 - 2h after lunch: 109, 184 >> n/c >> 156 >> 140-189 >> n/c - before dinner: 131, 178 >> 90-120s, 200 >> 168 >> n/c -  bedtime: n/c >> 114, 160 >> 84, 122 >> n/c >> 140 Lowest CBG: 60 >> ... 90s >> 2594; she has hypoglycemia awareness in the 80s  No CKD, last BUN/creatinine was:  Lab Results  Component Value Date   BUN 9 06/11/2021   Lab Results  Component Value Date   CREATININE 0.54 06/11/2021  On ramipril.  + HL; Last set of lipids: Lab Results  Component Value Date   CHOL 102 01/09/2022   HDL 47 01/09/2022   LDLCALC 32 01/09/2022   LDLDIRECT 48 12/11/2019   TRIG 134 01/09/2022   CHOLHDL 2.2 01/09/2022  Prev. on Pravachol and she is not sure why she is not taking this anymore.  She was on Lipitor >> had mm cramps.  On fish oil.  Now also on Crestor 10.  She is also on Vascepa 2 g twice a day.  - eye exam: 09/10/2021: No DR, previously + DR + glaucoma, + cataracts. Dr Senaida Oresichardson  -+ Numbness and tingling in her legs.  She was previously on Lyrica but had to stop due to somnolence.  She also had somnolence with the 300 mg Neurontin tablets >> then 100 mg  daily >> then stopped -now back on it.  She sees neurology (for  vertigo). Sees Dr Yetta Barre (podiatrist).   She also has a history of HTN, OSA, obesity, OA - on Osteobiflex, vitamin D deficiency.  She developed severe nausea and vertigo on 04/10/2018. She was on Reglan (per Dr. Russella Dar) that helped with the nausea >> now off >> she was out of work because of this for a period of time.  She contacted our office and  I advised her to stop Victoza but her vertigo did not improve so we restarted the dose up.  Of note, brain MRI was negative for a stroke. She had a stress test with Dr. Eden Emms as she had CP with exertion >> positive >> had a heart cath >> had a stent placed.  She was restarted on a statin afterwards.  ROS: + See HPI Neurological: no tremors/+ numbness/+ tingling/no dizziness  I reviewed pt's medications, allergies, PMH, social hx, family hx, and changes were documented in the history of present illness. Otherwise, unchanged from my initial  visit note.   Past Medical History:  Diagnosis Date   Arthritis    CAD, NATIVE VESSEL    a. S/p LAD stent in 2006 b. S/p DES to Lcx 08/2019 and patent LAD stent   DM (diabetes mellitus) (HCC)    Fibromyalgia    HYPERLIPIDEMIA-MIXED    HYPERTENSION, BENIGN    Neuropathy    Obesity    Osteoarthritis    Sleep apnea    Patient reported on 04/12/13   Vertigo    Past Surgical History:  Procedure Laterality Date   BREATH TEK H PYLORI N/A 03/08/2013   Procedure: BREATH TEK H PYLORI;  Surgeon: Atilano Ina, MD;  Location: [facility];  Service: General;  Laterality: N/A;   CESAREAN SECTION     CHOLECYSTECTOMY  1996   CORONARY ANGIOPLASTY WITH STENT PLACEMENT  11/2004   DES - LAD   CORONARY STENT INTERVENTION N/A 09/06/2019   Procedure: CORONARY STENT INTERVENTION;  Surgeon: Tonny Bollman, MD;  Location: [facility] INVASIVE CV LAB;  Service: Cardiovascular;  Laterality: N/A;   CORONARY STENT PLACEMENT  11/2004   RIGHT/LEFT HEART CATH AND CORONARY ANGIOGRAPHY N/A 09/06/2019   Procedure: RIGHT/LEFT HEART CATH AND CORONARY ANGIOGRAPHY;  Surgeon: Tonny Bollman, MD;  Location: [facility] INVASIVE CV LAB;  Service: Cardiovascular;  Laterality: N/A;   Social History   Socioeconomic History   Marital status: Divorced    Spouse name: Not on file   Number of children: 2   Years of education: 12   Highest education level: High school graduate  Occupational History   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   Right-handed.   Caffeine use:  She will sometimes drink up to three 20-oz bottles of soda per day.  Drinks 0.5 bottles of a 5-hour energy drink daily.   Social Determinants of Health   Financial Resource Strain: Not on file   Food Insecurity: Not on file  Transportation Needs: Not on file  Physical Activity: Not on file  Stress: Not on file  Social Connections: Not on file  Intimate Partner Violence: Not on file  Current Outpatient Medications on File Prior to Visit  Medication Sig Dispense Refill   acetaminophen (TYLENOL) 650 MG CR tablet Take 650-1,300 mg by mouth every 8 (eight) hours as needed for pain.      Aromatic Inhalants (VICKS VAPOINHALER IN) Place 1 Dose into both nostrils as needed (congestion.).     aspirin EC 81 MG tablet Take 81 mg by mouth daily.     beta carotene 44315 UNIT capsule Take 25,000 Units by mouth daily.       Calcium Carbonate-Vitamin D (CALCIUM-VITAMIN D) 600-200 MG-UNIT CAPS Take 1 tablet by mouth every evening.      carvedilol (COREG) 3.125 MG tablet TAKE 1 TABLET BY MOUTH TWICE DAILY WITH A MEAL 180 tablet 1   cephALEXin (KEFLEX) 500 MG capsule Take 1 capsule (500 mg total) by mouth 4 (four) times daily. 28 capsule 0   cetirizine (ZYRTEC) 10 MG tablet Take 10 mg by mouth at bedtime.      Cholecalciferol (VITAMIN D3) 250 MCG (10000 UT) capsule Take 10,000 Units by mouth daily.     clopidogrel (PLAVIX) 75 MG tablet Take 1 tablet (75 mg total) by mouth daily. 90 tablet 3   diphenhydrAMINE (BENADRYL) 25 mg capsule Take 25 mg by mouth every 6 (six) hours as needed for itching.     gabapentin (NEURONTIN) 100 MG capsule TAKE 1 CAPSULE BY MOUTH TWICE DAILY 180 capsule 1   Homeopathic Products (THERAWORX RELIEF EX) Apply 1 application topically 3 (three) times daily as needed (pain.).     icosapent Ethyl (VASCEPA) 1 g capsule Take 2 capsules (2 g total) by mouth 2 (two) times daily. 360 capsule 3   insulin NPH Human (NOVOLIN N) 100 UNIT/ML injection INJECTED 36 UNITS INTO SKIN 2 TIMES DAILY 70 mL 3   insulin regular (NOVOLIN R RELION) 100 units/mL injection INJECT UP TO 100 UNITS SUBCUTANEOUSLY DAILY 90 mL 3   Insulin Syringe-Needle U-100 (BD VEO INSULIN SYRINGE U/F) 31G X 15/64" 1 ML  MISC USE 1 NEEDLE 4 TIMES DAILY AS ADVISED 400 each 3   Magnesium 250 MG TABS Take 250 mg by mouth daily.      meloxicam (MOBIC) 7.5 MG tablet Take 7.5 mg by mouth daily.     Menthol, Topical Analgesic, (ZIMS MAX-FREEZE EX) Apply 1 application topically 4 (four) times daily as needed (pain).     metFORMIN (GLUCOPHAGE-XR) 500 MG 24 hr tablet TAKE 1 TABLET BY MOUTH 3 TIMES DAILY WITH MEALS 270 tablet 1   Misc Natural Products (OSTEO BI-FLEX JOINT SHIELD PO) Take 1 tablet by mouth daily.       mupirocin ointment (BACTROBAN) 2 % Apply 1 application topically 2 (two) times daily. 30 g 2   nitroGLYCERIN (NITROSTAT) 0.4 MG SL tablet PLACE 1 TABLET UNDER THE TONGUE EVERY 5 MINUTES AS NEEDED FOR CHEST PAIN UP TO 3 DOSES, IF SYMPTOMS PERSIST CALL 911 25 tablet 3   ondansetron (ZOFRAN) 4 MG tablet Take 4 mg by mouth every 8 (eight) hours as needed for nausea or vomiting.      OZEMPIC, 1 MG/DOSE, 4 MG/3ML SOPN INJECT 1MG  SUBCUTANEOUSLY  ONCE A WEEK 9 mL 3   Pumpkin Seed-Soy Germ (AZO BLADDER CONTROL/GO-LESS PO) Take 1 each by mouth 2 (two) times daily.      ramipril (ALTACE) 10 MG capsule TAKE 1 CAPSULE BY MOUTH DAILY 90 capsule 1   rosuvastatin (CRESTOR) 10 MG tablet TAKE 1 TABLET DAILY 90 tablet 3   solifenacin (VESICARE) 5 MG tablet  Take 5 mg by mouth daily.     ULTRATRAK PRO TEST test strip TEST BLOOD SUGAR 5 TIMES DAILY AS INSTRUCTED 450 each 1   No current facility-administered medications on file prior to visit.   Allergies  Allergen Reactions   Lyrica [Pregabalin] Other (See Comments)    sleepy   Family History  Problem Relation Age of Onset   Heart disease Mother    Cancer Mother        ovarian   Arthritis Mother    Diabetes Mother    Hypertension Mother    Hypertension Father    Stroke Father    Diabetes Sister    Cancer Other    Coronary artery disease Other    Diabetes Other     PE: There were no vitals taken for this visit.  Wt Readings from Last 3 Encounters:  01/09/22 270  lb 3.2 oz (122.6 kg)  12/11/21 272 lb (123.4 kg)  07/22/21 269 lb (122 kg)   Constitutional: overweight, in NAD Eyes: no exophthalmos ENT: moist mucous membranes, no masses palpated in neck, no cervical lymphadenopathy Cardiovascular: RRR, No MRG Respiratory: CTA B Musculoskeletal: no deformities Skin: moist, warm, no rashes Neurological: no tremor with outstretched hands  ASSESSMENT: 1. DM2, insulin-dependent, uncontrolled, with complications - CAD - s/p stent 11/2004 - Dr Eden Emms - peripheral neuropathy - DR She told me that her previous endocrinologist filled out FMLA forms 2/2 uncontrolled DM2, but I could not fill this since no major highs or lows or other issues.   2. PN  3. HL  4.  Obesity class III  PLAN:  1. DM2 -Patient with longstanding, insulin-dependent, type 2 diabetes, on basal-bolus insulin regimen and also metformin and weekly GLP-1 receptor agonist, with improved control after switching from Victoza to Ozempic.  At last visit, sugars have been higher than target but she was missing many medication doses.  She had a hard time obtaining all of her insulins and she was out of the regular insulin for a period of time and then also out of NPH insulin.  I refilled all of her diabetic prescriptions to the retired pharmacist at last visit.  We did not change her regimen since this regimen was working well for her if taken consistently.  HbA1c at last visit was 7.7%, slightly better than before.  - I advised her to: Patient Instructions  Please continue: - Metformin XR 500 mg 2x a day - Ozempic 1 mg weekly  Breakfast  Lunch  Dinner bedtime  Insulin N: 36 units -  - 36 units  Insulin R: 22-24 units  22-24 units  22-24 units   -  For sugars: 71-90: take 16 units of R insulin 50-70: do not take R insulin  Please return in 4 months with your sugar log or meter.  - we checked her HbA1c: 7%  - advised to check sugars at different times of the day - 4x a day, rotating  check times - advised for yearly eye exams >> she is UTD - return to clinic in 4 months  2. PN -Stable -She previously had somnolence with a higher dose of Neurontin, 300 mg daily, currently on 100 mg twice a day -We discussed about possible side effects of Neurontin and advised her to stay with the minimum dose possible.    3. HL -Reviewed latest lipid panel from 12/2021: All fractions at goal: Lab Results  Component Value Date   CHOL 102 01/09/2022   HDL 47 01/09/2022  LDLCALC 32 01/09/2022   LDLDIRECT 48 12/11/2019   TRIG 134 01/09/2022   CHOLHDL 2.2 01/09/2022  -She previously had muscle aches with Lipitor.  She then tried pravastatin but stopped for an unknown reason.   -She continues on Crestor 10 mg daily and Vascepa 2 g twice a day without side effects  4.  Obesity class III -We will continue Ozempic which should also help with weight loss -Before last visit, she gained 1 pound, previously lost 7  Carlus Pavlov, MD PhD [facility]

## 2022-05-26 ENCOUNTER — Encounter: Payer: Federal, State, Local not specified - PPO | Admitting: Obstetrics & Gynecology

## 2022-06-01 ENCOUNTER — Other Ambulatory Visit: Payer: Self-pay

## 2022-06-01 MED ORDER — CARVEDILOL 3.125 MG PO TABS: 3.1250 mg | ORAL_TABLET | Freq: Two times a day (BID) | ORAL | 2 refills | Status: DC

## 2022-06-22 ENCOUNTER — Encounter: Payer: Federal, State, Local not specified - PPO | Admitting: Obstetrics & Gynecology

## 2022-06-23 ENCOUNTER — Other Ambulatory Visit: Payer: Self-pay | Admitting: Internal Medicine

## 2022-07-07 ENCOUNTER — Encounter: Payer: Federal, State, Local not specified - PPO | Admitting: Obstetrics & Gynecology

## 2022-07-27 ENCOUNTER — Ambulatory Visit: Payer: Medicare Other | Admitting: Internal Medicine

## 2022-08-03 ENCOUNTER — Other Ambulatory Visit: Payer: Self-pay | Admitting: Internal Medicine

## 2022-08-03 DIAGNOSIS — E1165 Type 2 diabetes mellitus with hyperglycemia: Secondary | ICD-10-CM

## 2022-08-04 ENCOUNTER — Other Ambulatory Visit: Payer: Self-pay | Admitting: Internal Medicine

## 2022-08-04 DIAGNOSIS — E1165 Type 2 diabetes mellitus with hyperglycemia: Secondary | ICD-10-CM

## 2022-08-18 ENCOUNTER — Encounter: Payer: Federal, State, Local not specified - PPO | Admitting: Obstetrics & Gynecology

## 2022-09-29 ENCOUNTER — Other Ambulatory Visit (HOSPITAL_COMMUNITY)
Admission: RE | Admit: 2022-09-29 | Discharge: 2022-09-29 | Disposition: A | Discharge status: Home / Self Care | Payer: Medicare Other | Source: Ambulatory Visit | Attending: Obstetrics and Gynecology | Admitting: Obstetrics and Gynecology

## 2022-09-29 ENCOUNTER — Encounter: Payer: Self-pay | Admitting: Obstetrics & Gynecology

## 2022-09-29 ENCOUNTER — Ambulatory Visit (INDEPENDENT_AMBULATORY_CARE_PROVIDER_SITE_OTHER): Payer: Medicare Other | Admitting: Obstetrics & Gynecology

## 2022-09-29 DIAGNOSIS — I2510 Atherosclerotic heart disease of native coronary artery without angina pectoris: Secondary | ICD-10-CM

## 2022-09-29 DIAGNOSIS — N950 Postmenopausal bleeding: Secondary | ICD-10-CM

## 2022-09-29 NOTE — Progress Notes (Signed)
   Established Patient Office Visit  Subjective   Patient ID: Betty [name], female    DOB: Jan 12, 1956  Age: 66 y.o. MRN: [id number]  No chief complaint on file.   HPI  66 yo divorced P3 (LC2) referred her 01/2022 for years worth of PMB. She is a "rambling historian" per the referring physician. It is difficult to determine exactly how long she has been bleeding. She had irregular periods during her younger days and received "hormone shots" to help with this in her younger days. She thinks she had menopause around age 72 but she is not sure.   She is not sexually active.  Objective:     There were no vitals taken for this visit.   Physical Exam  Well nourished, well hydrated White female, no apparent distress She is ambulating with a cane and conversing.   Consent signed, time out done Cervix prepped with betadine and sprayed with Hurricaine spray. I then grasped the anterior lip of the cervix with a single tooth tenaculum. Uterus sounded to 8 cm Pipelle used for 2 passes with a small amount of tissue obtained. She tolerated the procedure well.    Assessment & Plan:   Problem List Items Addressed This Visit   None   I have ordered a pelvic ultrasound and she will followup to discuss the results in the near future. She understands that the [facility] is to rule out uterine cancer and that she is at risk.   Allie Bossier, MD

## 2022-10-01 ENCOUNTER — Encounter: Payer: Self-pay | Admitting: Internal Medicine

## 2022-10-01 ENCOUNTER — Ambulatory Visit (INDEPENDENT_AMBULATORY_CARE_PROVIDER_SITE_OTHER): Payer: Medicare Other | Admitting: Internal Medicine

## 2022-10-01 VITALS — BP 130/72 | HR 81 | Ht 67.0 in | Wt 275.0 lb

## 2022-10-01 DIAGNOSIS — I2510 Atherosclerotic heart disease of native coronary artery without angina pectoris: Secondary | ICD-10-CM | POA: Diagnosis not present

## 2022-10-01 DIAGNOSIS — E1142 Type 2 diabetes mellitus with diabetic polyneuropathy: Secondary | ICD-10-CM | POA: Diagnosis not present

## 2022-10-01 DIAGNOSIS — E1159 Type 2 diabetes mellitus with other circulatory complications: Secondary | ICD-10-CM

## 2022-10-01 DIAGNOSIS — E1165 Type 2 diabetes mellitus with hyperglycemia: Secondary | ICD-10-CM | POA: Diagnosis not present

## 2022-10-01 DIAGNOSIS — E785 Hyperlipidemia, unspecified: Secondary | ICD-10-CM | POA: Diagnosis not present

## 2022-10-01 LAB — SURGICAL PATHOLOGY

## 2022-10-01 LAB — POCT GLYCOSYLATED HEMOGLOBIN (HGB A1C): Hemoglobin A1C: 7.4 % — AB (ref 4.0–5.6)

## 2022-10-01 MED ORDER — FREESTYLE LIBRE 3 SENSOR MISC: 1.0000 | 3 refills | Status: DC

## 2022-10-01 NOTE — Patient Instructions (Addendum)
Please continue: - Metformin XR 500 mg 2x a day - Ozempic 1 mg weekly   Breakfast  Lunch   Dinner bedtime  Insulin N: 36 units -  - 36 units  Insulin R: 20-22 units  20-22 units  20-22 units    -  For sugars: 71-90: take 16 units of R insulin 50-70: do not take R insulin  Try to get the [employer] CGM.  Please return in 4 months.

## 2022-10-01 NOTE — Progress Notes (Signed)
Patient ID: Betty [name], female   DOB: 1956/09/08, 66 y.o.   MRN: [id number]  Betty [name] is a 66 y.o. female, returning for f/u for DM2, dx >10 years ago, insulin-dependent, uncontrolled, with complications (CAD - s/p stent, peripheral neuropathy). Last visit 9 months ago.   She is still  contemplating moving to [place] to be closer to family. Her son accompanies her today and offers part of the information.   Interim history: She continues to not sleep well and has significant fatigue.   She also has generalized aches and pains.  Also, dizziness.   Reviewed HbA1c levels: Lab Results  Component Value Date   HGBA1C 7.7 (A) 01/09/2022   HGBA1C 7.9 (A) 07/22/2021   HGBA1C 7.5 (A) 01/09/2021   HGBA1C 7.7 (A) 04/19/2020   HGBA1C 8.0 (A) 09/21/2019   HGBA1C 7.3 (A) 08/22/2018   HGBA1C 7.0 (A) 05/03/2018   HGBA1C 7.6 12/31/2017   HGBA1C 7.0 06/17/2017   HGBA1C 7.3 03/09/2017     Pt is on a regimen of: - Metformin XR 500 mg 3 >> 2x a day - Victoza 1.2 mg daily >> Ozempic 0.5 >> 1 mg weekly   Breakfast  Lunch   Dinner bedtime  Insulin N: 36 units -  - 36 units  Insulin R: 22 >> 20 units with a  smaller meal                 24 >> 22 units wiith a  regular meal 22 >> 20 units with a  smaller meal 24 >> 22 units wiith a  regular meal 22 >> 20 units with a  smaller meal 24 >> 22 units wiith a  regular meal   -  For sugars: 71-90: take 16 units of R insulin 50-70: do not take R insulin   We tried Bydureon 2 mg weekly (added 08/2015) >> not covered Was previously on 70/30 Insulin: 70-20-30 (split in 3 b/c she was dropping her sugars overnight). On these, she was still having lows in am (40s). She was on Novolog in the past but could not afford it.  She was also on Amaryl and Avandia. She has bladder dysfxn - cannot try SGLT2 inh.  She had diarrhea with regular metformin.   Meter: [place]   Pt checks her sugars inconsistently-only has few checks in the last 2  months: - am: 94, 106-169, 192, 201 and 238 >> 94-144, 171, 194 >>131-270 >> 120, 152 - 2h after b'fast:  >> 145, 215 >> n/c >> 139-182, 219 >> 190 >> n/c >> 168 - before lunch:  110-140 >> 112-159, 202 >> 120-182, 213 >> 143-213>> 125, 181 - 2h after lunch: 109, 184 >> n/c >> 156 >> 140-189 >> n/c >> 92 (1.5 mo ago) - before dinner: 131, 178 >> 90-120s, 200 >> 168 >> n/c - bedtime: n/c >> 114, 160 >> 84, 122 >> n/c >> 140 >> n/c Lowest CBG: 60 >> ... 90s >> 35; she has hypoglycemia awareness in the 80s   No CKD, last BUN/creatinine was:  Lab Results  Component Value Date   BUN 9 06/11/2021   BUN 14 01/09/2021   Lab Results  Component Value Date   CREATININE 0.54 06/11/2021   CREATININE 0.72 01/09/2021  On ramipril.   + HL; Last set of lipids: Lab Results  Component Value Date   CHOL 102 01/09/2022   HDL 47 01/09/2022   LDLCALC 32 01/09/2022   LDLDIRECT 48 12/11/2019   TRIG  134 01/09/2022   CHOLHDL 2.2 01/09/2022  Prev. on Pravachol and she is not sure why she is not taking this anymore.  She was on Lipitor >> had mm cramps.   On fish oil.  Now also on Crestor 10.  She is also on Vascepa 2 g twice a day.   - eye exam: 2023: No DR reportedly, previously + DR + glaucoma, + cataracts. Dr Senaida Ores   -+ Numbness and tingling in her feet.  She was previously on Lyrica but had to stop due to somnolence.  She also had somnolence with the 300 mg Neurontin tablets >> then 100 mg daily >> then stopped -now back on it.  She sees neurology (for  vertigo). Sees Dr Yetta Barre (podiatrist).    She also has a history of HTN, OSA, obesity, OA - on Osteobiflex, vitamin D deficiency.   She developed severe nausea and vertigo on 04/10/2018. She was on Reglan (per Dr. Russella Dar) that helped with the nausea >> now off >> she was out of work because of this for a period of time.  She contacted our office and  I advised her to stop Victoza but her vertigo did not improve so we restarted the dose up.  Of note,  brain MRI was negative for a stroke. She had a stress test with Dr. Eden Emms as she had CP with exertion >> positive >> had a heart cath >> had a stent placed.  She was restarted on a statin afterwards.   ROS: + See HPI   I reviewed pt's medications, allergies, PMH, social hx, family hx, and changes were documented in the history of present illness. Otherwise, unchanged from my initial visit note.   Past Medical History:  Diagnosis Date   Arthritis    CAD, NATIVE VESSEL    a. S/p LAD stent in 2006 b. S/p DES to Lcx 08/2019 and patent LAD stent   DM (diabetes mellitus) (HCC)    Fibromyalgia    HYPERLIPIDEMIA-MIXED    HYPERTENSION, BENIGN    Neuropathy    Obesity    Osteoarthritis    Sleep apnea    Patient reported on 04/12/13   Vertigo    Past Surgical History:  Procedure Laterality Date   BREATH TEK H PYLORI N/A 03/08/2013   Procedure: BREATH TEK H PYLORI;  Surgeon: Atilano Ina, MD;  Location: [facility];  Service: General;  Laterality: N/A;   CESAREAN SECTION     CHOLECYSTECTOMY  1996   CORONARY ANGIOPLASTY WITH STENT PLACEMENT  11/2004   DES - LAD   CORONARY STENT INTERVENTION N/A 09/06/2019   Procedure: CORONARY STENT INTERVENTION;  Surgeon: Tonny Bollman, MD;  Location: [facility] INVASIVE CV LAB;  Service: Cardiovascular;  Laterality: N/A;   CORONARY STENT PLACEMENT  11/2004   RIGHT/LEFT HEART CATH AND CORONARY ANGIOGRAPHY N/A 09/06/2019   Procedure: RIGHT/LEFT HEART CATH AND CORONARY ANGIOGRAPHY;  Surgeon: Tonny Bollman, MD;  Location: [facility] INVASIVE CV LAB;  Service: Cardiovascular;  Laterality: N/A;   Social History   Socioeconomic History   Marital status: Divorced    Spouse name: Not on file   Number of children: 2   Years of education: 12   Highest education level: High school graduate  Occupational History   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   Right-handed.   Caffeine use:  She will sometimes drink up to three 20-oz bottles of soda per day.  Drinks 0.5 bottles of a 5-hour energy drink daily.   Social Determinants of Health   Financial Resource Strain: Not on file  Food Insecurity: Not on file  Transportation Needs: Not on file  Physical Activity: Not on file  Stress: Not on file  Social Connections: Not on file  Intimate Partner Violence: Not on file   Current Outpatient Medications on File Prior to Visit  Medication Sig Dispense Refill   acetaminophen (TYLENOL) 650 MG CR tablet Take 650-1,300 mg by mouth every 8 (eight) hours as needed for pain.      Aromatic Inhalants (VICKS VAPOINHALER IN) Place 1 Dose into both nostrils as needed (congestion.).     aspirin EC 81 MG tablet Take 81 mg by mouth daily.     beta carotene 63893 UNIT capsule Take 25,000 Units by mouth daily.       Calcium Carbonate-Vitamin D (CALCIUM-VITAMIN D) 600-200 MG-UNIT CAPS Take 1 tablet by mouth every evening.      carvedilol (COREG) 3.125 MG tablet Take 1 tablet (3.125 mg total) by mouth 2 (two) times daily with a meal. 180 tablet 2   cephALEXin (KEFLEX) 500 MG capsule Take 1 capsule (500 mg total) by mouth 4 (four) times daily. 28 capsule 0   cetirizine (ZYRTEC) 10 MG tablet Take 10 mg by mouth at bedtime.      Cholecalciferol (VITAMIN D3) 250 MCG (10000 UT) capsule Take 10,000 Units by mouth daily.     clopidogrel (PLAVIX) 75 MG tablet Take 1 tablet (75 mg total) by mouth daily. 90 tablet 3   diphenhydrAMINE (BENADRYL) 25 mg capsule Take 25 mg by mouth every 6 (six) hours as needed for itching.     gabapentin (NEURONTIN) 100 MG capsule TAKE 1 CAPSULE BY MOUTH TWICE DAILY 180 capsule 1   Homeopathic Products (THERAWORX RELIEF EX) Apply 1 application  topically 3 (three) times daily as needed (pain.).     icosapent Ethyl (VASCEPA) 1 g capsule Take 2 capsules (2 g total) by mouth 2 (two) times daily. 360 capsule 3   insulin NPH Human (NOVOLIN N) 100 UNIT/ML injection INJECTED 36 UNITS INTO SKIN 2 TIMES DAILY 70 mL 3   insulin regular (NOVOLIN R) 100 units/mL injection INJECT UP TO 100 UNITS     SUBCUTANEOUSLY DAILY 90 mL 1   Insulin Syringe-Needle U-100 (BD VEO INSULIN SYRINGE U/F) 31G X 15/64" 1 ML MISC USE 1 NEEDLE 4 TIMES DAILY AS ADVISED 400 each 3   Magnesium 250 MG TABS Take 250 mg by mouth daily.      meloxicam (MOBIC) 7.5 MG tablet Take 7.5 mg by mouth daily.     Menthol, Topical Analgesic, (ZIMS MAX-FREEZE EX) Apply 1 application topically 4 (four) times daily as needed (pain).     metFORMIN (GLUCOPHAGE-XR) 500 MG 24 hr tablet TAKE 1 TABLET BY MOUTH 3 TIMES DAILY WITH MEALS 270 tablet 1   Misc Natural Products (OSTEO BI-FLEX JOINT SHIELD PO) Take 1 tablet by mouth daily.       mupirocin ointment (BACTROBAN) 2 % Apply 1 application topically 2 (two) times daily. 30 g 2   nitroGLYCERIN (NITROSTAT) 0.4 MG SL tablet PLACE 1 TABLET UNDER THE TONGUE  EVERY 5 MINUTES AS NEEDED FOR CHEST PAIN UP TO 3 DOSES, IF SYMPTOMS PERSIST CALL 911 25 tablet 3   ondansetron (ZOFRAN) 4 MG tablet Take 4 mg by mouth every 8 (eight) hours as needed for nausea or vomiting.      OZEMPIC, 1 MG/DOSE, 4 MG/3ML SOPN INJECT 1MG  SUBCUTANEOUSLY  ONCE A WEEK 9 mL 3   Pumpkin Seed-Soy Germ (AZO BLADDER CONTROL/GO-LESS PO) Take 1 each by mouth 2 (two) times daily.      ramipril (ALTACE) 10 MG capsule TAKE 1 CAPSULE BY MOUTH DAILY 90 capsule 1   rosuvastatin (CRESTOR) 10 MG tablet TAKE 1 TABLET DAILY 90 tablet 3   solifenacin (VESICARE) 5 MG tablet Take 5 mg by mouth daily.     ULTRATRAK PRO TEST test strip TEST BLOOD SUGAR 5 TIMES DAILY AS INSTRUCTED 450 each 1   No current facility-administered medications on file prior to visit.   Allergies  Allergen Reactions    Lyrica [Pregabalin] Other (See Comments)    sleepy   Family History  Problem Relation Age of Onset   Heart disease Mother    Cancer Mother        ovarian   Arthritis Mother    Diabetes Mother    Hypertension Mother    Hypertension Father    Stroke Father    Diabetes Sister    Cancer Other    Coronary artery disease Other    Diabetes Other    PE: BP 130/72 (BP Location: Right Arm, Patient Position: Sitting, Cuff Size: Normal)   Pulse 81   Ht 5\' 7"  (1.702 m)   Wt 275 lb (124.7 kg)   SpO2 96%   BMI 43.07 kg/m  Wt Readings from Last 3 Encounters:  10/01/22 275 lb (124.7 kg)  01/09/22 270 lb 3.2 oz (122.6 kg)  12/11/21 272 lb (123.4 kg)    Constitutional: overweight, in NAD Eyes: no exophthalmos ENT: no masses palpated in neck, no cervical lymphadenopathy Cardiovascular: RRR, No MRG Respiratory: CTA B Musculoskeletal: no deformities Skin: no rashes Neurological: no tremor with outstretched hands   Diabetic Foot Exam - Simple   Simple Foot Form Diabetic Foot exam was performed with the following findings: Yes 10/01/2022  2:09 PM  Visual Inspection No deformities, no ulcerations, no other skin breakdown bilaterally: Yes Sensation Testing See comments: Yes Pulse Check Posterior Tibialis and Dorsalis pulse intact bilaterally: Yes Comments Patchy decrease in sensation on B feet. B pitting edema. Very dry skin. Hematoma under L hallux nail.     ASSESSMENT: 1. DM2, insulin-dependent, uncontrolled, with complications - CAD - s/p stent 11/2004 - Dr Eden EmmsNishan - peripheral neuropathy - DR She told me that her previous endocrinologist filled out FMLA forms 2/2 uncontrolled DM2, but I could not fill this since no major highs or lows or other issues.    2. PN   3. HL   4.  Obesity class III   PLAN:  1. DM2 -Patient with longstanding, insulin-dependent, type 2 diabetes, on basal/bolus insulin regimen, also metformin and GLP-1 receptor agonist, with improved control  after switching from Victoza to Ozempic.  At last visit, HbA1c improved to 7.7%.  It was previously higher after she had a period in which she was of insulin. -At today's visit, she is not checking sugars consistently.  Whenever checked, the sugars are at or slightly above goal.  We discussed about the importance of checking consistently and I also suggested a CGM.  I showed the patient and her  son the device and explained how it checks blood sugars and how it is placed on.  She has a smart phone and the son can download the application on her phone.  He does not live in town, but he can fix it for her before he leaves.  For now, I recommended to continue the current regimen. - I advised her to: Patient Instructions  Please continue: - Metformin XR 500 mg 2x a day - Ozempic 1 mg weekly   Breakfast  Lunch   Dinner bedtime  Insulin N: 36 units -  - 36 units  Insulin R: 20-22 units  20-22 units  20-22 units    -  For sugars: 71-90: take 16 units of R insulin 50-70: do not take R insulin  Try to get the [employer] CGM.  Please return in 4 months.   - we checked her HbA1c: 7.4% (improved) - advised to check sugars at different times of the day - 4x a day, rotating check times - advised for yearly eye exams >> she is UTD - return to clinic in 4 months   2. PN -stable -She previously had somnolence with a higher dose of Neurontin, 300 mg daily, currently on 100 mg twice a day -We did discuss about possible side effects of Neurontin and advised her to stay with a minimum dose possible   3. HL -Reviewed latest lipid panel from 12/2021: All fractions at goal: Lab Results  Component Value Date   CHOL 102 01/09/2022   HDL 47 01/09/2022   LDLCALC 32 01/09/2022   LDLDIRECT 48 12/11/2019   TRIG 134 01/09/2022   CHOLHDL 2.2 01/09/2022  -She continues on Crestor 10 mg daily and Vascepa 2 g twice a day without side effects.  Of note, she had muscle aches with Lipitor.  She then tried  pravastatin but stopped for an unknown reason.   4.  Obesity class III -We will continue Ozempic which should also help with weight loss -At last visit, weight was approximately stable. -She gained 5 pounds since then.   Carlus Pavlov, MD PhD [facility]

## 2022-10-02 ENCOUNTER — Encounter: Payer: Self-pay | Admitting: Obstetrics & Gynecology

## 2022-10-19 ENCOUNTER — Ambulatory Visit: Payer: Medicare Other | Admitting: Podiatry

## 2022-10-19 ENCOUNTER — Ambulatory Visit (INDEPENDENT_AMBULATORY_CARE_PROVIDER_SITE_OTHER): Payer: Medicare Other | Admitting: Podiatry

## 2022-10-19 DIAGNOSIS — L97519 Non-pressure chronic ulcer of other part of right foot with unspecified severity: Secondary | ICD-10-CM

## 2022-10-19 DIAGNOSIS — L97529 Non-pressure chronic ulcer of other part of left foot with unspecified severity: Secondary | ICD-10-CM | POA: Diagnosis not present

## 2022-10-19 DIAGNOSIS — B351 Tinea unguium: Secondary | ICD-10-CM | POA: Diagnosis not present

## 2022-10-19 DIAGNOSIS — E11621 Type 2 diabetes mellitus with foot ulcer: Secondary | ICD-10-CM | POA: Diagnosis not present

## 2022-10-19 DIAGNOSIS — L601 Onycholysis: Secondary | ICD-10-CM

## 2022-10-19 DIAGNOSIS — M79675 Pain in left toe(s): Secondary | ICD-10-CM

## 2022-10-19 DIAGNOSIS — M79674 Pain in right toe(s): Secondary | ICD-10-CM

## 2022-10-20 ENCOUNTER — Telehealth: Payer: Self-pay | Admitting: *Deleted

## 2022-10-20 MED ORDER — CEPHALEXIN 500 MG PO CAPS: 500.0000 mg | ORAL_CAPSULE | Freq: Three times a day (TID) | ORAL | 0 refills | Status: DC

## 2022-10-20 NOTE — Addendum Note (Signed)
Addended bySherryle Lis, Chelsia Serres R on: 10/20/2022 11:38 AM   Modules accepted: Orders

## 2022-10-20 NOTE — Progress Notes (Addendum)
  Subjective:  Patient ID: Betty [name], female    DOB: [birth year],  MRN: [id number]  Chief Complaint  Patient presents with   Nail Problem    Bilateral toenails are painful, lifting off / Diabetic    67 y.o. female presents with the above complaint. History confirmed with patient.  She returns for follow-up after a long period, her ulcerations have returned she wore a certain pair of shoes that were new few weeks ago and this rubbed ulcers and sores on to the tips of the great toe.  There is been bleeding coming from the left great toenail as well.  The nails are thick and elongated again.  Since her A1c is doing okay it is about 7.4%  Objective:  Physical Exam: Abnormal sensory exam with loss of protective sensation, she has palpable pulses, good capillary fill time.  Partial-thickness ulcerations with skin breakdown distal tip of bilateral hallux, she has onycholysis of the left great toenail and thickened elongated toenails x 10 Assessment:   1. Diabetic ulcer of left great toe ([place])   2. Diabetic ulcer of right great toe ([place])   3. Pain due to onychomycosis of toenails of both feet   4. Onycholysis      Plan:  Patient was evaluated and treated and all questions answered.  The ulcerations are fairly superficial and limited to breakdown of skin.  I recommended dressing daily with Silvadene ointment which she has, she will let me know if she needs a refill.  She has been using this since going to the emergency room in [place].  Advised on signs and symptoms of worsening infection.  Rx for Keflex sent to pharmacy  All nails were debrided in length thickness using a sharp nail nipper to a comfortable level to remove thickness and mycotic burden.  The left hallux nail was quite loose and required avulsion of the nail plate which was completed today, anesthetic was not required due to her neuropathy.  She taught this well it was dressed with Silvadene bandage and post care  instructions were given for this.  Return in about 4 weeks (around 11/16/2022) for wound care.

## 2022-10-20 NOTE — Telephone Encounter (Signed)
Patient is calling for the status of an antibiotic that was supposed to be sent to pharmacy on file, not there, please advise.

## 2022-10-28 ENCOUNTER — Other Ambulatory Visit: Payer: Medicare Other

## 2022-10-28 ENCOUNTER — Ambulatory Visit: Payer: Medicare Other | Admitting: Obstetrics & Gynecology

## 2022-11-16 ENCOUNTER — Ambulatory Visit: Payer: Federal, State, Local not specified - PPO | Admitting: Podiatry

## 2022-11-17 ENCOUNTER — Telehealth: Payer: Self-pay | Admitting: Obstetrics & Gynecology

## 2022-11-17 NOTE — Telephone Encounter (Signed)
I contacted patient unable  to leave detailed message due voicemail if full about new imaging appointment. I will send My chart message.  

## 2022-11-18 NOTE — Telephone Encounter (Signed)
I contacted patient unable  to leave detailed message due voicemail if full about new imaging appointment. I will send My chart message.

## 2022-11-23 NOTE — Telephone Encounter (Signed)
I contacted patient unable  to leave detailed message due voicemail if full about new imaging appointment. I will send My chart message.

## 2022-11-24 ENCOUNTER — Other Ambulatory Visit: Payer: Medicare Other

## 2022-11-24 ENCOUNTER — Ambulatory Visit: Admission: RE | Admit: 2022-11-24 | Payer: Medicare Other | Source: Ambulatory Visit

## 2022-11-24 ENCOUNTER — Ambulatory Visit: Payer: Medicare Other | Admitting: Obstetrics & Gynecology

## 2022-11-24 NOTE — Telephone Encounter (Signed)
Patient called to cancel imaging and office visit due to not feeling well. I contacted [place] with Centralized scheduling for rescheduled for imaging. Patient is rescheduled for [facility] at 5:30 pm and rescheduled to see Dr Hulan Fray for Tuesday, 2/27 at 3:35. Patient is aware of new locations, date and times and full bladder for ultrasound imaging.

## 2022-11-25 ENCOUNTER — Other Ambulatory Visit: Payer: Medicare Other

## 2022-11-26 ENCOUNTER — Ambulatory Visit: Payer: Medicare Other

## 2022-11-30 ENCOUNTER — Ambulatory Visit: Payer: Medicare Other

## 2022-12-02 ENCOUNTER — Ambulatory Visit: Payer: Medicare Other

## 2022-12-04 ENCOUNTER — Ambulatory Visit: Payer: Medicare Other

## 2022-12-07 ENCOUNTER — Ambulatory Visit: Payer: Medicare Other | Admitting: Podiatry

## 2022-12-08 ENCOUNTER — Ambulatory Visit: Payer: Medicare Other

## 2022-12-08 ENCOUNTER — Ambulatory Visit: Payer: Medicare Other | Admitting: Obstetrics & Gynecology

## 2022-12-10 ENCOUNTER — Ambulatory Visit: Payer: Medicare Other

## 2022-12-11 ENCOUNTER — Ambulatory Visit: Payer: Medicare Other

## 2022-12-14 ENCOUNTER — Ambulatory Visit (INDEPENDENT_AMBULATORY_CARE_PROVIDER_SITE_OTHER): Payer: Medicare Other | Admitting: Podiatry

## 2022-12-14 ENCOUNTER — Encounter: Payer: Self-pay | Admitting: Podiatry

## 2022-12-14 VITALS — BP 172/60 | HR 71

## 2022-12-14 DIAGNOSIS — L97529 Non-pressure chronic ulcer of other part of left foot with unspecified severity: Secondary | ICD-10-CM | POA: Diagnosis not present

## 2022-12-14 DIAGNOSIS — E11621 Type 2 diabetes mellitus with foot ulcer: Secondary | ICD-10-CM | POA: Diagnosis not present

## 2022-12-15 NOTE — Progress Notes (Signed)
  Subjective:  Patient ID: Betty [name], female    DOB: 1956/07/14,  MRN: [id number]  Chief Complaint  Patient presents with   Wound Check    "I don't know how it's doing. The left one has two spots on it and it's been bloody."    67 y.o. female presents with the above complaint. History confirmed with patient.  She says it is doing well she has been using the ointment  Objective:  Physical Exam: Abnormal sensory exam with loss of protective sensation, she has palpable pulses, good capillary fill time.  Right hallux has healed, left hallux full-thickness ulcer measuring 3 mm x 3 mm x 3 mm surrounding hyperkeratosis no signs of infection mild serous drainage, she has thickened elongated toenails x 10   Assessment:   1. Diabetic ulcer of left great toe [facility])      Plan:  Patient was evaluated and treated and all questions answered.  Ulcer left hallux -We discussed the etiology and factors that are a part of the wound healing process.  We also discussed the risk of infection both soft tissue and osteomyelitis from open ulceration.  Discussed the risk of limb loss if this happens or worsens. -Debridement as below. -Dressed with Iodosorb, DSD. -Continue home dressing changes daily with gentamicin ointment and 4 x 4 gauze and Coban   Procedure: Excisional Debridement of Wound Rationale: Removal of non-viable soft tissue from the wound to promote healing.  Anesthesia: none Post-Debridement Wound Measurements: 0.3 cm x 0.3 cm x 0.3 cm  Type of Debridement: Sharp Excisional Tissue Removed: Non-viable soft tissue Depth of Debridement: subcutaneous tissue. Technique: Sharp excisional debridement to bleeding, viable wound base.  Dressing: Dry, sterile, compression dressing. Disposition: Patient tolerated procedure well.    Return in about 1 month (around 01/14/2023) for wound care, at risk diabetic foot care.       Return in about 1 month (around 01/14/2023) for wound care, at  risk diabetic foot care.

## 2022-12-17 ENCOUNTER — Ambulatory Visit
Admission: RE | Admit: 2022-12-17 | Discharge: 2022-12-17 | Disposition: A | Discharge status: Home / Self Care | Payer: Medicare Other | Source: Ambulatory Visit | Attending: Obstetrics & Gynecology | Admitting: Obstetrics & Gynecology

## 2022-12-17 DIAGNOSIS — N950 Postmenopausal bleeding: Secondary | ICD-10-CM | POA: Diagnosis present

## 2022-12-18 ENCOUNTER — Ambulatory Visit (INDEPENDENT_AMBULATORY_CARE_PROVIDER_SITE_OTHER): Payer: Medicare Other | Admitting: Obstetrics & Gynecology

## 2022-12-18 ENCOUNTER — Encounter: Payer: Self-pay | Admitting: Obstetrics & Gynecology

## 2022-12-18 VITALS — BP 173/77 | HR 76 | Ht 67.0 in | Wt 265.0 lb

## 2022-12-18 DIAGNOSIS — N950 Postmenopausal bleeding: Secondary | ICD-10-CM | POA: Diagnosis not present

## 2022-12-18 NOTE — Progress Notes (Signed)
    GYNECOLOGY PROGRESS NOTE  Subjective:    Patient ID: Betty [name], female    DOB: [birth year], 67 y.o.   MRN: [id number]  HPI  Patient is a 67 y.o. No obstetric history on file. female who is here to discuss her pelvic ultrasound. I saw her 09/2022 for several years of PMB. I did an EMBX that day and the pathology was benign. Her ultrasound was just done recently. It showed a 7.2 mm endometrial lining.   A. ENDOMETRIUM, BIOPSY:  Superficial strips and fragments of inactive to atrophic endometrial  epithelium and endometrium  Scant benign endocervical epithelium  Negative for breakdown, polyp, atypia, hyperplasia and carcinoma   She continues to have intermittent PMB.  Review of Systems    Objective:   Blood pressure (!) 173/77, pulse 76, height 5\' 7"  (1.702 m), weight 265 lb (120.2 kg). Body mass index is 41.5 kg/m. Well nourished, well hydrated White female, no apparent distress BMI 41 She declines a pelvic exam today. Assessment:   PMB, 7 mm endometrium, benign path from [facility]  Plan:  At risk for uterine cancer, with continued bleeding I rec a consult with one of the other MDs here, consider hysteroscopy, d&c.

## 2022-12-31 ENCOUNTER — Other Ambulatory Visit: Payer: Self-pay

## 2022-12-31 MED ORDER — ROSUVASTATIN CALCIUM 10 MG PO TABS: 10.0000 mg | ORAL_TABLET | Freq: Every day | ORAL | 3 refills | Status: DC

## 2023-01-04 ENCOUNTER — Other Ambulatory Visit: Payer: Self-pay | Admitting: Internal Medicine

## 2023-01-04 DIAGNOSIS — E1142 Type 2 diabetes mellitus with diabetic polyneuropathy: Secondary | ICD-10-CM

## 2023-01-13 ENCOUNTER — Ambulatory Visit: Payer: Federal, State, Local not specified - PPO | Admitting: Podiatry

## 2023-01-18 ENCOUNTER — Ambulatory Visit: Payer: Federal, State, Local not specified - PPO | Admitting: Podiatry

## 2023-01-19 ENCOUNTER — Telehealth: Payer: Self-pay | Admitting: Obstetrics and Gynecology

## 2023-01-19 NOTE — Telephone Encounter (Signed)
I contacted the patient to confirm 4/11 with Dr.Evans. No answer and voicemail is full.

## 2023-01-21 ENCOUNTER — Ambulatory Visit: Payer: Medicare Other | Admitting: Obstetrics and Gynecology

## 2023-01-21 DIAGNOSIS — N950 Postmenopausal bleeding: Secondary | ICD-10-CM

## 2023-01-26 ENCOUNTER — Other Ambulatory Visit: Payer: Self-pay | Admitting: Internal Medicine

## 2023-02-04 ENCOUNTER — Ambulatory Visit: Payer: Medicare Other | Admitting: Internal Medicine

## 2023-02-05 ENCOUNTER — Ambulatory Visit: Payer: Medicare Other | Admitting: Internal Medicine

## 2023-02-05 NOTE — Progress Notes (Deleted)
Patient ID: Betty [name], female   DOB: [birth year], 67 y.o.   MRN: [id number]  Betty [name] is a 67 y.o. female, returning for f/u for DM2, dx >10 years ago, insulin-dependent, uncontrolled, with complications (CAD - s/p stent, peripheral neuropathy). Last visit 4 months ago.   She is still  contemplating moving to [place] to be closer to family. Her son accompanies her today and offers part of the information.   Interim history: She continues to not sleep well and has significant fatigue.   She also has generalized aches and pains.  Also, dizziness.   Reviewed HbA1c levels: Lab Results  Component Value Date   HGBA1C 7.4 (A) 10/01/2022   HGBA1C 7.7 (A) 01/09/2022   HGBA1C 7.9 (A) 07/22/2021   HGBA1C 7.5 (A) 01/09/2021   HGBA1C 7.7 (A) 04/19/2020   HGBA1C 8.0 (A) 09/21/2019   HGBA1C 7.3 (A) 08/22/2018   HGBA1C 7.0 (A) 05/03/2018   HGBA1C 7.6 12/31/2017   HGBA1C 7.0 06/17/2017     Pt is on a regimen of: - Metformin XR 500 mg 3 >> 2x a day - Victoza 1.2 mg daily >> Ozempic 0.5 >> 1 mg weekly   Breakfast  Lunch   Dinner bedtime  Insulin N: 36 units -  - 36 units  Insulin R: 22 >> 20 units with a  smaller meal                 24 >> 22 units wiith a  regular meal 22 >> 20 units with a  smaller meal 24 >> 22 units wiith a  regular meal 22 >> 20 units with a  smaller meal 24 >> 22 units wiith a  regular meal   -  For sugars: 71-90: take 16 units of R insulin 50-70: do not take R insulin   We tried Bydureon 2 mg weekly (added 08/2015) >> not covered Was previously on 70/30 Insulin: 70-20-30 (split in 3 b/c she was dropping her sugars overnight). On these, she was still having lows in am (40s). She was on Novolog in the past but could not afford it.  She was also on Amaryl and Avandia. She has bladder dysfxn - cannot try SGLT2 inh.  She had diarrhea with regular metformin.   Meter: [place]   Pt checks her sugars inconsistently: - am: 94, 106-169, 192, 201, 238  >> 94-144, 171, 194 >>131-270 >> 120, 152 - 2h after b'fast:  >> 145, 215 >> n/c >> 139-182, 219 >> 190 >> n/c >> 168 - before lunch:  110-140 >> 112-159, 202 >> 120-182, 213 >> 143-213>> 125, 181 - 2h after lunch: 109, 184 >> n/c >> 156 >> 140-189 >> n/c >> 92 (1.5 mo ago) - before dinner: 131, 178 >> 90-120s, 200 >> 168 >> n/c - bedtime: n/c >> 114, 160 >> 84, 122 >> n/c >> 140 >> n/c Lowest CBG: 60 >> ... 90s >> 73; she has hypoglycemia awareness in the 80s   No CKD, last BUN/creatinine was:  Lab Results  Component Value Date   BUN 9 06/11/2021   BUN 14 01/09/2021   Lab Results  Component Value Date   CREATININE 0.54 06/11/2021   CREATININE 0.72 01/09/2021  On ramipril.   + HL; Last set of lipids: Lab Results  Component Value Date   CHOL 102 01/09/2022   HDL 47 01/09/2022   LDLCALC 32 01/09/2022   LDLDIRECT 48 12/11/2019   TRIG 134 01/09/2022   CHOLHDL 2.2 01/09/2022  Prev. on Pravachol and she is not sure why she is not taking this anymore.  She was on Lipitor >> had mm cramps.   On fish oil.  Now also on Crestor 10.  She is also on Vascepa 2 g twice a day.   - eye exam: 2023: No DR reportedly, previously + DR + glaucoma, + cataracts. Dr Senaida Ores   -+ Numbness and tingling in her feet.  She was previously on Lyrica but had to stop due to somnolence.  She also had somnolence with the 300 mg Neurontin tablets >> then 100 mg daily >> then stopped -now back on it.  She sees neurology (for  vertigo). Sees Dr Yetta Barre (podiatrist).  Foot exams: 10/19/2022 and 12/14/2022.   She also has a history of HTN, OSA, obesity, OA - on Osteobiflex, vitamin D deficiency.   She developed severe nausea and vertigo on 04/10/2018. She was on Reglan (per Dr. Russella Dar) that helped with the nausea >> now off >> she was out of work because of this for a period of time.  She contacted our office and  I advised her to stop Victoza but her vertigo did not improve so we restarted the dose up.  Of note, brain MRI  was negative for a stroke. She had a stress test with Dr. Eden Emms as she had CP with exertion >> positive >> had a heart cath >> had a stent placed.  She was restarted on a statin afterwards.   ROS: + See HPI   I reviewed pt's medications, allergies, PMH, social hx, family hx, and changes were documented in the history of present illness. Otherwise, unchanged from my initial visit note.   Past Medical History:  Diagnosis Date   Arthritis    CAD, NATIVE VESSEL    a. S/p LAD stent in 2006 b. S/p DES to Lcx 08/2019 and patent LAD stent   DM (diabetes mellitus) (HCC)    Fibromyalgia    HYPERLIPIDEMIA-MIXED    HYPERTENSION, BENIGN    Neuropathy    Obesity    Osteoarthritis    Sleep apnea    Patient reported on 04/12/13   Vertigo    Past Surgical History:  Procedure Laterality Date   BREATH TEK H PYLORI N/A 03/08/2013   Procedure: BREATH TEK H PYLORI;  Surgeon: Atilano Ina, MD;  Location: [facility];  Service: General;  Laterality: N/A;   CESAREAN SECTION     CHOLECYSTECTOMY  1996   CORONARY ANGIOPLASTY WITH STENT PLACEMENT  11/2004   DES - LAD   CORONARY STENT INTERVENTION N/A 09/06/2019   Procedure: CORONARY STENT INTERVENTION;  Surgeon: Tonny Bollman, MD;  Location: [facility] INVAS[facility] CV LAB;  Service: Cardiovascular;  Laterality: N/A;   CORONARY STENT PLACEMENT  11/2004   RIGHT/LEFT HEART CATH AND CORONARY ANGIOGRAPHY N/A 09/06/2019   Procedure: RIGHT/LEFT HEART CATH AND CORONARY ANGIOGRAPHY;  Surgeon: Tonny Bollman, MD;  Location: [facility] INVASIVE CV LAB;  Service: Cardiovascular;  Laterality: N/A;   Social History   Socioeconomic History   Marital status: Divorced    Spouse name: Not on file   Number of children: 2   Years of education: 12   Highest education level: High school graduate  Occupational History   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   Right-handed.   Caffeine use:  She will sometimes drink up to three 20-oz bottles of soda per day.  Drinks 0.5 bottles of a 5-hour energy drink daily.   Social Determinants of Health   Financial Resource Strain: Not on file  Food Insecurity: Not on file  Transportation Needs: Not on file  Physical Activity: Not on file  Stress: Not on file  Social Connections: Not on file  Intimate Partner Violence: Not on file   Current Outpatient Medications on File Prior to Visit  Medication Sig Dispense Refill   acetaminophen (TYLENOL) 650 MG CR tablet Take 650-1,300 mg by mouth every 8 (eight) hours as needed for pain.      Aromatic Inhalants (VICKS VAPOINHALER IN) Place 1 Dose into both nostrils as needed (congestion.).     aspirin EC 81 MG tablet Take 81 mg by mouth daily.     beta carotene 40981 UNIT capsule Take 25,000 Units by mouth daily.       calcium carbonate (OS-CAL) 600 MG TABS tablet Take by mouth.     Calcium Carbonate-Vitamin D (CALCIUM-VITAMIN D) 600-200 MG-UNIT CAPS Take 1 tablet by mouth every evening.      carvedilol (COREG) 3.125 MG tablet Take 1 tablet (3.125 mg total) by mouth 2 (two) times daily with a meal. 180 tablet 2   cephALEXin (KEFLEX) 500 MG capsule Take 1 capsule (500 mg total) by mouth 3 (three) times daily. 30 capsule 0   cetirizine (ZYRTEC) 10 MG tablet Take 10 mg by mouth at bedtime.      Cholecalciferol (VITAMIN D3) 250 MCG (10000 UT) capsule Take 10,000 Units by mouth daily.     clopidogrel (PLAVIX) 75 MG tablet Take 1 tablet (75 mg total) by mouth daily. 90 tablet 3   Continuous Blood Gluc Sensor (FREESTYLE LIBRE 3 SENSOR) MISC 1 each by Does not apply route every 14 (fourteen) days. 6 each 3   diphenhydrAMINE (BENADRYL) 25 mg capsule Take 25 mg by mouth every 6 (six)  hours as needed for itching.     fluocinolone (SYNALAR) 0.01 % external solution SMARTSIG:sparingly Topical Twice Daily     gabapentin (NEURONTIN) 100 MG capsule TAKE 1 CAPSULE BY MOUTH TWICE DAILY 180 capsule 1   Homeopathic Products (THERAWORX RELIEF EX) Apply 1 application topically 3 (three) times daily as needed (pain.).     icosapent Ethyl (VASCEPA) 1 g capsule Take 2 capsules (2 g total) by mouth 2 (two) times daily. 360 capsule 3   insulin NPH Human (NOVOLIN N) 100 UNIT/ML injection INJECTED 36 UNITS INTO SKIN 2 TIMES DAILY 70 mL 3   insulin regular (NOVOLIN R) 100 units/mL injection INJECT UP TO 100 UNITS     SUBCUTANEOUSLY DAILY 90 mL 1   Insulin Syringe-Needle U-100 (BD VEO INSULIN SYRINGE U/F) 31G X 15/64" 1 ML MISC USE 1 NEEDLE 4 TIMES DAILY AS ADVISED 400 each 3   latanoprost (XALATAN) 0.005 % ophthalmic solution Apply to eye.     Magnesium 250 MG TABS Take 250 mg by mouth daily.      Magnesium Oxide -Mg Supplement ([employer] MAGNESIUM OXIDE) 250 MG TABS Take by mouth.     meloxicam (MOBIC) 7.5 MG tablet Take 7.5 mg by mouth daily.     Menthol, Topical Analgesic, (ZIMS MAX-FREEZE EX) Apply 1 application topically 4 (four) times  daily as needed (pain).     metFORMIN (GLUCOPHAGE-XR) 500 MG 24 hr tablet TAKE 1 TABLET BY MOUTH 3 TIMES DAILY WITH MEALS 270 tablet 1   Misc Natural Products (OSTEO BI-FLEX JOINT SHIELD PO) Take 1 tablet by mouth daily.       mupirocin ointment (BACTROBAN) 2 % Apply 1 application topically 2 (two) times daily. 30 g 2   MYRBETRIQ 25 MG TB24 tablet Take 25 mg by mouth daily.     nitroGLYCERIN (NITROSTAT) 0.4 MG SL tablet PLACE 1 TABLET UNDER THE TONGUE EVERY 5 MINUTES AS NEEDED FOR CHEST PAIN UP TO 3 DOSES, IF SYMPTOMS PERSIST CALL 911 25 tablet 3   ondansetron (ZOFRAN) 4 MG tablet Take 4 mg by mouth every 8 (eight) hours as needed for nausea or vomiting.      OZEMPIC, 1 MG/DOSE, 4 MG/3ML SOPN INJECT 1MG  SUBCUTANEOUSLY  ONCE A WEEK 9 mL 3   Pumpkin Seed-Soy Germ  (AZO BLADDER CONTROL/GO-LESS PO) Take 1 each by mouth 2 (two) times daily.      ramipril (ALTACE) 10 MG capsule TAKE 1 CAPSULE BY MOUTH DAILY 90 capsule 1   rosuvastatin (CRESTOR) 10 MG tablet Take 1 tablet (10 mg total) by mouth daily. 90 tablet 3   solifenacin (VESICARE) 5 MG tablet Take 5 mg by mouth daily.     ULTRATRAK PRO TEST test strip TEST BLOOD SUGAR 5 TIMES DAILY AS INSTRUCTED 450 each 1   No current facility-administered medications on file prior to visit.   Allergies  Allergen Reactions   Lyrica [Pregabalin] Other (See Comments)    sleepy   Family History  Problem Relation Age of Onset   Heart disease Mother    Cancer Mother        ovarian   Arthritis Mother    Diabetes Mother    Hypertension Mother    Hypertension Father    Stroke Father    Diabetes Sister    Cancer Other    Coronary artery disease Other    Diabetes Other    PE: There were no vitals taken for this visit. Wt Readings from Last 3 Encounters:  12/18/22 265 lb (120.2 kg)  10/01/22 275 lb (124.7 kg)  01/09/22 270 lb 3.2 oz (122.6 kg)    Constitutional: overweight, in NAD Eyes: no exophthalmos ENT: no masses palpated in neck, no cervical lymphadenopathy Cardiovascular: RRR, No MRG Respiratory: CTA B Musculoskeletal: no deformities Skin: no rashes Neurological: no tremor with outstretched hands  ASSESSMENT: 1. DM2, insulin-dependent, uncontrolled, with complications - CAD - s/p stent 11/2004 - Dr Eden Emms - peripheral neuropathy - DR She told me that her previous endocrinologist filled out FMLA forms 2/2 uncontrolled DM2, but I could not fill this since no major highs or lows or other issues.    2. PN   3. HL   4.  Obesity class III   PLAN:  1. DM2 -Patient with longstanding, insulin-dependent type 2 diabetes, basal/bolus insulin regimen and also metformin and weekly GLP-1 receptor agonist, with improved control after switching from Victoza to Ozempic.  At last visit, HbA1c was 7.4%,  lower.  At last visit, she was not checking sugars consistently and whenever checked, they were slightly above goal.  I recommended a CGM.  We otherwise did not change the regimen. - I advised her to: Patient Instructions  Please continue: - Metformin XR 500 mg 2x a day - Ozempic 1 mg weekly   Breakfast  Lunch   Dinner bedtime  Insulin  N: 36 units -  - 36 units  Insulin R: 20-22 units  20-22 units  20-22 units    -  For sugars: 71-90: take 16 units of R insulin 50-70: do not take R insulin  Try to get the Freestyle Libre CGM.  Please return in 4 months.   - we checked her HbA1c: 7%  - advised to check sugars at different times of the day - 4x a day, rotating check times - advised for yearly eye exams >> she is UTD - return to clinic in 4 months   2. PN -stable -She previously had somnolence with a higher dose of Neurontin, 300 mg daily, then decrease to 10 mg twice a day -We did discuss about possible side effects of Neurontin and advised her to stay with a minimum dose possible   3. HL -Latest lipid panel from 12/2021: Fractions at goal: Lab Results  Component Value Date   CHOL 102 01/09/2022   HDL 47 01/09/2022   LDLCALC 32 01/09/2022   LDLDIRECT 48 12/11/2019   TRIG 134 01/09/2022   CHOLHDL 2.2 01/09/2022  -She continues on Crestor 10 mg daily and Vascepa 2 g twice a day without side effects.  Of note, she had muscle aches with Lipitor.  She then tried pravastatin but stopped for an unknown reason. -She is due for another lipid panel   4.  Obesity class III -Will continue Ozempic which should also help with weight loss -She gained 5 pounds before last visit, however, since then, she lost 10 pounds   Carlus Pavlov, MD PhD [facility]

## 2023-02-09 ENCOUNTER — Ambulatory Visit (INDEPENDENT_AMBULATORY_CARE_PROVIDER_SITE_OTHER): Payer: Medicare Other | Admitting: Obstetrics and Gynecology

## 2023-02-09 ENCOUNTER — Encounter: Payer: Self-pay | Admitting: Obstetrics and Gynecology

## 2023-02-09 VITALS — BP 145/79 | HR 76 | Ht 67.0 in | Wt 266.0 lb

## 2023-02-09 DIAGNOSIS — N952 Postmenopausal atrophic vaginitis: Secondary | ICD-10-CM | POA: Diagnosis not present

## 2023-02-09 DIAGNOSIS — N950 Postmenopausal bleeding: Secondary | ICD-10-CM | POA: Diagnosis not present

## 2023-02-09 MED ORDER — ESTRADIOL 0.1 MG/GM VA CREA: 0.2500 | TOPICAL_CREAM | VAGINAL | 0 refills | Status: DC

## 2023-02-09 NOTE — Progress Notes (Signed)
Patient presents today to discuss the need for a D&C. She states still experiencing post menopausal spotting 2-3 days out of the month since biopsy in December.

## 2023-02-09 NOTE — Progress Notes (Signed)
HPI:      Betty [name] is a 67 y.o. G3P2 who LMP was No LMP recorded. Patient is postmenopausal.  Subjective:   She presents today to discuss postmenopausal bleeding.  She states that she has 1 day of spotting per month.  This has been going on for several months.  In December she had an endometrial biopsy that was negative.  Last month she had an ultrasound which showed a 7 mm lining of the uterus. She is not currently sexually active. She has strongly expressed her desire not to have surgery if possible.    Hx: The following portions of the patient's history were reviewed and updated as appropriate:             She  has a past medical history of Arthritis, CAD, NATIVE VESSEL, DM (diabetes mellitus) (HCC), Fibromyalgia, HYPERLIPIDEMIA-MIXED, HYPERTENSION, BENIGN, Neuropathy, Obesity, Osteoarthritis, Sleep apnea, and Vertigo. She does not have any pertinent problems on file. She  has a past surgical history that includes Coronary angioplasty with stent (11/2004); Cesarean section; Cholecystectomy (1996); Coronary stent placement (11/2004); Breath tek h pylori (N/A, 03/08/2013); RIGHT/LEFT HEART CATH AND CORONARY ANGIOGRAPHY (N/A, 09/06/2019); and CORONARY STENT INTERVENTION (N/A, 09/06/2019). Her family history includes Arthritis in her mother; Cancer in her mother and another family member; Coronary artery disease in an other family member; Diabetes in her mother, sister, and another family member; Heart disease in her mother; Hypertension in her father and mother; Stroke in her father. She  reports that she has never smoked. She has never used smokeless tobacco. She reports that she does not drink alcohol and does not use drugs. She has a current medication list which includes the following prescription(s): acetaminophen, aromatic inhalants, aspirin ec, beta carotene, calcium carbonate, calcium-vitamin d, carvedilol, cephalexin, cetirizine, vitamin d3, clopidogrel, freestyle libre 3  sensor, diphenhydramine, [START ON 02/10/2023] estradiol, fluocinolone, gabapentin, homeopathic products, icosapent ethyl, insulin nph human, insulin regular, bd veo insulin syringe u/f, latanoprost, magnesium, magnesium oxide -mg supplement, meloxicam, menthol (topical analgesic), metformin, misc natural products, mupirocin ointment, myrbetriq, nitroglycerin, ondansetron, ozempic (1 mg/dose), pumpkin seed-soy germ, ramipril, rosuvastatin, solifenacin, and ultratrak pro test. She is allergic to lyrica [pregabalin].       Review of Systems:  Review of Systems  Constitutional: Denied constitutional symptoms, night sweats, recent illness, fatigue, fever, insomnia and weight loss.  Eyes: Denied eye symptoms, eye pain, photophobia, vision change and visual disturbance.  Ears/Nose/Throat/Neck: Denied ear, nose, throat or neck symptoms, hearing loss, nasal discharge, sinus congestion and sore throat.  Cardiovascular: Denied cardiovascular symptoms, arrhythmia, chest pain/pressure, edema, exercise intolerance, orthopnea and palpitations.  Respiratory: Denied pulmonary symptoms, asthma, pleuritic pain, productive sputum, cough, dyspnea and wheezing.  Gastrointestinal: Denied, gastro-esophageal reflux, melena, nausea and vomiting.  Genitourinary: See HPI for additional information.  Musculoskeletal: Denied musculoskeletal symptoms, stiffness, swelling, muscle weakness and myalgia.  Dermatologic: Denied dermatology symptoms, rash and scar.  Neurologic: Denied neurology symptoms, dizziness, headache, neck pain and syncope.  Psychiatric: Denied psychiatric symptoms, anxiety and depression.  Endocrine: Denied endocrine symptoms including hot flashes and night sweats.   Meds:   Current Outpatient Medications on File Prior to Visit  Medication Sig Dispense Refill   acetaminophen (TYLENOL) 650 MG CR tablet Take 650-1,300 mg by mouth every 8 (eight) hours as needed for pain.      Aromatic Inhalants (VICKS  VAPOINHALER IN) Place 1 Dose into both nostrils as needed (congestion.).     aspirin EC 81 MG tablet Take 81 mg by mouth  daily.     beta carotene 09811 UNIT capsule Take 25,000 Units by mouth daily.       calcium carbonate (OS-CAL) 600 MG TABS tablet Take by mouth.     Calcium Carbonate-Vitamin D (CALCIUM-VITAMIN D) 600-200 MG-UNIT CAPS Take 1 tablet by mouth every evening.      carvedilol (COREG) 3.125 MG tablet Take 1 tablet (3.125 mg total) by mouth 2 (two) times daily with a meal. 180 tablet 2   cephALEXin (KEFLEX) 500 MG capsule Take 1 capsule (500 mg total) by mouth 3 (three) times daily. 30 capsule 0   cetirizine (ZYRTEC) 10 MG tablet Take 10 mg by mouth at bedtime.      Cholecalciferol (VITAMIN D3) 250 MCG (10000 UT) capsule Take 10,000 Units by mouth daily.     clopidogrel (PLAVIX) 75 MG tablet Take 1 tablet (75 mg total) by mouth daily. 90 tablet 3   Continuous Blood Gluc Sensor (FREESTYLE LIBRE 3 SENSOR) MISC 1 each by Does not apply route every 14 (fourteen) days. 6 each 3   diphenhydrAMINE (BENADRYL) 25 mg capsule Take 25 mg by mouth every 6 (six) hours as needed for itching.     fluocinolone (SYNALAR) 0.01 % external solution SMARTSIG:sparingly Topical Twice Daily     gabapentin (NEURONTIN) 100 MG capsule TAKE 1 CAPSULE BY MOUTH TWICE DAILY 180 capsule 1   Homeopathic Products (THERAWORX RELIEF EX) Apply 1 application topically 3 (three) times daily as needed (pain.).     icosapent Ethyl (VASCEPA) 1 g capsule Take 2 capsules (2 g total) by mouth 2 (two) times daily. 360 capsule 3   insulin NPH Human (NOVOLIN N) 100 UNIT/ML injection INJECTED 36 UNITS INTO SKIN 2 TIMES DAILY 70 mL 3   insulin regular (NOVOLIN R) 100 units/mL injection INJECT UP TO 100 UNITS     SUBCUTANEOUSLY DAILY 90 mL 1   Insulin Syringe-Needle U-100 (BD VEO INSULIN SYRINGE U/F) 31G X 15/64" 1 ML MISC USE 1 NEEDLE 4 TIMES DAILY AS ADVISED 400 each 3   latanoprost (XALATAN) 0.005 % ophthalmic solution Apply to eye.      Magnesium 250 MG TABS Take 250 mg by mouth daily.      Magnesium Oxide -Mg Supplement ([employer] MAGNESIUM OXIDE) 250 MG TABS Take by mouth.     meloxicam (MOBIC) 7.5 MG tablet Take 7.5 mg by mouth daily.     Menthol, Topical Analgesic, (ZIMS MAX-FREEZE EX) Apply 1 application topically 4 (four) times daily as needed (pain).     metFORMIN (GLUCOPHAGE-XR) 500 MG 24 hr tablet TAKE 1 TABLET BY MOUTH 3 TIMES DAILY WITH MEALS 270 tablet 1   Misc Natural Products (OSTEO BI-FLEX JOINT SHIELD PO) Take 1 tablet by mouth daily.       mupirocin ointment (BACTROBAN) 2 % Apply 1 application topically 2 (two) times daily. 30 g 2   MYRBETRIQ 25 MG TB24 tablet Take 25 mg by mouth daily.     nitroGLYCERIN (NITROSTAT) 0.4 MG SL tablet PLACE 1 TABLET UNDER THE TONGUE EVERY 5 MINUTES AS NEEDED FOR CHEST PAIN UP TO 3 DOSES, IF SYMPTOMS PERSIST CALL 911 25 tablet 3   ondansetron (ZOFRAN) 4 MG tablet Take 4 mg by mouth every 8 (eight) hours as needed for nausea or vomiting.      OZEMPIC, 1 MG/DOSE, 4 MG/3ML SOPN INJECT 1MG  SUBCUTANEOUSLY  ONCE A WEEK 9 mL 3   Pumpkin Seed-Soy Germ (AZO BLADDER CONTROL/GO-LESS PO) Take 1 each by mouth 2 (two) times daily.  ramipril (ALTACE) 10 MG capsule TAKE 1 CAPSULE BY MOUTH DAILY 90 capsule 1   rosuvastatin (CRESTOR) 10 MG tablet Take 1 tablet (10 mg total) by mouth daily. 90 tablet 3   solifenacin (VESICARE) 5 MG tablet Take 5 mg by mouth daily.     ULTRATRAK PRO TEST test strip TEST BLOOD SUGAR 5 TIMES DAILY AS INSTRUCTED 450 each 1   No current facility-administered medications on file prior to visit.      Objective:     Vitals:   02/09/23 1512  BP: (!) 145/79  Pulse: 76   Filed Weights   02/09/23 1512  Weight: 266 lb (120.7 kg)                        Assessment:    G3P2 Patient Active Problem List   Diagnosis Date Noted   Abnormal nuclear cardiac imaging test 09/06/2019   Cerebellar ataxia in diseases classified elsewhere (HCC) 08/06/2018   Gait  abnormality 08/02/2018   Diabetic peripheral neuropathy (HCC) 06/17/2017   Poorly controlled type 2 diabetes mellitus with circulatory disorder (HCC) 03/06/2016   Osteoarthritis    Obesity    Sleep apnea    Hyperlipidemia 07/31/2009   HYPERTENSION, BENIGN 07/31/2009   CAD, NATIVE VESSEL 07/31/2009     1. PMB (postmenopausal bleeding)   2. Vaginal atrophy     Patient with a negative endometrial biopsy December.  Ultrasound of 7 mm endometrium.  I believe this makes her very low risk for endometrial cancer.  Not 0 but a very low risk.  She would like to avoid surgery.  Bleeding may be from vaginal atrophy.   Plan:            1.  We have discussed multiple options including D&C for definitive diagnosis and patient would rather avoid surgery if possible.  Based on her relatively low absolute risk she has decided to try endometrial vaginal cream for 2 months and see if her spotting resolves.  Obviously this is a treatment for her atrophy.  Orders No orders of the defined types were placed in this encounter.    Meds ordered this encounter  Medications   estradiol (ESTRACE) 0.1 MG/GM vaginal cream    Sig: Place 0.25 Applicatorfuls vaginally 3 (three) times a week.    Dispense:  90 g    Refill:  0      F/U  Return in about 2 months (around 04/11/2023). I spent 25 minutes involved in the care of this patient preparing to see the patient by obtaining and reviewing her medical history (including labs, imaging tests and prior procedures), documenting clinical information in the electronic health record (EHR), counseling and coordinating care plans, writing and sending prescriptions, ordering tests or procedures and in direct communicating with the patient and medical staff discussing pertinent items from her history and physical exam.  Elonda Husky, M.D. 02/09/2023 3:50 PM

## 2023-02-17 ENCOUNTER — Ambulatory Visit: Payer: Federal, State, Local not specified - PPO | Admitting: Podiatry

## 2023-02-20 ENCOUNTER — Other Ambulatory Visit: Payer: Self-pay | Admitting: Internal Medicine

## 2023-02-20 DIAGNOSIS — E1159 Type 2 diabetes mellitus with other circulatory complications: Secondary | ICD-10-CM

## 2023-02-24 ENCOUNTER — Ambulatory Visit: Payer: Federal, State, Local not specified - PPO | Admitting: Podiatry

## 2023-03-01 ENCOUNTER — Other Ambulatory Visit: Payer: Self-pay | Admitting: Internal Medicine

## 2023-03-02 ENCOUNTER — Other Ambulatory Visit: Payer: Self-pay

## 2023-03-02 DIAGNOSIS — E1165 Type 2 diabetes mellitus with hyperglycemia: Secondary | ICD-10-CM

## 2023-03-02 MED ORDER — OZEMPIC (1 MG/DOSE) 4 MG/3ML ~~LOC~~ SOPN: PEN_INJECTOR | SUBCUTANEOUS | 0 refills | Status: DC

## 2023-03-02 MED ORDER — INSULIN NPH (HUMAN) (ISOPHANE) 100 UNIT/ML ~~LOC~~ SUSP
SUBCUTANEOUS | 0 refills | Status: DC
Start: 2023-03-02 — End: 2023-03-26

## 2023-03-02 MED ORDER — INSULIN REGULAR HUMAN 100 UNIT/ML IJ SOLN
INTRAMUSCULAR | 0 refills | Status: DC
Start: 2023-03-02 — End: 2023-06-04

## 2023-03-03 ENCOUNTER — Ambulatory Visit (INDEPENDENT_AMBULATORY_CARE_PROVIDER_SITE_OTHER): Payer: Medicare Other | Admitting: Podiatry

## 2023-03-03 DIAGNOSIS — B351 Tinea unguium: Secondary | ICD-10-CM

## 2023-03-03 DIAGNOSIS — M79675 Pain in left toe(s): Secondary | ICD-10-CM | POA: Diagnosis not present

## 2023-03-03 DIAGNOSIS — L97529 Non-pressure chronic ulcer of other part of left foot with unspecified severity: Secondary | ICD-10-CM

## 2023-03-03 DIAGNOSIS — M79674 Pain in right toe(s): Secondary | ICD-10-CM

## 2023-03-03 DIAGNOSIS — E11621 Type 2 diabetes mellitus with foot ulcer: Secondary | ICD-10-CM | POA: Diagnosis not present

## 2023-03-03 NOTE — Progress Notes (Signed)
  Subjective:  Patient ID: Betty [name], female    DOB: 08/28/1956,  MRN: [id number]  Chief Complaint  Patient presents with   Diabetic Ulcer    Left great toe, 2 month follow up for at risk diabetic foot care    67 y.o. female presents with the above complaint. History confirmed with patient.  She had to move and missed her previous appointments, she noticed that it was tearing skin off, she was not wearing shoes walking around the house  Objective:  Physical Exam: Abnormal sensory exam with loss of protective sensation, she has palpable pulses, good capillary fill time.  Right hallux has healed, left hallux full-thickness ulcer measuring 7 x 7 mm x 3 mm surrounding hyperkeratosis no signs of infection mild serous drainage, she has thickened elongated toenails x 10    Assessment:   1. Diabetic ulcer of left great toe (HCC)   2. Pain due to onychomycosis of toenails of both feet      Plan:  Patient was evaluated and treated and all questions answered.  Ulcer left hallux -We discussed the etiology and factors that are a part of the wound healing process.  We also discussed the risk of infection both soft tissue and osteomyelitis from open ulceration.  Discussed the risk of limb loss if this happens or worsens. -Debridement as below. -Dressed with povidone ointment, DSD. -Continue home dressing changes daily with gentamicin ointment and 4 x 4 gauze and Coban   Procedure: Excisional Debridement of Wound Rationale: Removal of non-viable soft tissue from the wound to promote healing.  Anesthesia: none Post-Debridement Wound Measurements: Noted above Type of Debridement: Sharp Excisional Tissue Removed: Non-viable soft tissue Depth of Debridement: subcutaneous tissue. Technique: Sharp excisional debridement to bleeding, viable wound base.  Dressing: Dry, sterile, compression dressing. Disposition: Patient tolerated procedure well.   Discussed the etiology and treatment  options for the condition in detail with the patient.  Prior nail debridement has been helpful in reducing pain and improving function, she also has risk factors of diabetes and neuropathy making is medically necessary.  Recommended debridement of the nails today. Sharp and mechanical debridement performed of all painful and mycotic nails today. Nails debrided in length and thickness using a nail nipper to level of comfort. Discussed treatment options including appropriate shoe gear. Follow up as needed for painful nails.   Return in about 3 weeks (around 03/24/2023) for wound care.       Return in about 3 weeks (around 03/24/2023) for wound care.

## 2023-03-05 ENCOUNTER — Other Ambulatory Visit: Payer: Self-pay | Admitting: Internal Medicine

## 2023-03-05 DIAGNOSIS — E1165 Type 2 diabetes mellitus with hyperglycemia: Secondary | ICD-10-CM

## 2023-03-17 ENCOUNTER — Encounter: Payer: Self-pay | Admitting: Internal Medicine

## 2023-03-20 ENCOUNTER — Other Ambulatory Visit: Payer: Self-pay | Admitting: Cardiovascular Disease

## 2023-03-22 ENCOUNTER — Ambulatory Visit: Payer: Federal, State, Local not specified - PPO | Admitting: Podiatry

## 2023-03-26 ENCOUNTER — Other Ambulatory Visit: Payer: Self-pay | Admitting: Internal Medicine

## 2023-03-26 DIAGNOSIS — E1165 Type 2 diabetes mellitus with hyperglycemia: Secondary | ICD-10-CM

## 2023-03-28 IMAGING — CT CT HEAD W/O CM
4 series · 16 of 47 positions shown, 18 images · non-contrast
Comparison: MRI brain 08/10/2018 and CT head 04/11/2018

CLINICAL DATA: Foot pain and neuropathy. Difficulty walking.
Left-sided weakness for 2 weeks.

EXAM:
CT HEAD WITHOUT CONTRAST
TECHNIQUE: Contiguous axial images were obtained from the base of the skull
through the vertex without intravenous contrast.

[Series 2: head wo · axial · 0.42mm/px · z∈[-92,+23]mm · 7 of 31 slices shown, 9 images]
[im 4/31  brain]
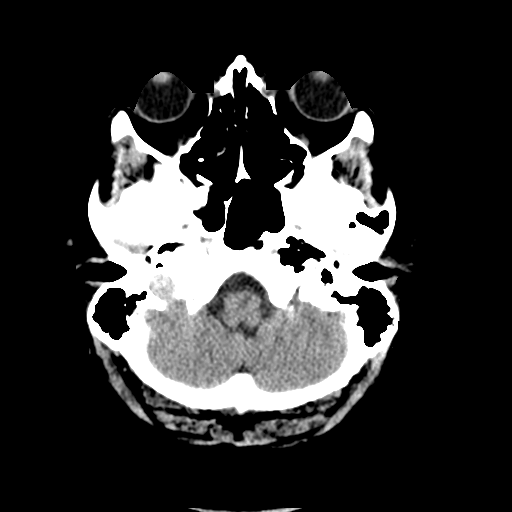
[im 4/31  bone]
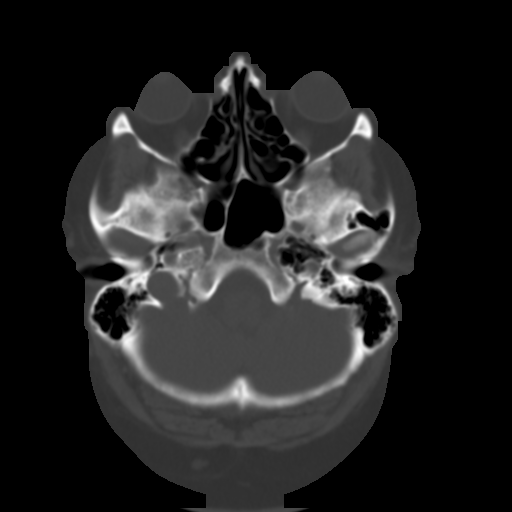
[im 8/31  brain]
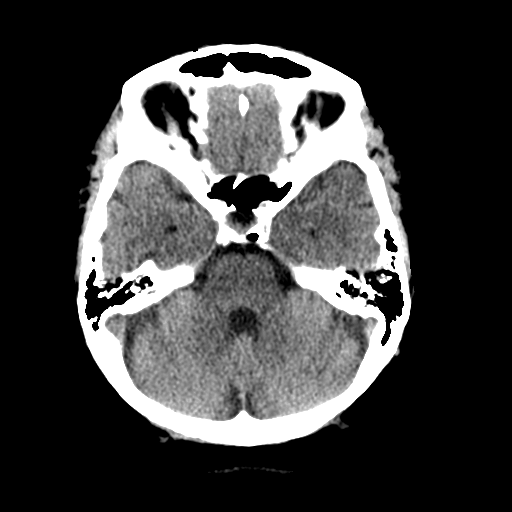
[im 12/31  brain]
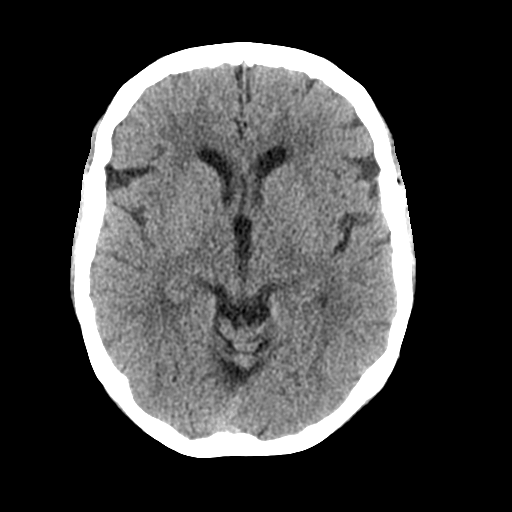
[im 16/31  brain]
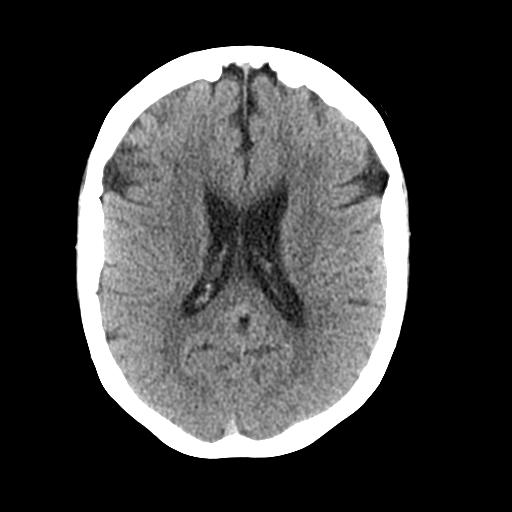
[im 19/31  brain]
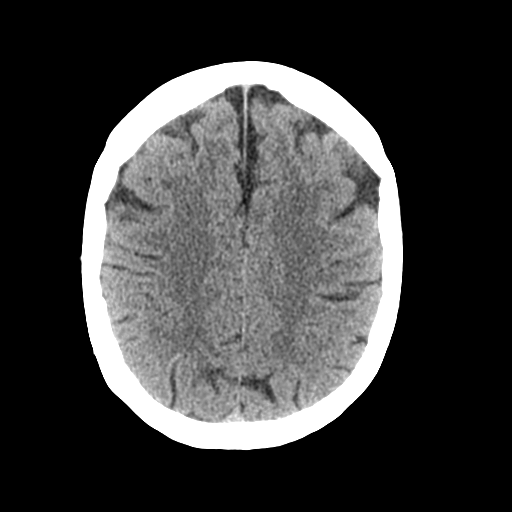
[im 19/31  bone]
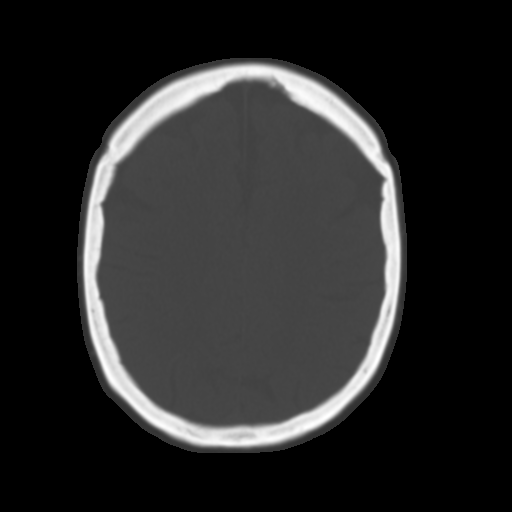
[im 23/31  brain]
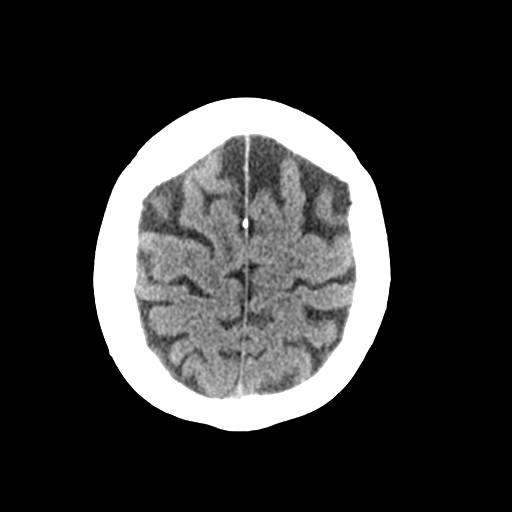
[im 27/31  brain]
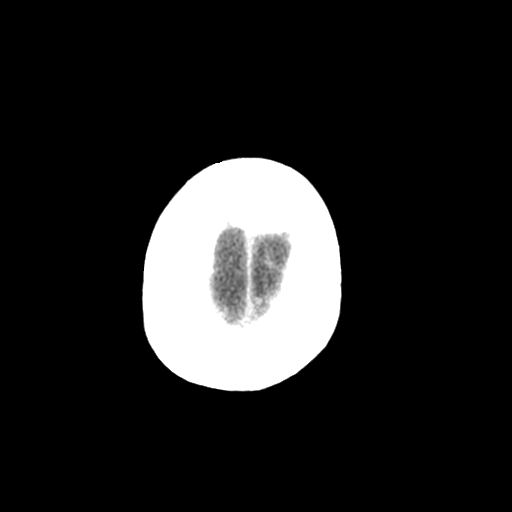

[Series 3: head bone · axial · 0.42mm/px · z∈[-93,-63]mm · 3 of 77 slices shown]
[im 8/77  bone]
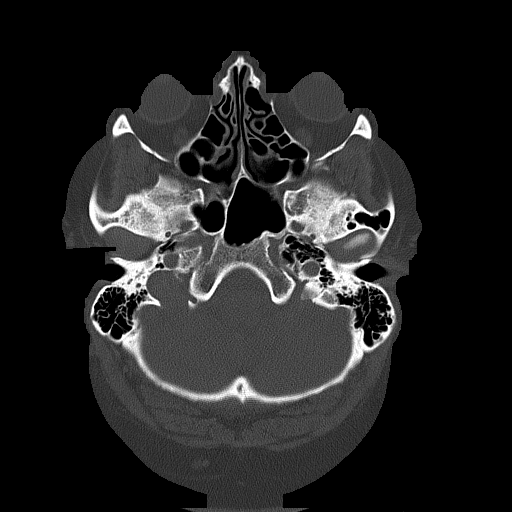
[im 16/77  bone]
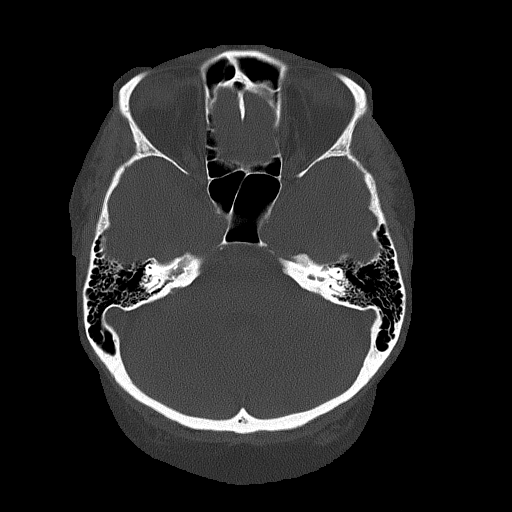
[im 23/77  bone]
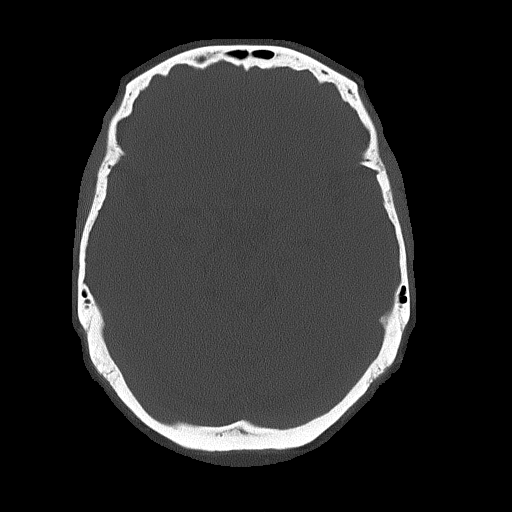

[Series 4: coronal soft tissue · coronal · 0.32mm/px · 3 of 64 slices shown]
[im 22/64  brain]
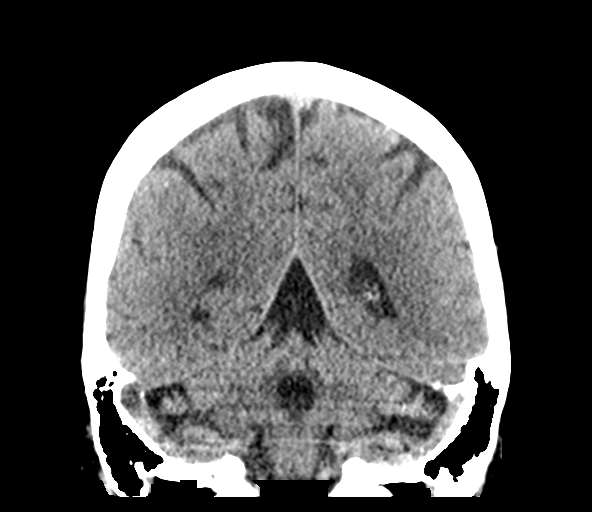
[im 29/64  brain]
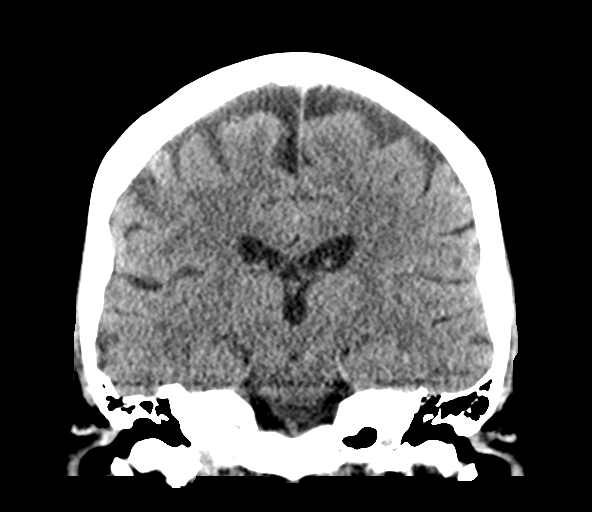
[im 36/64  brain]
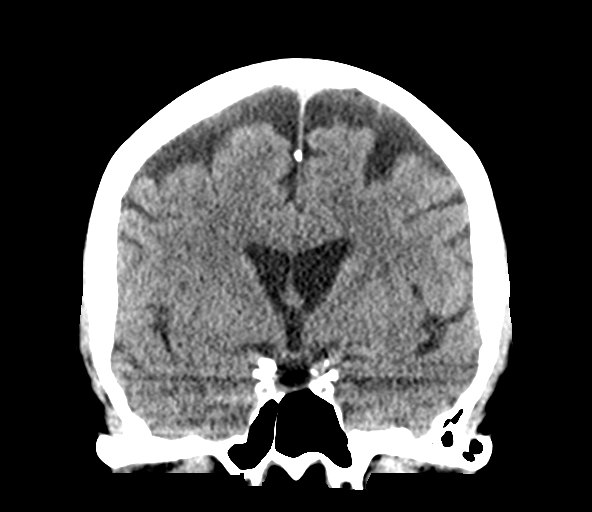

[Series 5: sagittal soft tissue · sagittal · 0.30mm/px · 3 of 53 slices shown]
[im 18/53  brain]
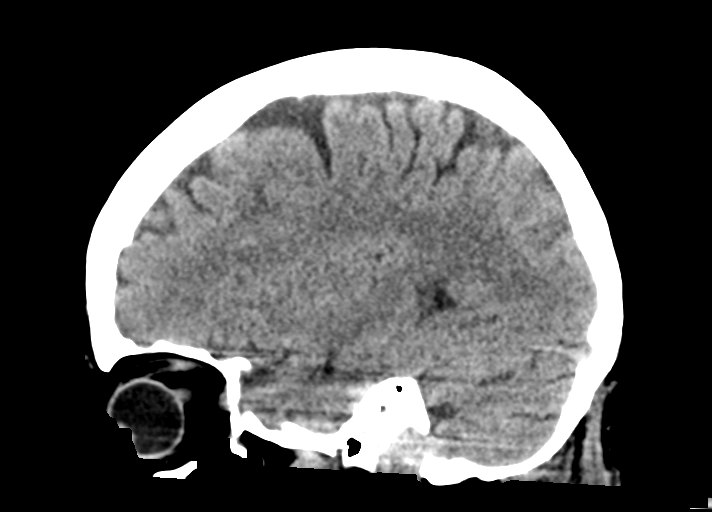
[im 27/53  brain]
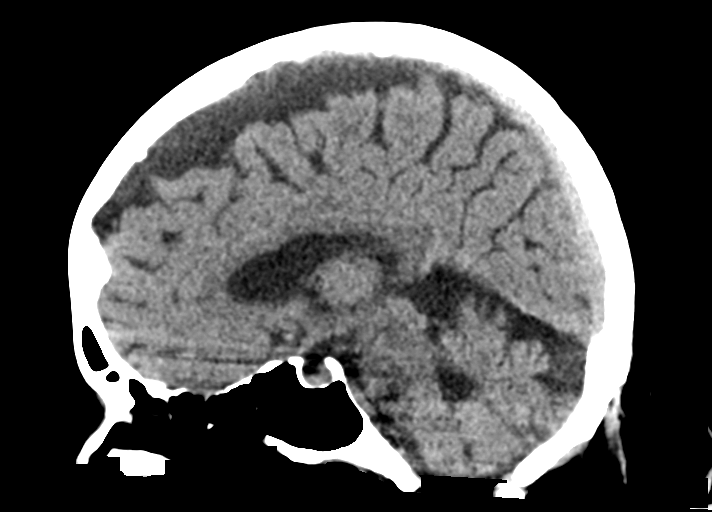
[im 35/53  brain]
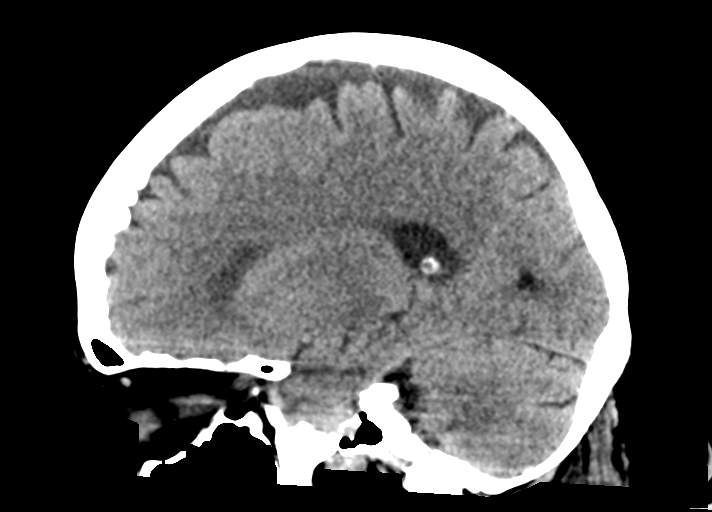

[16 of 47 positions shown; findings below may reference images not displayed]

FINDINGS: Brain: The brainstem, cerebellum, cerebral peduncles, thalami, basal
ganglia, basilar cisterns, and ventricular system appear within
normal limits. No intracranial hemorrhage, mass lesion, or acute
CVA.

Vascular: There is atherosclerotic calcification of the cavernous
carotid arteries bilaterally.

Skull: Unremarkable

Sinuses/Orbits: Unremarkable

Other: No supplemental non-categorized findings.
IMPRESSION: No significant intracranial findings are identified to explain the
patient's current symptoms.

## 2023-05-05 ENCOUNTER — Other Ambulatory Visit: Payer: Self-pay | Admitting: Internal Medicine

## 2023-05-05 ENCOUNTER — Ambulatory Visit: Payer: Federal, State, Local not specified - PPO | Admitting: Podiatry

## 2023-05-05 DIAGNOSIS — E1165 Type 2 diabetes mellitus with hyperglycemia: Secondary | ICD-10-CM

## 2023-05-12 ENCOUNTER — Ambulatory Visit: Payer: Federal, State, Local not specified - PPO | Admitting: Podiatry

## 2023-05-23 ENCOUNTER — Other Ambulatory Visit: Payer: Self-pay | Admitting: Internal Medicine

## 2023-05-26 ENCOUNTER — Encounter: Payer: Self-pay | Admitting: Podiatry

## 2023-05-26 ENCOUNTER — Ambulatory Visit (INDEPENDENT_AMBULATORY_CARE_PROVIDER_SITE_OTHER): Payer: Medicare Other | Admitting: Podiatry

## 2023-05-26 DIAGNOSIS — E11621 Type 2 diabetes mellitus with foot ulcer: Secondary | ICD-10-CM

## 2023-05-26 DIAGNOSIS — L97529 Non-pressure chronic ulcer of other part of left foot with unspecified severity: Secondary | ICD-10-CM

## 2023-05-27 ENCOUNTER — Telehealth: Payer: Self-pay | Admitting: Podiatry

## 2023-05-27 MED ORDER — AMOXICILLIN-POT CLAVULANATE 875-125 MG PO TABS: 1.0000 | ORAL_TABLET | Freq: Two times a day (BID) | ORAL | 0 refills | Status: DC

## 2023-05-27 NOTE — Telephone Encounter (Signed)
Rx has been sent  

## 2023-05-27 NOTE — Telephone Encounter (Signed)
pt stated that you were going to send in an antibiotic for her

## 2023-05-30 NOTE — Progress Notes (Signed)
  Subjective:  Patient ID: Betty [name], female    DOB: [birth year],  MRN: [id number]  Chief Complaint  Patient presents with   Wound Check    "I can't see it but he said it looks bad.  He said it has an odor.'    67 y.o. female presents with the above complaint. History confirmed with patient.  She returns for follow-up, was having difficulty getting to and from her appointments again.  Objective:  Physical Exam: Abnormal sensory exam with loss of protective sensation, she has palpable pulses, good capillary fill time.  Right hallux has healed, left hallux full-thickness ulcer measuring 10 x 12 mm x 3 mm surrounding hyperkeratosis no signs of infection mild serous drainage, she has thickened elongated toenails x 10.  Hallux shows some erythema there is no ascending cellulitis purulent drainage or malodor    Assessment:   1. Diabetic ulcer of left great toe [facility])      Plan:  Patient was evaluated and treated and all questions answered.  Ulcer left hallux -We discussed the etiology and factors that are a part of the wound healing process.  We also discussed the risk of infection both soft tissue and osteomyelitis from open ulceration.  Discussed the risk of limb loss if this happens or worsens. -Debridement as below. -Dressed with povidone ointment, DSD. -Continue home dressing changes daily with gentamicin ointment and 4 x 4 gauze and tape, I think the Coban likely was causing issues with it getting too tight. -Rx for Augmentin sent to pharmacy she did have some increasing erythema today, I will see her back in 2 weeks to reevaluate.  We discussed possibility of worsening signs or symptoms of worsening infection and she will proceed to the emergency room if this develops.   Procedure: Excisional Debridement of Wound Rationale: Removal of non-viable soft tissue from the wound to promote healing.  Anesthesia: none Post-Debridement Wound Measurements: Noted above Type of  Debridement: Sharp Excisional Tissue Removed: Non-viable soft tissue Depth of Debridement: subcutaneous tissue. Technique: Sharp excisional debridement to bleeding, viable wound base.  Dressing: Dry, sterile, compression dressing. Disposition: Patient tolerated procedure well.      Return in about 2 weeks (around 06/09/2023) for wound care.       Return in about 2 weeks (around 06/09/2023) for wound care.

## 2023-06-03 ENCOUNTER — Encounter: Payer: Self-pay | Admitting: Podiatry

## 2023-06-03 ENCOUNTER — Telehealth: Payer: Self-pay | Admitting: Podiatry

## 2023-06-03 NOTE — Telephone Encounter (Signed)
Patient called. She was in last Wednesday for a wound on her toe.  States you recommended Dial soap to clean it.  She doesn't have that soap, but her sister uses Vashe wound solution on her wounds and has some she can borrow.  She wants to know if you are okay with her using the Vashe solution, or want her to go buy some Dial soap?

## 2023-06-04 ENCOUNTER — Ambulatory Visit (INDEPENDENT_AMBULATORY_CARE_PROVIDER_SITE_OTHER): Payer: Federal, State, Local not specified - PPO | Admitting: Internal Medicine

## 2023-06-04 ENCOUNTER — Encounter: Payer: Self-pay | Admitting: Internal Medicine

## 2023-06-04 VITALS — BP 138/70 | HR 77 | Ht 67.0 in | Wt 265.2 lb

## 2023-06-04 DIAGNOSIS — E1159 Type 2 diabetes mellitus with other circulatory complications: Secondary | ICD-10-CM

## 2023-06-04 DIAGNOSIS — E1142 Type 2 diabetes mellitus with diabetic polyneuropathy: Secondary | ICD-10-CM

## 2023-06-04 DIAGNOSIS — Z794 Long term (current) use of insulin: Secondary | ICD-10-CM

## 2023-06-04 DIAGNOSIS — Z7985 Long-term (current) use of injectable non-insulin antidiabetic drugs: Secondary | ICD-10-CM

## 2023-06-04 DIAGNOSIS — E1165 Type 2 diabetes mellitus with hyperglycemia: Secondary | ICD-10-CM

## 2023-06-04 DIAGNOSIS — E119 Type 2 diabetes mellitus without complications: Secondary | ICD-10-CM | POA: Diagnosis not present

## 2023-06-04 DIAGNOSIS — E785 Hyperlipidemia, unspecified: Secondary | ICD-10-CM

## 2023-06-04 DIAGNOSIS — Z7984 Long term (current) use of oral hypoglycemic drugs: Secondary | ICD-10-CM

## 2023-06-04 LAB — POCT GLYCOSYLATED HEMOGLOBIN (HGB A1C): Hemoglobin A1C: 7.8 % — AB (ref 4.0–5.6)

## 2023-06-04 MED ORDER — INSULIN REGULAR HUMAN 100 UNIT/ML IJ SOLN
INTRAMUSCULAR | 3 refills | Status: DC
Start: 2023-06-04 — End: 2023-08-03

## 2023-06-04 MED ORDER — INSULIN NPH (HUMAN) (ISOPHANE) 100 UNIT/ML ~~LOC~~ SUSP
SUBCUTANEOUS | 3 refills | Status: DC
Start: 2023-06-04 — End: 2023-08-03

## 2023-06-04 NOTE — Patient Instructions (Addendum)
Please continue: - Metformin XR 500 mg 2x a day - Ozempic 1 mg weekly   Breakfast  Lunch   Dinner bedtime  Insulin N: 36 units -  - 36 units  Insulin R: 20-22 units  20-22 units  20-22 units    -  For sugars: 71-90: take 16 units of R insulin 50-70: do not take R insulin  Try to put back the Freestyle Libre 3 CGM.  Please return in 4 months.

## 2023-06-04 NOTE — Progress Notes (Signed)
Patient ID: Betty [name], female   DOB: August 10, 1956, 67 y.o.   MRN: [id number]  Betty [name] is a 67 y.o. female, returning for f/u for DM2, dx >10 years ago, insulin-dependent, uncontrolled, with complications (CAD - s/p stent, peripheral neuropathy). Last visit 8 months ago.   PCP in [place].  She is still  contemplating moving to [place] to be closer to family, but not quite now. Her son accompanies her today and offers part of the information.   Interim history: She continues to not sleep well and has significant fatigue.   She also has generalized aches and pains.  She has blurry vision >> awaiting cataract surgery. She recently had a diabetic foot ulcer and was seen by Dr. Lilian Kapur 10/19/2022.   Reviewed HbA1c levels: Lab Results  Component Value Date   HGBA1C 7.4 (A) 10/01/2022   HGBA1C 7.7 (A) 01/09/2022   HGBA1C 7.9 (A) 07/22/2021   HGBA1C 7.5 (A) 01/09/2021   HGBA1C 7.7 (A) 04/19/2020   HGBA1C 8.0 (A) 09/21/2019   HGBA1C 7.3 (A) 08/22/2018   HGBA1C 7.0 (A) 05/03/2018   HGBA1C 7.6 12/31/2017   HGBA1C 7.0 06/17/2017     Pt is on a regimen of: - Metformin XR 500 mg 3 >> 2x a day - Victoza 1.2 mg daily >> Ozempic 0.5 >> 1 mg weekly   Breakfast  Lunch   Dinner bedtime  Insulin N: 36 units -  - 36 units  Insulin R: 22 >> 20 units with a  smaller meal                 24 >> 22 units wiith a  regular meal 22 >> 20 units with a  smaller meal 24 >> 22 units wiith a  regular meal 22 >> 20 units with a  smaller meal 24 >> 22 units wiith a  regular meal   -  For sugars: 71-90: take 16 units of R insulin 50-70: do not take R insulin   We tried Bydureon 2 mg weekly (added 08/2015) >> not covered Was previously on 70/30 Insulin: 70-20-30 (split in 3 b/c she was dropping her sugars overnight). On these, she was still having lows in am (40s). She was on Novolog in the past but could not afford it.  She was also on Amaryl and Avandia. She has bladder dysfxn - cannot  try SGLT2 inh.  She had diarrhea with regular metformin.   Meter: Liv[place]   Pt checks her sugars with the libre 3 but now off, not checking 2/2 blurry vision. From before: - am: 94, 106-169, 192, 201 and 238 >> 94-144, 171, 194 >>131-270 >> 120, 152 - 2h after b'fast:  >> 145, 215 >> n/c >> 139-182, 219 >> 190 >> n/c >> 168 - before lunch:  110-140 >> 112-159, 202 >> 120-182, 213 >> 143-213>> 125, 181 - 2h after lunch: 109, 184 >> n/c >> 156 >> 140-189 >> n/c >> 92 (1.5 mo ago) - before dinner: 131, 178 >> 90-120s, 200 >> 168 >> n/c - bedtime: n/c >> 114, 160 >> 84, 122 >> n/c >> 140 >> n/c Lowest CBG: 60 >> ... 90s >> 6; she has hypoglycemia awareness in the 80s Highest: 205, she believes.   No CKD, last BUN/creatinine was:  Lab Results  Component Value Date   BUN 9 06/11/2021   BUN 14 01/09/2021   Lab Results  Component Value Date   CREATININE 0.54 06/11/2021   CREATININE 0.72 01/09/2021  On  ramipril.   + HL; Last set of lipids: Lab Results  Component Value Date   CHOL 102 01/09/2022   HDL 47 01/09/2022   LDLCALC 32 01/09/2022   LDLDIRECT 48 12/11/2019   TRIG 134 01/09/2022   CHOLHDL 2.2 01/09/2022  Prev. on Pravachol and she is not sure why she is not taking this anymore.  She was on Lipitor >> had mm cramps.   On fish oil.  Now also on Crestor 10.  She is also on Vascepa 2 g twice a day.   - eye exam: 2024: No DR reportedly, previously + DR + glaucoma, + cataracts (will have surgery). Dr Senaida Ores.   -+ Numbness and tingling in her feet.  She was previously on Lyrica but had to stop due to somnolence.  She also had somnolence with the 300 mg Neurontin tablets >> then 100 mg daily >> then stopped -now back on it.  She sees neurology (for  vertigo). Sees Dr Yetta Barre (podiatrist).  Last foot exam was 10/01/2022 here in clinic   She also has a history of HTN, OSA, obesity, OA - on Osteobiflex, vitamin D deficiency.   She developed severe nausea and vertigo on 04/10/2018.  She was on Reglan (per Dr. Russella Dar) that helped with the nausea >> now off >> she was out of work because of this for a period of time.  She contacted our office and  I advised her to stop Victoza but her vertigo did not improve so we restarted the dose up.  Of note, brain MRI was negative for a stroke. She had a stress test with Dr. Eden Emms as she had CP with exertion >> positive >> had a heart cath >> had a stent placed.  She was restarted on a statin afterwards.   ROS: + See HPI   I reviewed pt's medications, allergies, PMH, social hx, family hx, and changes were documented in the history of present illness. Otherwise, unchanged from my initial visit note.   Past Medical History:  Diagnosis Date   Arthritis    CAD, NATIVE VESSEL    a. S/p LAD stent in 2006 b. S/p DES to Lcx 08/2019 and patent LAD stent   DM (diabetes mellitus) (HCC)    Fibromyalgia    HYPERLIPIDEMIA-MIXED    HYPERTENSION, BENIGN    Neuropathy    Obesity    Osteoarthritis    Sleep apnea    Patient reported on 04/12/13   Vertigo    Past Surgical History:  Procedure Laterality Date   BREATH TEK H PYLORI N/A 03/08/2013   Procedure: BREATH TEK H PYLORI;  Surgeon: Atilano Ina, MD;  Location: [facility];  Service: General;  Laterality: N/A;   CESAREAN SECTION     CHOLECYSTECTOMY  1996   CORONARY ANGIOPLASTY WITH STENT PLACEMENT  11/2004   DES - LAD   CORONARY STENT INTERVENTION N/A 09/06/2019   Procedure: CORONARY STENT INTERVENTION;  Surgeon: Tonny Bollman, MD;  Location: [facility] INVASIVE CV LAB;  Service: Cardiovascular;  Laterality: N/A;   CORONARY STENT PLACEMENT  11/2004   RIGHT/LEFT HEART CATH AND CORONARY ANGIOGRAPHY N/A 09/06/2019   Procedure: RIGHT/LEFT HEART CATH AND CORONARY ANGIOGRAPHY;  Surgeon: Tonny Bollman, MD;  Location: [facility] INVASIVE CV LAB;  Service: Cardiovascular;  Laterality: N/A;   Social History   Socioeconomic History   Marital status: Divorced    Spouse name: Not on file   Number of children:  2   Years of education: 12   Highest education level:  High school graduate  Occupational History   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   Right-handed.   Caffeine use:  She will sometimes drink up to three 20-oz bottles of soda per day.  Drinks 0.5 bottles of a 5-hour energy drink daily.   Social Determinants of Health   Financial Resource Strain: Not on file  Food Insecurity: Not on file  Transportation Needs: Not on file  Physical Activity: Not on file  Stress: Not on file  Social Connections: Not on file  Intimate Partner Violence: Not on file   Current Outpatient Medications on File Prior to Visit  Medication Sig Dispense Refill   acetaminophen (TYLENOL) 650 MG CR tablet Take 650-1,300 mg by mouth every 8 (eight) hours as needed for pain.      amoxicillin-clavulanate (AUGMENTIN) 875-125 MG tablet Take 1 tablet by mouth 2 (two) times daily. 20 tablet 0   aspirin EC 81 MG tablet Take 81 mg by mouth daily.     beta carotene 13086 UNIT capsule Take 25,000 Units by mouth daily.       carvedilol (COREG) 3.125 MG tablet Take 1 tablet (3.125 mg total) by mouth 2 (two) times daily with a meal. 180 tablet 2   chlorpheniramine-HYDROcodone (TUSSIONEX) 10-8 MG/5ML Take by mouth.     Cholecalciferol (VITAMIN D3) 250 MCG (10000 UT) capsule Take 10,000 Units by mouth daily.     clopidogrel (PLAVIX) 75 MG tablet TAKE ONE TABLET BY MOUTH EVERY DAY 90 tablet 3   Continuous Blood Gluc Sensor (FREESTYLE LIBRE 3 SENSOR) MISC 1 each by Does not apply route every 14 (fourteen) days. 6 each 3   estradiol (ESTRACE) 0.1 MG/GM vaginal cream Place 0.25 Applicatorfuls vaginally 3  (three) times a week. 90 g 0   fluocinolone (SYNALAR) 0.01 % external solution SMARTSIG:sparingly Topical Twice Daily     gabapentin (NEURONTIN) 100 MG capsule TAKE 1 CAPSULE BY MOUTH TWICE DAILY 180 capsule 1   Homeopathic Products (THERAWORX RELIEF EX) Apply 1 application topically 3 (three) times daily as needed (pain.).     icosapent Ethyl (VASCEPA) 1 g capsule Take 2 capsules (2 g total) by mouth 2 (two) times daily. 360 capsule 3   insulin NPH Human (NOVOLIN N) 100 UNIT/ML injection INJECT 36 UNITS            SUBCUTANEOUSLY TWO TIMES A DAY 70 mL 1   insulin regular (NOVOLIN R) 100 units/mL injection INJECT UP TO 100 UNITS     SUBCUTANEOUSLY DAILY 90 mL 0   Insulin Syringe-Needle U-100 (BD VEO INSULIN SYRINGE U/F) 31G X 15/64" 1 ML MISC USE 4 TIMES DAILY AS DIRECTED 400 each 3   latanoprost (XALATAN) 0.005 % ophthalmic solution Apply to eye.     Magnesium 250 MG TABS Take 250 mg by mouth daily.      Magnesium Oxide -Mg Supplement ([employer] MAGNESIUM OXIDE) 250 MG TABS Take by mouth.     meloxicam (MOBIC) 7.5 MG tablet Take 7.5 mg by mouth daily.     Menthol, Topical Analgesic, (ZIMS MAX-FREEZE EX) Apply 1 application topically 4 (four) times daily as needed (pain).  metFORMIN (GLUCOPHAGE-XR) 500 MG 24 hr tablet TAKE 1 TABLET BY MOUTH 3 TIMES DAILY WITH MEALS 270 tablet 1   Misc Natural Products (OSTEO BI-FLEX JOINT SHIELD PO) Take 1 tablet by mouth daily.       mupirocin ointment (BACTROBAN) 2 % Apply 1 application topically 2 (two) times daily. 30 g 2   MYRBETRIQ 25 MG TB24 tablet Take 25 mg by mouth daily.     nitroGLYCERIN (NITROSTAT) 0.4 MG SL tablet PLACE 1 TABLET UNDER THE TONGUE EVERY 5 MINUTES AS NEEDED FOR CHEST PAIN UP TO 3 DOSES, IF SYMPTOMS PERSIST CALL 911 25 tablet 3   OZEMPIC, 1 MG/DOSE, 4 MG/3ML SOPN INJECT 1MG  SUBCUTANEOUSLY  ONCE A WEEK 9 mL 3   Pumpkin Seed-Soy Germ (AZO BLADDER CONTROL/GO-LESS PO) Take 1 each by mouth 2 (two) times daily.      ramipril (ALTACE) 10 MG  capsule TAKE 1 CAPSULE BY MOUTH DAILY 90 capsule 1   rosuvastatin (CRESTOR) 10 MG tablet Take 1 tablet (10 mg total) by mouth daily. 90 tablet 3   solifenacin (VESICARE) 5 MG tablet Take 5 mg by mouth daily.     ULTRATRAK PRO TEST test strip TEST BLOOD SUGAR 5 TIMES DAILY AS INSTRUCTED 450 each 1   No current facility-administered medications on file prior to visit.   Allergies  Allergen Reactions   Lyrica [Pregabalin] Other (See Comments)    sleepy   Family History  Problem Relation Age of Onset   Heart disease Mother    Cancer Mother        ovarian   Arthritis Mother    Diabetes Mother    Hypertension Mother    Hypertension Father    Stroke Father    Diabetes Sister    Cancer Other    Coronary artery disease Other    Diabetes Other    PE: BP 138/70   Pulse 77   Ht 5\' 7"  (1.702 m)   Wt 265 lb 3.2 oz (120.3 kg)   SpO2 98%   BMI 41.54 kg/m  Wt Readings from Last 3 Encounters:  06/04/23 265 lb 3.2 oz (120.3 kg)  02/09/23 266 lb (120.7 kg)  12/18/22 265 lb (120.2 kg)    Constitutional: overweight, in NAD Eyes: no exophthalmos ENT: no masses palpated in neck, no cervical lymphadenopathy Cardiovascular: RRR, No MRG Respiratory: CTA B Musculoskeletal: no deformities Skin: no rashes Neurological: no tremor with outstretched hands  ASSESSMENT: 1. DM2, insulin-dependent, uncontrolled, with complications - CAD - s/p stent 11/2004 - Dr Eden Emms - peripheral neuropathy - DR She told me that her previous endocrinologist filled out FMLA forms 2/2 uncontrolled DM2, but I could not fill this since no major highs or lows or other issues.    2. PN   3. HL   4.  Obesity class III   PLAN:  1. DM2 -Patient with longstanding, insulin-dependent, type 2 diabetes, on basal-bolus insulin regimen and also metformin and GLP-1 receptor agonist with improved control after switching from Victoza to Ozempic.  At last visit, HbA1c was better, at 7.4%.  She was not checking sugars  consistently and I recommended to do so.  I also recommended to start a freestyle libre CGM and demonstrated use.  We did not change the medication regimen. -At today's visit, unfortunately she does not have any blood sugar checks.  She had a freestyle libre CGM, which she was able to obtain but this came off and she was not checking sugars with any device for  now.  Also, she had to change her phone recently.  She is planning to restart the libre CGM as soon as possible.  I did advise her about coverage for the CGM to avoid coming off and discussed about the best place to attach it.  Otherwise, unfortunately, I cannot change her regimen for now due to lack of data.  HbA1c today is than before, but still under 8% (see below). - I refilled her insulins.  She had problems obtaining them from the mail-order pharmacy as she did not have enough vials sent. - I advised her to: Patient Instructions  Please continue: - Metformin XR 500 mg 2x a day - Ozempic 1 mg weekly   Breakfast  Lunch   Dinner bedtime  Insulin N: 36 units -  - 36 units  Insulin R: 20-22 units  20-22 units  20-22 units    -  For sugars: 71-90: take 16 units of R insulin 50-70: do not take R insulin  Try to get the [employer] CGM.  Please return in 4 months.   - we checked her HbA1c: 7.8% (higher) - advised to check sugars at different times of the day - 4x a day, rotating check times - advised for yearly eye exams >> she is UTD - will check annual labs today - return to clinic in 4 months   2. PN -stabvle -She previously had somnolence with a higher dose of Neurontin, 300 mg daily, currently on 100 mg twice a day -We did discuss about possible side effects of Neurontin and advised to stay with the minimum dose possible   3. HL -Latest lipid panel had all fractions at goal: Lab Results  Component Value Date   CHOL 102 01/09/2022   HDL 47 01/09/2022   LDLCALC 32 01/09/2022   LDLDIRECT 48 12/11/2019   TRIG 134  01/09/2022   CHOLHDL 2.2 01/09/2022  -She continues on Crestor 10 mg daily and Vascepa 2 g twice a day without side effects.  Of note, she had muscle aches with Lipitor.  She then tried pravastatin but stopped for an unknown reason. -Will recheck her lipid panel today   4.  Obesity class III -Will continue to do which should also help with weight loss -She gained 5 pounds before last visit -At today's visit, weight is lower by approximately 10 pounds.  Component     Latest Ref Rng 06/04/2023  Hemoglobin A1C     4.0 - 5.6 % 7.8 !   Glucose     65 - 99 mg/dL 086 (H)   BUN     7 - 25 mg/dL 14   Creatinine     5.78 - 1.05 mg/dL 4.69   BUN/Creatinine Ratio     6 - 22 (calc) SEE NOTE:   Sodium     135 - 146 mmol/L 139   Potassium     3.5 - 5.3 mmol/L 4.1   Chloride     98 - 110 mmol/L 104   CO2     20 - 32 mmol/L 25   Calcium     8.6 - 10.4 mg/dL 9.8   Cholesterol     <629 mg/dL 528   Triglycerides     <150 mg/dL 413   HDL Cholesterol     > OR = 50 mg/dL 46 (L)   Total CHOL/HDL Ratio     <5.0 (calc) 2.3   Total Protein     6.1 - 8.1 g/dL 7.1  Total Bilirubin     0.2 - 1.2 mg/dL 0.6   AST     10 - 35 U/L 14   ALT     6 - 29 U/L 16   Microalb, Ur     mg/dL 0.4   MICROALB/CREAT RATIO     <30 mg/g creat 6   Creatinine, Urine     20 - 275 mg/dL 71   LDL Cholesterol (Calc)     mg/dL (calc) 38   Non-HDL Cholesterol (Calc)     <130 mg/dL (calc) 59   Albumin MSPROF     3.6 - 5.1 g/dL 4.1   Globulin     1.9 - 3.7 g/dL (calc) 3.0   AG Ratio     1.0 - 2.5 (calc) 1.4   Alkaline phosphatase (APISO)     37 - 153 U/L 76   Labs are at goal with the exception of a slightly low HDL.   Carlus Pavlov, MD PhD [facility]

## 2023-06-04 NOTE — Addendum Note (Signed)
Addended by: Pollie Meyer on: 06/04/2023 04:19 PM   Modules accepted: Orders

## 2023-06-05 LAB — COMPREHENSIVE METABOLIC PANEL
AG Ratio: 1.4 (calc) (ref 1.0–2.5)
ALT: 16 U/L (ref 6–29)
AST: 14 U/L (ref 10–35)
Albumin: 4.1 g/dL (ref 3.6–5.1)
Alkaline phosphatase (APISO): 76 U/L (ref 37–153)
BUN: 14 mg/dL (ref 7–25)
CO2: 25 mmol/L (ref 20–32)
Calcium: 9.8 mg/dL (ref 8.6–10.4)
Chloride: 104 mmol/L (ref 98–110)
Creat: 0.66 mg/dL (ref 0.50–1.05)
Globulin: 3 g/dL (ref 1.9–3.7)
Glucose, Bld: 106 mg/dL — ABNORMAL HIGH (ref 65–99)
Potassium: 4.1 mmol/L (ref 3.5–5.3)
Sodium: 139 mmol/L (ref 135–146)
Total Bilirubin: 0.6 mg/dL (ref 0.2–1.2)
Total Protein: 7.1 g/dL (ref 6.1–8.1)

## 2023-06-05 LAB — LIPID PANEL
Cholesterol: 105 mg/dL
HDL: 46 mg/dL — ABNORMAL LOW
LDL Cholesterol (Calc): 38 mg/dL
Non-HDL Cholesterol (Calc): 59 mg/dL
Total CHOL/HDL Ratio: 2.3 (calc)
Triglycerides: 126 mg/dL

## 2023-06-05 LAB — MICROALBUMIN / CREATININE URINE RATIO
Creatinine, Urine: 71 mg/dL (ref 20–275)
Microalb Creat Ratio: 6 mg/g{creat} (ref <30)
Microalb, Ur: 0.4 mg/dL

## 2023-06-09 ENCOUNTER — Ambulatory Visit: Payer: Federal, State, Local not specified - PPO | Admitting: Podiatry

## 2023-06-22 ENCOUNTER — Telehealth: Payer: Self-pay | Admitting: Podiatry

## 2023-06-22 NOTE — Telephone Encounter (Signed)
Patient called to cancel her appointment for tomorrow. Patient wanted Dr Lilian Kapur to know that she has cataract and she canr drive right now until she has surgery. She will cal;l back to schedule when she has her surgery.

## 2023-06-23 ENCOUNTER — Ambulatory Visit: Payer: Federal, State, Local not specified - PPO | Admitting: Podiatry

## 2023-07-08 ENCOUNTER — Ambulatory Visit: Payer: Federal, State, Local not specified - PPO | Admitting: Podiatry

## 2023-07-14 ENCOUNTER — Ambulatory Visit: Payer: Federal, State, Local not specified - PPO | Admitting: Podiatry

## 2023-07-28 ENCOUNTER — Encounter: Payer: Self-pay | Admitting: Podiatry

## 2023-07-28 ENCOUNTER — Ambulatory Visit (INDEPENDENT_AMBULATORY_CARE_PROVIDER_SITE_OTHER): Payer: Medicare Other | Admitting: Podiatry

## 2023-07-28 VITALS — BP 167/72 | HR 75

## 2023-07-28 DIAGNOSIS — L97529 Non-pressure chronic ulcer of other part of left foot with unspecified severity: Secondary | ICD-10-CM

## 2023-07-28 DIAGNOSIS — M869 Osteomyelitis, unspecified: Secondary | ICD-10-CM

## 2023-07-28 DIAGNOSIS — E11621 Type 2 diabetes mellitus with foot ulcer: Secondary | ICD-10-CM | POA: Diagnosis not present

## 2023-07-28 DIAGNOSIS — E11628 Type 2 diabetes mellitus with other skin complications: Secondary | ICD-10-CM

## 2023-07-28 DIAGNOSIS — L089 Local infection of the skin and subcutaneous tissue, unspecified: Secondary | ICD-10-CM

## 2023-07-28 NOTE — Progress Notes (Signed)
  Subjective:  Patient ID: Betty [name], female    DOB: 08/05/1956,  MRN: [id number]  Chief Complaint  Patient presents with   Wound Check    "It don't look good to me and it stinks.  I'm out of the Silvadene cream 1%."    67 y.o. female presents with the above complaint. History confirmed with patient.  She returns for follow-up, she has had difficulty again getting to and from her appointments with me, she is trying to get her cataracts fixed so she can see better, has not been able to drive due to this  Objective:  Physical Exam: Abnormal sensory exam with loss of protective sensation, she has palpable pulses, good capillary fill time.  Right hallux has healed, left hallux full-thickness ulcer measuring 20 x 15 mm x 3 mm surrounding hyperkeratosis that is nearly doubled in size it penetrates deep to the level of the flexor tendon and joint, erythema to the MTPJ       Assessment:   1. Diabetic ulcer of left great toe (HCC)   2. Diabetic foot infection (HCC)   3. Osteomyelitis of great toe of left foot (HCC)      Plan:  Patient was evaluated and treated and all questions answered.  Ulcer left hallux -Unfortunately she has had a significant deterioration of her wound that has doubled in size and depth is increased and I suspect likely she has osteomyelitis and a diabetic foot infection developing as well as possible abscess or flexor tenosynovitis of an infectious etiology.  We discussed that continuing outpatient management which has been difficult for her due to social factors likely is to lead to further deterioration of the wound and greater limb loss.  I discussed this also with her son by phone, I recommended to them both that she proceed to the emergency room today or tomorrow for evaluation MRI ABI/TBI ultrasound and plan for surgery with [place] which may include amputation of the toe.  She was upset about this possible news of amputation but understands the need to stop  the spread of the infection.  We will follow as a consultation service after she is admitted.   No follow-ups on file.

## 2023-07-29 ENCOUNTER — Other Ambulatory Visit: Payer: Self-pay

## 2023-07-29 ENCOUNTER — Emergency Department: Payer: Medicare Other

## 2023-07-29 ENCOUNTER — Telehealth: Payer: Self-pay | Admitting: Podiatry

## 2023-07-29 ENCOUNTER — Inpatient Hospital Stay
Admission: EM | Admit: 2023-07-29 | Discharge: 2023-08-03 | DRG: 617 | Disposition: A | Discharge status: Home Health | Payer: Medicare Other | Attending: Internal Medicine | Admitting: Internal Medicine

## 2023-07-29 DIAGNOSIS — Z713 Dietary counseling and surveillance: Secondary | ICD-10-CM

## 2023-07-29 DIAGNOSIS — E785 Hyperlipidemia, unspecified: Secondary | ICD-10-CM | POA: Diagnosis present

## 2023-07-29 DIAGNOSIS — M86172 Other acute osteomyelitis, left ankle and foot: Secondary | ICD-10-CM | POA: Diagnosis present

## 2023-07-29 DIAGNOSIS — L97529 Non-pressure chronic ulcer of other part of left foot with unspecified severity: Secondary | ICD-10-CM | POA: Diagnosis not present

## 2023-07-29 DIAGNOSIS — G4733 Obstructive sleep apnea (adult) (pediatric): Secondary | ICD-10-CM | POA: Diagnosis present

## 2023-07-29 DIAGNOSIS — Z6841 Body Mass Index (BMI) 40.0 and over, adult: Secondary | ICD-10-CM | POA: Diagnosis not present

## 2023-07-29 DIAGNOSIS — M797 Fibromyalgia: Secondary | ICD-10-CM | POA: Diagnosis present

## 2023-07-29 DIAGNOSIS — Z7982 Long term (current) use of aspirin: Secondary | ICD-10-CM

## 2023-07-29 DIAGNOSIS — L97522 Non-pressure chronic ulcer of other part of left foot with fat layer exposed: Secondary | ICD-10-CM | POA: Diagnosis present

## 2023-07-29 DIAGNOSIS — E1140 Type 2 diabetes mellitus with diabetic neuropathy, unspecified: Secondary | ICD-10-CM | POA: Diagnosis present

## 2023-07-29 DIAGNOSIS — E1169 Type 2 diabetes mellitus with other specified complication: Principal | ICD-10-CM | POA: Diagnosis present

## 2023-07-29 DIAGNOSIS — E1165 Type 2 diabetes mellitus with hyperglycemia: Secondary | ICD-10-CM | POA: Diagnosis present

## 2023-07-29 DIAGNOSIS — Z8261 Family history of arthritis: Secondary | ICD-10-CM

## 2023-07-29 DIAGNOSIS — I10 Essential (primary) hypertension: Secondary | ICD-10-CM

## 2023-07-29 DIAGNOSIS — E1159 Type 2 diabetes mellitus with other circulatory complications: Secondary | ICD-10-CM

## 2023-07-29 DIAGNOSIS — E1142 Type 2 diabetes mellitus with diabetic polyneuropathy: Secondary | ICD-10-CM | POA: Diagnosis present

## 2023-07-29 DIAGNOSIS — Z9049 Acquired absence of other specified parts of digestive tract: Secondary | ICD-10-CM | POA: Diagnosis not present

## 2023-07-29 DIAGNOSIS — Z8249 Family history of ischemic heart disease and other diseases of the circulatory system: Secondary | ICD-10-CM

## 2023-07-29 DIAGNOSIS — Z955 Presence of coronary angioplasty implant and graft: Secondary | ICD-10-CM

## 2023-07-29 DIAGNOSIS — M170 Bilateral primary osteoarthritis of knee: Secondary | ICD-10-CM | POA: Diagnosis present

## 2023-07-29 DIAGNOSIS — E11649 Type 2 diabetes mellitus with hypoglycemia without coma: Secondary | ICD-10-CM | POA: Diagnosis not present

## 2023-07-29 DIAGNOSIS — Z7902 Long term (current) use of antithrombotics/antiplatelets: Secondary | ICD-10-CM

## 2023-07-29 DIAGNOSIS — M869 Osteomyelitis, unspecified: Secondary | ICD-10-CM | POA: Diagnosis present

## 2023-07-29 DIAGNOSIS — Z79899 Other long term (current) drug therapy: Secondary | ICD-10-CM

## 2023-07-29 DIAGNOSIS — E11621 Type 2 diabetes mellitus with foot ulcer: Secondary | ICD-10-CM | POA: Diagnosis present

## 2023-07-29 DIAGNOSIS — E1151 Type 2 diabetes mellitus with diabetic peripheral angiopathy without gangrene: Secondary | ICD-10-CM | POA: Diagnosis present

## 2023-07-29 DIAGNOSIS — E11628 Type 2 diabetes mellitus with other skin complications: Secondary | ICD-10-CM | POA: Diagnosis present

## 2023-07-29 DIAGNOSIS — L03032 Cellulitis of left toe: Secondary | ICD-10-CM | POA: Diagnosis present

## 2023-07-29 DIAGNOSIS — Z794 Long term (current) use of insulin: Secondary | ICD-10-CM

## 2023-07-29 DIAGNOSIS — Z7984 Long term (current) use of oral hypoglycemic drugs: Secondary | ICD-10-CM

## 2023-07-29 DIAGNOSIS — Z792 Long term (current) use of antibiotics: Secondary | ICD-10-CM

## 2023-07-29 DIAGNOSIS — Z888 Allergy status to other drugs, medicaments and biological substances status: Secondary | ICD-10-CM | POA: Diagnosis not present

## 2023-07-29 DIAGNOSIS — I2510 Atherosclerotic heart disease of native coronary artery without angina pectoris: Secondary | ICD-10-CM | POA: Diagnosis present

## 2023-07-29 DIAGNOSIS — Z833 Family history of diabetes mellitus: Secondary | ICD-10-CM

## 2023-07-29 DIAGNOSIS — Z79890 Hormone replacement therapy (postmenopausal): Secondary | ICD-10-CM

## 2023-07-29 DIAGNOSIS — I70262 Atherosclerosis of native arteries of extremities with gangrene, left leg: Secondary | ICD-10-CM | POA: Diagnosis not present

## 2023-07-29 DIAGNOSIS — Z7985 Long-term (current) use of injectable non-insulin antidiabetic drugs: Secondary | ICD-10-CM

## 2023-07-29 DIAGNOSIS — E162 Hypoglycemia, unspecified: Secondary | ICD-10-CM

## 2023-07-29 HISTORY — DX: Hypoglycemia, unspecified: E16.2

## 2023-07-29 LAB — SEDIMENTATION RATE: Sed Rate: 65 mm/h — ABNORMAL HIGH (ref 0–30)

## 2023-07-29 LAB — COMPREHENSIVE METABOLIC PANEL
ALT: 20 U/L (ref 0–44)
AST: 20 U/L (ref 15–41)
Albumin: 3.6 g/dL (ref 3.5–5.0)
Alkaline Phosphatase: 87 U/L (ref 38–126)
Anion gap: 9 (ref 5–15)
BUN: 17 mg/dL (ref 8–23)
CO2: 23 mmol/L (ref 22–32)
Calcium: 9.1 mg/dL (ref 8.9–10.3)
Chloride: 104 mmol/L (ref 98–111)
Creatinine, Ser: 0.69 mg/dL (ref 0.44–1.00)
GFR, Estimated: >60 mL/min (ref >60)
Glucose, Bld: 71 mg/dL (ref 70–99)
Potassium: 3.8 mmol/L (ref 3.5–5.1)
Sodium: 136 mmol/L (ref 135–145)
Total Bilirubin: 0.8 mg/dL (ref 0.3–1.2)
Total Protein: 7.5 g/dL (ref 6.5–8.1)

## 2023-07-29 LAB — CBC WITH DIFFERENTIAL/PLATELET
Abs Immature Granulocytes: 0.06 10*3/uL (ref 0.00–0.07)
Basophils Absolute: 0.1 10*3/uL (ref 0.0–0.1)
Basophils Relative: 0 %
Eosinophils Absolute: 0.4 10*3/uL (ref 0.0–0.5)
Eosinophils Relative: 3 %
HCT: 38.2 % (ref 36.0–46.0)
Hemoglobin: 12.3 g/dL (ref 12.0–15.0)
Immature Granulocytes: 0 %
Lymphocytes Relative: 33 %
Lymphs Abs: 4.7 10*3/uL — ABNORMAL HIGH (ref 0.7–4.0)
MCH: 29.2 pg (ref 26.0–34.0)
MCHC: 32.2 g/dL (ref 30.0–36.0)
MCV: 90.7 fL (ref 80.0–100.0)
Monocytes Absolute: 1.1 10*3/uL — ABNORMAL HIGH (ref 0.1–1.0)
Monocytes Relative: 8 %
Neutro Abs: 8 10*3/uL — ABNORMAL HIGH (ref 1.7–7.7)
Neutrophils Relative %: 56 %
Platelets: 343 10*3/uL (ref 150–400)
RBC: 4.21 MIL/uL (ref 3.87–5.11)
RDW: 12.7 % (ref 11.5–15.5)
Smear Review: NORMAL
WBC: 14.3 10*3/uL — ABNORMAL HIGH (ref 4.0–10.5)
nRBC: 0 % (ref 0.0–0.2)

## 2023-07-29 LAB — GLUCOSE, CAPILLARY: Glucose-Capillary: 162 mg/dL — ABNORMAL HIGH (ref 70–99)

## 2023-07-29 LAB — CBG MONITORING, ED
Glucose-Capillary: 44 mg/dL — CL (ref 70–99)
Glucose-Capillary: 53 mg/dL — ABNORMAL LOW (ref 70–99)
Glucose-Capillary: 59 mg/dL — ABNORMAL LOW (ref 70–99)
Glucose-Capillary: 167 mg/dL — ABNORMAL HIGH (ref 70–99)

## 2023-07-29 LAB — LACTIC ACID, PLASMA: Lactic Acid, Venous: 1 mmol/L (ref 0.5–1.9)

## 2023-07-29 MED ORDER — VANCOMYCIN HCL 2000 MG/400ML IV SOLN
2000.0000 mg | Freq: Once | INTRAVENOUS | Status: AC
  Administered 2023-07-30: 2000 mg via INTRAVENOUS
  Filled 2023-07-29 (×2): qty 400

## 2023-07-29 MED ORDER — DEXTROSE 50 % IV SOLN: 50.0000 mL | INTRAVENOUS | Status: DC | PRN

## 2023-07-29 MED ORDER — ONDANSETRON HCL 4 MG/2ML IJ SOLN: 4.0000 mg | Freq: Three times a day (TID) | INTRAMUSCULAR | Status: DC | PRN

## 2023-07-29 MED ORDER — DEXTROSE 10 % IV BOLUS
250.0000 mL | Freq: Once | INTRAVENOUS | Status: AC
  Administered 2023-07-29: 250 mL via INTRAVENOUS

## 2023-07-29 MED ORDER — ACETAMINOPHEN 325 MG PO TABS
650.0000 mg | ORAL_TABLET | Freq: Four times a day (QID) | ORAL | Status: DC | PRN
  Administered 2023-08-01 – 2023-08-03 (×4): 650 mg via ORAL
  Filled 2023-07-29 (×4): qty 2

## 2023-07-29 MED ORDER — HEPARIN SODIUM (PORCINE) 5000 UNIT/ML IJ SOLN
5000.0000 [IU] | Freq: Three times a day (TID) | INTRAMUSCULAR | Status: DC
  Administered 2023-07-30 – 2023-08-03 (×12): 5000 [IU] via SUBCUTANEOUS
  Filled 2023-07-29 (×12): qty 1

## 2023-07-29 MED ORDER — HYDRALAZINE HCL 20 MG/ML IJ SOLN: 5.0000 mg | INTRAMUSCULAR | Status: DC | PRN

## 2023-07-29 MED ORDER — METRONIDAZOLE 500 MG/100ML IV SOLN
500.0000 mg | Freq: Two times a day (BID) | INTRAVENOUS | Status: DC
  Administered 2023-07-30 – 2023-08-03 (×9): 500 mg via INTRAVENOUS
  Filled 2023-07-29 (×11): qty 100

## 2023-07-29 MED ORDER — SODIUM CHLORIDE 0.9 % IV SOLN
2.0000 g | INTRAVENOUS | Status: DC
  Administered 2023-07-30 – 2023-08-02 (×5): 2 g via INTRAVENOUS
  Filled 2023-07-29 (×6): qty 20

## 2023-07-29 MED ORDER — SODIUM CHLORIDE 0.9 % IV SOLN: 2.0000 g | Freq: Once | INTRAVENOUS | Status: DC

## 2023-07-29 NOTE — ED Notes (Signed)
Bg no longer low. MD notified.

## 2023-07-29 NOTE — ED Notes (Addendum)
MD notified of repeat. Additional po carbs given. Pt still asymptomatic. Per MD plan for D10 infusion.

## 2023-07-29 NOTE — ED Provider Notes (Signed)
[facility] Provider Note    Event Date/Time   First MD Initiated Contact with Patient 07/29/23 1914     (approximate)   History   Wound Check   HPI  Betty [name] is a 67 y.o. female past medical history significant for insulin-dependent diabetes, who presents to the emergency department with concern for an infection of her left great toe.  Patient was sent from her podiatrist office today for admission and antibiotics.  States that she has been having an ulcer to her left great toe and not on antibiotics recently.  States that she had a follow-up appointment today with podiatry and was sent to the emergency department because they were concern for possible osteomyelitis and that she may need an amputation.  Denies any fever or chills.  Did use her insulin earlier today while in the waiting room and states she has not been able to tolerate or eat much since that time.  Denies any fever or chills.     Physical Exam   Triage Vital Signs: ED Triage Vitals [07/29/23 1610]  Encounter Vitals Group     BP (!) 153/56     Systolic BP Percentile      Diastolic BP Percentile      Pulse Rate 79     Resp 18     Temp 97.6 F (36.4 C)     Temp Source Oral     SpO2 99 %     Weight      Height      Head Circumference      Peak Flow      Pain Score      Pain Loc      Pain Education      Exclude from Growth Chart     Most recent vital signs: Vitals:   07/29/23 1610  BP: (!) 153/56  Pulse: 79  Resp: 18  Temp: 97.6 F (36.4 C)  SpO2: 99%    Physical Exam Constitutional:      Appearance: She is well-developed.  HENT:     Head: Atraumatic.  Eyes:     Conjunctiva/sclera: Conjunctivae normal.  Cardiovascular:     Rate and Rhythm: Regular rhythm.  Pulmonary:     Effort: No respiratory distress.  Abdominal:     General: There is no distension.  Musculoskeletal:        General: Normal range of motion.     Cervical back: Normal range of  motion.     Comments: Left great toe ulceration that does not probe to bone.  Mild surrounding erythema and warmth.  No red streaking or crepitance.  Skin:    General: Skin is warm.  Neurological:     Mental Status: She is alert. Mental status is at baseline.     IMPRESSION / MDM / ASSESSMENT AND PLAN / ED COURSE  I reviewed the triage vital signs and the nursing notes.  Differential diagnosis including diabetic foot wound, osteomyelitis, cellulitis   No tachycardic or bradycardic dysrhythmias while on cardiac telemetry.  RADIOLOGY I independently reviewed imaging, my interpretation of imaging: Chest x-ray with no signs of pneumonia.  X-ray of the toe with ulceration but no obvious osteomyelitis.  LABS (all labs ordered are listed, but only abnormal results are displayed) Labs interpreted as -    Labs Reviewed  CBC WITH DIFFERENTIAL/PLATELET - Abnormal; Notable for the following components:      Result Value   WBC 14.3 (*)  Neutro Abs 8.0 (*)    Lymphs Abs 4.7 (*)    Monocytes Absolute 1.1 (*)    All other components within normal limits  CBG MONITORING, ED - Abnormal; Notable for the following components:   Glucose-Capillary 53 (*)    All other components within normal limits  CBG MONITORING, ED - Abnormal; Notable for the following components:   Glucose-Capillary 44 (*)    All other components within normal limits  CBG MONITORING, ED - Abnormal; Notable for the following components:   Glucose-Capillary 59 (*)    All other components within normal limits  CBG MONITORING, ED - Abnormal; Notable for the following components:   Glucose-Capillary 167 (*)    All other components within normal limits  CULTURE, BLOOD (ROUTINE X 2)  CULTURE, BLOOD (ROUTINE X 2)  LACTIC ACID, PLASMA  COMPREHENSIVE METABOLIC PANEL  PROTIME-INR  APTT  HIV ANTIBODY (ROUTINE TESTING W REFLEX)  BASIC METABOLIC PANEL  CBC  C-REACTIVE PROTEIN  SEDIMENTATION RATE     MDM  Patient found  to have significant hypoglycemia.  Given juice and crackers, ate a sandwich and continued to have ongoing hypoglycemia with a glucose in the 40s.  Given D10 infusion.  Given a meal tray.  Improvement of her glucose following D10 infusion and more food.  Likely secondary to taking her insulin and not eating well being in the emergency department.  Consulted and discussed the patient's case with Dr. Allena Katz with podiatry, recommended antibiotics and admission for MRI.  MRI ordered.  Started on antibiotics.  Does not meet sepsis criteria -admitted for diabetic foot wound.  Consulted hospitalist for admission.    PROCEDURES:  Critical Care performed: No  .Critical Care  Performed by: Corena Herter, MD Authorized by: Corena Herter, MD   Critical care provider statement:    Critical care time (minutes):  45   Critical care was necessary to treat or prevent imminent or life-threatening deterioration of the following conditions:  Endocrine crisis   Critical care was time spent personally by me on the following activities:  Development of treatment plan with patient or surrogate, discussions with consultants, evaluation of patient's response to treatment, examination of patient, ordering and review of laboratory studies, ordering and review of radiographic studies, ordering and performing treatments and interventions, pulse oximetry, re-evaluation of patient's condition and review of old charts   Patient's presentation is most consistent with acute presentation with potential threat to life or bodily function.   MEDICATIONS ORDERED IN ED: Medications  vancomycin (VANCOREADY) IVPB 2000 mg/400 mL (has no administration in time range)  acetaminophen (TYLENOL) tablet 650 mg (has no administration in time range)  ondansetron (ZOFRAN) injection 4 mg (has no administration in time range)  hydrALAZINE (APRESOLINE) injection 5 mg (has no administration in time range)  cefTRIAXone (ROCEPHIN) 2 g in sodium  chloride 0.9 % 100 mL IVPB (has no administration in time range)  metroNIDAZOLE (FLAGYL) IVPB 500 mg (has no administration in time range)  dextrose 50 % solution 50 mL (has no administration in time range)  heparin injection 5,000 Units (has no administration in time range)  dextrose (D10W) 10% bolus 250 mL (250 mLs Intravenous New Bag/Given 07/29/23 2140)    FINAL CLINICAL IMPRESSION(S) / ED DIAGNOSES   Final diagnoses:  Diabetic ulcer of toe of left foot associated with type 2 diabetes mellitus, with muscle involvement without evidence of necrosis (HCC)  Hypoglycemia     Rx / DC Orders   ED Discharge Orders  None        Note:  This document was prepared using Dragon voice recognition software and may include unintentional dictation errors.   Corena Herter, MD 07/29/23 2241

## 2023-07-29 NOTE — ED Triage Notes (Signed)
Pt arrives via [facility] from home for a wound check on her L greater toe. Pt has known diabetic ulcer and had debridement done at wound clinic yesterday with advice to go to ED for potential need for amputation. Pt denies recent fevers, illness. Not currently on abx.

## 2023-07-29 NOTE — H&P (Signed)
History and Physical    Betty [name]:[id number] DOB: 03/30/1956 DOA: 07/29/2023  Referring MD/NP/PA:   PCP: Ailene Ravel, MD   Patient coming from:  The patient is coming from home.     Chief Complaint: left great toe ulcer  HPI: [name] is a 67 y.o. female with medical history significant of DM, HTN, HLD, CAD with DES, morbid obesity, OSA not on CPAP, vertigo, peripheral neuropathy who presents with left great toe ulcer.  Patient states that she has chronic left great toe ulcer for more than 10 months.  She has been following up with Dr. Lilian Kapur of podiatry. Pt had debridement done at wound clinic yesterday. Per Dr. Vara Guardian clinic note, patient has worsening foot ulcer with possible osteomyelitis. Dr. Lilian Kapur recommended patient to come to the hospital to get MIR and ABI.  Patient has chills, no fever.  She does not have foot pain due to peripheral neuropathy.  Denies chest pain, cough, SOB.  She states she had diarrhea few days ago which has resolved.  Currently no nausea, vomiting, diarrhea or abdominal pain.  No symptoms of UTI.  Data reviewed independently and ED Course: pt was found to have WBC 14.3, GFR> 60, hyoglycemia with blood sugar 44-59 in ED. temperature normal, blood pressure 153/56, heart rate 79, RR 18, oxygen saturation 99% on room air.  X-ray of left great toe is negative for osteomyelitis.  Patient is admitted to MedSurg bed as inpatient.  Dr. Allena Katz of podiatry is consulted by EDP.   EKG:  Not done in ED, will get one.      Review of Systems:   General: no fevers, has chills, no body weight gain, fatigue HEENT: no blurry vision, hearing changes or sore throat Respiratory: no dyspnea, coughing, wheezing CV: no chest pain, no palpitations GI: no nausea, vomiting, abdominal pain, diarrhea, constipation GU: no dysuria, burning on urination, increased urinary frequency, hematuria  Ext: no leg edema Neuro: no unilateral weakness,  numbness, or tingling, no vision change or hearing loss Skin: has left great toe ulcer MSK: No muscle spasm, no deformity, no limitation of range of movement in spin Heme: No easy bruising.  Travel history: No recent long distant travel.   Allergy:  Allergies  Allergen Reactions   Lyrica [Pregabalin] Other (See Comments)    sleepy    Past Medical History:  Diagnosis Date   Arthritis    CAD, NATIVE VESSEL    a. S/p LAD stent in 2006 b. S/p DES to Lcx 08/2019 and patent LAD stent   DM (diabetes mellitus) (HCC)    Fibromyalgia    HYPERLIPIDEMIA-MIXED    HYPERTENSION, BENIGN    Neuropathy    Obesity    Osteoarthritis    Sleep apnea    Patient reported on 04/12/13   Vertigo     Past Surgical History:  Procedure Laterality Date   BREATH TEK H PYLORI N/A 03/08/2013   Procedure: BREATH TEK H PYLORI;  Surgeon: Atilano Ina, MD;  Location: [facility];  Service: General;  Laterality: N/A;   CESAREAN SECTION     CHOLECYSTECTOMY  1996   CORONARY ANGIOPLASTY WITH STENT PLACEMENT  11/2004   DES - LAD   CORONARY STENT INTERVENTION N/A 09/06/2019   Procedure: CORONARY STENT INTERVENTION;  Surgeon: Tonny Bollman, MD;  Location: [facility] INVASIVE CV LAB;  Service: Cardiovascular;  Laterality: N/A;   CORONARY STENT PLACEMENT  11/2004   RIGHT/LEFT HEART CATH AND CORONARY ANGIOGRAPHY N/A 09/06/2019   Procedure:  RIGHT/LEFT HEART CATH AND CORONARY ANGIOGRAPHY;  Surgeon: Tonny Bollman, MD;  Location: [facility] INVASIVE CV LAB;  Service: Cardiovascular;  Laterality: N/A;    Social History:  reports that she has never smoked. She has never used smokeless tobacco. She reports that she does not drink alcohol and does not use drugs.  Family History:  Family History  Problem Relation Age of Onset   Heart disease Mother    Cancer Mother        ovarian   Arthritis Mother    Diabetes Mother    Hypertension Mother    Hypertension Father    Stroke Father    Diabetes Sister    Cancer Other    Coronary  artery disease Other    Diabetes Other      Prior to Admission medications   Medication Sig Start Date End Date Taking? Authorizing Provider  acetaminophen (TYLENOL) 650 MG CR tablet Take 650-1,300 mg by mouth every 8 (eight) hours as needed for pain.     [provider]  amoxicillin-clavulanate (AUGMENTIN) 875-125 MG tablet Take 1 tablet by mouth 2 (two) times daily. 05/27/23   Edwin Cap, DPM  aspirin EC 81 MG tablet Take 81 mg by mouth daily.    [provider]  beta carotene 16109 UNIT capsule Take 25,000 Units by mouth daily.      [provider]  carvedilol (COREG) 3.125 MG tablet Take 1 tablet (3.125 mg total) by mouth 2 (two) times daily with a meal. 06/01/22   Wendall Stade, MD  chlorpheniramine-HYDROcodone (TUSSIONEX) 10-8 MG/5ML Take by mouth. 10/11/13   [provider]  Cholecalciferol (VITAMIN D3) 250 MCG (10000 UT) capsule Take 10,000 Units by mouth daily.    [provider]  clopidogrel (PLAVIX) 75 MG tablet TAKE ONE TABLET BY MOUTH EVERY DAY 03/22/23   Wendall Stade, MD  Continuous Blood Gluc Sensor (FREESTYLE LIBRE 3 SENSOR) MISC 1 each by Does not apply route every 14 (fourteen) days. 10/01/22   Carlus Pavlov, MD  estradiol (ESTRACE) 0.1 MG/GM vaginal cream Place 0.25 Applicatorfuls vaginally 3 (three) times a week. 02/10/23 02/10/24  Linzie Collin, MD  fluocinolone (SYNALAR) 0.01 % external solution SMARTSIG:sparingly Topical Twice Daily 07/23/22   [provider]  gabapentin (NEURONTIN) 100 MG capsule TAKE 1 CAPSULE BY MOUTH TWICE DAILY 01/04/23   Carlus Pavlov, MD  Homeopathic Products [facility] RELIEF EX) Apply 1 application topically 3 (three) times daily as needed (pain.).    [provider]  icosapent Ethyl (VASCEPA) 1 g capsule Take 2 capsules (2 g total) by mouth 2 (two) times daily. 04/13/22   Wendall Stade, MD  insulin NPH Human (NOVOLIN N) 100 UNIT/ML injection INJECT 36 UNITS  SUBCUTANEOUSLY TWO TIMES A DAY 06/04/23   Carlus Pavlov, MD  insulin regular (NOVOLIN R) 100 units/mL injection INJECT UP TO 100 UNITS     SUBCUTANEOUSLY DAILY 06/04/23   Carlus Pavlov, MD  Insulin Syringe-Needle U-100 (BD VEO INSULIN SYRINGE U/F) 31G X 15/64" 1 ML MISC USE 4 TIMES DAILY AS DIRECTED 03/01/23   Carlus Pavlov, MD  latanoprost (XALATAN) 0.005 % ophthalmic solution Apply to eye. 09/14/22   [provider]  Magnesium 250 MG TABS Take 250 mg by mouth daily.     [provider]  Magnesium Oxide -Mg Supplement ([employer] MAGNESIUM OXIDE) 250 MG TABS Take by mouth.    [provider]  meloxicam (MOBIC) 7.5 MG tablet Take 7.5 mg by mouth daily. 09/18/19  [provider]  Menthol, Topical Analgesic, (ZIMS MAX-FREEZE EX) Apply 1 application topically 4 (four) times daily as needed (pain).    [provider]  metFORMIN (GLUCOPHAGE-XR) 500 MG 24 hr tablet TAKE 1 TABLET BY MOUTH 3 TIMES DAILY WITH MEALS Patient taking differently: 2 (two) times daily with a meal. TAKE 1 TABLET BY MOUTH 3 TIMES DAILY WITH MEALS 03/05/23   Carlus Pavlov, MD  Misc Natural Products (OSTEO BI-FLEX JOINT SHIELD PO) Take 1 tablet by mouth daily.      [provider]  mupirocin ointment (BACTROBAN) 2 % Apply 1 application topically 2 (two) times daily. 10/02/20   McDonald, Adam R, DPM  MYRBETRIQ 25 MG TB24 tablet Take 25 mg by mouth daily.    [provider]  nitroGLYCERIN (NITROSTAT) 0.4 MG SL tablet PLACE 1 TABLET UNDER THE TONGUE EVERY 5 MINUTES AS NEEDED FOR CHEST PAIN UP TO 3 DOSES, IF SYMPTOMS PERSIST CALL 911 12/05/21   Wendall Stade, MD  OZEMPIC, 1 MG/DOSE, 4 MG/3ML SOPN INJECT 1MG  SUBCUTANEOUSLY  ONCE A WEEK 05/24/23   Carlus Pavlov, MD  Pumpkin Seed-Soy Germ (AZO BLADDER CONTROL/GO-LESS PO) Take 1 each by mouth 2 (two) times daily.     [provider]  ramipril (ALTACE) 10 MG capsule TAKE 1 CAPSULE BY MOUTH DAILY 01/26/23   Carlus Pavlov, MD  rosuvastatin (CRESTOR) 10 MG tablet Take 1 tablet (10 mg total) by mouth daily. 12/31/22   Carlus Pavlov, MD  solifenacin (VESICARE) 5 MG tablet Take 5 mg by mouth daily. 12/05/21   [provider]  Sherryl Barters PRO TEST test strip TEST BLOOD SUGAR 5 TIMES DAILY AS INSTRUCTED 10/10/18   Carlus Pavlov, MD    Physical Exam: Vitals:   07/29/23 2318 07/29/23 2341 07/29/23 2347 07/29/23 2358  BP: (!) 141/49 (!) 156/57  (!) 156/57  Pulse: 73 74  74  Resp: 18 18  18   Temp: (!) 97.4 F (36.3 C) 97.8 F (36.6 C)  97.8 F (36.6 C)  TempSrc: Oral Oral  Oral  SpO2: 98% 100%  100%  Weight:   118.7 kg 118.7 kg  Height:    5\' 7"  (1.702 m)   General: Not in acute distress HEENT:       Eyes: PERRL, EOMI, no jaundice       ENT: No discharge from the ears and nose, no pharynx injection, no tonsillar enlargement.        Neck: No JVD, no bruit, no mass felt. Heme: No neck lymph node enlargement. Cardiac: S1/S2, RRR, No murmurs, No gallops or rubs. Respiratory: No rales, wheezing, rhonchi or rubs. GI: Soft, nondistended, nontender, no rebound pain, no organomegaly, BS present. GU: No hematuria Ext: No pitting leg edema bilaterally. 1+DP/PT pulse bilaterally. Musculoskeletal: No joint deformities, No joint redness or warmth, no limitation of ROM in spin. Skin: has a deep ulcer in left great tow with surrounding erythema and warmth     Neuro: Alert, oriented X3, cranial nerves II-XII grossly intact, moves all extremities normally. Psych: Patient is not psychotic, no suicidal or hemocidal ideation.  Labs on Admission: I have personally reviewed following labs and imaging studies  CBC: Recent Labs  Lab 07/29/23 1613 07/30/23 0037  WBC 14.3* 10.8*  NEUTROABS 8.0*  --   HGB 12.3 11.5*  HCT 38.2 35.4*  MCV 90.7 90.1  PLT 343 305   Basic Metabolic Panel: Recent Labs  Lab 07/29/23 1613 07/30/23 0037  NA 136 135  K 3.8 4.1  CL 104 102  CO2 23 23  GLUCOSE 71  174*  BUN 17 16  CREATININE 0.69 0.73  CALCIUM 9.1 8.9   GFR: Estimated Creatinine Clearance: 89.7 mL/min (by C-G formula based on SCr of 0.73 mg/dL). Liver Function Tests: Recent Labs  Lab 07/29/23 1613  AST 20  ALT 20  ALKPHOS 87  BILITOT 0.8  PROT 7.5  ALBUMIN 3.6   No results for input(s): "LIPASE", "AMYLASE" in the last 168 hours. No results for input(s): "AMMONIA" in the last 168 hours. Coagulation Profile: Recent Labs  Lab 07/30/23 0037  INR 1.1   Cardiac Enzymes: No results for input(s): "CKTOTAL", "CKMB", "CKMBINDEX", "TROPONINI" in the last 168 hours. BNP (last 3 results) No results for input(s): "PROBNP" in the last 8760 hours. HbA1C: No results for input(s): "HGBA1C" in the last 72 hours. CBG: Recent Labs  Lab 07/29/23 2032 07/29/23 2057 07/29/23 2126 07/29/23 2211 07/29/23 2345  GLUCAP 53* 44* 59* 167* 162*   Lipid Profile: No results for input(s): "CHOL", "HDL", "LDLCALC", "TRIG", "CHOLHDL", "LDLDIRECT" in the last 72 hours. Thyroid Function Tests: No results for input(s): "TSH", "T4TOTAL", "FREET4", "T3FREE", "THYROIDAB" in the last 72 hours. Anemia Panel: No results for input(s): "VITAMINB12", "FOLATE", "FERRITIN", "TIBC", "IRON", "RETICCTPCT" in the last 72 hours. Urine analysis:    Component Value Date/Time   COLORURINE STRAW (A) 04/11/2018 2000   APPEARANCEUR CLEAR (A) 04/11/2018 2000   LABSPEC 1.009 04/11/2018 2000   PHURINE 8.0 04/11/2018 2000   GLUCOSEU >=500 (A) 04/11/2018 2000   HGBUR NEGATIVE 04/11/2018 2000   BILIRUBINUR NEGATIVE 04/11/2018 2000   KETONESUR 5 (A) 04/11/2018 2000   PROTEINUR 30 (A) 04/11/2018 2000   NITRITE NEGATIVE 04/11/2018 2000   LEUKOCYTESUR NEGATIVE 04/11/2018 2000   Sepsis Labs: @LABRCNTIP (procalcitonin:4,lacticidven:4) )No results found for this or any previous visit (from the past 240 hour(s)).   Radiological Exams on Admission: DG Toe Great Left  Result Date: 07/29/2023 CLINICAL DATA:  Wound  check on left great toe. Diabetic ulcer and debridement done at Wound Clinic yesterday. EXAM: LEFT GREAT TOE COMPARISON:  05/03/2015 FINDINGS: No acute fracture or dislocation. No radiographic evidence of osteomyelitis. Ulceration about the plantar aspect of the first toe. IMPRESSION: 1. No radiographic evidence of osteomyelitis. 2. Ulceration about the plantar aspect of the first toe. Electronically Signed   By: Minerva Fester M.D.   On: 07/29/2023 20:41   DG Chest 2 View  Result Date: 07/29/2023 CLINICAL DATA:  Provided history: Infection. EXAM: CHEST - 2 VIEW COMPARISON:  Prior chest radiographs 04/11/2018 and earlier. FINDINGS: Heart size within normal limits. Aortic atherosclerosis. Small bandlike opacity within the lateral left lung base most consistent with atelectasis. No appreciable airspace consolidation on the right. No evidence of pleural effusion or pneumothorax. No acute osseous abnormality identified. Degenerative changes of the spine. IMPRESSION: 1. Small bandlike opacity within the lateral left lung base most consistent with atelectasis. 2. Aortic Atherosclerosis (ICD10-I70.0). Electronically Signed   By: Jackey Loge D.O.   On: 07/29/2023 17:38      Assessment/Plan Principal Problem:   Diabetic foot ulcer_left great toe Active Problems:   Poorly controlled type 2 diabetes mellitus with circulatory disorder (HCC)   Hypoglycemia   CAD, NATIVE VESSEL   Hyperlipidemia   HYPERTENSION, BENIGN   Morbid obesity with BMI of 40.0-44.9, adult (HCC)   Assessment and Plan:  Diabetic foot ulcer_left great toe: pt may have osteomyelitis.  Has WBC 14.3, normal lactic acid 1.0, clinically not septic.  Consulted Dr. Allena Katz of  podiatry.  - will admit to tele bed as inpatient - Empiric antimicrobial treatment with vancomycin Flagyl, Rocephin - Blood cultures x 2  - ESR and CRP - MRI-left foot - check ABI  Poorly controlled type 2 diabetes mellitus with circulatory disorder [facility]): Recent  A1c 7.8, poorly controlled.  Patient is taking Novolin, Ozempic, metformin, NPH insulin -Hold home medications due to hypoglycemia -Sliding scale insulin  Hypoglycemia: Blood sugar 40-50s -Hold home diabetic medications -Check CBG every 2 hours -As needed D50 -Patient was given 250 cc of D10 in ED  CAD, NATIVE VESSEL: s/p of stent. No CP -Continue aspirin, Crestor -Hold Plavix for possible surgery  Hyperlipidemia -Crestor  HYPERTENSION, BENIGN -IV hydralazine as needed -Coreg, ramipril  Morbid obesity with BMI of 40.0-44.9, adult (HCC): Body weight 118.7 kg, BMI 40.99 -Encourage losing weight -Exercise and healthy diet    DVT ppx: SQ Heparin     Code Status: Full code     Family Communication: not done, no family member is at bed side.  Disposition Plan:  Anticipate discharge back to previous environment  Consults called:   Dr. Allena Katz of podiatry is consulted by EDP.  Admission status and Level of care: for obs   Dispo: The patient is from: Home              Anticipated d/c is to: Home              Anticipated d/c date is: 2 days              Patient currently is not medically stable to d/c.    Severity of Illness:  The appropriate patient status for this patient is INPATIENT. Inpatient status is judged to be reasonable and necessary in order to provide the required intensity of service to ensure the patient's safety. The patient's presenting symptoms, physical exam findings, and initial radiographic and laboratory data in the context of their chronic comorbidities is felt to place them at high risk for further clinical deterioration. Furthermore, it is not anticipated that the patient will be medically stable for discharge from the hospital within 2 midnights of admission.   * I certify that at the point of admission it is my clinical judgment that the patient will require inpatient hospital care spanning beyond 2 midnights from the point of admission due to high  intensity of service, high risk for further deterioration and high frequency of surveillance required.*       Date of Service 07/30/2023    Lorretta Harp [facility]   If 7PM-7AM, please contact night-coverage [email or web address] 07/30/2023, 1:14 AM

## 2023-07-29 NOTE — Plan of Care (Signed)
  Problem: Education: Goal: Understanding of post-operative needs will improve Outcome: Progressing Goal: Individualized Educational Video(s) Outcome: Progressing   Problem: Clinical Measurements: Goal: Postoperative complications will be avoided or minimized Outcome: Progressing   Problem: Respiratory: Goal: Will regain and/or maintain adequate ventilation Outcome: Progressing   Problem: Education: Goal: Knowledge of General Education information will improve Description: Including pain rating scale, medication(s)/side effects and non-pharmacologic comfort measures Outcome: Progressing   Problem: Health Behavior/Discharge Planning: Goal: Ability to manage health-related needs will improve Outcome: Progressing   Problem: Clinical Measurements: Goal: Ability to maintain clinical measurements within normal limits will improve Outcome: Progressing Goal: Will remain free from infection Outcome: Progressing Goal: Diagnostic test results will improve Outcome: Progressing Goal: Respiratory complications will improve Outcome: Progressing Goal: Cardiovascular complication will be avoided Outcome: Progressing   Problem: Activity: Goal: Risk for activity intolerance will decrease Outcome: Progressing   Problem: Nutrition: Goal: Adequate nutrition will be maintained Outcome: Progressing   Problem: Coping: Goal: Level of anxiety will decrease Outcome: Progressing   Problem: Elimination: Goal: Will not experience complications related to bowel motility Outcome: Progressing Goal: Will not experience complications related to urinary retention Outcome: Progressing   Problem: Pain Managment: Goal: General experience of comfort will improve Outcome: Progressing   Problem: Safety: Goal: Ability to remain free from injury will improve Outcome: Progressing   Problem: Skin Integrity: Goal: Risk for impaired skin integrity will decrease Outcome: Progressing

## 2023-07-29 NOTE — Telephone Encounter (Signed)
Received a call from patients niece (also acting as her ADA Advocate) -- Viviann Spare @ [phone].  Said she needs to have someone admit patient into the hospital, not wait in the waiting room - her condition is too fragile.  She saw Dr. Lilian Kapur yesterday who advised she needed to go to the hospital then but the patient refused - (said had to go home to feed her cats).  Spoke with Dr. Lilian Kapur today who confirmed his recommendation from yesterday.  [place] ...   The son lives in [place].  The patient has no one locally.

## 2023-07-29 NOTE — ED Notes (Addendum)
MD aware of BG. Pt asymptomatic. Apple juice provided as well as meal box. Pt states her am sugar was 70 today and she still took all of her short and long acting insulin. Pt states she can usually tell when her sugar is low but not today.

## 2023-07-30 ENCOUNTER — Inpatient Hospital Stay: Payer: Medicare Other

## 2023-07-30 ENCOUNTER — Other Ambulatory Visit: Payer: Self-pay

## 2023-07-30 ENCOUNTER — Encounter: Payer: Self-pay | Admitting: Internal Medicine

## 2023-07-30 DIAGNOSIS — E1159 Type 2 diabetes mellitus with other circulatory complications: Secondary | ICD-10-CM | POA: Diagnosis not present

## 2023-07-30 DIAGNOSIS — E1165 Type 2 diabetes mellitus with hyperglycemia: Secondary | ICD-10-CM | POA: Diagnosis not present

## 2023-07-30 DIAGNOSIS — M86172 Other acute osteomyelitis, left ankle and foot: Secondary | ICD-10-CM | POA: Diagnosis present

## 2023-07-30 DIAGNOSIS — E162 Hypoglycemia, unspecified: Secondary | ICD-10-CM | POA: Diagnosis not present

## 2023-07-30 LAB — CBC
HCT: 35.4 % — ABNORMAL LOW (ref 36.0–46.0)
Hemoglobin: 11.5 g/dL — ABNORMAL LOW (ref 12.0–15.0)
MCH: 29.3 pg (ref 26.0–34.0)
MCHC: 32.5 g/dL (ref 30.0–36.0)
MCV: 90.1 fL (ref 80.0–100.0)
Platelets: 305 10*3/uL (ref 150–400)
RBC: 3.93 MIL/uL (ref 3.87–5.11)
RDW: 12.6 % (ref 11.5–15.5)
WBC: 10.8 10*3/uL — ABNORMAL HIGH (ref 4.0–10.5)
nRBC: 0 % (ref 0.0–0.2)

## 2023-07-30 LAB — BASIC METABOLIC PANEL
Anion gap: 10 (ref 5–15)
BUN: 16 mg/dL (ref 8–23)
CO2: 23 mmol/L (ref 22–32)
Calcium: 8.9 mg/dL (ref 8.9–10.3)
Chloride: 102 mmol/L (ref 98–111)
Creatinine, Ser: 0.73 mg/dL (ref 0.44–1.00)
GFR, Estimated: >60 mL/min (ref >60)
Glucose, Bld: 174 mg/dL — ABNORMAL HIGH (ref 70–99)
Potassium: 4.1 mmol/L (ref 3.5–5.1)
Sodium: 135 mmol/L (ref 135–145)

## 2023-07-30 LAB — GLUCOSE, CAPILLARY
Glucose-Capillary: 141 mg/dL — ABNORMAL HIGH (ref 70–99)
Glucose-Capillary: 112 mg/dL — ABNORMAL HIGH (ref 70–99)
Glucose-Capillary: 143 mg/dL — ABNORMAL HIGH (ref 70–99)
Glucose-Capillary: 158 mg/dL — ABNORMAL HIGH (ref 70–99)
Glucose-Capillary: 232 mg/dL — ABNORMAL HIGH (ref 70–99)

## 2023-07-30 LAB — PROTIME-INR
INR: 1.1 (ref 0.8–1.2)
Prothrombin Time: 14.1 s (ref 11.4–15.2)

## 2023-07-30 LAB — C-REACTIVE PROTEIN: CRP: 2.5 mg/dL — ABNORMAL HIGH (ref <1.0)

## 2023-07-30 LAB — HIV ANTIBODY (ROUTINE TESTING W REFLEX): HIV Screen 4th Generation wRfx: NONREACTIVE

## 2023-07-30 LAB — APTT: aPTT: 30 s (ref 24–36)

## 2023-07-30 MED ORDER — VANCOMYCIN HCL 1750 MG/350ML IV SOLN
1750.0000 mg | INTRAVENOUS | Status: DC
  Administered 2023-07-30: 1750 mg via INTRAVENOUS
  Filled 2023-07-30: qty 350

## 2023-07-30 MED ORDER — ROSUVASTATIN CALCIUM 10 MG PO TABS
10.0000 mg | ORAL_TABLET | Freq: Every day | ORAL | Status: DC
  Administered 2023-07-30 – 2023-08-03 (×4): 10 mg via ORAL
  Filled 2023-07-30 (×4): qty 1

## 2023-07-30 MED ORDER — LATANOPROST 0.005 % OP SOLN
1.0000 [drp] | Freq: Every day | OPHTHALMIC | Status: DC
  Administered 2023-07-30 – 2023-08-02 (×4): 1 [drp] via OPHTHALMIC
  Filled 2023-07-30 (×4): qty 2.5

## 2023-07-30 MED ORDER — POLYVINYL ALCOHOL 1.4 % OP SOLN: 1.0000 [drp] | OPHTHALMIC | Status: DC | PRN

## 2023-07-30 MED ORDER — GABAPENTIN 100 MG PO CAPS
100.0000 mg | ORAL_CAPSULE | Freq: Two times a day (BID) | ORAL | Status: DC
  Administered 2023-07-30 – 2023-08-03 (×8): 100 mg via ORAL
  Filled 2023-07-30 (×8): qty 1

## 2023-07-30 MED ORDER — INSULIN ASPART 100 UNIT/ML IJ SOLN: 0.0000 [IU] | Freq: Every day | INTRAMUSCULAR | Status: DC

## 2023-07-30 MED ORDER — RAMIPRIL 5 MG PO CAPS
10.0000 mg | ORAL_CAPSULE | Freq: Every day | ORAL | Status: DC
  Administered 2023-07-30 – 2023-08-03 (×4): 10 mg via ORAL
  Filled 2023-07-30 (×2): qty 2
  Filled 2023-07-30: qty 1
  Filled 2023-07-30 (×3): qty 2

## 2023-07-30 MED ORDER — FESOTERODINE FUMARATE ER 4 MG PO TB24
4.0000 mg | ORAL_TABLET | Freq: Every day | ORAL | Status: DC
  Filled 2023-07-30: qty 1

## 2023-07-30 MED ORDER — CARVEDILOL 6.25 MG PO TABS
3.1250 mg | ORAL_TABLET | Freq: Two times a day (BID) | ORAL | Status: DC
  Administered 2023-07-30 – 2023-08-03 (×9): 3.125 mg via ORAL
  Filled 2023-07-30 (×9): qty 1

## 2023-07-30 MED ORDER — ICOSAPENT ETHYL 1 G PO CAPS
2.0000 g | ORAL_CAPSULE | Freq: Two times a day (BID) | ORAL | Status: DC
  Administered 2023-07-30 – 2023-08-03 (×8): 2 g via ORAL
  Filled 2023-07-30 (×10): qty 2

## 2023-07-30 MED ORDER — MIRABEGRON ER 25 MG PO TB24
25.0000 mg | ORAL_TABLET | Freq: Every day | ORAL | Status: DC
  Administered 2023-07-30 – 2023-08-03 (×4): 25 mg via ORAL
  Filled 2023-07-30 (×5): qty 1

## 2023-07-30 MED ORDER — INSULIN ASPART 100 UNIT/ML IJ SOLN
0.0000 [IU] | Freq: Three times a day (TID) | INTRAMUSCULAR | Status: DC
  Administered 2023-07-30: 1 [IU] via SUBCUTANEOUS
  Administered 2023-07-30: 2 [IU] via SUBCUTANEOUS
  Administered 2023-07-31: 1 [IU] via SUBCUTANEOUS
  Administered 2023-07-31 – 2023-08-01 (×3): 2 [IU] via SUBCUTANEOUS
  Administered 2023-08-01: 5 [IU] via SUBCUTANEOUS
  Filled 2023-07-30 (×7): qty 1

## 2023-07-30 MED ORDER — ASPIRIN 81 MG PO TBEC
81.0000 mg | DELAYED_RELEASE_TABLET | Freq: Every day | ORAL | Status: DC
  Administered 2023-07-30 – 2023-08-03 (×4): 81 mg via ORAL
  Filled 2023-07-30 (×4): qty 1

## 2023-07-30 MED ORDER — NITROGLYCERIN 0.4 MG SL SUBL: 0.4000 mg | SUBLINGUAL_TABLET | SUBLINGUAL | Status: DC | PRN

## 2023-07-30 NOTE — Assessment & Plan Note (Signed)
Recent A1c 7.8, poorly controlled.  Patient is taking Novolin, Ozempic, metformin, NPH insulin. 10/21 -- sugars in 200's, increase insulin regimen --Semglee 8 units daily --Sliding scale novolog AC/HS --Hold home medications

## 2023-07-30 NOTE — Plan of Care (Signed)
  Problem: Education: Goal: Understanding of post-operative needs will improve Outcome: Progressing Goal: Individualized Educational Video(s) Outcome: Progressing   Problem: Clinical Measurements: Goal: Postoperative complications will be avoided or minimized Outcome: Progressing   Problem: Respiratory: Goal: Will regain and/or maintain adequate ventilation Outcome: Progressing   Problem: Education: Goal: Knowledge of General Education information will improve Description: Including pain rating scale, medication(s)/side effects and non-pharmacologic comfort measures Outcome: Progressing   Problem: Health Behavior/Discharge Planning: Goal: Ability to manage health-related needs will improve Outcome: Progressing   Problem: Clinical Measurements: Goal: Ability to maintain clinical measurements within normal limits will improve Outcome: Progressing Goal: Will remain free from infection Outcome: Progressing Goal: Diagnostic test results will improve Outcome: Progressing Goal: Respiratory complications will improve Outcome: Progressing Goal: Cardiovascular complication will be avoided Outcome: Progressing   Problem: Activity: Goal: Risk for activity intolerance will decrease Outcome: Progressing   Problem: Nutrition: Goal: Adequate nutrition will be maintained Outcome: Progressing   Problem: Coping: Goal: Level of anxiety will decrease Outcome: Progressing   Problem: Elimination: Goal: Will not experience complications related to bowel motility Outcome: Progressing Goal: Will not experience complications related to urinary retention Outcome: Progressing   Problem: Pain Managment: Goal: General experience of comfort will improve Outcome: Progressing   Problem: Safety: Goal: Ability to remain free from injury will improve Outcome: Progressing   Problem: Skin Integrity: Goal: Risk for impaired skin integrity will decrease Outcome: Progressing   Problem:  Education: Goal: Ability to describe self-care measures that may prevent or decrease complications (Diabetes Survival Skills Education) will improve Outcome: Progressing Goal: Individualized Educational Video(s) Outcome: Progressing   Problem: Coping: Goal: Ability to adjust to condition or change in health will improve Outcome: Progressing   Problem: Fluid Volume: Goal: Ability to maintain a balanced intake and output will improve Outcome: Progressing   Problem: Health Behavior/Discharge Planning: Goal: Ability to identify and utilize available resources and services will improve Outcome: Progressing Goal: Ability to manage health-related needs will improve Outcome: Progressing   Problem: Metabolic: Goal: Ability to maintain appropriate glucose levels will improve Outcome: Progressing   Problem: Nutritional: Goal: Maintenance of adequate nutrition will improve Outcome: Progressing Goal: Progress toward achieving an optimal weight will improve Outcome: Progressing   Problem: Skin Integrity: Goal: Risk for impaired skin integrity will decrease Outcome: Progressing   Problem: Tissue Perfusion: Goal: Adequacy of tissue perfusion will improve Outcome: Progressing

## 2023-07-30 NOTE — Plan of Care (Signed)

## 2023-07-30 NOTE — Assessment & Plan Note (Signed)
Resolved.  CBG's evening of 10/17 were 53 >> 44 >> 59 >> 167 --Hypoglycemia protocol --Adjusted insulin regimen accordingly and per diabetes coordinator recs

## 2023-07-30 NOTE — Progress Notes (Signed)
  Subjective:  Patient ID: Betty [name], female    DOB: December 25, 1955,  MRN: [id number]  A 67 y.o. female medical history significant of DM, HTN, HLD, CAD with DES, morbid obesity, OSA not on CPAP, vertigo, peripheral neuropathy who presents with left great toe ulcer.  She states she is doing okay.  She is known to Dr. Abbott Pao.  She denies any other acute complaint she is a diabetic with last A1c of 7.8  Objective:   Vitals:   07/29/23 2358 07/30/23 0742  BP: (!) 156/57 (!) 118/43  Pulse: 74 72  Resp: 18 14  Temp: 97.8 F (36.6 C) 98.2 F (36.8 C)  SpO2: 100% 95%   General AA&O x3. Normal mood and affect.  Vascular Dorsalis pedis and posterior tibial pulses faintly palpable Brisk capillary refill to all digits. Pedal hair present.  Neurologic Epicritic sensation grossly intact.  Dermatologic Left hallux chronic ulceration with fibrogranular wound bed.  Cellulitis noted up to the MPJ.  With fat layer exposed.  No malodor present no purulent drainage noted.  Orthopedic: MMT 5/5 in dorsiflexion, plantarflexion, inversion, and eversion. Normal joint ROM without pain or crepitus.    IMPRESSION: 1. Plantar great toe ulceration with osteomyelitis of the first distal phalanx. 2. Mild marrow edema in the medial aspect of the first proximal phalanx head is nonspecific, but could reflect early osteomyelitis. 3. No abscess.   Assessment & Plan:  Patient was evaluated and treated and all questions answered.  Left hallux ulceration with fat layer exposed with possible osteomyelitis with cellulitis and -All questions and concerns were discussed with the patient in extensive detail -Given the osteomyelitis is present patient will benefit from left hallux amputation. -ABIs PVRs were ordered for vascular assessment as well. -Patient will be scheduled under Dr. Ardelle Anton for Saturday left hallux amputation. -N.p.o. after midnight -Betadine wet-to-dry dressing -Weightbearing as tolerated in  surgical shoe -Patient is high risk of further amputation if it continues regresses.   Candelaria Stagers, DPM  Accessible via secure chat for questions or concerns.

## 2023-07-30 NOTE — Assessment & Plan Note (Signed)
Podiatry following - see recommendations. Continue broad spectrum IV antibiotics - Vanc, Rocephin, Flagyl for now Follow pending ABI's Follow blood cultures, and any operative cultures sent from OR Plan is for L great toe amputation tomorrow 10/19 NPO after midnight

## 2023-07-30 NOTE — Inpatient Diabetes Management (Signed)
Inpatient Diabetes Program Recommendations  AACE/ADA: New Consensus Statement on Inpatient Glycemic Control   Target Ranges:  Prepandial:   less than 140 mg/dL      Peak postprandial:   less than 180 mg/dL (1-2 hours)      Critically ill patients:  140 - 180 mg/dL    Latest Reference Range & Units 07/29/23 20:32 07/29/23 20:57 07/29/23 21:26 07/29/23 22:11 07/29/23 23:45 07/30/23 06:06  Glucose-Capillary 70 - 99 mg/dL 53 (L) 44 (LL) 59 (L) 540 (H) 162 (H) 141 (H)    Latest Reference Range & Units 07/29/23 16:13  Glucose 70 - 99 mg/dL 71    Latest Reference Range & Units 06/04/23 16:19  Hemoglobin A1C 4.0 - 5.6 % 7.8 !   Review of Glycemic Control  Diabetes history: DM2 Outpatient Diabetes medications: NPH 36 units BID, Regular 0-22 units TID with meals, Metformin XR 500 mg BID, Ozempic 1 mg Qweek Current orders for Inpatient glycemic control: Novolog 0-9 units TID with meals, Novolog 0-5 units QHS  Inpatient Diabetes Program Recommendations:    Insulin: Once CBGs consistently over 180 mg/dl with Novolog correction, will likely need to order basal insulin.   NOTE: Patient admitted with foot ulcer of left great toe. Initial lab glucose 71 mg/dl and down to 44 mg/dl at 98:11 on 91/47/82. CBG 141 mg/dl at 9:56 am today. In reviewing chart, noted patient sees Dr. Elvera Lennox (Endocrinologist) and was last seen 06/04/23. Per office note on 06/04/23 patient instructed to:  Please continue: - Metformin XR 500 mg 2x a day - Ozempic 1 mg weekly   Breakfast  Lunch   Dinner bedtime  Insulin N: 36 units -  - 36 units  Insulin R: 20-22 units  20-22 units  20-22 units    -  For sugars: 71-90: take 16 units of R insulin 50-70: do not take R insulin  Thanks, Orlando Penner, RN, MSN, [facility] Diabetes Coordinator Inpatient Diabetes Program [phone] (Team Pager from 8am to 5pm)

## 2023-07-30 NOTE — H&P (View-Only) (Signed)
  Subjective:  Patient ID: Betty [name], female    DOB: December 25, 1955,  MRN: [id number]  A 67 y.o. female medical history significant of DM, HTN, HLD, CAD with DES, morbid obesity, OSA not on CPAP, vertigo, peripheral neuropathy who presents with left great toe ulcer.  She states she is doing okay.  She is known to Dr. Abbott Pao.  She denies any other acute complaint she is a diabetic with last A1c of 7.8  Objective:   Vitals:   07/29/23 2358 07/30/23 0742  BP: (!) 156/57 (!) 118/43  Pulse: 74 72  Resp: 18 14  Temp: 97.8 F (36.6 C) 98.2 F (36.8 C)  SpO2: 100% 95%   General AA&O x3. Normal mood and affect.  Vascular Dorsalis pedis and posterior tibial pulses faintly palpable Brisk capillary refill to all digits. Pedal hair present.  Neurologic Epicritic sensation grossly intact.  Dermatologic Left hallux chronic ulceration with fibrogranular wound bed.  Cellulitis noted up to the MPJ.  With fat layer exposed.  No malodor present no purulent drainage noted.  Orthopedic: MMT 5/5 in dorsiflexion, plantarflexion, inversion, and eversion. Normal joint ROM without pain or crepitus.    IMPRESSION: 1. Plantar great toe ulceration with osteomyelitis of the first distal phalanx. 2. Mild marrow edema in the medial aspect of the first proximal phalanx head is nonspecific, but could reflect early osteomyelitis. 3. No abscess.   Assessment & Plan:  Patient was evaluated and treated and all questions answered.  Left hallux ulceration with fat layer exposed with possible osteomyelitis with cellulitis and -All questions and concerns were discussed with the patient in extensive detail -Given the osteomyelitis is present patient will benefit from left hallux amputation. -ABIs PVRs were ordered for vascular assessment as well. -Patient will be scheduled under Dr. Ardelle Anton for Saturday left hallux amputation. -N.p.o. after midnight -Betadine wet-to-dry dressing -Weightbearing as tolerated in  surgical shoe -Patient is high risk of further amputation if it continues regresses.   Candelaria Stagers, DPM  Accessible via secure chat for questions or concerns.

## 2023-07-30 NOTE — Assessment & Plan Note (Signed)
--  IV hydralazine as needed --Coreg, ramipril

## 2023-07-30 NOTE — Hospital Course (Signed)
HPI on admission 07/29/23: "Betty [name] is a 67 y.o. female with medical history significant of DM, HTN, HLD, CAD with DES, morbid obesity, OSA not on CPAP, vertigo, peripheral neuropathy who presents with left great toe ulcer.   Patient states that she has chronic left great toe ulcer for more than 10 months.  She has been following up with Dr. Lilian Kapur of podiatry. Pt had debridement done at wound clinic yesterday. Per Dr. Vara Guardian clinic note, patient has worsening foot ulcer with possible osteomyelitis. Dr. Lilian Kapur recommended patient to come to the hospital to get MIR and ABI..."  Patient admitted to hospital with Podiatry consulted.  MRI of the foot showed osteomyelitis.  Pt underwennt L great toe amputation 10/19.  Vascular surgery consulted with plan for angiogram 10/21 to assess for PAD.

## 2023-07-30 NOTE — Assessment & Plan Note (Signed)
See osteomyelitis 

## 2023-07-30 NOTE — Plan of Care (Signed)
CHL Tonsillectomy/Adenoidectomy, Postoperative PEDS care plan entered in error.

## 2023-07-30 NOTE — Assessment & Plan Note (Signed)
Body mass index is 40.99 kg/m. Complicates overall care and prognosis.  Recommend lifestyle modifications including physical activity and diet for weight loss and overall long-term health.

## 2023-07-30 NOTE — Assessment & Plan Note (Signed)
-   Crestor 

## 2023-07-30 NOTE — Progress Notes (Signed)
Progress Note   Patient: Betty [name] ZOX:[id number] DOB: [birth year] DOA: 07/29/2023     1 DOS: the patient was seen and examined on 07/30/2023   Brief hospital course: HPI on admission 07/29/23: "Betty [name] is a 67 y.o. female with medical history significant of DM, HTN, HLD, CAD with DES, morbid obesity, OSA not on CPAP, vertigo, peripheral neuropathy who presents with left great toe ulcer.   Patient states that she has chronic left great toe ulcer for more than 10 months.  She has been following up with Dr. Lilian Kapur of podiatry. Pt had debridement done at wound clinic yesterday. Per Dr. Vara Guardian clinic note, patient has worsening foot ulcer with possible osteomyelitis. Dr. Lilian Kapur recommended patient to come to the hospital to get MIR and ABI..."  Patient admitted to hospital with Podiatry consulted.  MRI of the foot showed osteomyelitis.  Podiatry plans for L great toe amputation tomorrow 10/19.  Assessment and Plan: * Diabetic foot ulcer_left great toe See osteomyelitis  Hypoglycemia CBG's overnight 53 >> 44 >> 59 >> 167 --Hypoglycemia protocol --Adjusted insulin regimen accordingly and per diabetes coordinator recs  Poorly controlled type 2 diabetes mellitus with circulatory disorder (HCC) Recent A1c 7.8, poorly controlled.  Patient is taking Novolin, Ozempic, metformin, NPH insulin --Hold home medications --Sliding scale insulin TID AC (stopped HS sliding scale to prevent recurrent hypoglycemia)  CAD, NATIVE VESSEL Stable. No CP. --Continue aspirin, Crestor --Hold Plavix for surgery & resume when okay with podiatry  HYPERTENSION, BENIGN --IV hydralazine as needed --Coreg, ramipril  Hyperlipidemia --Crestor  Morbid obesity with BMI of 40.0-44.9, adult (HCC) Body mass index is 40.99 kg/m. Complicates overall care and prognosis.  Recommend lifestyle modifications including physical activity and diet for weight loss and overall long-term  health.  Acute osteomyelitis of toe of left foot (HCC) Podiatry following - see recommendations. Continue broad spectrum IV antibiotics - Vanc, Rocephin, Flagyl for now Follow pending ABI's Follow blood cultures, and any operative cultures sent from OR Plan is for L great toe amputation tomorrow 10/19 NPO after midnight        Subjective: Pt seen awake sitting up in bed, Dr. Allena Katz with Podiatry in to see pt and discuss plan for surgery tomorrow.  Pt very reluctant to proceed, stating she does not want to lose her toe.   She denies other complaints besides being hungry as she was kept NPO in case of surgery today.   Physical Exam: Vitals:   07/29/23 2358 07/30/23 0742 07/30/23 0944 07/30/23 0946  BP: (!) 156/57 (!) 118/43 120/61   Pulse: 74 72  74  Resp: 18 14    Temp: 97.8 F (36.6 C) 98.2 F (36.8 C)    TempSrc: Oral Oral    SpO2: 100% 95%    Weight: 118.7 kg     Height: 5\' 7"  (1.702 m)      General exam: awake, alert, no acute distress, obese HEENT: moist mucus membranes, hearing grossly normal  Respiratory system: CTAB, no wheezes, rales or rhonchi, normal respiratory effort. Cardiovascular system: normal S1/S2, RRR, no pedal edema.   Gastrointestinal system: soft, NT, ND Central nervous system: A&O x 3. no gross focal neurologic deficits, normal speech Extremities: left great toe ulcer partially covered by clean dry intact gauze dressing Skin: dry, intact, normal temperature Psychiatry: normal mood, congruent affect, judgement and insight appear normal   Data Reviewed:  Notable labs --- glucose 174 otherwise normal BMP.  WBC 10.8, Hbg 11.5  Family Communication: none present. Pt  able to update.  Disposition: Status is: Inpatient Remains inpatient appropriate because: remains on IV antibiotics and surgery planned   Planned Discharge Destination: Home with Home Health    Time spent: 45 minutes  Author: Pennie Banter, DO 07/30/2023 1:38 PM  For on  call review [email or web address].

## 2023-07-30 NOTE — Progress Notes (Signed)
Pharmacy Antibiotic Note  Betty [name] is a 67 y.o. female admitted on 07/29/2023 with diabetic foot ulcer.  Pharmacy has been consulted for Vancomycin dosing.  Plan: Pt given Vancomycin 2000 mg once. Vancomycin 1750 mg IV Q 24 hrs. Goal AUC 400-550. Expected AUC: 502.1 SCr used: 0.8 (10/17 SCr = 0.69), Vd used: 0.5, BMI: 40.9  Pharmacy will continue to follow and will adjust abx dosing whenever warranted.  Temp (24hrs), Avg:97.6 F (36.4 C), Min:97.4 F (36.3 C), Max:97.8 F (36.6 C)   Recent Labs  Lab 07/29/23 1613  WBC 14.3*  CREATININE 0.69  LATICACIDVEN 1.0    Estimated Creatinine Clearance: 89.7 mL/min (by C-G formula based on SCr of 0.69 mg/dL).    Allergies  Allergen Reactions   Lyrica [Pregabalin] Other (See Comments)    sleepy    Antimicrobials this admission: 10/18 Ceftriaxone >>  10/18 Flagyl >>  10/18 Vancomycin >>   Microbiology results: 10/18 BCx: Pending  Thank you for allowing pharmacy to be a part of this patient's care.  Otelia Sergeant, PharmD, [facility] 07/30/2023 12:34 AM

## 2023-07-30 NOTE — Assessment & Plan Note (Signed)
Stable. No CP. --Continue aspirin, Crestor --Hold Plavix for surgery & resume when okay with podiatry

## 2023-07-31 ENCOUNTER — Encounter
Admission: EM | Disposition: A | Discharge status: Home Health | Payer: Self-pay | Source: Home / Self Care | Attending: Internal Medicine

## 2023-07-31 ENCOUNTER — Inpatient Hospital Stay: Payer: Self-pay | Admitting: Anesthesiology

## 2023-07-31 ENCOUNTER — Inpatient Hospital Stay: Payer: Medicare Other | Admitting: Anesthesiology

## 2023-07-31 ENCOUNTER — Inpatient Hospital Stay: Payer: Medicare Other

## 2023-07-31 ENCOUNTER — Other Ambulatory Visit: Payer: Self-pay

## 2023-07-31 DIAGNOSIS — E11621 Type 2 diabetes mellitus with foot ulcer: Secondary | ICD-10-CM | POA: Diagnosis not present

## 2023-07-31 DIAGNOSIS — L97529 Non-pressure chronic ulcer of other part of left foot with unspecified severity: Secondary | ICD-10-CM | POA: Diagnosis not present

## 2023-07-31 DIAGNOSIS — E1159 Type 2 diabetes mellitus with other circulatory complications: Secondary | ICD-10-CM | POA: Diagnosis not present

## 2023-07-31 DIAGNOSIS — M86172 Other acute osteomyelitis, left ankle and foot: Secondary | ICD-10-CM

## 2023-07-31 HISTORY — PX: AMPUTATION TOE: SHX6595

## 2023-07-31 LAB — GLUCOSE, CAPILLARY
Glucose-Capillary: 160 mg/dL — ABNORMAL HIGH (ref 70–99)
Glucose-Capillary: 149 mg/dL — ABNORMAL HIGH (ref 70–99)
Glucose-Capillary: 147 mg/dL — ABNORMAL HIGH (ref 70–99)
Glucose-Capillary: 199 mg/dL — ABNORMAL HIGH (ref 70–99)
Glucose-Capillary: 167 mg/dL — ABNORMAL HIGH (ref 70–99)

## 2023-07-31 SURGERY — AMPUTATION, TOE
Anesthesia: General | Site: Toe | Laterality: Left

## 2023-07-31 MED ORDER — BUPIVACAINE HCL (PF) 0.5 % IJ SOLN
INTRAMUSCULAR | Status: AC
  Filled 2023-07-31: qty 30

## 2023-07-31 MED ORDER — LIDOCAINE HCL (PF) 1 % IJ SOLN
INTRAMUSCULAR | Status: AC
  Filled 2023-07-31: qty 30

## 2023-07-31 MED ORDER — FENTANYL CITRATE (PF) 100 MCG/2ML IJ SOLN: 25.0000 ug | INTRAMUSCULAR | Status: DC | PRN

## 2023-07-31 MED ORDER — SODIUM CHLORIDE 0.9 % IV SOLN
INTRAVENOUS | Status: DC | PRN
Start: 2023-07-31 — End: 2023-07-31

## 2023-07-31 MED ORDER — OXYCODONE HCL 5 MG/5ML PO SOLN: 5.0000 mg | Freq: Once | ORAL | Status: DC | PRN

## 2023-07-31 MED ORDER — STERILE WATER FOR IRRIGATION IR SOLN
Status: DC | PRN
Start: 2023-07-31 — End: 2023-07-31
  Administered 2023-07-31: 500 mL

## 2023-07-31 MED ORDER — FENTANYL CITRATE (PF) 100 MCG/2ML IJ SOLN
INTRAMUSCULAR | Status: AC
  Filled 2023-07-31: qty 2

## 2023-07-31 MED ORDER — VANCOMYCIN HCL IN DEXTROSE 1-5 GM/200ML-% IV SOLN
1000.0000 mg | Freq: Two times a day (BID) | INTRAVENOUS | Status: DC
  Administered 2023-08-01 – 2023-08-02 (×3): 1000 mg via INTRAVENOUS
  Filled 2023-07-31 (×4): qty 200

## 2023-07-31 MED ORDER — PROPOFOL 500 MG/50ML IV EMUL
INTRAVENOUS | Status: DC | PRN
  Administered 2023-07-31: 100 ug/kg/min via INTRAVENOUS

## 2023-07-31 MED ORDER — MIDAZOLAM HCL 2 MG/2ML IJ SOLN
INTRAMUSCULAR | Status: AC
  Filled 2023-07-31: qty 2

## 2023-07-31 MED ORDER — KETAMINE HCL 50 MG/5ML IJ SOSY
PREFILLED_SYRINGE | INTRAMUSCULAR | Status: AC
  Filled 2023-07-31: qty 5

## 2023-07-31 MED ORDER — MIDAZOLAM HCL 2 MG/2ML IJ SOLN
INTRAMUSCULAR | Status: DC | PRN
  Administered 2023-07-31: 2 mg via INTRAVENOUS

## 2023-07-31 MED ORDER — PROPOFOL 10 MG/ML IV BOLUS
INTRAVENOUS | Status: DC | PRN
  Administered 2023-07-31: 40 mg via INTRAVENOUS

## 2023-07-31 MED ORDER — OXYCODONE HCL 5 MG PO TABS: 5.0000 mg | ORAL_TABLET | Freq: Once | ORAL | Status: DC | PRN

## 2023-07-31 MED ORDER — PROPOFOL 10 MG/ML IV BOLUS
INTRAVENOUS | Status: AC
  Filled 2023-07-31: qty 20

## 2023-07-31 MED ORDER — KETAMINE HCL 50 MG/5ML IJ SOSY
PREFILLED_SYRINGE | INTRAMUSCULAR | Status: DC | PRN
Start: 2023-07-31 — End: 2023-07-31
  Administered 2023-07-31 (×3): 10 mg via INTRAVENOUS

## 2023-07-31 MED ORDER — LIDOCAINE HCL 1 % IJ SOLN
INTRAMUSCULAR | Status: DC | PRN
  Administered 2023-07-31: 20 mL via INTRAMUSCULAR

## 2023-07-31 SURGICAL SUPPLY — 51 items
BLADE SURG 10 STRL SS SAFETY (BLADE) IMPLANT
BNDG CMPR 5X4 CHSV STRCH STRL (GAUZE/BANDAGES/DRESSINGS) ×1
BNDG CMPR 75X41 PLY HI ABS (GAUZE/BANDAGES/DRESSINGS) ×1
BNDG CMPR STD VLCR NS LF 5.8X4 (GAUZE/BANDAGES/DRESSINGS) ×1
BNDG COHESIVE 4X5 TAN STRL LF (GAUZE/BANDAGES/DRESSINGS) ×2 IMPLANT
BNDG ELASTIC 4X5.8 VLCR NS LF (GAUZE/BANDAGES/DRESSINGS) ×2 IMPLANT
BNDG ESMARCH 4 X 12 STRL LF (GAUZE/BANDAGES/DRESSINGS) ×1
BNDG ESMARCH 4X12 STRL LF (GAUZE/BANDAGES/DRESSINGS) ×2 IMPLANT
BNDG GAUZE DERMACEA FLUFF 4 (GAUZE/BANDAGES/DRESSINGS) ×2 IMPLANT
BNDG GZE DERMACEA 4 6PLY (GAUZE/BANDAGES/DRESSINGS) ×1
BNDG STRETCH 4X75 STRL LF (GAUZE/BANDAGES/DRESSINGS) ×2 IMPLANT
CNTNR URN SCR LID CUP LEK RST (MISCELLANEOUS) IMPLANT
CONT SPEC 4OZ STRL OR WHT (MISCELLANEOUS) ×1
CUFF TOURN SGL QUICK 18X4 (TOURNIQUET CUFF) ×2 IMPLANT
CUFF TOURN SGL QUICK 24 (TOURNIQUET CUFF)
CUFF TRNQT CYL 24X4X16.5-23 (TOURNIQUET CUFF) ×2 IMPLANT
DRAIN PENROSE 12X.25 LTX STRL (MISCELLANEOUS) IMPLANT
DRAPE FLUOR MINI C-ARM 54X84 (DRAPES) IMPLANT
DRSG EMULSION OIL 3X8 NADH (GAUZE/BANDAGES/DRESSINGS) IMPLANT
DURAPREP 26ML APPLICATOR (WOUND CARE) ×2 IMPLANT
ELECT REM PT RETURN 9FT ADLT (ELECTROSURGICAL) ×1
ELECTRODE REM PT RTRN 9FT ADLT (ELECTROSURGICAL) ×2 IMPLANT
GAUZE PACKING IODOFORM 1/2INX (GAUZE/BANDAGES/DRESSINGS) IMPLANT
GAUZE SPONGE 4X4 12PLY STRL (GAUZE/BANDAGES/DRESSINGS) ×4 IMPLANT
GAUZE XEROFORM 1X8 LF (GAUZE/BANDAGES/DRESSINGS) IMPLANT
GLOVE BIOGEL PI IND STRL 8 (GLOVE) ×2 IMPLANT
GLOVE SURG LX STRL 8.0 MICRO (GLOVE) ×2 IMPLANT
GOWN STRL REUS W/ TWL XL LVL3 (GOWN DISPOSABLE) ×2 IMPLANT
GOWN STRL REUS W/TWL XL LVL3 (GOWN DISPOSABLE) ×1
HANDLE YANKAUER SUCT BULB TIP (MISCELLANEOUS) IMPLANT
KIT TURNOVER KIT A (KITS) ×2 IMPLANT
LABEL OR SOLS (LABEL) IMPLANT
MANIFOLD NEPTUNE II (INSTRUMENTS) ×2 IMPLANT
NDL FILTER BLUNT 18X1 1/2 (NEEDLE) ×2 IMPLANT
NDL SAFETY ECLIPSE 18X1.5 (NEEDLE) ×2 IMPLANT
NEEDLE FILTER BLUNT 18X1 1/2 (NEEDLE) ×1 IMPLANT
NS IRRIG 500ML POUR BTL (IV SOLUTION) ×2 IMPLANT
PACK EXTREMITY ARMC (MISCELLANEOUS) ×2 IMPLANT
PAD ABD DERMACEA PRESS 5X9 (GAUZE/BANDAGES/DRESSINGS) ×4 IMPLANT
SOL PREP PVP 2OZ (MISCELLANEOUS) ×1
SOLUTION PREP PVP 2OZ (MISCELLANEOUS) ×2 IMPLANT
SPONGE T-LAP 18X18 ~~LOC~~+RFID (SPONGE) IMPLANT
STAPLER SKIN PROX 35W (STAPLE) IMPLANT
STOCKINETTE M/LG 89821 (MISCELLANEOUS) ×2 IMPLANT
SUT ETHILON 3-0 FS-10 30 BLK (SUTURE) ×2
SUT PROLENE 3 0 PS 2 (SUTURE) IMPLANT
SUTURE EHLN 3-0 FS-10 30 BLK (SUTURE) IMPLANT
SWAB CULTURE AMIES ANAERIB BLU (MISCELLANEOUS) IMPLANT
SYR 10ML LL (SYRINGE) ×4 IMPLANT
TRAP FLUID SMOKE EVACUATOR (MISCELLANEOUS) ×2 IMPLANT
WATER STERILE IRR 500ML POUR (IV SOLUTION) ×2 IMPLANT

## 2023-07-31 NOTE — Interval H&P Note (Signed)
History and Physical Interval Note:  07/31/2023 7:57 AM  Betty [name]  has presented today for surgery, with the diagnosis of LEFT GREAT TOE ULCERATION WITH Osteomy.  The various methods of treatment have been discussed with the patient and family. After consideration of risks, benefits and other options for treatment, the patient has consented to  Procedure(s): LEFT HALLUX AMPUTATION (Left) as a surgical intervention.  The patient's history has been reviewed, patient examined, no change in status, stable for surgery.  I have reviewed the patient's chart and labs.  Questions were answered to the patient's satisfaction.     Vivi Barrack

## 2023-07-31 NOTE — Consult Note (Signed)
[facility] VASCULAR & VEIN SPECIALISTS Vascular Consult Note  MRN : [id number]  Betty [name] is a 67 y.o. (Nov 07, 1955) female who presents with chief complaint of  Chief Complaint  Patient presents with   Wound Check  .  History of Present Illness: 38 yof diabetic, CAD, HTN, peripheral neuropathy- presents with LEFT great toe diabetic ulcer and osteomyelitis. She has been evaluated by Podiatry and is now s/p LEFT hallux amputation. There was concern regarding her perfusion to the foot. ABI revealed monophasic waveforms PT- 0.8 with [place] of the DP. Patient denies rest pain. She states she does not ambulate much secondary to her arthritis in her knees and does not elicit classic claudication symptomology.  Current Facility-Administered Medications  Medication Dose Route Frequency Provider Last Rate Last Admin   acetaminophen (TYLENOL) tablet 650 mg  650 mg Oral Q6H PRN Lorretta Harp, MD       aspirin EC tablet 81 mg  81 mg Oral Daily Lorretta Harp, MD   81 mg at 07/30/23 0944   carvedilol (COREG) tablet 3.125 mg  3.125 mg Oral BID WC Lorretta Harp, MD   3.125 mg at 07/30/23 1632   cefTRIAXone (ROCEPHIN) 2 g in sodium chloride 0.9 % 100 mL IVPB  2 g Intravenous Q24H Lorretta Harp, MD   Stopped at 07/30/23 2158   dextrose 50 % solution 50 mL  50 mL Intravenous PRN Lorretta Harp, MD       gabapentin (NEURONTIN) capsule 100 mg  100 mg Oral BID Lorretta Harp, MD   100 mg at 07/30/23 2052   heparin injection 5,000 Units  5,000 Units Subcutaneous Alean Rinne, MD   5,000 Units at 07/31/23 1402   hydrALAZINE (APRESOLINE) injection 5 mg  5 mg Intravenous Q2H PRN Lorretta Harp, MD       icosapent Ethyl (VASCEPA) 1 g capsule 2 g  2 g Oral BID Lorretta Harp, MD   2 g at 07/30/23 2051   insulin aspart (novoLOG) injection 0-9 Units  0-9 Units Subcutaneous TID WC Lorretta Harp, MD   1 Units at 07/31/23 1223   latanoprost (XALATAN) 0.005 % ophthalmic solution 1 drop  1 drop Both Eyes QHS Lorretta Harp, MD   1 drop at 07/30/23 2053    metroNIDAZOLE (FLAGYL) IVPB 500 mg  500 mg Intravenous Willette Pa, MD   Stopped at 07/30/23 2304   mirabegron ER (MYRBETRIQ) tablet 25 mg  25 mg Oral Daily Lorretta Harp, MD   25 mg at 07/30/23 0946   nitroGLYCERIN (NITROSTAT) SL tablet 0.4 mg  0.4 mg Sublingual Q5 min PRN Lorretta Harp, MD       ondansetron [facility]) injection 4 mg  4 mg Intravenous Q8H PRN Lorretta Harp, MD       polyvinyl alcohol (LIQUIFILM TEARS) 1.4 % ophthalmic solution 1 drop  1 drop Both Eyes PRN Nazari, Walid A, RPH       ramipril (ALTACE) capsule 10 mg  10 mg Oral Daily Lorretta Harp, MD   10 mg at 07/30/23 0944   rosuvastatin (CRESTOR) tablet 10 mg  10 mg Oral Daily Lorretta Harp, MD   10 mg at 07/30/23 0944   vancomycin (VANCOCIN) IVPB 1000 mg/200 mL premix  1,000 mg Intravenous Q12H Ronnald Ramp, [facility]        Past Medical History:  Diagnosis Date   Arthritis    CAD, NATIVE VESSEL    a. S/p LAD stent in 2006 b. S/p DES to Lcx 08/2019 and patent LAD  stent   DM (diabetes mellitus) (HCC)    Fibromyalgia    HYPERLIPIDEMIA-MIXED    HYPERTENSION, BENIGN    Neuropathy    Obesity    Osteoarthritis    Sleep apnea    Patient reported on 04/12/13   Vertigo     Past Surgical History:  Procedure Laterality Date   BREATH TEK H PYLORI N/A 03/08/2013   Procedure: BREATH TEK H PYLORI;  Surgeon: Atilano Ina, MD;  Location: [facility];  Service: General;  Laterality: N/A;   CESAREAN SECTION     CHOLECYSTECTOMY  1996   CORONARY ANGIOPLASTY WITH STENT PLACEMENT  11/2004   DES - LAD   CORONARY STENT INTERVENTION N/A 09/06/2019   Procedure: CORONARY STENT INTERVENTION;  Surgeon: Tonny Bollman, MD;  Location: [facility] INVASIVE CV LAB;  Service: Cardiovascular;  Laterality: N/A;   CORONARY STENT PLACEMENT  11/2004   RIGHT/LEFT HEART CATH AND CORONARY ANGIOGRAPHY N/A 09/06/2019   Procedure: RIGHT/LEFT HEART CATH AND CORONARY ANGIOGRAPHY;  Surgeon: Tonny Bollman, MD;  Location: [facility] INVASIVE CV LAB;  Service: Cardiovascular;  Laterality:  N/A;    Social History Social History   Tobacco Use   Smoking status: Never   Smokeless tobacco: Never  Vaping Use   Vaping status: Never Used  Substance Use Topics   Alcohol use: No   Drug use: No    Family History Family History  Problem Relation Age of Onset   Heart disease Mother    Cancer Mother        ovarian   Arthritis Mother    Diabetes Mother    Hypertension Mother    Hypertension Father    Stroke Father    Diabetes Sister    Cancer Other    Coronary artery disease Other    Diabetes Other     Allergies  Allergen Reactions   Lyrica [Pregabalin] Other (See Comments)    sleepy     REVIEW OF SYSTEMS (Negative unless checked)  Constitutional: [] Weight loss  [] Fever  [] Chills Cardiac: [] Chest pain   [] Chest pressure   [] Palpitations   [] Shortness of breath when laying flat   [] Shortness of breath at rest   [] Shortness of breath with exertion. Vascular:  [x] Pain in legs with walking   [] Pain in legs at rest   [] Pain in legs when laying flat   [] Claudication   [] Pain in feet when walking  [] Pain in feet at rest  [] Pain in feet when laying flat   [] History of DVT   [] Phlebitis   [] Swelling in legs   [] Varicose veins   [] Non-healing ulcers Pulmonary:   [] Uses home oxygen   [] Productive cough   [] Hemoptysis   [] Wheeze  [] COPD   [] Asthma Neurologic:  [] Dizziness  [] Blackouts   [] Seizures   [] History of stroke   [] History of TIA  [] Aphasia   [] Temporary blindness   [] Dysphagia   [] Weakness or numbness in arms   [] Weakness or numbness in legs Musculoskeletal:  [x] Arthritis   [] Joint swelling   [] Joint pain   [] Low back pain Hematologic:  [] Easy bruising  [] Easy bleeding   [] Hypercoagulable state   [] Anemic  [] Hepatitis Gastrointestinal:  [] Blood in stool   [] Vomiting blood  [] Gastroesophageal reflux/heartburn   [] Difficulty swallowing. Genitourinary:  [] Chronic kidney disease   [] Difficult urination  [] Frequent urination  [] Burning with urination   [] Blood in  urine Skin:  [] Rashes   [] Ulcers   [] Wounds Psychological:  [] History of anxiety   []  History of major depression.  Physical Examination  Vitals:   07/31/23 0900 07/31/23 0915 07/31/23 0930 07/31/23 1037  BP: 120/63 (!) 121/53 (!) 124/50 (!) 125/52  Pulse: 65 64 63 65  Resp: 17 17 18 18   Temp:   98.3 F (36.8 C) 98.2 F (36.8 C)  TempSrc:      SpO2: 97% 97% 96% 100%  Weight:      Height:       Body mass index is 40.99 kg/m. Gen:  WD/WN, NAD Head: [place]/AT, No temporalis wasting. Prominent temp pulse not noted. Pulmonary:  Good air movement, respirations not labored, equal bilaterally.  Cardiac: RRR, normal S1, S2. Vascular:  Vessel Right Left  Radial Palpable Palpable  Ulnar    Brachial    Carotid Palpable, without bruit Palpable, without bruit  Aorta Not palpable N/A  Femoral Palpable Palpable  Popliteal    PT    DP  Surgical dressing in place, warm to ankle   Gastrointestinal: soft, non-tender/non-distended. No guarding/reflex.  Musculoskeletal: M/S 5/5 throughout.  LEFT foot dressing in place  No deformity or atrophy. No edema.      CBC Lab Results  Component Value Date   WBC 10.8 (H) 07/30/2023   HGB 11.5 (L) 07/30/2023   HCT 35.4 (L) 07/30/2023   MCV 90.1 07/30/2023   PLT 305 07/30/2023    BMET    Component Value Date/Time   NA 135 07/30/2023 0037   NA 138 09/29/2019 1001   K 4.1 07/30/2023 0037   CL 102 07/30/2023 0037   CO2 23 07/30/2023 0037   GLUCOSE 174 (H) 07/30/2023 0037   BUN 16 07/30/2023 0037   BUN 15 09/29/2019 1001   CREATININE 0.73 07/30/2023 0037   CREATININE 0.66 06/04/2023 1533   CALCIUM 8.9 07/30/2023 0037   GFRNONAA >60 07/30/2023 0037   GFRNONAA 93 06/17/2017 1644   GFRAA 106 09/29/2019 1001   GFRAA 108 06/17/2017 1644   Estimated Creatinine Clearance: 89.7 mL/min (by C-G formula based on SCr of 0.73 mg/dL).  COAG Lab Results  Component Value Date   INR 1.1 07/30/2023   INR 1.0 06/11/2021    Radiology DG Foot 2  Views Left  Result Date: 07/31/2023 CLINICAL DATA:  67 year old female status post amputation of the great toe. EXAM: LEFT FOOT - 2 VIEW COMPARISON:  07/29/2023. FINDINGS: Patient is status post amputation of the first digit beyond the first metatarsal. No acute displaced fracture or dislocation noted. No destructive bony lesions. Extensive degenerative changes of osteoarthritis are noted throughout the mid and hindfoot. Small plantar and dorsal calcaneal spurs are incidentally noted. Vascular calcifications are noted. IMPRESSION: 1. Status post great toe amputation without acute complicating features. Electronically Signed   By: Trudie Reed M.D.   On: 07/31/2023 12:44   US ARTERIAL ABI (SCREENING LOWER EXTREMITY)  Result Date: 07/30/2023 CLINICAL DATA:  Left first toe ulcer with osteomyelitis. History diabetes, hypertension and hyperlipidemia. EXAM: NONINVASIVE PHYSIOLOGIC VASCULAR STUDY OF BILATERAL LOWER EXTREMITIES TECHNIQUE: Evaluation of both lower extremities were performed at rest, including calculation of ankle-brachial indices with single level Doppler, pressure and pulse volume recording. COMPARISON:  None Available. FINDINGS: Right ABI: 0.74 based on posterior tibial artery pressure. The dorsalis pedis artery is noncompressible. Left ABI: 0.80 based on posterior tibial artery pressure. The dorsalis pedis artery is noncompressible. Right Lower Extremity: Monophasic posterior tibial and biphasic dorsalis pedis artery waveforms. Left Lower Extremity: Monophasic posterior tibial and dorsalis pedis waveforms. 0.5-0.79 Moderate PAD IMPRESSION: Mild to moderate bilateral ABI depression at rest of 0.74 on the  right and 0.80 on the left. Bilateral dorsalis pedis arteries are noncompressible, likely on the basis of vascular calcification. Electronically Signed   By: Irish Lack M.D.   On: 07/30/2023 17:02   MRI Left foot without contrast  Result Date: 07/30/2023 CLINICAL DATA:  Left great toe  infection. EXAM: MRI OF THE LEFT FOOT WITHOUT CONTRAST TECHNIQUE: Multiplanar, multisequence MR imaging of the left forefoot was performed. No intravenous contrast was administered. COMPARISON:  Left great toe x-rays from yesterday. FINDINGS: Bones/Joint/Cartilage Diffuse edema with corresponding decreased T1 marrow signal involving first distal phalanx. Mild marrow edema in the medial aspect of the first proximal phalanx head. No fracture or dislocation. Midfoot degenerative changes. No joint effusion. Ligaments Collateral ligaments are intact. Muscles and Tendons Flexor and extensor tendons are intact. No tenosynovitis. Increased T2 signal within and atrophy of the intrinsic muscles of the forefoot, nonspecific, but likely related to diabetic muscle changes. Soft tissue Diffuse soft tissue swelling. Plantar great toe ulceration. No fluid collection. No soft tissue mass. IMPRESSION: 1. Plantar great toe ulceration with osteomyelitis of the first distal phalanx. 2. Mild marrow edema in the medial aspect of the first proximal phalanx head is nonspecific, but could reflect early osteomyelitis. 3. No abscess. Electronically Signed   By: Obie Dredge M.D.   On: 07/30/2023 10:48   DG Toe Great Left  Result Date: 07/29/2023 CLINICAL DATA:  Wound check on left great toe. Diabetic ulcer and debridement done at [facility] yesterday. EXAM: LEFT GREAT TOE COMPARISON:  05/03/2015 FINDINGS: No acute fracture or dislocation. No radiographic evidence of osteomyelitis. Ulceration about the plantar aspect of the first toe. IMPRESSION: 1. No radiographic evidence of osteomyelitis. 2. Ulceration about the plantar aspect of the first toe. Electronically Signed   By: Minerva Fester M.D.   On: 07/29/2023 20:41   DG Chest 2 View  Result Date: 07/29/2023 CLINICAL DATA:  Provided history: Infection. EXAM: CHEST - 2 VIEW COMPARISON:  Prior chest radiographs 04/11/2018 and earlier. FINDINGS: Heart size within normal limits.  Aortic atherosclerosis. Small bandlike opacity within the lateral left lung base most consistent with atelectasis. No appreciable airspace consolidation on the right. No evidence of pleural effusion or pneumothorax. No acute osseous abnormality identified. Degenerative changes of the spine. IMPRESSION: 1. Small bandlike opacity within the lateral left lung base most consistent with atelectasis. 2. Aortic Atherosclerosis (ICD10-I70.0). Electronically Signed   By: Jackey Loge D.O.   On: 07/29/2023 17:38      Assessment/Plan 1. LEFT great toe diabetic foot wound/Osteomyelitis 2. Will plan for angiogram with possible intervention on Monday 3. Discussed with patient at length, all questions answered, understanding expressed.   Bertram Denver, MD  07/31/2023 2:57 PM    This note was created with Dragon medical transcription system.  Any error is purely unintentional

## 2023-07-31 NOTE — Progress Notes (Signed)
Progress Note   Patient: Betty [name] WUJ:[id number] DOB: 02/07/1956 DOA: 07/29/2023     2 DOS: the patient was seen and examined on 07/31/2023   Brief hospital course: HPI on admission 07/29/23: "Betty [name] is a 67 y.o. female with medical history significant of DM, HTN, HLD, CAD with DES, morbid obesity, OSA not on CPAP, vertigo, peripheral neuropathy who presents with left great toe ulcer.   Patient states that she has chronic left great toe ulcer for more than 10 months.  She has been following up with Dr. Lilian Kapur of podiatry. Pt had debridement done at wound clinic yesterday. Per Dr. Vara Guardian clinic note, patient has worsening foot ulcer with possible osteomyelitis. Dr. Lilian Kapur recommended patient to come to the hospital to get MIR and ABI..."  Patient admitted to hospital with Podiatry consulted.  MRI of the foot showed osteomyelitis.  Podiatry plans for L great toe amputation tomorrow 10/19.  Assessment and Plan: * Diabetic foot ulcer_left great toe See osteomyelitis  Hypoglycemia CBG's overnight 53 >> 44 >> 59 >> 167 --Hypoglycemia protocol --Adjusted insulin regimen accordingly and per diabetes coordinator recs  Poorly controlled type 2 diabetes mellitus with circulatory disorder (HCC) Recent A1c 7.8, poorly controlled.  Patient is taking Novolin, Ozempic, metformin, NPH insulin --Hold home medications --Sliding scale insulin TID AC (stopped HS sliding scale to prevent recurrent hypoglycemia)  CAD, NATIVE VESSEL Stable. No CP. --Continue aspirin, Crestor --Hold Plavix for surgery & resume when okay with podiatry  HYPERTENSION, BENIGN --IV hydralazine as needed --Coreg, ramipril  Hyperlipidemia --Crestor  Morbid obesity with BMI of 40.0-44.9, adult (HCC) Body mass index is 40.99 kg/m. Complicates overall care and prognosis.  Recommend lifestyle modifications including physical activity and diet for weight loss and overall long-term  health.  Acute osteomyelitis of toe, left [facility]) Podiatry consulted. Now s/p L great toe amputation this AM Follow surgical path / cultures Continue broad spectrum IV antibiotics - Vanc, Rocephin, Flagyl for now Vascular surgery consult for evaluation.        Subjective: Pt seen awake sitting up in bed after surgery today.  She continues to question how this happened and why. Explain circulation issues in diabetes can lead to these infections.  She denies pain or acute symptomatic complaints.   Physical Exam: Vitals:   07/31/23 0930 07/31/23 1037 07/31/23 1739 07/31/23 1957  BP: (!) 124/50 (!) 125/52 (!) 148/47 (!) 157/57  Pulse: 63 65 74 71  Resp: 18 18 18 18   Temp: 98.3 F (36.8 C) 98.2 F (36.8 C) (!) 97.5 F (36.4 C) 98 F (36.7 C)  TempSrc:    Oral  SpO2: 96% 100% 96% 99%  Weight:      Height:       General exam: awake, alert, no acute distress, obese HEENT: moist mucus membranes, hearing grossly normal  Respiratory system: CTAB, no wheezes, rales or rhonchi, normal respiratory effort. Cardiovascular system: normal S1/S2, RRR, no pedal edema.   Gastrointestinal system: soft, NT, ND Central nervous system: A&O x 3. no gross focal neurologic deficits, normal speech Extremities: left foot dressed and ace wrapped Skin: dry, intact, normal temperature Psychiatry: normal mood, congruent affect, judgement and insight appear normal   Data Reviewed:  No new labs today.  CBG's 160 >> 149 >> 199  Consults - Podiatry, Vascular Surgery  Family Communication: none present. Pt able to update.  Disposition: Status is: Inpatient Remains inpatient appropriate because: remains on IV antibiotics pending cultures / surgical path.  PT eval tomorrow  Planned Discharge Destination: Home with Home Health    Time spent: 40 minutes  Author: Pennie Banter, DO 07/31/2023 8:08 PM  For on call review [email or web address].

## 2023-07-31 NOTE — Progress Notes (Signed)
Mobility Specialist - Progress Note   07/31/23 0919  Mobility  Activity Off unit   Pt off unit during attempt, will attempt again at another date/time.   Terrilyn Saver  Mobility Specialist  07/31/23 9:20 AM

## 2023-07-31 NOTE — Plan of Care (Signed)

## 2023-07-31 NOTE — Progress Notes (Signed)
[place] [facility] consulted yesterday for preop cardiovascular eval prior to amputation.  Patient taken to the OR this a.m., as per report/notes underwent left toe amputation successfully for management of osteomyelitis.  Postop care as per surgical team/primary team.  Recommend restarting outpatient cardiac medications as hemodynamics and clinical course permits.  Takes aspirin, Plavix due to CAD.  Let us know if any additional input is needed.  Signed, Debbe Odea, M.D. 07/31/23 [facility] [place], [place] [phone]

## 2023-07-31 NOTE — Anesthesia Preprocedure Evaluation (Signed)
Anesthesia Evaluation  Patient identified by MRN, date of birth, ID band Patient awake    Reviewed: Allergy & Precautions, NPO status , Patient's Chart, lab work & pertinent test results  History of Anesthesia Complications Negative for: history of anesthetic complications  Airway Mallampati: III  TM Distance: >3 FB Neck ROM: full    Dental  (+) Chipped, Dental Advidsory Given   Pulmonary sleep apnea    Pulmonary exam normal        Cardiovascular hypertension, + CAD, + Cardiac Stents and + Peripheral Vascular Disease  Normal cardiovascular exam     Neuro/Psych negative neurological ROS  negative psych ROS   GI/Hepatic negative GI ROS, Neg liver ROS,,,  Endo/Other  negative endocrine ROSdiabetes, Poorly Controlled    Renal/GU negative Renal ROS  negative genitourinary   Musculoskeletal   Abdominal   Peds  Hematology negative hematology ROS (+)   Anesthesia Other Findings Patient last took her GLP-1 6 days ago. Patient states she feels fine this morning and denies any symptoms of n/v or any feeling of being bloated. Last ate dinner more than 12 hours ago. Discussed the risks of general anesthesia with an ETT and the risks/benefits of propofol sedation with natural airway. Patient was very against a breathing tube and would like to proceed without. Discussed that if she has any signs of vomiting or retching, we would place an ETT. Patient understood and agreed to proceed.    Past Medical History: No date: Arthritis No date: CAD, NATIVE VESSEL     Comment:  a. S/p LAD stent in 2006 b. S/p DES to Lcx 08/2019 and               patent LAD stent No date: DM (diabetes mellitus) (HCC) No date: Fibromyalgia No date: HYPERLIPIDEMIA-MIXED No date: HYPERTENSION, BENIGN No date: Neuropathy No date: Obesity No date: Osteoarthritis No date: Sleep apnea     Comment:  Patient reported on 04/12/13 No date: Vertigo  Past  Surgical History: 03/08/2013: BREATH TEK H PYLORI; N/A     Comment:  Procedure: BREATH TEK H PYLORI;  Surgeon: Atilano Ina,              MD;  Location: [facility];  Service: General;                Laterality: N/A; No date: CESAREAN SECTION 1996: CHOLECYSTECTOMY 11/2004: CORONARY ANGIOPLASTY WITH STENT PLACEMENT     Comment:  DES - LAD 09/06/2019: CORONARY STENT INTERVENTION; N/A     Comment:  Procedure: CORONARY STENT INTERVENTION;  Surgeon:               Tonny Bollman, MD;  Location: [facility] INVASIVE CV LAB;                Service: Cardiovascular;  Laterality: N/A; 11/2004: CORONARY STENT PLACEMENT 09/06/2019: RIGHT/LEFT HEART CATH AND CORONARY ANGIOGRAPHY; N/A     Comment:  Procedure: RIGHT/LEFT HEART CATH AND CORONARY               ANGIOGRAPHY;  Surgeon: Tonny Bollman, MD;  Location: [facility]              INVASIVE CV LAB;  Service: Cardiovascular;  Laterality:               N/A;  BMI    Body Mass Index: 40.99 kg/m      Reproductive/Obstetrics negative OB ROS  Anesthesia Physical Anesthesia Plan  ASA: 3  Anesthesia Plan: General   Post-op Pain Management: Minimal or no pain anticipated   Induction: Intravenous  PONV Risk Score and Plan: 3 and Propofol infusion, TIVA and Ondansetron  Airway Management Planned: Nasal Cannula  Additional Equipment: None  Intra-op Plan:   Post-operative Plan:   Informed Consent: I have reviewed the patients History and Physical, chart, labs and discussed the procedure including the risks, benefits and alternatives for the proposed anesthesia with the patient or authorized representative who has indicated his/her understanding and acceptance.     Dental advisory given  Plan Discussed with: CRNA and Surgeon  Anesthesia Plan Comments: (Discussed risks of anesthesia with patient, including possibility of difficulty with spontaneous ventilation under anesthesia necessitating airway  intervention, PONV, and rare risks such as cardiac or respiratory or neurological events, and allergic reactions. Discussed the role of CRNA in patient's perioperative care. Patient understands.)       Anesthesia Quick Evaluation

## 2023-07-31 NOTE — Transfer of Care (Signed)
Immediate Anesthesia Transfer of Care Note  Patient: Betty [name]  Procedure(s) Performed: Procedure(s): LEFT HALLUX AMPUTATION (Left)  Patient Location: PACU  Anesthesia Type:General  Level of Consciousness: sedated  Airway & Oxygen Therapy: Patient Spontanous Breathing and Patient connected to face mask oxygen  Post-op Assessment: Report given to RN and Post -op Vital signs reviewed and stable  Post vital signs: Reviewed and stable  Last Vitals:  Vitals:   07/30/23 2035 07/31/23 0257  BP: (!) 126/49 (!) 168/57  Pulse: 69 65  Resp: 18 18  Temp: 36.5 C 36.4 C  SpO2: 98% 97%    Complications: No apparent anesthesia complications

## 2023-07-31 NOTE — Op Note (Addendum)
07/31/2023  8:40 AM  PATIENT:  Betty [name]  67 y.o. female  PRE-OPERATIVE DIAGNOSIS:  LEFT GREAT TOE ULCERATION WITH OSTEOMYELITIS  POST-OPERATIVE DIAGNOSIS:  LEFT GREAT TOE ULCERATION WITH OSTEOMYELITIS  PROCEDURE:  Procedure(s): LEFT HALLUX AMPUTATION (Left)  SURGEON:  Surgeons and Role:    * Vivi Barrack, DPM - Primary  PHYSICIAN ASSISTANT:   ASSISTANTS: none   ANESTHESIA:   MAC  EBL:  Minimal   BLOOD ADMINISTERED:none  DRAINS: none   LOCAL MEDICATIONS USED:  MARCAINE   , BUPIVICAINE , and Amount: 20 ml  SPECIMEN:  Source of Specimen:  toe for pathology, culture, tissue culture (from ulcer/bone)  DISPOSITION OF SPECIMEN:  PATHOLOGY  COUNTS:  YES  TOURNIQUET:  * Missing tourniquet times found for documented tourniquets in log: [id number] *  DICTATION: .Reubin Milan Dictation  PLAN OF CARE: Admit to inpatient   PATIENT DISPOSITION:  PACU - hemodynamically stable.   Delay start of Pharmacological VTE agent (>24hrs) due to surgical blood loss or risk of bleeding: no  Intraoperative findings: Underwent hallux amputation left side.  Remaining bone, metatarsal pad to be viable.  Minimal bleeding during surgery.  Incision closed without tension.  Will plan on dressing change tomorrow and recommended vascular consult.  Indications for surgery: 67 year old female presents to the hospital from clinic given worsening of the ulceration of her left big toe.  MRI did confirm osteomyelitis.  I had an extensive conversation with her preoperatively in regards to treatment options including trying to salvage the toe versus amputation.  We discussed pros and cons of each.  After a long discussion she agrees to proceed with amputation of the toe.  All alternatives, risks, complications were discussed.  No promises or guarantees given second to the procedure and all questions were answered the best my ability.  Procedure in detail: The patient was both verbally and visually  identified by myself, nursing staff, anesthesia staff preoperatively.  She was then transferred to the operating room via stretcher with the surgery took place.  After an adequate plane of anesthesia was obtained timeout was performed and a mixture of lidocaine, Marcaine plain was infiltrated in a regional block fashion of the left foot for total of 20 mL.  Left lower extremity and scrubbed, prepped, draped in normal sterile fashion.  At this time a modified racquet shaped incision was made along the first MTPJ with a #15 blade scalpel from skin to bone.  The MTPJ was identified the toe was disarticulated this level and passed off the table and sent to pathology.  Prior to this I did take a tissue culture of the wound, bone underneath and sent this for separate culture.  At this time I copiously irrigated the wound with saline hemostasis achieved.  I took a culture at the level MTPJ at this time.  The remaining bone appeared to be viable no proximal tracking.  Irrigated with saline hemostasis achieved.  I closed the incision with 3-0 nylon without tension.  Xeroform was applied followed by dry sterile dressing.  She was awoken from anesthesia and found to tolerate the procedure without any complications.  Transferred to PACU vital sign stable vascular status intact.  Postoperative course: Remain inpatient on IV antibiotics.  Weightbearing as tolerated in surgical shoe although limited.  Physical therapy tomorrow.  Recommend vascular consult.  I called the patients so to go over the surgery preop and also postop update. He is concerned about further amputation which we discussed is always a possibility but  hopefully after getting the infection out and vascular evaluation this will help prevent it from occurring. He also relates he is coming out here to stay as part of the issue is her motivation going to appointments. He is going to try to help.

## 2023-07-31 NOTE — Anesthesia Postprocedure Evaluation (Signed)
Anesthesia Post Note  Patient: Betty [name]  Procedure(s) Performed: LEFT HALLUX AMPUTATION (Left: Toe)  Patient location during evaluation: PACU Anesthesia Type: General Level of consciousness: awake and alert Pain management: pain level controlled Vital Signs Assessment: post-procedure vital signs reviewed and stable Respiratory status: spontaneous breathing, nonlabored ventilation, respiratory function stable and patient connected to nasal cannula oxygen Cardiovascular status: blood pressure returned to baseline and stable Postop Assessment: no apparent nausea or vomiting Anesthetic complications: no  No notable events documented.   Last Vitals:  Vitals:   07/31/23 0915 07/31/23 0930  BP: (!) 121/53 (!) 124/50  Pulse: 64 63  Resp: 17 18  Temp:  36.8 C  SpO2: 97% 96%    Last Pain:  Vitals:   07/31/23 0715  TempSrc:   PainSc: 0-No pain                 Stephanie Coup

## 2023-07-31 NOTE — H&P (View-Only) (Signed)
[facility] Vascular Consult Note  MRN : [id number]  Betty [name] is a 67 y.o. (Nov 07, 1955) female who presents with chief complaint of  Chief Complaint  Patient presents with   Wound Check  .  History of Present Illness: 38 yof diabetic, CAD, HTN, peripheral neuropathy- presents with LEFT great toe diabetic ulcer and osteomyelitis. She has been evaluated by Podiatry and is now s/p LEFT hallux amputation. There was concern regarding her perfusion to the foot. ABI revealed monophasic waveforms PT- 0.8 with [place] of the DP. Patient denies rest pain. She states she does not ambulate much secondary to her arthritis in her knees and does not elicit classic claudication symptomology.  Current Facility-Administered Medications  Medication Dose Route Frequency Provider Last Rate Last Admin   acetaminophen (TYLENOL) tablet 650 mg  650 mg Oral Q6H PRN Lorretta Harp, MD       aspirin EC tablet 81 mg  81 mg Oral Daily Lorretta Harp, MD   81 mg at 07/30/23 0944   carvedilol (COREG) tablet 3.125 mg  3.125 mg Oral BID WC Lorretta Harp, MD   3.125 mg at 07/30/23 1632   cefTRIAXone (ROCEPHIN) 2 g in sodium chloride 0.9 % 100 mL IVPB  2 g Intravenous Q24H Lorretta Harp, MD   Stopped at 07/30/23 2158   dextrose 50 % solution 50 mL  50 mL Intravenous PRN Lorretta Harp, MD       gabapentin (NEURONTIN) capsule 100 mg  100 mg Oral BID Lorretta Harp, MD   100 mg at 07/30/23 2052   heparin injection 5,000 Units  5,000 Units Subcutaneous Alean Rinne, MD   5,000 Units at 07/31/23 1402   hydrALAZINE (APRESOLINE) injection 5 mg  5 mg Intravenous Q2H PRN Lorretta Harp, MD       icosapent Ethyl (VASCEPA) 1 g capsule 2 g  2 g Oral BID Lorretta Harp, MD   2 g at 07/30/23 2051   insulin aspart (novoLOG) injection 0-9 Units  0-9 Units Subcutaneous TID WC Lorretta Harp, MD   1 Units at 07/31/23 1223   latanoprost (XALATAN) 0.005 % ophthalmic solution 1 drop  1 drop Both Eyes QHS Lorretta Harp, MD   1 drop at 07/30/23 2053    metroNIDAZOLE (FLAGYL) IVPB 500 mg  500 mg Intravenous Willette Pa, MD   Stopped at 07/30/23 2304   mirabegron ER (MYRBETRIQ) tablet 25 mg  25 mg Oral Daily Lorretta Harp, MD   25 mg at 07/30/23 0946   nitroGLYCERIN (NITROSTAT) SL tablet 0.4 mg  0.4 mg Sublingual Q5 min PRN Lorretta Harp, MD       ondansetron [facility]) injection 4 mg  4 mg Intravenous Q8H PRN Lorretta Harp, MD       polyvinyl alcohol (LIQUIFILM TEARS) 1.4 % ophthalmic solution 1 drop  1 drop Both Eyes PRN Nazari, Walid A, RPH       ramipril (ALTACE) capsule 10 mg  10 mg Oral Daily Lorretta Harp, MD   10 mg at 07/30/23 0944   rosuvastatin (CRESTOR) tablet 10 mg  10 mg Oral Daily Lorretta Harp, MD   10 mg at 07/30/23 0944   vancomycin (VANCOCIN) IVPB 1000 mg/200 mL premix  1,000 mg Intravenous Q12H Ronnald Ramp, [facility]        Past Medical History:  Diagnosis Date   Arthritis    CAD, NATIVE VESSEL    a. S/p LAD stent in 2006 b. S/p DES to Lcx 08/2019 and patent LAD  stent   DM (diabetes mellitus) (HCC)    Fibromyalgia    HYPERLIPIDEMIA-MIXED    HYPERTENSION, BENIGN    Neuropathy    Obesity    Osteoarthritis    Sleep apnea    Patient reported on 04/12/13   Vertigo     Past Surgical History:  Procedure Laterality Date   BREATH TEK H PYLORI N/A 03/08/2013   Procedure: BREATH TEK H PYLORI;  Surgeon: Atilano Ina, MD;  Location: [facility];  Service: General;  Laterality: N/A;   CESAREAN SECTION     CHOLECYSTECTOMY  1996   CORONARY ANGIOPLASTY WITH STENT PLACEMENT  11/2004   DES - LAD   CORONARY STENT INTERVENTION N/A 09/06/2019   Procedure: CORONARY STENT INTERVENTION;  Surgeon: Tonny Bollman, MD;  Location: [facility] INVASIVE CV LAB;  Service: Cardiovascular;  Laterality: N/A;   CORONARY STENT PLACEMENT  11/2004   RIGHT/LEFT HEART CATH AND CORONARY ANGIOGRAPHY N/A 09/06/2019   Procedure: RIGHT/LEFT HEART CATH AND CORONARY ANGIOGRAPHY;  Surgeon: Tonny Bollman, MD;  Location: [facility] INVASIVE CV LAB;  Service: Cardiovascular;  Laterality:  N/A;    Social History Social History   Tobacco Use   Smoking status: Never   Smokeless tobacco: Never  Vaping Use   Vaping status: Never Used  Substance Use Topics   Alcohol use: No   Drug use: No    Family History Family History  Problem Relation Age of Onset   Heart disease Mother    Cancer Mother        ovarian   Arthritis Mother    Diabetes Mother    Hypertension Mother    Hypertension Father    Stroke Father    Diabetes Sister    Cancer Other    Coronary artery disease Other    Diabetes Other     Allergies  Allergen Reactions   Lyrica [Pregabalin] Other (See Comments)    sleepy     REVIEW OF SYSTEMS (Negative unless checked)  Constitutional: [] Weight loss  [] Fever  [] Chills Cardiac: [] Chest pain   [] Chest pressure   [] Palpitations   [] Shortness of breath when laying flat   [] Shortness of breath at rest   [] Shortness of breath with exertion. Vascular:  [x] Pain in legs with walking   [] Pain in legs at rest   [] Pain in legs when laying flat   [] Claudication   [] Pain in feet when walking  [] Pain in feet at rest  [] Pain in feet when laying flat   [] History of DVT   [] Phlebitis   [] Swelling in legs   [] Varicose veins   [] Non-healing ulcers Pulmonary:   [] Uses home oxygen   [] Productive cough   [] Hemoptysis   [] Wheeze  [] COPD   [] Asthma Neurologic:  [] Dizziness  [] Blackouts   [] Seizures   [] History of stroke   [] History of TIA  [] Aphasia   [] Temporary blindness   [] Dysphagia   [] Weakness or numbness in arms   [] Weakness or numbness in legs Musculoskeletal:  [x] Arthritis   [] Joint swelling   [] Joint pain   [] Low back pain Hematologic:  [] Easy bruising  [] Easy bleeding   [] Hypercoagulable state   [] Anemic  [] Hepatitis Gastrointestinal:  [] Blood in stool   [] Vomiting blood  [] Gastroesophageal reflux/heartburn   [] Difficulty swallowing. Genitourinary:  [] Chronic kidney disease   [] Difficult urination  [] Frequent urination  [] Burning with urination   [] Blood in  urine Skin:  [] Rashes   [] Ulcers   [] Wounds Psychological:  [] History of anxiety   []  History of major depression.  Physical Examination  Vitals:   07/31/23 0900 07/31/23 0915 07/31/23 0930 07/31/23 1037  BP: 120/63 (!) 121/53 (!) 124/50 (!) 125/52  Pulse: 65 64 63 65  Resp: 17 17 18 18   Temp:   98.3 F (36.8 C) 98.2 F (36.8 C)  TempSrc:      SpO2: 97% 97% 96% 100%  Weight:      Height:       Body mass index is 40.99 kg/m. Gen:  WD/WN, NAD Head: [place]/AT, No temporalis wasting. Prominent temp pulse not noted. Pulmonary:  Good air movement, respirations not labored, equal bilaterally.  Cardiac: RRR, normal S1, S2. Vascular:  Vessel Right Left  Radial Palpable Palpable  Ulnar    Brachial    Carotid Palpable, without bruit Palpable, without bruit  Aorta Not palpable N/A  Femoral Palpable Palpable  Popliteal    PT    DP  Surgical dressing in place, warm to ankle   Gastrointestinal: soft, non-tender/non-distended. No guarding/reflex.  Musculoskeletal: M/S 5/5 throughout.  LEFT foot dressing in place  No deformity or atrophy. No edema.      CBC Lab Results  Component Value Date   WBC 10.8 (H) 07/30/2023   HGB 11.5 (L) 07/30/2023   HCT 35.4 (L) 07/30/2023   MCV 90.1 07/30/2023   PLT 305 07/30/2023    BMET    Component Value Date/Time   NA 135 07/30/2023 0037   NA 138 09/29/2019 1001   K 4.1 07/30/2023 0037   CL 102 07/30/2023 0037   CO2 23 07/30/2023 0037   GLUCOSE 174 (H) 07/30/2023 0037   BUN 16 07/30/2023 0037   BUN 15 09/29/2019 1001   CREATININE 0.73 07/30/2023 0037   CREATININE 0.66 06/04/2023 1533   CALCIUM 8.9 07/30/2023 0037   GFRNONAA >60 07/30/2023 0037   GFRNONAA 93 06/17/2017 1644   GFRAA 106 09/29/2019 1001   GFRAA 108 06/17/2017 1644   Estimated Creatinine Clearance: 89.7 mL/min (by C-G formula based on SCr of 0.73 mg/dL).  COAG Lab Results  Component Value Date   INR 1.1 07/30/2023   INR 1.0 06/11/2021    Radiology DG Foot 2  Views Left  Result Date: 07/31/2023 CLINICAL DATA:  67 year old female status post amputation of the great toe. EXAM: LEFT FOOT - 2 VIEW COMPARISON:  07/29/2023. FINDINGS: Patient is status post amputation of the first digit beyond the first metatarsal. No acute displaced fracture or dislocation noted. No destructive bony lesions. Extensive degenerative changes of osteoarthritis are noted throughout the mid and hindfoot. Small plantar and dorsal calcaneal spurs are incidentally noted. Vascular calcifications are noted. IMPRESSION: 1. Status post great toe amputation without acute complicating features. Electronically Signed   By: Trudie Reed M.D.   On: 07/31/2023 12:44   US ARTERIAL ABI (SCREENING LOWER EXTREMITY)  Result Date: 07/30/2023 CLINICAL DATA:  Left first toe ulcer with osteomyelitis. History diabetes, hypertension and hyperlipidemia. EXAM: NONINVASIVE PHYSIOLOGIC VASCULAR STUDY OF BILATERAL LOWER EXTREMITIES TECHNIQUE: Evaluation of both lower extremities were performed at rest, including calculation of ankle-brachial indices with single level Doppler, pressure and pulse volume recording. COMPARISON:  None Available. FINDINGS: Right ABI: 0.74 based on posterior tibial artery pressure. The dorsalis pedis artery is noncompressible. Left ABI: 0.80 based on posterior tibial artery pressure. The dorsalis pedis artery is noncompressible. Right Lower Extremity: Monophasic posterior tibial and biphasic dorsalis pedis artery waveforms. Left Lower Extremity: Monophasic posterior tibial and dorsalis pedis waveforms. 0.5-0.79 Moderate PAD IMPRESSION: Mild to moderate bilateral ABI depression at rest of 0.74 on the  right and 0.80 on the left. Bilateral dorsalis pedis arteries are noncompressible, likely on the basis of vascular calcification. Electronically Signed   By: Irish Lack M.D.   On: 07/30/2023 17:02   MRI Left foot without contrast  Result Date: 07/30/2023 CLINICAL DATA:  Left great toe  infection. EXAM: MRI OF THE LEFT FOOT WITHOUT CONTRAST TECHNIQUE: Multiplanar, multisequence MR imaging of the left forefoot was performed. No intravenous contrast was administered. COMPARISON:  Left great toe x-rays from yesterday. FINDINGS: Bones/Joint/Cartilage Diffuse edema with corresponding decreased T1 marrow signal involving first distal phalanx. Mild marrow edema in the medial aspect of the first proximal phalanx head. No fracture or dislocation. Midfoot degenerative changes. No joint effusion. Ligaments Collateral ligaments are intact. Muscles and Tendons Flexor and extensor tendons are intact. No tenosynovitis. Increased T2 signal within and atrophy of the intrinsic muscles of the forefoot, nonspecific, but likely related to diabetic muscle changes. Soft tissue Diffuse soft tissue swelling. Plantar great toe ulceration. No fluid collection. No soft tissue mass. IMPRESSION: 1. Plantar great toe ulceration with osteomyelitis of the first distal phalanx. 2. Mild marrow edema in the medial aspect of the first proximal phalanx head is nonspecific, but could reflect early osteomyelitis. 3. No abscess. Electronically Signed   By: Obie Dredge M.D.   On: 07/30/2023 10:48   DG Toe Great Left  Result Date: 07/29/2023 CLINICAL DATA:  Wound check on left great toe. Diabetic ulcer and debridement done [facility] yesterday. EXAM: LEFT GREAT TOE COMPARISON:  05/03/2015 FINDINGS: No acute fracture or dislocation. No radiographic evidence of osteomyelitis. Ulceration about the plantar aspect of the first toe. IMPRESSION: 1. No radiographic evidence of osteomyelitis. 2. Ulceration about the plantar aspect of the first toe. Electronically Signed   By: Minerva Fester M.D.   On: 07/29/2023 20:41   DG Chest 2 View  Result Date: 07/29/2023 CLINICAL DATA:  Provided history: Infection. EXAM: CHEST - 2 VIEW COMPARISON:  Prior chest radiographs 04/11/2018 and earlier. FINDINGS: Heart size within normal limits.  Aortic atherosclerosis. Small bandlike opacity within the lateral left lung base most consistent with atelectasis. No appreciable airspace consolidation on the right. No evidence of pleural effusion or pneumothorax. No acute osseous abnormality identified. Degenerative changes of the spine. IMPRESSION: 1. Small bandlike opacity within the lateral left lung base most consistent with atelectasis. 2. Aortic Atherosclerosis (ICD10-I70.0). Electronically Signed   By: Jackey Loge D.O.   On: 07/29/2023 17:38      Assessment/Plan 1. LEFT great toe diabetic foot wound/Osteomyelitis 2. Will plan for angiogram with possible intervention on Monday 3. Discussed with patient at length, all questions answered, understanding expressed.   Bertram Denver, MD  07/31/2023 2:57 PM    This note was created with Dragon medical transcription system.  Any error is purely unintentional

## 2023-07-31 NOTE — Progress Notes (Signed)
Pharmacy Antibiotic Note  Betty [name] is a 67 y.o. female admitted on 07/29/2023 with diabetic foot ulcer.  Pharmacy has been consulted for Vancomycin dosing.  Plan: Pt was on vancomycin 1750 mg daily. Will adjust to vancomycin 1000 mg q12H for a predicted AUC of 461. Plan to get vancomycin levels after 4th or 5th dose.   Continue ceftriaxone 2 mg daily,   Continue flagyl 500 mg BID.   Pharmacy will continue to follow and will adjust abx dosing whenever warranted.  Temp (24hrs), Avg:97.9 F (36.6 C), Min:97.6 F (36.4 C), Max:98.3 F (36.8 C)   Recent Labs  Lab 07/29/23 1613 07/30/23 0037  WBC 14.3* 10.8*  CREATININE 0.69 0.73  LATICACIDVEN 1.0  --     Estimated Creatinine Clearance: 89.7 mL/min (by C-G formula based on SCr of 0.73 mg/dL).    Allergies  Allergen Reactions   Lyrica [Pregabalin] Other (See Comments)    sleepy    Antimicrobials this admission: 10/18 Ceftriaxone >>  10/18 Flagyl >>  10/18 Vancomycin >>   Microbiology results: 10/18 BCx: Pending  Thank you for allowing pharmacy to be a part of this patient's care.   Paschal Dopp, PharmD, BCPS 07/31/2023 11:22 AM

## 2023-07-31 NOTE — Plan of Care (Signed)
Problem: Education: Goal: Knowledge of General Education information will improve Description: Including pain rating scale, medication(s)/side effects and non-pharmacologic comfort measures 07/31/2023 1630 by Oneita Kras, RN Outcome: Progressing 07/31/2023 1625 by Oneita Kras, RN Outcome: Progressing   Problem: Health Behavior/Discharge Planning: Goal: Ability to manage health-related needs will improve 07/31/2023 1630 by Oneita Kras, RN Outcome: Progressing 07/31/2023 1625 by Oneita Kras, RN Outcome: Progressing   Problem: Clinical Measurements: Goal: Ability to maintain clinical measurements within normal limits will improve 07/31/2023 1630 by Oneita Kras, RN Outcome: Progressing 07/31/2023 1625 by Oneita Kras, RN Outcome: Progressing Goal: Will remain free from infection 07/31/2023 1630 by Oneita Kras, RN Outcome: Progressing 07/31/2023 1625 by Oneita Kras, RN Outcome: Progressing Goal: Diagnostic test results will improve 07/31/2023 1630 by Oneita Kras, RN Outcome: Progressing 07/31/2023 1625 by Oneita Kras, RN Outcome: Progressing Goal: Respiratory complications will improve 07/31/2023 1630 by Oneita Kras, RN Outcome: Progressing 07/31/2023 1625 by Oneita Kras, RN Outcome: Progressing Goal: Cardiovascular complication will be avoided 07/31/2023 1630 by Oneita Kras, RN Outcome: Progressing 07/31/2023 1625 by Oneita Kras, RN Outcome: Progressing   Problem: Activity: Goal: Risk for activity intolerance will decrease 07/31/2023 1630 by Oneita Kras, RN Outcome: Progressing 07/31/2023 1625 by Oneita Kras, RN Outcome: Progressing   Problem: Nutrition: Goal: Adequate nutrition will be maintained 07/31/2023 1630 by Oneita Kras, RN Outcome: Progressing 07/31/2023 1625 by Oneita Kras,  RN Outcome: Progressing   Problem: Coping: Goal: Level of anxiety will decrease 07/31/2023 1630 by Oneita Kras, RN Outcome: Progressing 07/31/2023 1625 by Oneita Kras, RN Outcome: Progressing   Problem: Elimination: Goal: Will not experience complications related to bowel motility 07/31/2023 1630 by Oneita Kras, RN Outcome: Progressing 07/31/2023 1625 by Oneita Kras, RN Outcome: Progressing Goal: Will not experience complications related to urinary retention 07/31/2023 1630 by Oneita Kras, RN Outcome: Progressing 07/31/2023 1625 by Oneita Kras, RN Outcome: Progressing   Problem: Pain Managment: Goal: General experience of comfort will improve 07/31/2023 1630 by Oneita Kras, RN Outcome: Progressing 07/31/2023 1625 by Oneita Kras, RN Outcome: Progressing   Problem: Safety: Goal: Ability to remain free from injury will improve 07/31/2023 1630 by Oneita Kras, RN Outcome: Progressing 07/31/2023 1625 by Oneita Kras, RN Outcome: Progressing   Problem: Skin Integrity: Goal: Risk for impaired skin integrity will decrease 07/31/2023 1630 by Oneita Kras, RN Outcome: Progressing 07/31/2023 1625 by Oneita Kras, RN Outcome: Progressing   Problem: Education: Goal: Ability to describe self-care measures that may prevent or decrease complications (Diabetes Survival Skills Education) will improve 07/31/2023 1630 by Oneita Kras, RN Outcome: Progressing 07/31/2023 1625 by Oneita Kras, RN Outcome: Progressing Goal: Individualized Educational Video(s) 07/31/2023 1630 by Oneita Kras, RN Outcome: Progressing 07/31/2023 1625 by Oneita Kras, RN Outcome: Progressing   Problem: Coping: Goal: Ability to adjust to condition or change in health will improve 07/31/2023 1630 by Oneita Kras, RN Outcome:  Progressing 07/31/2023 1625 by Oneita Kras, RN Outcome: Progressing   Problem: Fluid Volume: Goal: Ability to maintain a balanced intake and output will improve 07/31/2023 1630 by Oneita Kras, RN Outcome: Progressing 07/31/2023 1625 by Oneita Kras, RN Outcome: Progressing   Problem: Health Behavior/Discharge Planning: Goal: Ability to identify and utilize available resources and services will improve 07/31/2023 1630 by Oneita Kras, RN Outcome: Progressing 07/31/2023 1625 by Oneita Kras,  RN Outcome: Progressing Goal: Ability to manage health-related needs will improve 07/31/2023 1630 by Oneita Kras, RN Outcome: Progressing 07/31/2023 1625 by Oneita Kras, RN Outcome: Progressing   Problem: Metabolic: Goal: Ability to maintain appropriate glucose levels will improve 07/31/2023 1630 by Oneita Kras, RN Outcome: Progressing 07/31/2023 1625 by Oneita Kras, RN Outcome: Progressing   Problem: Nutritional: Goal: Maintenance of adequate nutrition will improve 07/31/2023 1630 by Oneita Kras, RN Outcome: Progressing 07/31/2023 1625 by Oneita Kras, RN Outcome: Progressing Goal: Progress toward achieving an optimal weight will improve 07/31/2023 1630 by Oneita Kras, RN Outcome: Progressing 07/31/2023 1625 by Oneita Kras, RN Outcome: Progressing   Problem: Skin Integrity: Goal: Risk for impaired skin integrity will decrease 07/31/2023 1630 by Oneita Kras, RN Outcome: Progressing 07/31/2023 1625 by Oneita Kras, RN Outcome: Progressing   Problem: Tissue Perfusion: Goal: Adequacy of tissue perfusion will improve 07/31/2023 1630 by Oneita Kras, RN Outcome: Progressing 07/31/2023 1625 by Oneita Kras, RN Outcome: Progressing

## 2023-08-01 ENCOUNTER — Encounter: Payer: Self-pay | Admitting: Podiatry

## 2023-08-01 DIAGNOSIS — M869 Osteomyelitis, unspecified: Secondary | ICD-10-CM

## 2023-08-01 DIAGNOSIS — M86172 Other acute osteomyelitis, left ankle and foot: Secondary | ICD-10-CM | POA: Diagnosis not present

## 2023-08-01 LAB — CBC
HCT: 32.9 % — ABNORMAL LOW (ref 36.0–46.0)
Hemoglobin: 11.2 g/dL — ABNORMAL LOW (ref 12.0–15.0)
MCH: 29.5 pg (ref 26.0–34.0)
MCHC: 34 g/dL (ref 30.0–36.0)
MCV: 86.6 fL (ref 80.0–100.0)
Platelets: 304 10*3/uL (ref 150–400)
RBC: 3.8 MIL/uL — ABNORMAL LOW (ref 3.87–5.11)
RDW: 12.3 % (ref 11.5–15.5)
WBC: 10.1 10*3/uL (ref 4.0–10.5)
nRBC: 0 % (ref 0.0–0.2)

## 2023-08-01 LAB — GLUCOSE, CAPILLARY
Glucose-Capillary: 196 mg/dL — ABNORMAL HIGH (ref 70–99)
Glucose-Capillary: 175 mg/dL — ABNORMAL HIGH (ref 70–99)
Glucose-Capillary: 274 mg/dL — ABNORMAL HIGH (ref 70–99)
Glucose-Capillary: 276 mg/dL — ABNORMAL HIGH (ref 70–99)

## 2023-08-01 LAB — C-REACTIVE PROTEIN: CRP: 1.7 mg/dL — ABNORMAL HIGH (ref <1.0)

## 2023-08-01 MED ORDER — BIOTENE DRY MOUTH MT LIQD: 15.0000 mL | OROMUCOSAL | Status: DC | PRN

## 2023-08-01 MED ORDER — DICLOFENAC SODIUM 1 % EX GEL
2.0000 g | Freq: Four times a day (QID) | CUTANEOUS | Status: DC
  Administered 2023-08-01 – 2023-08-03 (×8): 2 g via TOPICAL
  Filled 2023-08-01: qty 100

## 2023-08-01 NOTE — Evaluation (Addendum)
Physical Therapy Evaluation Patient Details Name: Betty [name] MRN: [id number] DOB: [birth year] Today's Date: 08/01/2023  History of Present Illness  Betty [name] is a 67yoF who comes to [facility] on 07/29/23 after recent podiatry debridement of 36mo hallux ulcer, podiatry concerned for progression to osteo, recommended ABI, MRI. PMH: DM c diabetic peripheral neuropathy, HTN, HLD, CAD with DES, morbid obesity, OSA not on CPAP, vertigo. Pt went for Left hallux disarticulation at [facility] on 10/19, orders for WBAT in surgical shoe although limited.  Clinical Impression  Pt in bed on entry, niece and son at bedside. Pt educated on WBAT in surgical show precautions. Pt performs all functional mobiilty at baseline level, no physical assist needed. AMB is kept at a minimum within typical post operative guidelines. Awaiting vascular consult to learn of any intervention. Pt encouraged to sit up to chair for >/= 45 minutes in the name of pulmonary toilet, thrombosis risk reduction. Pt has a 4WW at home that stays in the BR for tub transfers, may benefit from RW for household mobility and MD FU for additional layer of foot off loading, will defer to OT assessment to determine best recommendation for tub seat v 3 in 1 for future ADL performance. Will continue to follow.       If plan is discharge home, recommend the following: A little help with walking and/or transfers;A little help with bathing/dressing/bathroom;Assist for transportation;Assistance with cooking/housework;Help with stairs or ramp for entrance   Can travel by private vehicle        Equipment Recommendations Rolling walker (2 wheels)  Recommendations for Other Services       Functional Status Assessment Patient has had a recent decline in their functional status and demonstrates the ability to make significant improvements in function in a reasonable and predictable amount of time.     Precautions / Restrictions  Precautions Precautions: Fall Restrictions Weight Bearing Restrictions: Yes LLE Weight Bearing: Weight bearing as tolerated (in surgical shoe but limited)      Mobility  Bed Mobility Overal bed mobility: Modified Independent                  Transfers Overall transfer level: Modified independent Equipment used: Rolling walker (2 wheels)               General transfer comment: walker offered as a sign of assistance, not needed necessarily, but would help offload foot even more in theory.    Ambulation/Gait Ambulation/Gait assistance: Supervision Gait Distance (Feet): 20 Feet Assistive device: Rolling walker (2 wheels) Gait Pattern/deviations: WFL(Within Functional Limits)       General Gait Details: distance limited by author given post surgical recommendations, avoiding unnecessary weightbearing.  Stairs            Wheelchair Mobility     Tilt Bed    Modified Rankin (Stroke Patients Only)       Balance Overall balance assessment: Modified Independent, Mild deficits observed, not formally tested (per pt report)                                           Pertinent Vitals/Pain Pain Assessment Pain Assessment: No/denies pain (had some pain last night, but meds seem to be helping) Pain Score: 0-No pain    Home Living Family/patient expects to be discharged to:: Private residence Living Arrangements: Alone Available Help at Discharge: Family (family in town  x2 wejs, otherwise, does not have local assistance) Type of Home: House Home Access: Stairs to enter Entrance Stairs-Rails: Lawyer of Steps: 3   Home Layout: One level Home Equipment: Rollator (4 wheels);Cane - single point Additional Comments: Son lives in [place], is visiting for 2 weeks to assist; niece is also visiting from [place]; has a shower bench that does not fit in her shower.    Prior Function Prior Level of Function : Independent/Modified  Independent (stopped driving in August due to new cataracts appoints; needs hep figuring out transport.)             Mobility Comments: has been WBAT, regular shoes prior to surgery, not in a position to stay off feet due to living alone; stopped driving end of August with new diagnosis of cataracts; ADLs Comments: modI baseline, wears poise pads due to urge incontinence at baseline.     Extremity/Trunk Assessment                Communication      Cognition Arousal: Alert Behavior During Therapy: WFL for tasks assessed/performed Overall Cognitive Status: Within Functional Limits for tasks assessed                                          General Comments      Exercises Other Exercises Other Exercises: seated LAQ 1x15 bilat, seated marching 1x10 bilat (OA management)   Assessment/Plan    PT Assessment Patient needs continued PT services  PT Problem List Decreased range of motion;Decreased activity tolerance;Decreased balance;Decreased mobility;Decreased coordination       PT Treatment Interventions DME instruction;Gait training;Stair training;Functional mobility training;Therapeutic activities;Balance training;Therapeutic exercise;Patient/family education    PT Goals (Current goals can be found in the Care Plan section)  Acute Rehab PT Goals Patient Stated Goal: learn new mobility modifications, promote wound healing PT Goal Formulation: With patient Time For Goal Achievement: 08/15/23 Potential to Achieve Goals: Good    Frequency Min 1X/week     Co-evaluation               AM-PAC PT "6 Clicks" Mobility  Outcome Measure Help needed turning from your back to your side while in a flat bed without using bedrails?: None Help needed moving from lying on your back to sitting on the side of a flat bed without using bedrails?: None Help needed moving to and from a bed to a chair (including a wheelchair)?: A Little Help needed standing up  from a chair using your arms (e.g., wheelchair or bedside chair)?: A Little Help needed to walk in hospital room?: A Little Help needed climbing 3-5 steps with a railing? : A Little 6 Click Score: 20    End of Session   Activity Tolerance: Patient tolerated treatment well;No increased pain Patient left: in chair;with call bell/phone within reach;with family/visitor present Nurse Communication: Mobility status PT Visit Diagnosis: Difficulty in walking, not elsewhere classified (R26.2);Other abnormalities of gait and mobility (R26.89);Muscle weakness (generalized) (M62.81)    Time: 0981-1914 PT Time Calculation (min) (ACUTE ONLY): 45 min   Charges:   PT Evaluation $PT Eval Moderate Complexity: 1 Mod PT Treatments $Therapeutic Activity: 8-22 mins $Self Care/Home Management: 8-22 PT General Charges $$ ACUTE PT VISIT: 1 Visit        2:01 PM, 08/01/23 Rosamaria Lints, PT, DPT Physical Therapist - [facility]  [phone] (ASCOM)    Kyleigh Nannini C 08/01/2023, 1:57 PM

## 2023-08-01 NOTE — Progress Notes (Signed)
PODIATRY PROGRESS NOTE  NAME Betty [name] MRN [id number] DOB 1956/08/05 DOA 07/29/2023   Reason for consult:  Chief Complaint  Patient presents with   Wound Check     History of present illness: 67 y.o. female POD # 1 s/p left hallux amputation.  Patient was having some pain overnight which has been medication progress with Toqeer, denies any fevers or chills.  Denies any current pain to her foot.  She describes more arthritis pain in her knees and other areas.  Vitals:   08/01/23 0414 08/01/23 0818  BP: (!) 128/31 (!) 135/54  Pulse: 72 61  Resp: 16 18  Temp: 98.2 F (36.8 C) 97.9 F (36.6 C)  SpO2: 95% 98%       Latest Ref Rng & Units 08/01/2023    4:40 AM 07/30/2023   12:37 AM 07/29/2023    4:13 PM  CBC  WBC 4.0 - 10.5 K/uL 10.1  10.8  14.3   Hemoglobin 12.0 - 15.0 g/dL 44.0  34.7  42.5   Hematocrit 36.0 - 46.0 % 32.9  35.4  38.2   Platelets 150 - 400 K/uL 304  305  343        Latest Ref Rng & Units 07/30/2023   12:37 AM 07/29/2023    4:13 PM 06/04/2023    3:33 PM  BMP  Glucose 70 - 99 mg/dL 956  71  387   BUN 8 - 23 mg/dL 16  17  14    Creatinine 0.44 - 1.00 mg/dL 5.64  3.32  9.51   BUN/Creat Ratio 6 - 22 (calc)   SEE NOTE:   Sodium 135 - 145 mmol/L 135  136  139   Potassium 3.5 - 5.1 mmol/L 4.1  3.8  4.1   Chloride 98 - 111 mmol/L 102  104  104   CO2 22 - 32 mmol/L 23  23  25    Calcium 8.9 - 10.3 mg/dL 8.9  9.1  9.8       Physical Exam: General: AAOx3, NAD, laying in bed, son at bedside  Dermatology: Patient got up with sutures intact.  There is still some erythema proximal incision proximal to the incision.  There is no purulence.  There are some mild bloody drainage on the bandage.  No active bleeding noted.  There is no malodor.  No fluctuance or crepitation.       Vascular: Foot appears warm and perfused  Neurological: Decreased  Musculoskeletal Exam: There is no significant pain on exam today.     ASSESSMENT/PLAN OF  CARE   67 year old female postop day 1 status post hallux amputation  -Patient reassured.  Xeroform is applied followed by [facility] -Continue vancomycin, ceftriaxone, metronidazole -WBAT in surgical shoe, although limited -Elevation -Appreciate vascular consult- plan for angio.  -Podiatry will continue to follow for now. Will likely see her Tuesday. If anything changes in the meantime, please let us know.    Please contact me directly with any questions or concerns.     Betty [name], DPM [facility]  Dr. Lesia Sago. Yuleni Burich, DPM    [address], [place] [address]                Office [phone]  Fax (  [phone]     

## 2023-08-01 NOTE — Progress Notes (Signed)
At or around 1700-1715, while coming out of a room assisting with patient care and giving meds, this nurse was made aware of patient needing to go to the bath room by the Unit Sec and the nurse tech was with another patient. Upon entering the room, this nurse was met with aggression from patient's niece and patient stating they have been waiting about 30 minutes for someone to assist her with the [facility]. I made the patient and niece aware that the nurse tech was with another patient and I was with another person as well. From there it was rudeness and aggression from the niece. I informed her that we were extremely busy and apologized numerous of times, but that niece continued with aggression and started to become abrasive. While talking with the patient, she stated that "I didn't have to be condescending with her  (the patient)" and I stated I'm just simply stating that I wasn't being rude I was stating what happened and was here to help now. I was also told that the neighbor stated that she over heard myself and staff talking in the hallway and she stated she heard me say " All the time" I explained to her that I was talking with another staff member who was making jokes about how fast I walked and I stated " All the time " I let her know that had nothing to do with the patient . By that time Charge nurse entered the room and the tone of aggression was already set and the niece refused to speak while I was in the room. Charge stated that she would speak with niece and patient about what was going on. This nurse left the room and Charge Nurse took over care for this patient for the rest of the shift.

## 2023-08-01 NOTE — Progress Notes (Signed)
Progress Note   Patient: Betty [name] WFU:[id number] DOB: 12-Feb-1956 DOA: 07/29/2023     3 DOS: the patient was seen and examined on 08/01/2023   Brief hospital course: HPI on admission 07/29/23: "Betty [name] is a 67 y.o. female with medical history significant of DM, HTN, HLD, CAD with DES, morbid obesity, OSA not on CPAP, vertigo, peripheral neuropathy who presents with left great toe ulcer.   Patient states that she has chronic left great toe ulcer for more than 10 months.  She has been following up with Dr. Lilian Kapur of podiatry. Pt had debridement done at wound clinic yesterday. Per Dr. Vara Guardian clinic note, patient has worsening foot ulcer with possible osteomyelitis. Dr. Lilian Kapur recommended patient to come to the hospital to get MIR and ABI..."  Patient admitted to hospital with Podiatry consulted.  MRI of the foot showed osteomyelitis.  Pt underwennt L great toe amputation 10/19.  Vascular surgery consulted with plan for angiogram 10/21 to assess for PAD.  Assessment and Plan: * Acute osteomyelitis of toe, left [facility]) Podiatry consulted. Now s/p L great toe amputation 10/19 Follow surgical path / cultures Continue broad spectrum IV antibiotics - Vanc, Rocephin, Flagyl for now Vascular surgery consult for evaluation - angiogram and potential revascularization tomorrow 10/21. NPO after midnight  Diabetic foot ulcer_left great toe See osteomyelitis  Hypoglycemia Resolved.  CBG's evening of 10/17 were 53 >> 44 >> 59 >> 167 --Hypoglycemia protocol --Adjusted insulin regimen accordingly and per diabetes coordinator recs  Poorly controlled type 2 diabetes mellitus with circulatory disorder (HCC) Recent A1c 7.8, poorly controlled.  Patient is taking Novolin, Ozempic, metformin, NPH insulin --Hold home medications --Sliding scale insulin TID AC (stopped HS sliding scale to prevent recurrent hypoglycemia)  CAD, NATIVE VESSEL Stable. No CP. --Continue  aspirin, Crestor --Hold Plavix for surgery & resume when okay with podiatry  HYPERTENSION, BENIGN --IV hydralazine as needed --Coreg, ramipril  Hyperlipidemia --Crestor  Morbid obesity with BMI of 40.0-44.9, adult (HCC) Body mass index is 40.99 kg/m. Complicates overall care and prognosis.  Recommend lifestyle modifications including physical activity and diet for weight loss and overall long-term health.  Osteomyelitis of great toe of left foot (HCC) .        Subjective: Pt seen awake sitting up in bed, son at bedside today. She reports some pain in her knee, has arthritis, more painful today.  Reports dry mouth.  Discusses angiogram procedure tomorrow.   Physical Exam: Vitals:   08/01/23 0414 08/01/23 0818 08/01/23 1640 08/01/23 1918  BP: (!) 128/31 (!) 135/54 (!) 153/48 (!) 140/45  Pulse: 72 61 69 71  Resp: 16 18 16 16   Temp: 98.2 F (36.8 C) 97.9 F (36.6 C) 97.8 F (36.6 C) 98.3 F (36.8 C)  TempSrc:    Oral  SpO2: 95% 98% 98% 98%  Weight:      Height:       General exam: awake, alert, no acute distress, obese HEENT: moist mucus membranes, hearing grossly normal  Respiratory system: on room air, normal respiratory effort. Cardiovascular system: normal S1/S2, RRR, no pedal edema.   Gastrointestinal system: soft, NT, ND Central nervous system: A&O x 3. no gross focal neurologic deficits, normal speech Extremities: left foot dressed and ace wrapped Skin: dry, intact, normal temperature Psychiatry: normal mood, congruent affect, judgement and insight appear normal   Data Reviewed:  Notable labs -- Hbg 11.2 stable.  CBG's 167 >> 196 >> 175 >> 274  Consults - Podiatry, Vascular Surgery  Family Communication:  none present. Pt able to update.  Disposition: Status is: Inpatient Remains inpatient appropriate because: remains on IV antibiotics pending cultures / surgical path.  Vascular evaluation planned.   Planned Discharge Destination: Home with Home  Health    Time spent: 38 minutes  Author: Pennie Banter, DO 08/01/2023 7:42 PM  For on call review [email or web address].

## 2023-08-01 NOTE — Plan of Care (Signed)

## 2023-08-02 ENCOUNTER — Encounter: Payer: Self-pay | Admitting: Vascular Surgery

## 2023-08-02 ENCOUNTER — Encounter
Admission: EM | Disposition: A | Discharge status: Home Health | Payer: Self-pay | Source: Home / Self Care | Attending: Internal Medicine

## 2023-08-02 DIAGNOSIS — I70262 Atherosclerosis of native arteries of extremities with gangrene, left leg: Secondary | ICD-10-CM | POA: Diagnosis not present

## 2023-08-02 DIAGNOSIS — M86172 Other acute osteomyelitis, left ankle and foot: Secondary | ICD-10-CM | POA: Diagnosis not present

## 2023-08-02 HISTORY — PX: LOWER EXTREMITY ANGIOGRAPHY: CATH118251

## 2023-08-02 LAB — GLUCOSE, CAPILLARY
Glucose-Capillary: 170 mg/dL — ABNORMAL HIGH (ref 70–99)
Glucose-Capillary: 287 mg/dL — ABNORMAL HIGH (ref 70–99)
Glucose-Capillary: 226 mg/dL — ABNORMAL HIGH (ref 70–99)
Glucose-Capillary: 205 mg/dL — ABNORMAL HIGH (ref 70–99)
Glucose-Capillary: 202 mg/dL — ABNORMAL HIGH (ref 70–99)
Glucose-Capillary: 181 mg/dL — ABNORMAL HIGH (ref 70–99)
Glucose-Capillary: 258 mg/dL — ABNORMAL HIGH (ref 70–99)

## 2023-08-02 LAB — RENAL FUNCTION PANEL
Albumin: 3.4 g/dL — ABNORMAL LOW (ref 3.5–5.0)
Anion gap: 8 (ref 5–15)
BUN: 14 mg/dL (ref 8–23)
CO2: 23 mmol/L (ref 22–32)
Calcium: 8.8 mg/dL — ABNORMAL LOW (ref 8.9–10.3)
Chloride: 103 mmol/L (ref 98–111)
Creatinine, Ser: 0.67 mg/dL (ref 0.44–1.00)
GFR, Estimated: >60 mL/min (ref >60)
Glucose, Bld: 292 mg/dL — ABNORMAL HIGH (ref 70–99)
Phosphorus: 2.7 mg/dL (ref 2.5–4.6)
Potassium: 4 mmol/L (ref 3.5–5.1)
Sodium: 134 mmol/L — ABNORMAL LOW (ref 135–145)

## 2023-08-02 LAB — CBC
HCT: 34.3 % — ABNORMAL LOW (ref 36.0–46.0)
Hemoglobin: 11.5 g/dL — ABNORMAL LOW (ref 12.0–15.0)
MCH: 28.8 pg (ref 26.0–34.0)
MCHC: 33.5 g/dL (ref 30.0–36.0)
MCV: 85.8 fL (ref 80.0–100.0)
Platelets: 294 10*3/uL (ref 150–400)
RBC: 4 MIL/uL (ref 3.87–5.11)
RDW: 12.2 % (ref 11.5–15.5)
WBC: 9 10*3/uL (ref 4.0–10.5)
nRBC: 0 % (ref 0.0–0.2)

## 2023-08-02 SURGERY — LOWER EXTREMITY ANGIOGRAPHY
Anesthesia: Moderate Sedation | Laterality: Left

## 2023-08-02 MED ORDER — INSULIN GLARGINE-YFGN 100 UNIT/ML ~~LOC~~ SOLN
8.0000 [IU] | Freq: Every day | SUBCUTANEOUS | Status: DC
  Administered 2023-08-02: 8 [IU] via SUBCUTANEOUS
  Filled 2023-08-02 (×2): qty 0.08

## 2023-08-02 MED ORDER — HEPARIN SODIUM (PORCINE) 1000 UNIT/ML IJ SOLN
INTRAMUSCULAR | Status: AC
  Filled 2023-08-02: qty 10

## 2023-08-02 MED ORDER — HYDROCODONE-ACETAMINOPHEN 5-325 MG PO TABS: 1.0000 | ORAL_TABLET | Freq: Four times a day (QID) | ORAL | Status: DC | PRN

## 2023-08-02 MED ORDER — INSULIN ASPART 100 UNIT/ML IJ SOLN
0.0000 [IU] | Freq: Three times a day (TID) | INTRAMUSCULAR | Status: DC
  Administered 2023-08-02: 2 [IU] via SUBCUTANEOUS
  Administered 2023-08-03 (×2): 5 [IU] via SUBCUTANEOUS
  Administered 2023-08-03: 3 [IU] via SUBCUTANEOUS
  Filled 2023-08-02 (×4): qty 1

## 2023-08-02 MED ORDER — LIDOCAINE-EPINEPHRINE (PF) 1 %-1:200000 IJ SOLN
INTRAMUSCULAR | Status: DC | PRN
  Administered 2023-08-02: 10 mL via INTRADERMAL

## 2023-08-02 MED ORDER — FENTANYL CITRATE (PF) 100 MCG/2ML IJ SOLN
INTRAMUSCULAR | Status: AC
  Filled 2023-08-02: qty 2

## 2023-08-02 MED ORDER — IODIXANOL 320 MG/ML IV SOLN
INTRAVENOUS | Status: DC | PRN
  Administered 2023-08-02: 20 mL via INTRA_ARTERIAL

## 2023-08-02 MED ORDER — VANCOMYCIN HCL 1750 MG/350ML IV SOLN
1750.0000 mg | INTRAVENOUS | Status: DC
  Administered 2023-08-02: 1750 mg via INTRAVENOUS
  Filled 2023-08-02 (×2): qty 350

## 2023-08-02 MED ORDER — SODIUM CHLORIDE 0.9 % IV SOLN: INTRAVENOUS | Status: AC

## 2023-08-02 MED ORDER — FENTANYL CITRATE (PF) 100 MCG/2ML IJ SOLN
INTRAMUSCULAR | Status: DC | PRN
  Administered 2023-08-02: 50 ug via INTRAVENOUS

## 2023-08-02 MED ORDER — INSULIN ASPART 100 UNIT/ML IJ SOLN
0.0000 [IU] | Freq: Every day | INTRAMUSCULAR | Status: DC
  Administered 2023-08-02: 3 [IU] via SUBCUTANEOUS
  Filled 2023-08-02: qty 1

## 2023-08-02 MED ORDER — HEPARIN (PORCINE) IN NACL 1000-0.9 UT/500ML-% IV SOLN
INTRAVENOUS | Status: DC | PRN
  Administered 2023-08-02: 1000 mL

## 2023-08-02 MED ORDER — MIDAZOLAM HCL 2 MG/2ML IJ SOLN
INTRAMUSCULAR | Status: DC | PRN
  Administered 2023-08-02: 1 mg via INTRAVENOUS

## 2023-08-02 MED ORDER — MIDAZOLAM HCL 5 MG/5ML IJ SOLN
INTRAMUSCULAR | Status: AC
  Filled 2023-08-02: qty 5

## 2023-08-02 SURGICAL SUPPLY — 9 items
CATH ANGIO 5F PIGTAIL 65CM (CATHETERS) IMPLANT
COVER PROBE ULTRASOUND 5X96 (MISCELLANEOUS) IMPLANT
DEVICE STARCLOSE SE CLOSURE (Vascular Products) IMPLANT
PACK ANGIOGRAPHY (CUSTOM PROCEDURE TRAY) ×2 IMPLANT
PANNUS RETENTION SYSTEM 2 PAD (MISCELLANEOUS) IMPLANT
SHEATH BRITE TIP 5FRX11 (SHEATH) IMPLANT
SYR MEDRAD MARK 7 150ML (SYRINGE) IMPLANT
TUBING CONTRAST HIGH PRESS 72 (TUBING) IMPLANT
WIRE GUIDERIGHT .035X150 (WIRE) IMPLANT

## 2023-08-02 NOTE — Interval H&P Note (Signed)
History and Physical Interval Note:  08/02/2023 1:54 PM  Betty [name]  has presented today for surgery, with the diagnosis of Osteomyelitis LEFt great toe- non healing.  The various methods of treatment have been discussed with the patient and family. After consideration of risks, benefits and other options for treatment, the patient has consented to  Procedure(s): Lower Extremity Angiography (Left) as a surgical intervention.  The patient's history has been reviewed, patient examined, no change in status, stable for surgery.  I have reviewed the patient's chart and labs.  Questions were answered to the patient's satisfaction.     Festus Barren

## 2023-08-02 NOTE — Plan of Care (Signed)
Tylenol given for pain.  Post op shoe on when up to bathroom. NPO since midnight for procedure today.  Problem: Education: Goal: Knowledge of General Education information will improve Description: Including pain rating scale, medication(s)/side effects and non-pharmacologic comfort measures Outcome: Progressing   Problem: Health Behavior/Discharge Planning: Goal: Ability to manage health-related needs will improve Outcome: Progressing   Problem: Clinical Measurements: Goal: Ability to maintain clinical measurements within normal limits will improve Outcome: Progressing Goal: Will remain free from infection Outcome: Progressing Goal: Diagnostic test results will improve Outcome: Progressing Goal: Respiratory complications will improve Outcome: Progressing Goal: Cardiovascular complication will be avoided Outcome: Progressing   Problem: Activity: Goal: Risk for activity intolerance will decrease Outcome: Progressing   Problem: Nutrition: Goal: Adequate nutrition will be maintained Outcome: Progressing   Problem: Coping: Goal: Level of anxiety will decrease Outcome: Progressing   Problem: Elimination: Goal: Will not experience complications related to bowel motility Outcome: Progressing Goal: Will not experience complications related to urinary retention Outcome: Progressing   Problem: Pain Managment: Goal: General experience of comfort will improve Outcome: Progressing   Problem: Safety: Goal: Ability to remain free from injury will improve Outcome: Progressing   Problem: Skin Integrity: Goal: Risk for impaired skin integrity will decrease Outcome: Progressing   Problem: Education: Goal: Ability to describe self-care measures that may prevent or decrease complications (Diabetes Survival Skills Education) will improve Outcome: Progressing Goal: Individualized Educational Video(s) Outcome: Progressing   Problem: Coping: Goal: Ability to adjust to condition or  change in health will improve Outcome: Progressing   Problem: Fluid Volume: Goal: Ability to maintain a balanced intake and output will improve Outcome: Progressing   Problem: Health Behavior/Discharge Planning: Goal: Ability to identify and utilize available resources and services will improve Outcome: Progressing Goal: Ability to manage health-related needs will improve Outcome: Progressing   Problem: Metabolic: Goal: Ability to maintain appropriate glucose levels will improve Outcome: Progressing   Problem: Nutritional: Goal: Maintenance of adequate nutrition will improve Outcome: Progressing Goal: Progress toward achieving an optimal weight will improve Outcome: Progressing   Problem: Skin Integrity: Goal: Risk for impaired skin integrity will decrease Outcome: Progressing   Problem: Tissue Perfusion: Goal: Adequacy of tissue perfusion will improve Outcome: Progressing

## 2023-08-02 NOTE — Inpatient Diabetes Management (Signed)
Inpatient Diabetes Program Recommendations  AACE/ADA: New Consensus Statement on Inpatient Glycemic Control   Target Ranges:  Prepandial:   less than 140 mg/dL      Peak postprandial:   less than 180 mg/dL (1-2 hours)      Critically ill patients:  140 - 180 mg/dL    Latest Reference Range & Units 08/02/23 04:51  Glucose 70 - 99 mg/dL 409 (H)    Latest Reference Range & Units 08/01/23 08:16 08/01/23 11:45 08/01/23 17:23 08/01/23 19:38  Glucose-Capillary 70 - 99 mg/dL 811 (H) 914 (H) 782 (H) 276 (H)   Review of Glycemic Control  Diabetes history: DM2 Outpatient Diabetes medications: NPH 36 units BID, Regular 0-22 units TID with meals, Metformin XR 500 mg BID, Ozempic 1 mg Qweek Current orders for Inpatient glycemic control: Novolog 0-9 units TID with meals   Inpatient Diabetes Program Recommendations:     Insulin: Lab glucose 292 mg/dl this am. Please consider ordering Semglee 8 units Q24H and Novolog 0-5 units at bedtime.    NOTE: Patient admitted with foot ulcer of left great toe. Initial lab glucose 71 mg/dl and down to 44 mg/dl at 95:62 on 13/08/65. CBG 141 mg/dl at 7:84 am today. In reviewing chart, noted patient sees Dr. Elvera Lennox (Endocrinologist) and was last seen 06/04/23. Per office note on 06/04/23 patient instructed to:  Please continue: - Metformin XR 500 mg 2x a day - Ozempic 1 mg weekly   Breakfast  Lunch   Dinner bedtime  Insulin N: 36 units -  - 36 units  Insulin R: 20-22 units  20-22 units  20-22 units    -  For sugars: 71-90: take 16 units of R insulin 50-70: do not take R insulin  Thanks, Betty Penner, RN, MSN, CDCES Diabetes Coordinator Inpatient Diabetes Program [phone] (Team Pager from 8am to 5pm)

## 2023-08-02 NOTE — Progress Notes (Signed)
Pharmacy Antibiotic Note  Betty [name] is a 67 y.o. female admitted on 07/29/2023 with diabetic foot ulcer. Pharmacy has been consulted for vancomycin dosing.  Plan:  Adjust vancomycin to 1.75 g IV q24h --Calculated AUC: 502, Cmin: 10.7 --Scr 0.8, Vd 0.5, IBW --Anticipate should be able to step down to oral antibiotics soon, should not need levels --Daily Scr per protocol while on vancomycin  Temp (24hrs), Avg:97.8 F (36.6 C), Min:97.4 F (36.3 C), Max:98.3 F (36.8 C)   Recent Labs  Lab 07/29/23 1613 07/30/23 0037 08/01/23 0440 08/02/23 0451  WBC 14.3* 10.8* 10.1 9.0  CREATININE 0.69 0.73  --  0.67  LATICACIDVEN 1.0  --   --   --     Estimated Creatinine Clearance: 89.7 mL/min (by C-G formula based on SCr of 0.67 mg/dL).    Allergies  Allergen Reactions   Lyrica [Pregabalin] Other (See Comments)    sleepy    Antimicrobials this admission: 10/18 Ceftriaxone >>  10/18 Flagyl >>  10/18 Vancomycin >>   Microbiology results: 10/18 BCx: NGTD 10/19 WCx: NGTD 10/19 Tissue Cx: Staphylococcus aureus, pending  Thank you for allowing pharmacy to be a part of this patient's care.  Tressie Ellis 08/02/2023 1:12 PM

## 2023-08-02 NOTE — Evaluation (Signed)
Occupational Therapy Evaluation Patient Details Name: Betty [name] MRN: [id number] DOB: 1955-12-30 Today's Date: 08/02/2023   History of Present Illness Betty [name] is a 67yoF who comes to [facility] on 07/29/23 after recent podiatry debridement of 36mo hallux ulcer, podiatry concerned for progression to osteo, recommended ABI, MRI. PMH: DM c diabetic peripheral neuropathy, HTN, HLD, CAD with DES, morbid obesity, OSA not on CPAP, vertigo. Pt went for Left hallux disarticulation at [facility] on 10/19, orders for WBAT in surgical shoe although limited.   Clinical Impression   Pt was seen for OT evaluation this date. Prior to hospital admission, pt was living at home alone and MOD I/IND with all mobility, ADLs, and IADLs. Does not drive d/t cataracts. Family lives in different states, will be here a few weeks during her recovery.  Pt presents to acute OT demonstrating impaired ADL performance and functional mobility 2/2 pain and mild balance deficits from recent amputation (See OT problem list for additional functional deficits). No pain reported during session. Pt currently requires MOD I for bed mobility and STS from EOB. She ambulated to the bathroom and back using her cane with SUP. Toilet transfer with SUP and hygiene/clothing management performed with MOD I. Pt stood at sink for hand hygiene with MOD I/SUP. Pt performed LB dressing to doff sock and don regular shoe and surgical shoe at EOB with MOD I, extra time. Extensive time spent talking with pt and son about bathroom set up and equipment needs upon DC. Pt would benefit from 1-2 more skilled OT visits to ensure carryover to address noted impairments and functional limitations (see below for any additional details) in order to maximize safety and independence while minimizing falls risk and caregiver burden. May benefit from [facility] consult to ensure home modifications and recommendations for DME are appropriate at home.       If plan is  discharge home, recommend the following:      Functional Status Assessment  Patient has had a recent decline in their functional status and demonstrates the ability to make significant improvements in function in a reasonable and predictable amount of time.  Equipment Recommendations  Tub/shower bench;BSC/3in1    Recommendations for Other Services       Precautions / Restrictions Restrictions Weight Bearing Restrictions: Yes LLE Weight Bearing: Weight bearing as tolerated Other Position/Activity Restrictions: to wear L shoe for offloading      Mobility Bed Mobility Overal bed mobility: Modified Independent                  Transfers Overall transfer level: Modified independent Equipment used: Straight cane               General transfer comment: used her cane present in room for mobility to bathroom and back with MOD I/SUP      Balance Overall balance assessment: Modified Independent, No apparent balance deficits (not formally assessed)                                         ADL either performed or assessed with clinical judgement   ADL Overall ADL's : Needs assistance/impaired                     Lower Body Dressing: Supervision/safety;Sitting/lateral leans   Toilet Transfer: Supervision/safety   Toileting- Clothing Manipulation and Hygiene: Modified independent       Functional mobility  during ADLs: Supervision/safety General ADL Comments: [facility] use     Vision         Perception         Praxis         Pertinent Vitals/Pain Pain Assessment Pain Assessment: No/denies pain Pain Intervention(s): Monitored during session     Extremity/Trunk Assessment Upper Extremity Assessment Upper Extremity Assessment: Overall WFL for tasks assessed           Communication     Cognition Arousal: Alert Behavior During Therapy: WFL for tasks assessed/performed Overall Cognitive Status: Within Functional Limits for  tasks assessed                                       General Comments       Exercises Other Exercises Other Exercises: Educated to son and patient regardin gbathroom set up upon return home if cleared to take showers. Recommended HH shower head and holder for it and shower bench use   Shoulder Instructions      Home Living Family/patient expects to be discharged to:: Private residence Living Arrangements: Alone Available Help at Discharge: Family (family in town x2 wejs, otherwise, does not have local assistance) Type of Home: House Home Access: Stairs to enter Secretary/administrator of Steps: 3 Entrance Stairs-Rails: Left;Right Home Layout: One level     Bathroom Shower/Tub: Dietitian: Rollator (4 wheels);Cane - single point   Additional Comments: Son lives in [place], is visiting for 2 weeks to assist; niece is also visiting from [place]; has a shower chair that does not fit in her shower.      Prior Functioning/Environment               Mobility Comments: has been WBAT, regular shoes prior to surgery, not in a position to stay off feet due to living alone; stopped driving end of August with new diagnosis of cataracts; ADLs Comments: modI baseline, wears poise pads due to urge incontinence at baseline.        OT Problem List: Pain;Decreased strength;Decreased knowledge of use of DME or AE;Decreased knowledge of precautions      OT Treatment/Interventions: Self-care/ADL training;Therapeutic exercise;Therapeutic activities;DME and/or AE instruction;Patient/family education;Balance training    OT Goals(Current goals can be found in the care plan section) Acute Rehab OT Goals Patient Stated Goal: return home OT Goal Formulation: With patient/family Time For Goal Achievement: 08/16/23 Potential to Achieve Goals: Good ADL Goals Pt Will Perform Lower Body Bathing: with modified independence;sit to/from stand Pt Will Perform  Lower Body Dressing: with modified independence;sit to/from stand Additional ADL Goal #1: Pt will demo/verbalize appropriate precautions and wearing of her L surgical shoe to promote healing of L toe amputation to regain IND and maximize safety with ADL/IADL performance.  OT Frequency: Min 1X/week    Co-evaluation              AM-PAC OT "6 Clicks" Daily Activity     Outcome Measure Help from another person eating meals?: None Help from another person taking care of personal grooming?: None Help from another person toileting, which includes using toliet, bedpan, or urinal?: None Help from another person bathing (including washing, rinsing, drying)?: A Little Help from another person to put on and taking off regular upper body clothing?: None Help from another person to put on and taking off regular lower  body clothing?: A Little 6 Click Score: 22   End of Session Equipment Utilized During Treatment: Other (comment) (cane) Nurse Communication: Mobility status  Activity Tolerance: Patient tolerated treatment well Patient left: in bed;with call bell/phone within reach;with bed alarm set;with family/visitor present  OT Visit Diagnosis: Muscle weakness (generalized) (M62.81);Other abnormalities of gait and mobility (R26.89)                Time: 4098-1191 OT Time Calculation (min): 44 min Charges:  OT General Charges $OT Visit: 1 Visit OT Evaluation $OT Eval Low Complexity: 1 Low OT Treatments $Self Care/Home Management : 8-22 mins $Therapeutic Activity: 8-22 mins Jazzy Parmer, OTR/L 08/02/23, 12:59 PM  Jonahtan Manseau E [place] Goeken 08/02/2023, 12:55 PM

## 2023-08-02 NOTE — Progress Notes (Signed)
Patient received from Cath lab. Right groin site clean and dry.  Betty [name]

## 2023-08-02 NOTE — TOC Progression Note (Signed)
Transition of Care [facility]) - Progression Note    Patient Details  Name: Betty [name] MRN: [id number] Date of Birth: Sep 28, 1956  [facility] of [facility]) CM/SW Contact  Truddie Hidden, RN Phone Number: 08/02/2023, 9:54 AM  Clinical Narrative:    Transportation resources provided in AVS.          Expected Discharge Plan and Services                                               Social Determinants of Health (SDOH) Interventions SDOH Screenings   Tobacco Use: Low Risk  (07/30/2023)    Readmission Risk Interventions     No data to display

## 2023-08-02 NOTE — Progress Notes (Signed)
Progress Note   Patient: Betty [name] ZOX:[id number] DOB: [birth year] DOA: 07/29/2023     4 DOS: the patient was seen and examined on 08/02/2023   Brief hospital course: HPI on admission 07/29/23: "Betty [name] is a 67 y.o. female with medical history significant of DM, HTN, HLD, CAD with DES, morbid obesity, OSA not on CPAP, vertigo, peripheral neuropathy who presents with left great toe ulcer.   Patient states that she has chronic left great toe ulcer for more than 10 months.  She has been following up with Dr. Lilian Kapur of podiatry. Pt had debridement done at wound clinic yesterday. Per Dr. Vara Guardian clinic note, patient has worsening foot ulcer with possible osteomyelitis. Dr. Lilian Kapur recommended patient to come to the hospital to get MIR and ABI..."  Patient admitted to hospital with Podiatry consulted.  MRI of the foot showed osteomyelitis.  Pt underwennt L great toe amputation 10/19.  Vascular surgery consulted with plan for angiogram 10/21 to assess for PAD.  Assessment and Plan: * Acute osteomyelitis of toe, left [facility]) Podiatry consulted. Now s/p L great toe amputation 10/19 Follow surgical path / cultures Continue broad spectrum IV antibiotics - Vanc, Rocephin, Flagyl for now Vascular surgery consult for evaluation - angiogram and potential revascularization today 10/21. NPO for now, resume diet post-procedure  Diabetic foot ulcer_left great toe See osteomyelitis  Hypoglycemia Resolved.  CBG's evening of 10/17 were 53 >> 44 >> 59 >> 167 --Hypoglycemia protocol --Adjusted insulin regimen accordingly and per diabetes coordinator recs  Poorly controlled type 2 diabetes mellitus with circulatory disorder (HCC) Recent A1c 7.8, poorly controlled.  Patient is taking Novolin, Ozempic, metformin, NPH insulin. 10/21 -- sugars in 200's, increase insulin regimen --Semglee 8 units daily --Sliding scale novolog AC/HS --Hold home medications  CAD, NATIVE  VESSEL Stable. No CP. --Continue aspirin, Crestor --Hold Plavix for surgery & resume when okay with podiatry  HYPERTENSION, BENIGN --IV hydralazine as needed --Coreg, ramipril  Hyperlipidemia --Crestor  Morbid obesity with BMI of 40.0-44.9, adult (HCC) Body mass index is 40.99 kg/m. Complicates overall care and prognosis.  Recommend lifestyle modifications including physical activity and diet for weight loss and overall long-term health.  Osteomyelitis of great toe of left foot (HCC) .        Subjective: Pt seen awake sitting up in bed, family arrived to visit.  She is having increased L knee pain from her arthritis, it has made it hard to sleep at night.  Pt thirsty, NPO awaiting vascular procedure today.  She denies other acute complaints.     Physical Exam: Vitals:   08/01/23 1918 08/02/23 0349 08/02/23 0816 08/02/23 1238  BP: (!) 140/45 (!) 126/29 (!) 144/64 (!) 137/57  Pulse: 71 66 65 68  Resp: 16  18 17   Temp: 98.3 F (36.8 C) 97.6 F (36.4 C) (!) 97.4 F (36.3 C) 97.7 F (36.5 C)  TempSrc: Oral Oral  Oral  SpO2: 98% 95% 97% 95%  Weight:    118.7 kg  Height:    5\' 7"  (1.702 m)   General exam: awake, alert, no acute distress, obese Respiratory system: on room air, normal respiratory effort. CTAB no wheezes or rhonchi Cardiovascular system: normal S1/S2, RRR, no pedal edema.   Gastrointestinal system: soft, NT, ND Central nervous system: A&O x 3. no gross focal neurologic deficits, normal speech Extremities: left foot dressed and ace wrapped, no lower extremity edema Skin: dry, intact, normal temperature Psychiatry: normal mood, congruent affect, judgement and insight appear normal  Data Reviewed:  Notable labs -- Hbg 11.5 stable.  Na 134, glucose 292, Ca 8.8, albumin 3.4  CBG's 175 >> 274 >> 276 >> 287 >> 226  Consults - Podiatry, Vascular Surgery  Family Communication: at bedside on rounds todya  Disposition: Status is: Inpatient Remains  inpatient appropriate because: remains on IV antibiotics pending cultures / surgical path.  Vascular procedure today.    Planned Discharge Destination: Home with Home Health    Time spent: 36 minutes  Author: Pennie Banter, DO 08/02/2023 1:13 PM  For on call review [email or web address].

## 2023-08-02 NOTE — Discharge Instructions (Signed)
Transportation Resources  Agency Name: [facility] Agency Address: [address] [place], [place] [address] Phone: [phone] Email: [email or web address] Website: [email or web address] Service(s) Offered: Housing services, self-sufficiency, congregate meal program, weatherization program, Field seismologist program, emergency food assistance,  housing counseling, home ownership program, wheels-towork program.  Agency Name: [facility] [employer] [phone]) Address: [address] [place], [place] [address] Phone: [phone] Website: [email or web address] Service(s) Offered: Transportation for [employer], subscription and demand response; Dial-a-Ride for citizens 10 years of age or older.  Agency Name: Department of Social Services Address: [address] [place], [place] [address] Phone: [phone] Service(s) Offered: Child support services; child welfare services; food stamps; Medicaid; work first family assistance; and aid with fuel,  rent, food and medicine, transportation assistance.  Agency Name: Disabled [employer] (DAV) Transportation  Network Phone: [phone] Service(s) Offered: Transports veterans to the [facility] medical center. Call  forty-eight hours in advance and leave the name, telephone  number, date, and time of appointment. Veteran will be  contacted by the driver the day before the appointment to  arrange a pick up point   Transportation Resources  Agency Name: [facility] Agency Address: [address] [place], [place] [address] Phone: [phone] Email: [email or web address] Website: [email or web address].[email or web address] Service(s) Offered: Housing services, self-sufficiency, congregate meal program, weatherization program, Field seismologist program, emergency food assistance,  housing counseling, home ownership program, wheels-towork  program.  Agency Name: [facility] [employer] [phone]) Address: [address], [place], [place] [address] Phone: [phone] Website: [email or web address] Service(s) Offered: Transportation for [employer], subscription and demand response; Dial-a-Ride for citizens 40 years of age or older.  Agency Name: Department of Social Services Address: [address] [place], [place] [address] Phone: [phone] Service(s) Offered: Child support services; child welfare services; food stamps; Medicaid; work first family assistance; and aid with fuel,  rent, food and medicine, transportation assistance.  Agency Name: Disabled [employer] (DAV) Transportation  Network Phone: [phone] Service(s) Offered: Transports veterans to the [facility] medical center. Call  forty-eight hours in advance and leave the name, telephone  number, date, and time of appointment. Veteran will be  contacted by the driver the day before the appointment to  arrange a pick up point    [employer]A currently provides door to door services. ACTA connects with PART daily for services to [facility]. ACTA also performs contract services to [employer] operates 27 vehicles, all but 3 mini-vans are equipped with lifts for special needs as well as the general public. ACTA drivers are each CDL certified and trained in First Aid and CPR. ACTA was established in 2002 by [employer]. An independent Industrial/product designer. ACTA operates via Cytogeneticist with required Research scientist (physical sciences) from [place]. ACT[employer] provides over 80,000 passenger trips each year, including [facility] and [employer] sites.  Call at least by 11 AM one business day prior to needing transportation  [employer].                      [place], [place] [address]     Office  Hours: Monday-Friday  8 AM - 5 PM

## 2023-08-02 NOTE — Op Note (Signed)
[facility]  Percutaneous Study/Intervention Procedural Note   Date of Surgery: 08/02/2023  Surgeon(s):Carline Dura    Assistants:none  Pre-operative Diagnosis: PAD with ulceration LLE  Post-operative diagnosis:  Same  Procedure(s) Performed:             1.  Ultrasound guidance for vascular access right femoral artery             2.  Catheter placement into left SFA from right femoral approach             3.  Aortogram and selective left lower extremity angiogram             4.  StarClose closure device right femoral artery  EBL: 5 cc  Contrast: 25 cc  Fluoro Time: 0.8 minutes  Moderate Conscious Sedation Time: approximately 16 minutes using 1 mg of Versed and 50 mcg of Fentanyl              Indications:  Patient is a 67 y.o.female with a gangrenous left great toe requiring removal and concern for malperfusion. The patient is brought in for angiography for further evaluation and potential treatment.  Due to the limb threatening nature of the situation, angiogram was performed for attempted limb salvage. The patient is aware that if the procedure fails, amputation would be expected.  The patient also understands that even with successful revascularization, amputation may still be required due to the severity of the situation.  Risks and benefits are discussed and informed consent is obtained.   Procedure:  The patient was identified and appropriate procedural time out was performed.  The patient was then placed supine on the table and prepped and draped in the usual sterile fashion. Moderate conscious sedation was administered during a face to face encounter with the patient throughout the procedure with my supervision of the RN administering medicines and monitoring the patient's vital signs, pulse oximetry, telemetry and mental status throughout from the start of the procedure until the patient was taken to the recovery room. Ultrasound was used to evaluate the  right common femoral artery.  It was patent .  A digital ultrasound image was acquired.  A Seldinger needle was used to access the right common femoral artery under direct ultrasound guidance and a permanent image was performed.  A 0.035 J wire was advanced without resistance and a 5Fr sheath was placed.  Pigtail catheter was placed into the aorta and an AP aortogram was performed. This demonstrated normal renal arteries and normal aorta and iliac segments without significant stenosis. I then crossed the aortic bifurcation and advanced to the left femoral head and then into the left SFA to opacify distally. Selective left lower extremity angiogram was then performed. This demonstrated fairly normal common femoral artery, profunda femoris artery, and an SFA with only mild disease less than 30%.  The popliteal artery was normal.  The anterior tibial was large and patent focal stenosis and good filling of the foot.  The peroneal artery provided a second runoff vessel distally without focal stenosis.  The posterior tibial artery was chronically occluded without distal reconstitution.  She had adequate perfusion for wound healing.  I elected to terminate the procedure. The sheath was removed and StarClose closure device was deployed in the right femoral artery with excellent hemostatic result. The patient was taken to the recovery room in stable condition having tolerated the procedure well.  Findings:  Aortogram:  This demonstrated normal renal arteries and normal aorta and iliac segments without significant stenosis.             Left Lower Extremity:  This demonstrated fairly normal common femoral artery, profunda femoris artery, and an SFA with only mild disease less than 30%.  The popliteal artery was normal.  The anterior tibial was large and patent focal stenosis and good filling of the foot.  The peroneal artery provided a second runoff vessel distally without focal stenosis.  The posterior tibial  artery was chronically occluded without distal reconstitution.    Disposition: Patient was taken to the recovery room in stable condition having tolerated the procedure well.  Complications: None  Festus Barren 08/02/2023 1:55 PM   This note was created with Dragon Medical transcription system. Any errors in dictation are purely unintentional.

## 2023-08-02 NOTE — Plan of Care (Signed)

## 2023-08-03 DIAGNOSIS — M86172 Other acute osteomyelitis, left ankle and foot: Secondary | ICD-10-CM | POA: Diagnosis not present

## 2023-08-03 LAB — GLUCOSE, CAPILLARY
Glucose-Capillary: 232 mg/dL — ABNORMAL HIGH (ref 70–99)
Glucose-Capillary: 262 mg/dL — ABNORMAL HIGH (ref 70–99)
Glucose-Capillary: 260 mg/dL — ABNORMAL HIGH (ref 70–99)
Glucose-Capillary: 341 mg/dL — ABNORMAL HIGH (ref 70–99)

## 2023-08-03 LAB — CREATININE, SERUM
Creatinine, Ser: 0.65 mg/dL (ref 0.44–1.00)
GFR, Estimated: >60 mL/min (ref >60)

## 2023-08-03 MED ORDER — INSULIN NPH (HUMAN) (ISOPHANE) 100 UNIT/ML ~~LOC~~ SUSP: SUBCUTANEOUS | Status: AC

## 2023-08-03 MED ORDER — INSULIN GLARGINE-YFGN 100 UNIT/ML ~~LOC~~ SOLN
10.0000 [IU] | Freq: Every day | SUBCUTANEOUS | Status: DC
Start: 2023-08-03 — End: 2023-08-04
  Administered 2023-08-03: 10 [IU] via SUBCUTANEOUS
  Filled 2023-08-03: qty 0.1

## 2023-08-03 MED ORDER — INSULIN REGULAR HUMAN 100 UNIT/ML IJ SOLN: INTRAMUSCULAR | Status: AC

## 2023-08-03 MED ORDER — HYDROCODONE-ACETAMINOPHEN 5-325 MG PO TABS: 1.0000 | ORAL_TABLET | Freq: Four times a day (QID) | ORAL | 0 refills | Status: DC | PRN

## 2023-08-03 MED ORDER — INSULIN REGULAR HUMAN 100 UNIT/ML IJ SOLN: INTRAMUSCULAR | Status: DC

## 2023-08-03 MED ORDER — OSTEO BI-FLEX ADV TRIPLE ST PO TABS
1.0000 | ORAL_TABLET | Freq: Two times a day (BID) | ORAL | Status: DC
  Filled 2023-08-03 (×2): qty 1

## 2023-08-03 NOTE — Discharge Summary (Signed)
Physician Discharge Summary   Patient: Betty [name] MRN: [id number] DOB: 11/07/1955  Admit date:     07/29/2023  Discharge date: 08/02/2023  Discharge Physician: Pennie Banter   PCP: Betty Ravel, MD   Recommendations at discharge:    Follow up as scheduled with Podiatry in 1 week Follow up with Primary Care in 1-2 weeks Repeat CBC, BMP at follow up and as needed Weight-bearing as tolerated in surgical shoe Wound care: Betadine wet-to-dry gauze and 4" Ace wrap (2-3 times per week) Follow up with Endocrinology as soon as possible regarding insulin regimen. Pt requiring significantly less insulin during admission, therefore home regimen was reduced to prevent hypoglycemia.  Pt advised to record blood glucose readings at home, # units insulin being used & to follow up closely for regimen adjustements.  Discharge Diagnoses: Principal Problem:   Acute osteomyelitis of toe, left (HCC) Active Problems:   Diabetic foot ulcer_left great toe   Poorly controlled type 2 diabetes mellitus with circulatory disorder (HCC)   CAD, NATIVE VESSEL   Hyperlipidemia   HYPERTENSION, BENIGN   Morbid obesity with BMI of 40.0-44.9, adult (HCC)   Osteomyelitis of great toe of left foot [facility])  Resolved Problems:   Hypoglycemia  Hospital Course:  HPI on admission 07/29/23: "Betty [name] is a 67 y.o. female with medical history significant of DM, HTN, HLD, CAD with DES, morbid obesity, OSA not on CPAP, vertigo, peripheral neuropathy who presents with left great toe ulcer.   Patient states that she has chronic left great toe ulcer for more than 10 months.  She has been following up with Dr. Lilian Kapur of podiatry. Pt had debridement done at wound clinic yesterday. Per Dr. Vara Guardian clinic note, patient has worsening foot ulcer with possible osteomyelitis. Dr. Lilian Kapur recommended patient to come to the hospital to get MIR and ABI..."   Patient admitted to hospital with Podiatry  consulted.   MRI of the foot showed osteomyelitis.   Pt underwennt L great toe amputation 10/19.   Vascular surgery consulted.  Pt underwent angiogram 10/21 to assess for PAD, circulation was noted patent and intact to the foot, no interventions were needed.  Further hospital course and management as outlined below.  10/22 -- pt doing well this AM.  Improving with PT and was able to navigate steps.  No acute complaints.  Pt is cleared by consultants, medically stable and agreeable for discharge home today with [facility].  Discussed insulin regimen with patient and diabetes coordinator.  Will need close PCP or endorinology follow up to adjust as needed, given significantly reduced insulin needs at this time.    Assessment and Plan:  * Acute osteomyelitis of toe, left [facility]) Podiatry consulted. Now s/p L great toe amputation 10/19 Follow surgical path / cultures Treated with broad spectrum IV antibiotics - Vanc, Rocephin, Flagyl  Clean margins on path, per podiatry no antibiotics needed at discharge Podiatry to follow cultures/path to finalized Vascular surgery consult for evaluation - angiogram 10/21 - no intervention needed, adequate blood flow to the foot. Follow up as scheduled with Podiatry Home health arranged for PT   Diabetic foot ulcer_left great toe See osteomyelitis   Hypoglycemia  Resolved.  CBG's evening of 10/17 were 53 >> 44 >> 59 >> 167 --Hypoglycemia protocol --Adjusted insulin regimen accordingly and per diabetes coordinator recs   Poorly controlled type 2 diabetes mellitus with circulatory disorder (HCC) Recent A1c 7.8, poorly controlled.  Patient is taking Novolin, Ozempic, metformin, NPH insulin. 10/21 --  sugars in 200's, increased insulin regimen --covered with Semglee and Novolog during admission --Held home medications -- can be resumed at d/c --HOME INSULIN REGIMEN for D/C REDUCED AS BELOW -- due to reduced insulin needs during admission and hypoglycemia on  admission.   --Close PCP or Endocrinology follow up   CAD, NATIVE VESSEL Stable. No CP. --Continue aspirin, Crestor --Plavix held for surgery -- okay to resume, per podiatry   HYPERTENSION, BENIGN --IV hydralazine as needed --Coreg, ramipril   Hyperlipidemia --Crestor   Morbid obesity with BMI of 40.0-44.9, adult (HCC) Body mass index is 40.99 kg/m. Complicates overall care and prognosis.  Recommend lifestyle modifications including physical activity and diet for weight loss and overall long-term health.   Osteomyelitis of great toe of left foot (HCC) .       Consultants: Podiatry, Vascular Surgery Procedures performed: L great toe amputation, angiogram  Disposition: Home health Diet recommendation:  Cardiac and Carb modified diet DISCHARGE MEDICATION: Allergies as of 08/03/2023       Reactions   Lyrica [pregabalin] Other (See Comments)   sleepy        Medication List     TAKE these medications    acetaminophen 650 MG CR tablet Commonly known as: TYLENOL Take 650-1,300 mg by mouth every 8 (eight) hours as needed for pain.   aspirin EC 81 MG tablet Take 81 mg by mouth daily.   BD Veo Insulin Syringe U/F 31G X 15/64" 1 ML Misc Generic drug: Insulin Syringe-Needle U-100 USE 4 TIMES DAILY AS DIRECTED   beta carotene 30865 UNIT capsule Take 25,000 Units by mouth daily.   carvedilol 3.125 MG tablet Commonly known as: COREG Take 1 tablet (3.125 mg total) by mouth 2 (two) times daily with a meal.   clopidogrel 75 MG tablet Commonly known as: PLAVIX TAKE ONE TABLET BY MOUTH EVERY DAY   [employer] Magnesium Oxide 250 MG Tabs Generic drug: Magnesium Oxide -Mg Supplement Take by mouth.   estradiol 0.1 MG/GM vaginal cream Commonly known as: ESTRACE Place 0.25 Applicatorfuls vaginally 3 (three) times a week.   FreeStyle Libre 3 Sensor Misc 1 each by Does not apply route every 14 (fourteen) days.   gabapentin 100 MG capsule Commonly known as: NEURONTIN TAKE  1 CAPSULE BY MOUTH TWICE DAILY   HYDROcodone-acetaminophen 5-325 MG tablet Commonly known as: NORCO/VICODIN Take 1 tablet by mouth every 6 (six) hours as needed for severe pain (pain score 7-10) or moderate pain (pain score 4-6).   icosapent Ethyl 1 g capsule Commonly known as: VASCEPA Take 2 capsules (2 g total) by mouth 2 (two) times daily. What changed: how much to take   insulin NPH Human 100 UNIT/ML injection Commonly known as: NovoLIN N INJECT 8 UNITS SUBCUTANEOUSLY TWO TIMES A DAY What changed: additional instructions   insulin regular 100 units/mL injection Commonly known as: NovoLIN R INJECT 5 units into the skin THREE TIMES daily What changed: additional instructions   latanoprost 0.005 % ophthalmic solution Commonly known as: XALATAN Apply to eye.   metFORMIN 500 MG 24 hr tablet Commonly known as: GLUCOPHAGE-XR TAKE 1 TABLET BY MOUTH 3 TIMES DAILY WITH MEALS What changed: See the new instructions.   Myrbetriq 25 MG Tb24 tablet Generic drug: mirabegron ER Take 25 mg by mouth daily.   nitroGLYCERIN 0.4 MG SL tablet Commonly known as: NITROSTAT PLACE 1 TABLET UNDER THE TONGUE EVERY 5 MINUTES AS NEEDED FOR CHEST PAIN UP TO 3 DOSES, IF SYMPTOMS PERSIST CALL 911   OSTEO  BI-FLEX JOINT SHIELD PO Take 1 tablet by mouth 2 (two) times daily.   Ozempic (1 MG/DOSE) 4 MG/3ML Sopn Generic drug: Semaglutide (1 MG/DOSE) INJECT 1MG  SUBCUTANEOUSLY  ONCE A WEEK   ramipril 10 MG capsule Commonly known as: ALTACE TAKE 1 CAPSULE BY MOUTH DAILY   rosuvastatin 10 MG tablet Commonly known as: CRESTOR Take 1 tablet (10 mg total) by mouth daily.   THERAWORX RELIEF EX Apply 1 application topically 3 (three) times daily as needed (pain.).   UltraTRAK PRO Test test strip Generic drug: glucose blood TEST BLOOD SUGAR 5 TIMES DAILY AS INSTRUCTED   Vitamin D3 250 MCG (10000 UT) capsule Take 10,000 Units by mouth daily.   ZIMS MAX-FREEZE EX Apply 1 application topically 4  (four) times daily as needed (pain).               Discharge Care Instructions  (From admission, onward)           Start     Ordered   08/03/23 0000  Change dressing (specify)       Comments: Dressing change: 2-3 times per week with Betadine wet-to-dry gauze and 4" Ace wrap   08/03/23 1744            Follow-up Information     Hamrick, Durward Fortes, MD. Schedule an appointment as soon as possible for a visit.   Specialty: Family Medicine Why: Hospital follow up within 1 week Contact information: [address] [place] [address] [phone]         Edwin Cap, DPM. Call.   Specialty: Podiatry Why: Follow up in 1 week. Call to confirm appointment time. Contact information: [address] [place] [address] [phone]         Carlus Pavlov, MD. Call.   Specialty: Internal Medicine Why: Schedule close follow up for insulin regimen changes during hospital admission and hypoglycemic episodes. Contact information: [address] [place] [address] [phone]                Discharge Exam: Ceasar Mons Weights   07/29/23 2347 07/29/23 2358 08/02/23 1238  Weight: 118.7 kg 118.7 kg 118.7 kg   General exam: awake, alert, no acute distress HEENT: atraumatic, clear conjunctiva, anicteric sclera, moist mucus membranes, hearing grossly normal  Respiratory system: CTAB, no wheezes, rales or rhonchi, normal respiratory effort. Cardiovascular system: normal S1/S2, RRR, no JVD, murmurs, rubs, gallops,  no pedal edema.   Gastrointestinal system: soft, NT, ND, no HSM felt, +bowel sounds. Central nervous system: A&O x 3. no gross focal neurologic deficits, normal speech Extremities: foot dressing clean dry intact, no edema, normal tone Skin: dry, intact, normal temperature Psychiatry: normal mood, congruent affect, judgement and insight appear normal   Condition at discharge: stable  The results of significant  diagnostics from this hospitalization (including imaging, microbiology, ancillary and laboratory) are listed below for reference.   Imaging Studies: PERIPHERAL VASCULAR CATHETERIZATION  Result Date: 08/02/2023 See surgical note for result.  DG Foot 2 Views Left  Result Date: 07/31/2023 CLINICAL DATA:  67 year old female status post amputation of the great toe. EXAM: LEFT FOOT - 2 VIEW COMPARISON:  07/29/2023. FINDINGS: Patient is status post amputation of the first digit beyond the first metatarsal. No acute displaced fracture or dislocation noted. No destructive bony lesions. Extensive degenerative changes of osteoarthritis are noted throughout the mid and hindfoot. Small plantar and dorsal calcaneal spurs are incidentally noted. Vascular calcifications are noted. IMPRESSION: 1. Status post great  toe amputation without acute complicating features. Electronically Signed   By: Trudie Reed M.D.   On: 07/31/2023 12:44   US ARTERIAL ABI (SCREENING LOWER EXTREMITY)  Result Date: 07/30/2023 CLINICAL DATA:  Left first toe ulcer with osteomyelitis. History diabetes, hypertension and hyperlipidemia. EXAM: NONINVASIVE PHYSIOLOGIC VASCULAR STUDY OF BILATERAL LOWER EXTREMITIES TECHNIQUE: Evaluation of both lower extremities were performed at rest, including calculation of ankle-brachial indices with single level Doppler, pressure and pulse volume recording. COMPARISON:  None Available. FINDINGS: Right ABI: 0.74 based on posterior tibial artery pressure. The dorsalis pedis artery is noncompressible. Left ABI: 0.80 based on posterior tibial artery pressure. The dorsalis pedis artery is noncompressible. Right Lower Extremity: Monophasic posterior tibial and biphasic dorsalis pedis artery waveforms. Left Lower Extremity: Monophasic posterior tibial and dorsalis pedis waveforms. 0.5-0.79 Moderate PAD IMPRESSION: Mild to moderate bilateral ABI depression at rest of 0.74 on the right and 0.80 on the left.  Bilateral dorsalis pedis arteries are noncompressible, likely on the basis of vascular calcification. Electronically Signed   By: Irish Lack M.D.   On: 07/30/2023 17:02   MRI Left foot without contrast  Result Date: 07/30/2023 CLINICAL DATA:  Left great toe infection. EXAM: MRI OF THE LEFT FOOT WITHOUT CONTRAST TECHNIQUE: Multiplanar, multisequence MR imaging of the left forefoot was performed. No intravenous contrast was administered. COMPARISON:  Left great toe x-rays from yesterday. FINDINGS: Bones/Joint/Cartilage Diffuse edema with corresponding decreased T1 marrow signal involving first distal phalanx. Mild marrow edema in the medial aspect of the first proximal phalanx head. No fracture or dislocation. Midfoot degenerative changes. No joint effusion. Ligaments Collateral ligaments are intact. Muscles and Tendons Flexor and extensor tendons are intact. No tenosynovitis. Increased T2 signal within and atrophy of the intrinsic muscles of the forefoot, nonspecific, but likely related to diabetic muscle changes. Soft tissue Diffuse soft tissue swelling. Plantar great toe ulceration. No fluid collection. No soft tissue mass. IMPRESSION: 1. Plantar great toe ulceration with osteomyelitis of the first distal phalanx. 2. Mild marrow edema in the medial aspect of the first proximal phalanx head is nonspecific, but could reflect early osteomyelitis. 3. No abscess. Electronically Signed   By: Obie Dredge M.D.   On: 07/30/2023 10:48   DG Toe Great Left  Result Date: 07/29/2023 CLINICAL DATA:  Wound check on left great toe. Diabetic ulcer and debridement done at Wound Clinic yesterday. EXAM: LEFT GREAT TOE COMPARISON:  05/03/2015 FINDINGS: No acute fracture or dislocation. No radiographic evidence of osteomyelitis. Ulceration about the plantar aspect of the first toe. IMPRESSION: 1. No radiographic evidence of osteomyelitis. 2. Ulceration about the plantar aspect of the first toe. Electronically Signed    By: Minerva Fester M.D.   On: 07/29/2023 20:41   DG Chest 2 View  Result Date: 07/29/2023 CLINICAL DATA:  Provided history: Infection. EXAM: CHEST - 2 VIEW COMPARISON:  Prior chest radiographs 04/11/2018 and earlier. FINDINGS: Heart size within normal limits. Aortic atherosclerosis. Small bandlike opacity within the lateral left lung base most consistent with atelectasis. No appreciable airspace consolidation on the right. No evidence of pleural effusion or pneumothorax. No acute osseous abnormality identified. Degenerative changes of the spine. IMPRESSION: 1. Small bandlike opacity within the lateral left lung base most consistent with atelectasis. 2. Aortic Atherosclerosis (ICD10-I70.0). Electronically Signed   By: Jackey Loge D.O.   On: 07/29/2023 17:38    Microbiology: Results for orders placed or performed during the hospital encounter of 07/29/23  Culture, blood (Routine X 2) w Reflex to ID Panel  Status: None   Collection Time: 07/30/23 12:37 AM   Specimen: BLOOD  Result Value Ref Range Status   Specimen Description BLOOD BLOOD RIGHT ARM  Final   Special Requests   Final    BOTTLES DRAWN AEROBIC AND ANAEROBIC Blood Culture adequate volume   Culture   Final    NO GROWTH 5 DAYS Performed at [facility], [address] [place], [place] [address]    Report Status 08/04/2023 FINAL  Final  Culture, blood (Routine X 2) w Reflex to ID Panel     Status: None   Collection Time: 07/30/23 12:37 AM   Specimen: BLOOD  Result Value Ref Range Status   Specimen Description BLOOD BLOOD LEFT ARM  Final   Special Requests   Final    BOTTLES DRAWN AEROBIC AND ANAEROBIC Blood Culture adequate volume   Culture   Final    NO GROWTH 5 DAYS Performed at [facility], [address] [place], [place] [address]    Report Status 08/04/2023 FINAL  Final  Aerobic/Anaerobic Culture w Gram Stain (surgical/deep wound)     Status: None   Collection Time: 07/31/23  8:26 AM    Specimen: Wound; Tissue  Result Value Ref Range Status   Specimen Description   Final    WOUND Performed at [facility], [address] [place], [place] [address]    Special Requests   Final    NONE Performed at [facility], [address] [place], [place] [address]    Gram Stain RARE WBC SEEN RARE GRAM POSITIVE COCCI   Final   Culture   Final    RARE STAPHYLOCOCCUS AUREUS NO ANAEROBES ISOLATED Performed at [facility], [address] [place], [place] [address]    Report Status 08/05/2023 FINAL  Final   Organism ID, Bacteria STAPHYLOCOCCUS AUREUS  Final      Susceptibility   Staphylococcus aureus - MIC*    CIPROFLOXACIN <=0.5 SENSITIVE Sensitive     ERYTHROMYCIN <=0.25 SENSITIVE Sensitive     GENTAMICIN <=0.5 SENSITIVE Sensitive     OXACILLIN <=0.25 SENSITIVE Sensitive     TETRACYCLINE <=1 SENSITIVE Sensitive     VANCOMYCIN <=0.5 SENSITIVE Sensitive     TRIMETH/SULFA <=10 SENSITIVE Sensitive     CLINDAMYCIN <=0.25 SENSITIVE Sensitive     RIFAMPIN <=0.5 SENSITIVE Sensitive     Inducible Clindamycin NEGATIVE Sensitive     LINEZOLID 2 SENSITIVE Sensitive     * RARE STAPHYLOCOCCUS AUREUS  Aerobic/Anaerobic Culture w Gram Stain (surgical/deep wound)     Status: None   Collection Time: 07/31/23  8:26 AM   Specimen: Foot, Left; Wound  Result Value Ref Range Status   Specimen Description   Final    WOUND Performed at [facility], [address] [place], [place] [address]    Special Requests   Final    FOOT  LEFT Performed at [facility], [address] [place], [place] [address]    Gram Stain RARE WBC SEEN NO ORGANISMS SEEN   Final   Culture   Final    No growth aerobically or anaerobically. Performed at [facility], [address], [place], [place] [address]    Report Status 08/05/2023 FINAL  Final    Labs: CBC: No results for input(s): "WBC", "NEUTROABS", "HGB", "HCT", "MCV", "PLT" in the last 168  hours.  Basic Metabolic Panel: No results for input(s): "NA", "K", "CL", "CO2", "GLUCOSE", "BUN", "CREATININE", "CALCIUM", "MG", "PHOS"  in the last 168 hours.  Liver Function Tests: No results for input(s): "AST", "ALT", "ALKPHOS", "BILITOT", "PROT", "ALBUMIN" in the last 168 hours.  CBG: No results for input(s): "GLUCAP" in the last 168 hours.   Discharge time spent: greater than 30 minutes.  Signed: Pennie Banter, DO [facility] 08/12/2023

## 2023-08-03 NOTE — TOC Transition Note (Signed)
Transition of Care [facility]) - CM/SW Discharge Note   Patient Details  Name: CORRENE KOEHN MRN: [id number] Date of Birth: [birth year]  Transition of Care [facility]) CM/SW Contact:  Truddie Hidden, RN Phone Number: 08/03/2023, 3:29 PM   Clinical Narrative:    Spoke with patient at the bedside and while her son was on speaker phone. They are agreeable to [facility] and do not have a preference. Patient is requesting on transportation. She was advised resources for transportation was previously added to her AVS. They have been advised the accepting [facility] agency will contact at discharge to schedule her SOC. Patient son will transport her home.   Referral sent to [facility] from [facility]   Request for walker and [facility] sent to Jon from [employer]. Patient and son are aware DME will be delivered to the room.           Patient Goals and CMS Choice      Discharge Placement                         Discharge Plan and Services Additional resources added to the After Visit Summary for                                       Social Determinants of Health (SDOH) Interventions SDOH Screenings   Tobacco Use: Low Risk  (07/30/2023)     Readmission Risk Interventions     No data to display

## 2023-08-03 NOTE — Inpatient Diabetes Management (Signed)
Inpatient Diabetes Program Recommendations  AACE/ADA: New Consensus Statement on Inpatient Glycemic Control   Target Ranges:  Prepandial:   less than 140 mg/dL      Peak postprandial:   less than 180 mg/dL (1-2 hours)      Critically ill patients:  140 - 180 mg/dL    Latest Reference Range & Units 08/02/23 11:55 08/02/23 12:50 08/02/23 14:30 08/02/23 16:37 08/02/23 20:41 08/03/23 08:29 08/03/23 12:04  Glucose-Capillary 70 - 99 mg/dL 865 (H) 784 (H) 696 (H) 181 (H) 258 (H) 232 (H) 262 (H)    Review of Glycemic Control  Diabetes history: DM2 Outpatient Diabetes medications: NPH 36 units BID, Regular 0-22 units TID with meals, Metformin XR 500 mg BID, Ozempic 1 mg Qweek Current orders for Inpatient glycemic control: Semglee 10 units daily, Novolog 0-9 units TID with meals  Inpatient Diabetes Program Recommendations:    Insulin discharge recommendations:  Based on glucose trends as inpatient, would recommend to discharge on NPH 8 units BID and Regular 5 units TID with meals. Strongly recommend patient reach out to Endocrinologist for further insulin adjustments based on glucose trends at home.   NOTE: Received chat message from Dr. Denton Lank regarding discharge recommendations for insulin using patient's home insulin of NPH and Regular.   Thanks, Orlando Penner, RN, MSN, CDCES Diabetes Coordinator Inpatient Diabetes Program [phone] (Team Pager from 8am to 5pm)

## 2023-08-03 NOTE — Progress Notes (Addendum)
Patient is not able to walk the distance required to go the bathroom, or he/she is unable to safely negotiate stairs required to access the bathroom.  A BSC will alleviate this problem.

## 2023-08-03 NOTE — Care Management Important Message (Signed)
Important Message  Patient Details  Name: Betty [name] MRN: [id number] Date of Birth: November 13, 1955   Important Message Given:  Yes - Medicare IM     Truddie Hidden, RN 08/03/2023, 2:01 PM

## 2023-08-03 NOTE — Progress Notes (Signed)
   Subjective: 1 Day Post-Op Procedure(s) (LRB): Lower Extremity Angiography (Left) DOS: 07/31/2023.  Dr. Ovid Curd DPM  Patient doing well.  Resting comfortably.  Objective: Vital signs in last 24 hours: Temp:  [97.5 F (36.4 C)-98.1 F (36.7 C)] 98.1 F (36.7 C) (10/22 0826) Pulse Rate:  [58-71] 71 (10/22 0826) Resp:  [13-21] 16 (10/22 0826) BP: (102-155)/(51-94) 155/71 (10/22 0826) SpO2:  [94 %-99 %] 97 % (10/22 0826)  Recent Labs    08/01/23 0440 08/02/23 0451  HGB 11.2* 11.5*   Recent Labs    08/01/23 0440 08/02/23 0451  WBC 10.1 9.0  RBC 3.80* 4.00  HCT 32.9* 34.3*  PLT 304 294   Recent Labs    08/02/23 0451 08/03/23 0612  NA 134*  --   K 4.0  --   CL 103  --   CO2 23  --   BUN 14  --   CREATININE 0.67 0.65  GLUCOSE 292*  --   CALCIUM 8.8*  --    No results for input(s): "LABPT", "INR" in the last 72 hours.  Physical Exam: Amputation site appears stable.  Incisions are well coapted with sutures intact.  Significant reduction of the erythema and edema around the amputation site.  No drainage.  Assessment/Plan: 1 Day Post-Op Procedure(s) (LRB): Lower Extremity Angiography (Left) LT great toe amputation DOS: 07/31/2023  -Patient seen at bedside -Dressings changed -Minimal WBAT surgical shoe with the assistance of a cane mostly for transition purposes only -Based on clinical exam it appears that the cellulitis and erythema to the foot is mostly resolved.  No need for p.o. antibiotics post discharge currently -Patient requesting home health care for dressing changes: Betadine wet-to-dry gauze and 4" Ace wrap -Follow-up 1 week post discharge in office -From a podiatry standpoint patient okay for discharge.  Will sign off  Felecia Shelling 08/03/2023, 1:01 PM  Felecia Shelling, DPM [facility]  Dr. Felecia Shelling, DPM    [address] [place], [place] [address]                Office [phone]  Fax [phone]

## 2023-08-03 NOTE — Progress Notes (Signed)
Physical Therapy Treatment Patient Details Name: Betty [name] MRN: [id number] DOB: 1956/05/01 Today's Date: 08/03/2023   History of Present Illness Betty [name] is a 67yoF who comes to [facility] on 07/29/23 after recent podiatry debridement of 49mo hallux ulcer, podiatry concerned for progression to osteo, recommended ABI, MRI. PMH: DM c diabetic peripheral neuropathy, HTN, HLD, CAD with DES, morbid obesity, OSA not on CPAP, vertigo. Pt went for Left hallux disarticulation at [facility] on 10/19, orders for WBAT in surgical shoe although limited.    PT Comments  Pt received in bed with son at beside and agreed to PT session. Pt reports that pain has been present on a scale of 5/10 and reaches 9/10 at most within LLE. Pt sat EOB from supine supervision with the use of bed railing. While at EOB, pt donned right shoe and needed assistance with donning left post-op shoe. From EOB, pt performed STS and step pivot supervision to wheelchair prior to doing stair negotiation. Pt used step-to-pattern with stair negotiation prior to advancing to the next step. Pt used right hand rail and ascended/descended stairs sideways/diagonally. Pt amb to room CGA without AD to end session in bed. Pt tolerated Tx well and will continue to benefit from skilled PT sessions following d/c to improve strength, gait, and functional mobility.    If plan is discharge home, recommend the following: A little help with walking and/or transfers;A little help with bathing/dressing/bathroom;Assist for transportation;Assistance with cooking/housework;Help with stairs or ramp for entrance   Can travel by private vehicle        Equipment Recommendations  None recommended by PT    Recommendations for Other Services       Precautions / Restrictions Precautions Precautions: Fall Restrictions Weight Bearing Restrictions: Yes LLE Weight Bearing: Weight bearing as tolerated Other Position/Activity Restrictions: to wear L shoe for  offloading     Mobility  Bed Mobility Overal bed mobility: Needs Assistance Bed Mobility: Supine to Sit     Supine to sit: Supervision     General bed mobility comments: Pt performed bed mobility with the use of bed railings, supervision.    Transfers Overall transfer level: Modified independent Equipment used: None               General transfer comment: Pt performed STS with supervision at EOB. Pt then performed a step pivot transfer to sit in wheelchair supervision.    Ambulation/Gait Ambulation/Gait assistance: Contact guard assist Gait Distance (Feet): 100 Feet Assistive device: None Gait Pattern/deviations: Step-through pattern Gait velocity: WNL     General Gait Details: Pt amb from stairs back to room CGA without the use of AD. Pt does not report an increase in pain during or after activity.   Stairs Stairs: Yes Stairs assistance: Contact guard assist Stair Management: One rail Right, Step to pattern, Sideways Number of Stairs: 14 General stair comments: Pt performed stair negotiation CGA with the use of railing. Pt reports that this technique is how she has been previously performing stair negotiation prior to hospital admission.   Merchant [employer] officer mobility: Yes Wheelchair Assistance Details (indicate cue type and reason): Pt was brought to stair case in wheelchair for preservation purposes. Pt then amb back to room CGA after performing stair negotiation.   Tilt Bed    Modified Rankin (Stroke Patients Only)       Balance Overall balance assessment: Modified Independent, No apparent balance deficits (not formally assessed)  Cognition Arousal: Alert Behavior During Therapy: WFL for tasks assessed/performed Overall Cognitive Status: Within Functional Limits for tasks assessed                                 General Comments: AOx4. Pt  pleasant and willing to participate in PT session.        Exercises      General Comments        Pertinent Vitals/Pain Pain Assessment Pain Assessment: 0-10 Pain Score: 5  Pain Location: LLE Pain Descriptors / Indicators: Aching, Dull Pain Intervention(s): Monitored during session    Home Living                          Prior Function            PT Goals (current goals can now be found in the care plan section) Acute Rehab PT Goals PT Goal Formulation: With patient Time For Goal Achievement: 08/15/23 Potential to Achieve Goals: Good Progress towards PT goals: Progressing toward goals    Frequency    Min 1X/week      PT Plan      Co-evaluation              AM-PAC PT "6 Clicks" Mobility   Outcome Measure  Help needed turning from your back to your side while in a flat bed without using bedrails?: None Help needed moving from lying on your back to sitting on the side of a flat bed without using bedrails?: None Help needed moving to and from a bed to a chair (including a wheelchair)?: A Little Help needed standing up from a chair using your arms (e.g., wheelchair or bedside chair)?: A Little Help needed to walk in hospital room?: A Little Help needed climbing 3-5 steps with a railing? : A Little 6 Click Score: 20    End of Session Equipment Utilized During Treatment: Gait belt Activity Tolerance: Patient tolerated treatment well;No increased pain Patient left: in bed;with call bell/phone within reach;with bed alarm set;with nursing/sitter in room;with family/visitor present Nurse Communication: Mobility status PT Visit Diagnosis: Difficulty in walking, not elsewhere classified (R26.2);Other abnormalities of gait and mobility (R26.89);Muscle weakness (generalized) (M62.81)     Time: 4696-2952 PT Time Calculation (min) (ACUTE ONLY): 30 min  Charges:    $Gait Training: 23-37 mins PT General Charges $$ ACUTE PT VISIT: 1 Visit                      Reha Martinovich Sauvignon Howard SPT, LAT, ATC    Alverda Nazzaro Sauvignon-Howard 08/03/2023, 12:47 PM

## 2023-08-03 NOTE — Plan of Care (Signed)
IV removed, discharge instructions reviewed, BSC and walker delivered to room, patient discharged to home with son

## 2023-08-04 ENCOUNTER — Telehealth: Payer: Self-pay

## 2023-08-04 LAB — CULTURE, BLOOD (ROUTINE X 2)
Culture: NO GROWTH
Culture: NO GROWTH
Special Requests: ADEQUATE
Special Requests: ADEQUATE

## 2023-08-04 LAB — SURGICAL PATHOLOGY

## 2023-08-04 NOTE — Telephone Encounter (Signed)
Patient and her son Betty [name] has been advised and voices understanding. They will call us on Friday

## 2023-08-04 NOTE — Telephone Encounter (Signed)
Patient called and left a message stating she has been in the hospital for a Toe infection. They did amputate her toe. She also mentioned that they decreased her medications.  Thursday blood sugar: 77 before hospital Thursday Blood sugar :53 then dropped to 44 at the hospital after eating  Blood sugar this morning: 298 at 9:50am before taking 5 units of R and 8 units of N all of this was without eating.   Changes from the hospital are:  Novolin R 5 units TID  Novolin N :8 units BID   Pt would like to know when you want to see her, they told her she needs to have a f/u with you and she also wants to know how she needs to take her insulin.

## 2023-08-04 NOTE — Telephone Encounter (Signed)
J, In the hospital, because patients are not eating as they are at home, they need less insulin.  It looks like after she went home, she will need to increase the dose of.  I would suggest the following: Novolin R 5 >> 7-10 units TID  Novolin N 8 >> 10 units BID  If the sugars remain elevated after this, she will need to continue to increase the doses depending on the levels.  Please let me know about the sugars before the weekend.

## 2023-08-05 LAB — AEROBIC/ANAEROBIC CULTURE W GRAM STAIN (SURGICAL/DEEP WOUND)

## 2023-08-06 NOTE — Telephone Encounter (Signed)
Call attempted to patient(vm box is full) [phone] Called the Betty [name] he has been notified and voices understanding.

## 2023-08-06 NOTE — Telephone Encounter (Signed)
Patient and her son Lennox Grumbles called back with an update on her sugars after doing the regiment mentioned previously.  On yesterday (10/24) 11 am: 253 3 pm: 271 8:30 pm:350 11:30 pm: 344  All of those readings were obtained while taking 10 units of Novolin N and Novolin R This morning (10/25)\ Fasting 269

## 2023-08-11 ENCOUNTER — Encounter: Payer: Self-pay | Admitting: Podiatry

## 2023-08-11 ENCOUNTER — Ambulatory Visit (INDEPENDENT_AMBULATORY_CARE_PROVIDER_SITE_OTHER): Payer: Medicare Other | Admitting: Podiatry

## 2023-08-11 VITALS — Ht 67.0 in | Wt 262.0 lb

## 2023-08-11 DIAGNOSIS — M869 Osteomyelitis, unspecified: Secondary | ICD-10-CM | POA: Diagnosis not present

## 2023-08-11 NOTE — Progress Notes (Signed)
  Subjective:  Patient ID: Betty [name], female    DOB: [birth year],  MRN: [id number]  Chief Complaint  Patient presents with   Wound Check    Patient is here for hospital F/U for diabetic foot ulcer of great left toe    DOS: 07/31/2023 Procedure: Left hallux amputation  67 y.o. female returns for post-op check.  She is doing well not having much pain, has been using the surgical shoe and a home nurse has been coming a couple times a week to evaluate the wound  Review of Systems: Negative except as noted in the HPI. Denies N/V/F/Ch.   Objective:  There were no vitals filed for this visit. Body mass index is 41.04 kg/m. Constitutional Well developed. Well nourished.  Vascular Foot warm and well perfused. Capillary refill normal to all digits.  Calf is soft and supple, no posterior calf or knee pain, negative Homans' sign  Neurologic Normal speech. Oriented to person, place, and time. Epicritic sensation to light touch grossly reduced bilaterally.  Dermatologic Skin healing well without signs of infection. Skin edges well coapted without signs of infection.  Orthopedic: No pain to palpation noted about the surgical site.    Assessment:   1. Osteomyelitis of great toe of left foot (HCC)    Plan:  Patient was evaluated and treated and all questions answered.  S/p foot surgery left -Progressing as expected post-operatively.  Incision is healing well.  Continue weightbearing in surgical shoe.  Limited weightbearing out of the shoe if necessary at night is okay discussed with her to try to focus weight on the heel and use cane for balance.  Return to see me in 2 weeks to have sutures removed.  She will need to be scheduled to have a fitting for diabetic shoes and a shoe filler as well due to her risk of amputation.   Return in about 2 weeks (around 08/25/2023) for suture removal.

## 2023-08-12 ENCOUNTER — Encounter: Payer: Self-pay | Admitting: Internal Medicine

## 2023-08-25 ENCOUNTER — Ambulatory Visit: Payer: Federal, State, Local not specified - PPO | Admitting: Podiatry

## 2023-08-27 ENCOUNTER — Ambulatory Visit (INDEPENDENT_AMBULATORY_CARE_PROVIDER_SITE_OTHER): Payer: Medicare Other

## 2023-08-27 ENCOUNTER — Encounter: Payer: Self-pay | Admitting: Podiatry

## 2023-08-27 ENCOUNTER — Telehealth: Payer: Self-pay | Admitting: Cardiovascular Disease

## 2023-08-27 ENCOUNTER — Ambulatory Visit (INDEPENDENT_AMBULATORY_CARE_PROVIDER_SITE_OTHER): Payer: Medicare Other | Admitting: Podiatry

## 2023-08-27 DIAGNOSIS — L03032 Cellulitis of left toe: Secondary | ICD-10-CM

## 2023-08-27 DIAGNOSIS — Z9889 Other specified postprocedural states: Secondary | ICD-10-CM

## 2023-08-27 DIAGNOSIS — M869 Osteomyelitis, unspecified: Secondary | ICD-10-CM | POA: Diagnosis not present

## 2023-08-27 MED ORDER — CARVEDILOL 3.125 MG PO TABS: 3.1250 mg | ORAL_TABLET | Freq: Two times a day (BID) | ORAL | 0 refills | Status: DC

## 2023-08-27 MED ORDER — CLINDAMYCIN HCL 300 MG PO CAPS: 300.0000 mg | ORAL_CAPSULE | Freq: Three times a day (TID) | ORAL | 0 refills | Status: DC

## 2023-08-27 NOTE — Progress Notes (Signed)
Chief Complaint  Patient presents with   Routine Post Op    "It's okay.  I was scheduled to have an appointment for shoes on Dec. 13 but my son lives in [place]  When I was talking to the receptionist, she said I might be able to get in today."    Subjective:  Patient presents today status post amputation of the left great toe performed inpatient by Dr. Ardelle Anton on 07/31/2023.  Patient doing well.  She is minimally WBAT in surgical shoe as instructed.  No new complaints  Past Medical History:  Diagnosis Date   Arthritis    CAD, NATIVE VESSEL    a. S/p LAD stent in 2006 b. S/p DES to Lcx 08/2019 and patent LAD stent   DM (diabetes mellitus) (HCC)    Fibromyalgia    HYPERLIPIDEMIA-MIXED    HYPERTENSION, BENIGN    Hypoglycemia 07/29/2023   Neuropathy    Obesity    Osteoarthritis    Sleep apnea    Patient reported on 04/12/13   Vertigo     Past Surgical History:  Procedure Laterality Date   AMPUTATION TOE Left 07/31/2023   Procedure: LEFT HALLUX AMPUTATION;  Surgeon: Vivi Barrack, DPM;  Location: [facility] ORS;  Service: Orthopedics/Podiatry;  Laterality: Left;   BREATH TEK H PYLORI N/A 03/08/2013   Procedure: BREATH TEK H PYLORI;  Surgeon: Atilano Ina, MD;  Location: [facility];  Service: General;  Laterality: N/A;   CESAREAN SECTION     CHOLECYSTECTOMY  1996   CORONARY ANGIOPLASTY WITH STENT PLACEMENT  11/2004   DES - LAD   CORONARY STENT INTERVENTION N/A 09/06/2019   Procedure: CORONARY STENT INTERVENTION;  Surgeon: Tonny Bollman, MD;  Location: [facility] INVASIVE CV LAB;  Service: Cardiovascular;  Laterality: N/A;   CORONARY STENT PLACEMENT  11/2004   LOWER EXTREMITY ANGIOGRAPHY Left 08/02/2023   Procedure: Lower Extremity Angiography;  Surgeon: Annice Needy, MD;  Location: [facility] INVASIVE CV LAB;  Service: Cardiovascular;  Laterality: Left;   RIGHT/LEFT HEART CATH AND CORONARY ANGIOGRAPHY N/A 09/06/2019   Procedure: RIGHT/LEFT HEART CATH AND CORONARY ANGIOGRAPHY;  Surgeon:  Tonny Bollman, MD;  Location: [facility] INVASIVE CV LAB;  Service: Cardiovascular;  Laterality: N/A;    Allergies  Allergen Reactions   Lyrica [Pregabalin] Other (See Comments)    sleepy    08/27/2023 LT foot  Objective/Physical Exam Neurovascular status intact.  Incision well coapted with sutures intact.  There is some localized erythema especially around the second toe of the left foot.  Concern for residual cellulitis.  No open wounds or drainage.  Radiographic Exam LT foot 08/27/2023:  Amputation of the left great toe noted at the level of the MTP.  No osseous erosions or concern for residual osteomyelitis for the moment.    Assessment: 1. s/p amputation left great toe. DOS: 07/31/2023 2.  Surrounding cellulitis around the amputation site as well as the second digit  Plan of Care:  -Patient was evaluated. X-rays reviewed - Sutures removed -Recommend light dressing daily -WBAT surgical shoe -Prescription for clindamycin 300 mg TID #30 due to the erythema and edema especially on the second toe -Return to clinic in 2 weeks.   Felecia Shelling, DPM [facility]  Dr. Felecia Shelling, DPM    [address]  [place], [place] [address]                Office [phone]  Fax [phone]

## 2023-08-27 NOTE — Telephone Encounter (Signed)
*  STAT* If patient is at the pharmacy, call can be transferred to refill team.   1. Which medications need to be refilled? (please list name of each medication and dose if known)  carvedilol (COREG) 3.125 MG tablet  2. Which pharmacy/location (including street and city if local pharmacy) is medication to be sent to? [facility] - [place], [place] - [address] Phone: [phone]  Fax: [phone]   3. Do they need a 30 day or 90 day supply? 90

## 2023-08-27 NOTE — Telephone Encounter (Signed)
Pt's medication was sent to pt's pharmacy as requested. Confirmation received.  °

## 2023-08-30 NOTE — Telephone Encounter (Signed)
NA

## 2023-09-07 ENCOUNTER — Telehealth: Payer: Self-pay

## 2023-09-07 ENCOUNTER — Encounter: Payer: Medicare Other | Admitting: Podiatry

## 2023-09-07 NOTE — Telephone Encounter (Signed)
patient called and left a message this morning - the antibiotic she is taking is making her sick  (GI upset). She has an appointment today at 3:00 - she wants to know if she can stop the antibiotics and wants to know if she can see you after Thanksgiving instead of today. Please call the patient to advise and reschedule -Thanks!
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
## 2023-09-17 ENCOUNTER — Encounter: Payer: Federal, State, Local not specified - PPO | Admitting: Podiatry

## 2023-09-22 ENCOUNTER — Ambulatory Visit: Payer: Medicare Other | Admitting: Cardiovascular Disease

## 2023-09-24 ENCOUNTER — Other Ambulatory Visit: Payer: Federal, State, Local not specified - PPO

## 2023-09-25 ENCOUNTER — Inpatient Hospital Stay (HOSPITAL_COMMUNITY)
Admission: EM | Admit: 2023-09-25 | Discharge: 2023-09-27 | DRG: 638 | Discharge status: Against Medical Advice | Payer: Medicare Other | Attending: Internal Medicine | Admitting: Internal Medicine

## 2023-09-25 ENCOUNTER — Encounter (HOSPITAL_COMMUNITY): Payer: Self-pay | Admitting: Emergency Medicine

## 2023-09-25 ENCOUNTER — Telehealth: Payer: Self-pay | Admitting: Podiatry

## 2023-09-25 ENCOUNTER — Emergency Department (HOSPITAL_COMMUNITY): Payer: Medicare Other

## 2023-09-25 ENCOUNTER — Other Ambulatory Visit: Payer: Self-pay | Admitting: Podiatry

## 2023-09-25 DIAGNOSIS — L03032 Cellulitis of left toe: Principal | ICD-10-CM

## 2023-09-25 DIAGNOSIS — M86172 Other acute osteomyelitis, left ankle and foot: Secondary | ICD-10-CM | POA: Diagnosis present

## 2023-09-25 DIAGNOSIS — Z6841 Body Mass Index (BMI) 40.0 and over, adult: Secondary | ICD-10-CM

## 2023-09-25 DIAGNOSIS — E1142 Type 2 diabetes mellitus with diabetic polyneuropathy: Secondary | ICD-10-CM | POA: Diagnosis present

## 2023-09-25 DIAGNOSIS — Z823 Family history of stroke: Secondary | ICD-10-CM | POA: Diagnosis not present

## 2023-09-25 DIAGNOSIS — E785 Hyperlipidemia, unspecified: Secondary | ICD-10-CM | POA: Diagnosis present

## 2023-09-25 DIAGNOSIS — G4733 Obstructive sleep apnea (adult) (pediatric): Secondary | ICD-10-CM | POA: Diagnosis present

## 2023-09-25 DIAGNOSIS — Z8261 Family history of arthritis: Secondary | ICD-10-CM

## 2023-09-25 DIAGNOSIS — I2510 Atherosclerotic heart disease of native coronary artery without angina pectoris: Secondary | ICD-10-CM | POA: Diagnosis present

## 2023-09-25 DIAGNOSIS — Z7982 Long term (current) use of aspirin: Secondary | ICD-10-CM

## 2023-09-25 DIAGNOSIS — M797 Fibromyalgia: Secondary | ICD-10-CM | POA: Diagnosis present

## 2023-09-25 DIAGNOSIS — E11621 Type 2 diabetes mellitus with foot ulcer: Secondary | ICD-10-CM | POA: Diagnosis present

## 2023-09-25 DIAGNOSIS — E1165 Type 2 diabetes mellitus with hyperglycemia: Secondary | ICD-10-CM | POA: Diagnosis present

## 2023-09-25 DIAGNOSIS — E1169 Type 2 diabetes mellitus with other specified complication: Principal | ICD-10-CM | POA: Diagnosis present

## 2023-09-25 DIAGNOSIS — Z794 Long term (current) use of insulin: Secondary | ICD-10-CM

## 2023-09-25 DIAGNOSIS — Z888 Allergy status to other drugs, medicaments and biological substances status: Secondary | ICD-10-CM

## 2023-09-25 DIAGNOSIS — L97521 Non-pressure chronic ulcer of other part of left foot limited to breakdown of skin: Secondary | ICD-10-CM | POA: Diagnosis present

## 2023-09-25 DIAGNOSIS — Z833 Family history of diabetes mellitus: Secondary | ICD-10-CM | POA: Diagnosis not present

## 2023-09-25 DIAGNOSIS — R600 Localized edema: Secondary | ICD-10-CM | POA: Diagnosis present

## 2023-09-25 DIAGNOSIS — Z89412 Acquired absence of left great toe: Secondary | ICD-10-CM

## 2023-09-25 DIAGNOSIS — E1159 Type 2 diabetes mellitus with other circulatory complications: Secondary | ICD-10-CM | POA: Diagnosis present

## 2023-09-25 DIAGNOSIS — Z7984 Long term (current) use of oral hypoglycemic drugs: Secondary | ICD-10-CM

## 2023-09-25 DIAGNOSIS — E162 Hypoglycemia, unspecified: Secondary | ICD-10-CM | POA: Diagnosis present

## 2023-09-25 DIAGNOSIS — Z8249 Family history of ischemic heart disease and other diseases of the circulatory system: Secondary | ICD-10-CM

## 2023-09-25 DIAGNOSIS — Z7985 Long-term (current) use of injectable non-insulin antidiabetic drugs: Secondary | ICD-10-CM

## 2023-09-25 DIAGNOSIS — Z79899 Other long term (current) drug therapy: Secondary | ICD-10-CM

## 2023-09-25 DIAGNOSIS — E782 Mixed hyperlipidemia: Secondary | ICD-10-CM | POA: Diagnosis present

## 2023-09-25 DIAGNOSIS — M869 Osteomyelitis, unspecified: Secondary | ICD-10-CM | POA: Diagnosis present

## 2023-09-25 DIAGNOSIS — Z955 Presence of coronary angioplasty implant and graft: Secondary | ICD-10-CM | POA: Diagnosis not present

## 2023-09-25 DIAGNOSIS — Z9049 Acquired absence of other specified parts of digestive tract: Secondary | ICD-10-CM

## 2023-09-25 DIAGNOSIS — Z5329 Procedure and treatment not carried out because of patient's decision for other reasons: Secondary | ICD-10-CM | POA: Diagnosis not present

## 2023-09-25 DIAGNOSIS — Z8041 Family history of malignant neoplasm of ovary: Secondary | ICD-10-CM | POA: Diagnosis not present

## 2023-09-25 DIAGNOSIS — I10 Essential (primary) hypertension: Secondary | ICD-10-CM | POA: Diagnosis present

## 2023-09-25 DIAGNOSIS — Z7902 Long term (current) use of antithrombotics/antiplatelets: Secondary | ICD-10-CM

## 2023-09-25 LAB — CBC WITH DIFFERENTIAL/PLATELET
Abs Immature Granulocytes: 0.04 10*3/uL (ref 0.00–0.07)
Basophils Absolute: 0 10*3/uL (ref 0.0–0.1)
Basophils Relative: 0 %
Eosinophils Absolute: 0.3 10*3/uL (ref 0.0–0.5)
Eosinophils Relative: 2 %
HCT: 40.8 % (ref 36.0–46.0)
Hemoglobin: 13.1 g/dL (ref 12.0–15.0)
Immature Granulocytes: 0 %
Lymphocytes Relative: 28 %
Lymphs Abs: 3.8 10*3/uL (ref 0.7–4.0)
MCH: 29.4 pg (ref 26.0–34.0)
MCHC: 32.1 g/dL (ref 30.0–36.0)
MCV: 91.7 fL (ref 80.0–100.0)
Monocytes Absolute: 0.9 10*3/uL (ref 0.1–1.0)
Monocytes Relative: 7 %
Neutro Abs: 8.7 10*3/uL — ABNORMAL HIGH (ref 1.7–7.7)
Neutrophils Relative %: 63 %
Platelets: 298 10*3/uL (ref 150–400)
RBC: 4.45 MIL/uL (ref 3.87–5.11)
RDW: 13.2 % (ref 11.5–15.5)
WBC: 13.6 10*3/uL — ABNORMAL HIGH (ref 4.0–10.5)
nRBC: 0 % (ref 0.0–0.2)

## 2023-09-25 LAB — BASIC METABOLIC PANEL
Anion gap: 8 (ref 5–15)
BUN: 14 mg/dL (ref 8–23)
CO2: 24 mmol/L (ref 22–32)
Calcium: 9.3 mg/dL (ref 8.9–10.3)
Chloride: 106 mmol/L (ref 98–111)
Creatinine, Ser: 0.44 mg/dL (ref 0.44–1.00)
GFR, Estimated: >60 mL/min (ref >60)
Glucose, Bld: 86 mg/dL (ref 70–99)
Potassium: 3.8 mmol/L (ref 3.5–5.1)
Sodium: 138 mmol/L (ref 135–145)

## 2023-09-25 LAB — LACTIC ACID, PLASMA: Lactic Acid, Venous: 1.3 mmol/L (ref 0.5–1.9)

## 2023-09-25 MED ORDER — VANCOMYCIN HCL 2000 MG/400ML IV SOLN
2000.0000 mg | Freq: Once | INTRAVENOUS | Status: AC
  Administered 2023-09-25: 2000 mg via INTRAVENOUS
  Filled 2023-09-25: qty 400

## 2023-09-25 MED ORDER — VANCOMYCIN HCL 1750 MG/350ML IV SOLN
1750.0000 mg | INTRAVENOUS | Status: DC
Start: 2023-09-26 — End: 2023-09-27
  Administered 2023-09-26: 1750 mg via INTRAVENOUS
  Filled 2023-09-25: qty 350

## 2023-09-25 MED ORDER — SODIUM CHLORIDE 0.9 % IV SOLN
2.0000 g | Freq: Once | INTRAVENOUS | Status: AC
  Administered 2023-09-26: 2 g via INTRAVENOUS
  Filled 2023-09-25: qty 12.5

## 2023-09-25 NOTE — Telephone Encounter (Signed)
Patient called stating that she took her sock off and her 2nd toe is red and swollen. She does not report any fevers or chills. Her temperature is 98. No open sores she states and she has not seen any drainage. She is not sure why it is red and swollen. She sent me a picture and the toe is toe is red and swollen  and it looks like it may be going into the foot. I have advised to go to the emergency room. She is going to go to the hospital.

## 2023-09-25 NOTE — Progress Notes (Signed)
error 

## 2023-09-25 NOTE — ED Notes (Signed)
Report given to floor nurse. No further questions.

## 2023-09-25 NOTE — H&P (Signed)
History and Physical    Patient: Betty [name]:[id number] DOB: 1955/12/28 DOA: 09/25/2023 DOS: the patient was seen and examined on 09/25/2023 PCP: Ailene Ravel, MD  Patient coming from: Home  Chief Complaint:  Chief Complaint  Patient presents with   Foot Problem   HPI: Betty [name] is a 66 y.o. female with medical history significant of insulin-dependent diabetes, hyperlipidemia, essential hypertension, osteoarthritis, obstructive sleep apnea, coronary artery disease, history of osteomyelitis of the left big toe diagnosed in October of this year who called her podiatrist Dr. Vaughan Sine today describing that she took her sock off on her second toe is red and swollen.  No fever or chills temperature 98.  No open sores.  She was advised to come to the emergency room.  In the ER initial evaluation including x-ray of the left foot showed evidence of cellulitis but no obvious bony involvement on the x-ray.  There is still high suspicion for another osteomyelitis.  Patient is being admitted for IV antibiotics.  Will also consult her podiatrist.  Review of Systems: As mentioned in the history of present illness. All other systems reviewed and are negative. Past Medical History:  Diagnosis Date   Arthritis    CAD, NATIVE VESSEL    a. S/p LAD stent in 2006 b. S/p DES to Lcx 08/2019 and patent LAD stent   DM (diabetes mellitus) (HCC)    Fibromyalgia    HYPERLIPIDEMIA-MIXED    HYPERTENSION, BENIGN    Hypoglycemia 07/29/2023   Neuropathy    Obesity    Osteoarthritis    Sleep apnea    Patient reported on 04/12/13   Vertigo    Past Surgical History:  Procedure Laterality Date   AMPUTATION TOE Left 07/31/2023   Procedure: LEFT HALLUX AMPUTATION;  Surgeon: Vivi Barrack, DPM;  Location: [facility] ORS;  Service: Orthopedics/Podiatry;  Laterality: Left;   BREATH TEK H PYLORI N/A 03/08/2013   Procedure: BREATH TEK H PYLORI;  Surgeon: Atilano Ina, MD;  Location: [facility];  Service: General;  Laterality: N/A;   CESAREAN SECTION     CHOLECYSTECTOMY  1996   CORONARY ANGIOPLASTY WITH STENT PLACEMENT  11/2004   DES - LAD   CORONARY STENT INTERVENTION N/A 09/06/2019   Procedure: CORONARY STENT INTERVENTION;  Surgeon: Tonny Bollman, MD;  Location: [facility] INVASIVE CV LAB;  Service: Cardiovascular;  Laterality: N/A;   CORONARY STENT PLACEMENT  11/2004   LOWER EXTREMITY ANGIOGRAPHY Left 08/02/2023   Procedure: Lower Extremity Angiography;  Surgeon: Annice Needy, MD;  Location: [facility] INVASIVE CV LAB;  Service: Cardiovascular;  Laterality: Left;   RIGHT/LEFT HEART CATH AND CORONARY ANGIOGRAPHY N/A 09/06/2019   Procedure: RIGHT/LEFT HEART CATH AND CORONARY ANGIOGRAPHY;  Surgeon: Tonny Bollman, MD;  Location: [facility] INVASIVE CV LAB;  Service: Cardiovascular;  Laterality: N/A;   Social History:  reports that she has never smoked. She has never used smokeless tobacco. She reports that she does not drink alcohol and does not use drugs.  Allergies  Allergen Reactions   Lyrica [Pregabalin] Other (See Comments)    sleepy    Family History  Problem Relation Age of Onset   Heart disease Mother    Cancer Mother        ovarian   Arthritis Mother    Diabetes Mother    Hypertension Mother    Hypertension Father    Stroke Father    Diabetes Sister    Cancer Other    Coronary artery disease Other  Diabetes Other     Prior to Admission medications   Medication Sig Start Date End Date Taking? Authorizing Provider  acetaminophen (TYLENOL) 650 MG CR tablet Take 650-1,300 mg by mouth every 8 (eight) hours as needed for pain.    Yes [provider]  aspirin EC 81 MG tablet Take 81 mg by mouth daily.   Yes [provider]  beta carotene 52841 UNIT capsule Take 25,000 Units by mouth daily.     Yes [provider]  carvedilol (COREG) 3.125 MG tablet Take 1 tablet (3.125 mg total) by mouth 2 (two) times daily with a meal. 08/27/23  Yes Wendall Stade, MD  Cholecalciferol (VITAMIN D3) 250 MCG (10000 UT) capsule Take 10,000 Units by mouth 2 (two) times daily.   Yes [provider]  clopidogrel (PLAVIX) 75 MG tablet TAKE ONE TABLET BY MOUTH EVERY DAY 03/22/23  Yes Wendall Stade, MD  gabapentin (NEURONTIN) 100 MG capsule TAKE 1 CAPSULE BY MOUTH TWICE DAILY Patient taking differently: Take 100 mg by mouth at bedtime. TAKE 1 CAPSULE BY MOUTH TWICE DAILY 01/04/23  Yes Carlus Pavlov, MD  icosapent Ethyl (VASCEPA) 1 g capsule Take 2 capsules (2 g total) by mouth 2 (two) times daily. Patient taking differently: Take 1 g by mouth 2 (two) times daily. 04/13/22  Yes Wendall Stade, MD  metFORMIN (GLUCOPHAGE-XR) 500 MG 24 hr tablet TAKE 1 TABLET BY MOUTH 3 TIMES DAILY WITH MEALS Patient taking differently: 2 (two) times daily with a meal. TAKE 1 TABLET BY MOUTH 3 TIMES DAILY WITH MEALS 03/05/23  Yes Carlus Pavlov, MD  Misc Natural Products (OSTEO BI-FLEX JOINT SHIELD PO) Take 1 tablet by mouth 2 (two) times daily.   Yes [provider]  MYRBETRIQ 25 MG TB24 tablet Take 25 mg by mouth daily.   Yes [provider]  ramipril (ALTACE) 10 MG capsule TAKE 1 CAPSULE BY MOUTH DAILY 01/26/23  Yes Carlus Pavlov, MD  clindamycin (CLEOCIN) 300 MG capsule Take 1 capsule (300 mg total) by mouth 3 (three) times daily. 08/27/23   Felecia Shelling, DPM  Continuous Blood Gluc Sensor (FREESTYLE LIBRE 3 SENSOR) MISC 1 each by Does not apply route every 14 (fourteen) days. 10/01/22   Carlus Pavlov, MD  estradiol (ESTRACE) 0.1 MG/GM vaginal cream Place 0.25 Applicatorfuls vaginally 3 (three) times a week. 02/10/23 02/10/24  Linzie Collin, MD  Homeopathic Products [facility] RELIEF EX) Apply 1 application topically 3 (three) times daily as needed (pain.).    [provider]  HYDROcodone-acetaminophen (NORCO/VICODIN) 5-325 MG tablet Take 1 tablet by mouth every 6 (six) hours as needed for severe pain (pain score 7-10) or  moderate pain (pain score 4-6). 08/03/23   Esaw Grandchild A, DO  insulin NPH Human (NOVOLIN N) 100 UNIT/ML injection INJECT 8 UNITS SUBCUTANEOUSLY TWO TIMES A DAY 08/03/23   Esaw Grandchild A, DO  insulin regular (NOVOLIN R) 100 units/mL injection INJECT 5 units into the skin THREE TIMES daily 08/03/23   Esaw Grandchild A, DO  Insulin Syringe-Needle U-100 (BD VEO INSULIN SYRINGE U/F) 31G X 15/64" 1 ML MISC USE 4 TIMES DAILY AS DIRECTED 03/01/23   Carlus Pavlov, MD  latanoprost (XALATAN) 0.005 % ophthalmic solution Apply to eye. 09/14/22   [provider]  Magnesium Oxide -Mg Supplement ([employer] MAGNESIUM OXIDE) 250 MG TABS Take by mouth.    [provider]  Menthol, Topical Analgesic, (ZIMS MAX-FREEZE EX) Apply 1 application topically 4 (four) times daily as needed (  pain).    [provider]  nitroGLYCERIN (NITROSTAT) 0.4 MG SL tablet PLACE 1 TABLET UNDER THE TONGUE EVERY 5 MINUTES AS NEEDED FOR CHEST PAIN UP TO 3 DOSES, IF SYMPTOMS PERSIST CALL 911 12/05/21   Wendall Stade, MD  OZEMPIC, 1 MG/DOSE, 4 MG/3ML SOPN INJECT 1MG  SUBCUTANEOUSLY  ONCE A WEEK 05/24/23   Carlus Pavlov, MD  rosuvastatin (CRESTOR) 10 MG tablet Take 1 tablet (10 mg total) by mouth daily. 12/31/22   Carlus Pavlov, MD  ULTRATRAK PRO TEST test strip TEST BLOOD SUGAR 5 TIMES DAILY AS INSTRUCTED 10/10/18   Carlus Pavlov, MD    Physical Exam: Vitals:   09/25/23 2058 09/25/23 2115 09/25/23 2200  BP: (!) 172/76 (!) 164/64   Pulse: 71 70   Resp: 17    Temp: 97.6 F (36.4 C)    TempSrc: Oral    SpO2: 95% 96%   Weight:   118.8 kg   Constitutional: Acutely ill looking, obese, no distress NAD, calm, comfortable Eyes: PERRL, lids and conjunctivae normal ENMT: Mucous membranes are moist. Posterior pharynx clear of any exudate or lesions.Normal dentition.  Neck: normal, supple, no masses, no thyromegaly Respiratory: clear to auscultation bilaterally, no wheezing, no crackles. Normal respiratory  effort. No accessory muscle use.  Cardiovascular: Regular rate and rhythm, no murmurs / rubs / gallops. No extremity edema. 2+ pedal pulses. No carotid bruits.  Abdomen: no tenderness, no masses palpated. No hepatosplenomegaly. Bowel sounds positive.  Musculoskeletal: Left foot swelling especially second toe, ray warm and tender, Skin: no rashes, lesions, ulcers. No induration Neurologic: CN 2-12 grossly intact. Sensation intact, DTR normal. Strength 5/5 in all 4.  Psychiatric: Normal judgment and insight. Alert and oriented x 3. Normal mood  Data Reviewed:  Blood pressure 172/76, glucose 174, CRP 2.5 white count 13.6 x-ray of the left foot showed soft tissue swelling and subcutaneous gas within the first webspace and subjacent to the second proximal phalanx which may relate to aggressive infection or local trauma hemoglobin 11.5  Assessment and Plan:  #1 cellulitis and possible osteomyelitis of the left second toe: Patient will be admitted.  Initiate IV vancomycin, cefepime and Flagyl.  Will continue supportive care.  Blood cultures.  Podiatry to follow.  #2 uncontrolled diabetes: Initiate sliding scale insulin.  Possibly Lantus.  Patient on NPH at home.  #3 essential hypertension: Blood pressure appears controlled.  Continue home regimen.  #4 hyperlipidemia: Continue with statin.  #5 diabetic neuropathy: Continue with gabapentin  #6 morbid obesity: Dietary counseling  #7 coronary artery disease: Stable at baseline.    Advance Care Planning:   Code Status: Full Code   Consults: Podiatrist Dr. Ardelle Anton  Family Communication: No family at bedside  Severity of Illness: The appropriate patient status for this patient is INPATIENT. Inpatient status is judged to be reasonable and necessary in order to provide the required intensity of service to ensure the patient's safety. The patient's presenting symptoms, physical exam findings, and initial radiographic and laboratory data in the  context of their chronic comorbidities is felt to place them at high risk for further clinical deterioration. Furthermore, it is not anticipated that the patient will be medically stable for discharge from the hospital within 2 midnights of admission.   * I certify that at the point of admission it is my clinical judgment that the patient will require inpatient hospital care spanning beyond 2 midnights from the point of admission due to high intensity of service, high risk for further deterioration and high  frequency of surveillance required.*  AuthorLonia Blood, MD 09/25/2023 10:49 PM  For on call review [email or web address].

## 2023-09-25 NOTE — ED Triage Notes (Addendum)
Pt from home with swelling swelling of L index toe. Great toe amputated in Oct. She was started on clindamycin and cephalexin on Nov 15th. She finished that course but it is worsening. Surgeon was notified and recommended she come to hospital. Pt is not tacy or hypotensive or febrile. Walks with cane and has surgical shoe.

## 2023-09-25 NOTE — ED Provider Notes (Signed)
[place] [facility]ERGENCY DE[facility]MENT AT [facility] Provider Note   CSN: [id number] Arrival date & time: 09/25/23  2035     History  Chief Complaint  Patient presents with  . Foot Problem    CELICA PURINTON is a 67 y.o. female.  67 year old female presenting emergency department for redness and swelling to her left second toe.  She had great toe amputated in October.  On follow-up visit to her podiatrist was noted to have some redness to the left toe was started on antibiotics, but stopped secondary to gastrointestinal/side effects.  He has not been on antibiotics for the past week and several days.  She does not check her feet daily, but noted today when she looked at her toe that it was bigger and more red than it has ever been.  She reportedly contacted her podiatrist who directed her to the emergency department for evaluation.  Denies any fevers, chills, chest pain, shortness of breath.  No nausea no vomiting or diarrhea.        Home Medications Prior to Admission medications   Medication Sig Start Date End Date Taking? Authorizing Provider  acetaminophen (TYLENOL) 650 MG CR tablet Take 650-1,300 mg by mouth every 8 (eight) hours as needed for pain.     [provider]  aspirin EC 81 MG tablet Take 81 mg by mouth daily.    [provider]  beta carotene 95621 UNIT capsule Take 25,000 Units by mouth daily.      [provider]  carvedilol (COREG) 3.125 MG tablet Take 1 tablet (3.125 mg total) by mouth 2 (two) times daily with a meal. 08/27/23   Wendall Stade, MD  Cholecalciferol (VITAMIN D3) 250 MCG (10000 UT) capsule Take 10,000 Units by mouth daily.    [provider]  clindamycin (CLEOCIN) 300 MG capsule Take 1 capsule (300 mg total) by mouth 3 (three) times daily. 08/27/23   Felecia Shelling, DPM  clopidogrel (PLAVIX) 75 MG tablet TAKE ONE TABLET BY MOUTH EVERY DAY 03/22/23   Wendall Stade, MD  Continuous Blood Gluc Sensor  (FREESTYLE LIBRE 3 SENSOR) MISC 1 each by Does not apply route every 14 (fourteen) days. 10/01/22   Carlus Pavlov, MD  estradiol (ESTRACE) 0.1 MG/GM vaginal cream Place 0.25 Applicatorfuls vaginally 3 (three) times a week. 02/10/23 02/10/24  Linzie Collin, MD  gabapentin (NEURONTIN) 100 MG capsule TAKE 1 CAPSULE BY MOUTH TWICE DAILY 01/04/23   Carlus Pavlov, MD  Homeopathic Products [facility] RELIEF EX) Apply 1 application topically 3 (three) times daily as needed (pain.).    [provider]  HYDROcodone-acetaminophen (NORCO/VICODIN) 5-325 MG tablet Take 1 tablet by mouth every 6 (six) hours as needed for severe pain (pain score 7-10) or moderate pain (pain score 4-6). 08/03/23   Pennie Banter, DO  icosapent Ethyl (VASCEPA) 1 g capsule Take 2 capsules (2 g total) by mouth 2 (two) times daily. Patient taking differently: Take 1 g by mouth 2 (two) times daily. 04/13/22   Wendall Stade, MD  insulin NPH Human (NOVOLIN N) 100 UNIT/ML injection INJECT 8 UNITS SUBCUTANEOUSLY TWO TIMES A DAY 08/03/23   Esaw Grandchild A, DO  insulin regular (NOVOLIN R) 100 units/mL injection INJECT 5 units into the skin THREE TIMES daily 08/03/23   Esaw Grandchild A, DO  Insulin Syringe-Needle U-100 (BD VEO INSULIN SYRINGE U/F) 31G X 15/64" 1 ML MISC USE 4 TIMES DAILY AS DIRECTED 03/01/23   Carlus Pavlov, MD  latanoprost (  XALATAN) 0.005 % ophthalmic solution Apply to eye. 09/14/22   [provider]  Magnesium Oxide -Mg Supplement ([employer] MAGNESIUM OXIDE) 250 MG TABS Take by mouth.    [provider]  Menthol, Topical Analgesic, (ZIMS MAX-FREEZE EX) Apply 1 application topically 4 (four) times daily as needed (pain).    [provider]  metFORMIN (GLUCOPHAGE-XR) 500 MG 24 hr tablet TAKE 1 TABLET BY MOUTH 3 TIMES DAILY WITH MEALS Patient taking differently: 2 (two) times daily with a meal. TAKE 1 TABLET BY MOUTH 3 TIMES DAILY WITH MEALS 03/05/23   Carlus Pavlov, MD  Misc  Natural Products (OSTEO BI-FLEX JOINT SHIELD PO) Take 1 tablet by mouth 2 (two) times daily.    [provider]  MYRBETRIQ 25 MG TB24 tablet Take 25 mg by mouth daily.    [provider]  nitroGLYCERIN (NITROSTAT) 0.4 MG SL tablet PLACE 1 TABLET UNDER THE TONGUE EVERY 5 MINUTES AS NEEDED FOR CHEST PAIN UP TO 3 DOSES, IF SYMPTOMS PERSIST CALL 911 12/05/21   Wendall Stade, MD  OZEMPIC, 1 MG/DOSE, 4 MG/3ML SOPN INJECT 1MG  SUBCUTANEOUSLY  ONCE A WEEK 05/24/23   Carlus Pavlov, MD  ramipril (ALTACE) 10 MG capsule TAKE 1 CAPSULE BY MOUTH DAILY 01/26/23   Carlus Pavlov, MD  rosuvastatin (CRESTOR) 10 MG tablet Take 1 tablet (10 mg total) by mouth daily. 12/31/22   Carlus Pavlov, MD  Sherryl Barters PRO TEST test strip TEST BLOOD SUGAR 5 TIMES DAILY AS INSTRUCTED 10/10/18   Carlus Pavlov, MD      Allergies    Lyrica [pregabalin]    Review of Systems   Review of Systems  Physical Exam Updated Vital Signs BP (!) 164/64   Pulse 70   Temp 97.6 F (36.4 C) (Oral)   Resp 17   SpO2 96%  Physical Exam Vitals and nursing note reviewed.  Constitutional:      General: She is not in acute distress. HENT:     Head: Normocephalic.     Nose: Nose normal.     Mouth/Throat:     Mouth: Mucous membranes are moist.  Eyes:     Conjunctiva/sclera: Conjunctivae normal.  Cardiovascular:     Rate and Rhythm: Normal rate and regular rhythm.  Pulmonary:     Effort: Pulmonary effort is normal.     Breath sounds: Normal breath sounds.  Abdominal:     General: There is no distension.     Tenderness: There is no abdominal tenderness. There is no guarding or rebound.  Musculoskeletal:     Cervical back: Normal range of motion.     Comments: Left foot with amputated great toe.  Second toe with some swelling and erythema.  Has some minor tracking towards the dorsum of the foot.  The distal tip of the toe does have a slight ulceration to it.  No pus or purulence appreciated.  Skin:     General: Skin is warm and dry.     Capillary Refill: Capillary refill takes less than 2 seconds.  Neurological:     Mental Status: She is alert and oriented to person, place, and time.  Psychiatric:        Mood and Affect: Mood normal.        Behavior: Behavior normal.          ED Results / Procedures / Treatments   Labs (all labs ordered are listed, but only abnormal results are displayed) Labs Reviewed  CBC WITH DIFFERENTIAL/PLATELET - Abnormal; Notable for  the following components:      Result Value   WBC 13.6 (*)    Neutro Abs 8.7 (*)    All other components within normal limits  CULTURE, BLOOD (ROUTINE X 2)  CULTURE, BLOOD (ROUTINE X 2)  BASIC METABOLIC PANEL  LACTIC ACID, PLASMA  LACTIC ACID, PLASMA    EKG None  Radiology DG Foot 2 Views Left Result Date: 09/25/2023 CLINICAL DATA:  Left foot infection EXAM: LEFT FOOT - 2 VIEW COMPARISON:  None Available. FINDINGS: Amputation of the left great toe distal to the first metatarsal has been performed. No acute fracture or dislocation. Joint spaces are preserved. No osseous erosions or abnormal periosteal reaction. There is mild soft tissue swelling of the left forefoot and subcutaneous gas seen within the first web space and subjacent to the second proximal phalanx which may relate to aggressive infection or local trauma. No retained radiopaque foreign body. Mild midfoot degenerative arthritis. Small superior and plantar calcaneal spur. Vascular calcifications are noted. IMPRESSION: 1. Soft tissue swelling and subcutaneous gas within the first web space and subjacent to the second proximal phalanx which may relate to aggressive infection or local trauma. No radiographic evidence of osteomyelitis. Electronically Signed   By: Helyn Numbers M.D.   On: 09/25/2023 22:02    Procedures Procedures    Medications Ordered in ED Medications - No data to display  ED Course/ Medical Decision Making/ A&P Clinical Course as of  09/25/23 2234  Sat Sep 25, 2023  2215 DG Foot 2 Views Left IMPRESSION: 1. Soft tissue swelling and subcutaneous gas within the first web space and subjacent to the second proximal phalanx which may relate to aggressive infection or local trauma. No radiographic evidence of osteomyelitis.   [TY]  2217 WBC(!): 13.6 [TY]  2217 Lactic Acid, Venous: 1.3 [TY]  2217 Basic metabolic panel No significant metabolic derangements. [TY]  2223 Lactic Acid, Venous: 1.3 [TY]  2223 Basic metabolic panel No significant metabolic derangements. [TY]  2223 Patient does have leukocytosis with left shift.  She is immunocompromised with diabetes with recent history of osteomyelitis on the same foot.  Had partial treatment with clindamycin outpatient.  Per chart review found foot of the toe from podiatry several weeks ago.  Does appear to be worsening.  Vancomycin ordered.  Will admit for observation until blood cultures can result and possible podiatry evaluation. [TY]  2234 PMH per chart review: ""SOLOMIYA GAIER is a 67 y.o. female with medical history significant of DM, HTN, HLD, CAD with DES, morbid obesity, OSA not on CPAP, vertigo, peripheral neuropathy who presents with left great toe ulcer" [TY]    Clinical Course User Index [TY] Coral Spikes, DO                                 Medical Decision Making Is a 67 year old female with complex past medical history to include obesity, diabetes CAD, hypertension status post toe amputation presenting emergency department with redness and swelling to her left second and third toe.  This is seemingly worsened since she stopped antibiotics over the past week and a half.  Will get broad infectious workup.  Get x-ray to evaluate for osteomyelitis. See ED course for further MDM/Disposition.   Amount and/or Complexity of Data Reviewed External Data Reviewed:     Details: Directed to the emergency department by podiatry, Dr. Loreta Ave. Labs: ordered.  Decision-making details documented in ED Course. Radiology:  ordered. Decision-making details documented in ED Course.  Risk Decision regarding hospitalization.          Final Clinical Impression(s) / ED Diagnoses Final diagnoses:  Cellulitis of toe of left foot    Rx / DC Orders ED Discharge Orders     None         Coral Spikes, DO 09/25/23 2224

## 2023-09-25 NOTE — Progress Notes (Signed)
Pharmacy Antibiotic Note  Betty [name] is a 67 y.o. female admitted on 09/25/2023 with redness and swelling to her left second toe. She had great toe amputated in October. .  Pharmacy has been consulted to dose vancomycin and cefepime for osteomyelitis.  Plan: Vancomycin 2gm IV x 1 then 1750mg  q24h (AUC 501.7, used Scr 0.8) Cefepime 2gm IV q8h Follow renal function, cultures and clinical course  Weight: 118.8 kg (261 lb 14.5 oz)  Temp (24hrs), Avg:97.6 F (36.4 C), Min:97.6 F (36.4 C), Max:97.6 F (36.4 C)  Recent Labs  Lab 09/25/23 2050  WBC 13.6*  CREATININE 0.44  LATICACIDVEN 1.3    Estimated Creatinine Clearance: 89.8 mL/min (by C-G formula based on SCr of 0.44 mg/dL).    Allergies  Allergen Reactions   Lyrica [Pregabalin] Other (See Comments)    sleepy    Antimicrobials this admission: 12/14 vanc >> 12/14 cefepime >>  Dose adjustments this admission:   Microbiology results: 12/14 BCx:   Thank you for allowing pharmacy to be a part of this patient's care.  Arley Phenix RPh 09/25/2023, 11:01 PM

## 2023-09-26 ENCOUNTER — Inpatient Hospital Stay (HOSPITAL_COMMUNITY): Payer: Medicare Other

## 2023-09-26 ENCOUNTER — Other Ambulatory Visit: Payer: Self-pay

## 2023-09-26 DIAGNOSIS — L03032 Cellulitis of left toe: Secondary | ICD-10-CM | POA: Diagnosis not present

## 2023-09-26 DIAGNOSIS — M86172 Other acute osteomyelitis, left ankle and foot: Secondary | ICD-10-CM | POA: Diagnosis not present

## 2023-09-26 LAB — CBC
HCT: 37.1 % (ref 36.0–46.0)
HCT: 37.6 % (ref 36.0–46.0)
Hemoglobin: 12.3 g/dL (ref 12.0–15.0)
Hemoglobin: 11.8 g/dL — ABNORMAL LOW (ref 12.0–15.0)
MCH: 30.4 pg (ref 26.0–34.0)
MCH: 29.2 pg (ref 26.0–34.0)
MCHC: 33.2 g/dL (ref 30.0–36.0)
MCHC: 31.4 g/dL (ref 30.0–36.0)
MCV: 91.6 fL (ref 80.0–100.0)
MCV: 93.1 fL (ref 80.0–100.0)
Platelets: 267 10*3/uL (ref 150–400)
Platelets: 265 10*3/uL (ref 150–400)
RBC: 4.05 MIL/uL (ref 3.87–5.11)
RBC: 4.04 MIL/uL (ref 3.87–5.11)
RDW: 13.2 % (ref 11.5–15.5)
RDW: 13.2 % (ref 11.5–15.5)
WBC: 15.2 10*3/uL — ABNORMAL HIGH (ref 4.0–10.5)
WBC: 13 10*3/uL — ABNORMAL HIGH (ref 4.0–10.5)
nRBC: 0 % (ref 0.0–0.2)
nRBC: 0 % (ref 0.0–0.2)

## 2023-09-26 LAB — COMPREHENSIVE METABOLIC PANEL
ALT: 23 U/L (ref 0–44)
AST: 20 U/L (ref 15–41)
Albumin: 3.6 g/dL (ref 3.5–5.0)
Alkaline Phosphatase: 69 U/L (ref 38–126)
Anion gap: 8 (ref 5–15)
BUN: 14 mg/dL (ref 8–23)
CO2: 24 mmol/L (ref 22–32)
Calcium: 8.8 mg/dL — ABNORMAL LOW (ref 8.9–10.3)
Chloride: 106 mmol/L (ref 98–111)
Creatinine, Ser: 0.65 mg/dL (ref 0.44–1.00)
GFR, Estimated: >60 mL/min (ref >60)
Glucose, Bld: 123 mg/dL — ABNORMAL HIGH (ref 70–99)
Potassium: 3.8 mmol/L (ref 3.5–5.1)
Sodium: 138 mmol/L (ref 135–145)
Total Bilirubin: 0.9 mg/dL (ref <1.2)
Total Protein: 6.8 g/dL (ref 6.5–8.1)

## 2023-09-26 LAB — LACTIC ACID, PLASMA: Lactic Acid, Venous: 0.7 mmol/L (ref 0.5–1.9)

## 2023-09-26 LAB — GLUCOSE, CAPILLARY
Glucose-Capillary: 132 mg/dL — ABNORMAL HIGH (ref 70–99)
Glucose-Capillary: 151 mg/dL — ABNORMAL HIGH (ref 70–99)
Glucose-Capillary: 163 mg/dL — ABNORMAL HIGH (ref 70–99)
Glucose-Capillary: 199 mg/dL — ABNORMAL HIGH (ref 70–99)
Glucose-Capillary: 83 mg/dL (ref 70–99)

## 2023-09-26 LAB — CREATININE, SERUM
Creatinine, Ser: 0.58 mg/dL (ref 0.44–1.00)
GFR, Estimated: >60 mL/min (ref >60)

## 2023-09-26 MED ORDER — ACETAMINOPHEN 325 MG PO TABS
650.0000 mg | ORAL_TABLET | Freq: Four times a day (QID) | ORAL | Status: DC | PRN
  Administered 2023-09-26 (×2): 650 mg via ORAL
  Filled 2023-09-26 (×2): qty 2

## 2023-09-26 MED ORDER — INSULIN ASPART 100 UNIT/ML IJ SOLN
0.0000 [IU] | Freq: Every day | INTRAMUSCULAR | Status: DC
  Filled 2023-09-26: qty 0.05

## 2023-09-26 MED ORDER — ENOXAPARIN SODIUM 40 MG/0.4ML IJ SOSY
40.0000 mg | PREFILLED_SYRINGE | INTRAMUSCULAR | Status: DC
  Administered 2023-09-26: 40 mg via SUBCUTANEOUS
  Filled 2023-09-26 (×2): qty 0.4

## 2023-09-26 MED ORDER — OSTEO BI-FLEX ADV TRIPLE ST PO TABS: 1.0000 | ORAL_TABLET | Freq: Two times a day (BID) | ORAL | Status: DC

## 2023-09-26 MED ORDER — ACETAMINOPHEN 325 MG PO TABS
650.0000 mg | ORAL_TABLET | Freq: Four times a day (QID) | ORAL | Status: DC | PRN
  Administered 2023-09-26: 650 mg via ORAL
  Filled 2023-09-26: qty 2

## 2023-09-26 MED ORDER — MIRABEGRON ER 25 MG PO TB24
25.0000 mg | ORAL_TABLET | Freq: Every day | ORAL | Status: DC
  Administered 2023-09-26: 25 mg via ORAL
  Filled 2023-09-26 (×2): qty 1

## 2023-09-26 MED ORDER — CLOPIDOGREL BISULFATE 75 MG PO TABS: 75.0000 mg | ORAL_TABLET | Freq: Every day | ORAL | Status: DC

## 2023-09-26 MED ORDER — GABAPENTIN 100 MG PO CAPS
100.0000 mg | ORAL_CAPSULE | Freq: Two times a day (BID) | ORAL | Status: DC
  Administered 2023-09-26 (×2): 100 mg via ORAL
  Filled 2023-09-26 (×3): qty 1

## 2023-09-26 MED ORDER — INSULIN GLARGINE-YFGN 100 UNIT/ML ~~LOC~~ SOLN
15.0000 [IU] | Freq: Every day | SUBCUTANEOUS | Status: DC
  Administered 2023-09-26: 15 [IU] via SUBCUTANEOUS
  Filled 2023-09-26 (×2): qty 0.15

## 2023-09-26 MED ORDER — SODIUM CHLORIDE 0.9 % IV SOLN
2.0000 g | Freq: Three times a day (TID) | INTRAVENOUS | Status: DC
  Administered 2023-09-26 – 2023-09-27 (×3): 2 g via INTRAVENOUS
  Filled 2023-09-26 (×4): qty 12.5

## 2023-09-26 MED ORDER — INSULIN ASPART 100 UNIT/ML IJ SOLN
0.0000 [IU] | Freq: Three times a day (TID) | INTRAMUSCULAR | Status: DC
  Administered 2023-09-26: 3 [IU] via SUBCUTANEOUS
  Filled 2023-09-26: qty 0.2

## 2023-09-26 MED ORDER — DICLOFENAC SODIUM 1 % EX GEL
4.0000 g | Freq: Four times a day (QID) | CUTANEOUS | Status: DC | PRN
  Administered 2023-09-26 – 2023-09-27 (×2): 4 g via TOPICAL
  Filled 2023-09-26: qty 100

## 2023-09-26 MED ORDER — CARVEDILOL 3.125 MG PO TABS
3.1250 mg | ORAL_TABLET | Freq: Two times a day (BID) | ORAL | Status: DC
  Administered 2023-09-26 (×2): 3.125 mg via ORAL
  Filled 2023-09-26 (×3): qty 1

## 2023-09-26 MED ORDER — RAMIPRIL 5 MG PO CAPS
10.0000 mg | ORAL_CAPSULE | Freq: Every day | ORAL | Status: DC
  Administered 2023-09-26: 10 mg via ORAL
  Filled 2023-09-26 (×2): qty 2

## 2023-09-26 MED ORDER — ASPIRIN 81 MG PO TBEC: 81.0000 mg | DELAYED_RELEASE_TABLET | Freq: Every day | ORAL | Status: DC

## 2023-09-26 MED ORDER — VITAMIN D3 250 MCG (10000 UT) PO CAPS: 10000.0000 [IU] | ORAL_CAPSULE | Freq: Two times a day (BID) | ORAL | Status: DC

## 2023-09-26 MED ORDER — INFLUENZA VAC A&B SURF ANT ADJ 0.5 ML IM SUSY
0.5000 mL | PREFILLED_SYRINGE | Freq: Once | INTRAMUSCULAR | Status: DC
  Filled 2023-09-26: qty 0.5

## 2023-09-26 MED ORDER — ROSUVASTATIN CALCIUM 10 MG PO TABS
10.0000 mg | ORAL_TABLET | Freq: Every day | ORAL | Status: DC
Start: 2023-09-26 — End: 2023-09-27
  Administered 2023-09-26: 10 mg via ORAL
  Filled 2023-09-26 (×2): qty 1

## 2023-09-26 MED ORDER — ICOSAPENT ETHYL 1 G PO CAPS
1.0000 g | ORAL_CAPSULE | Freq: Two times a day (BID) | ORAL | Status: DC
Start: 2023-09-26 — End: 2023-09-27
  Administered 2023-09-26 (×2): 1 g via ORAL
  Filled 2023-09-26 (×3): qty 1

## 2023-09-26 MED ORDER — ONDANSETRON HCL 4 MG PO TABS: 4.0000 mg | ORAL_TABLET | Freq: Four times a day (QID) | ORAL | Status: DC | PRN

## 2023-09-26 MED ORDER — INSULIN ASPART 100 UNIT/ML IJ SOLN: 0.0000 [IU] | Freq: Every day | INTRAMUSCULAR | Status: DC

## 2023-09-26 MED ORDER — MORPHINE SULFATE (PF) 2 MG/ML IV SOLN: 2.0000 mg | INTRAVENOUS | Status: DC | PRN

## 2023-09-26 MED ORDER — LATANOPROST 0.005 % OP SOLN
1.0000 [drp] | Freq: Every day | OPHTHALMIC | Status: DC
  Administered 2023-09-26: 1 [drp] via OPHTHALMIC
  Filled 2023-09-26: qty 2.5

## 2023-09-26 MED ORDER — GADOBUTROL 1 MMOL/ML IV SOLN
10.0000 mL | Freq: Once | INTRAVENOUS | Status: AC | PRN
  Administered 2023-09-26: 10 mL via INTRAVENOUS

## 2023-09-26 MED ORDER — METRONIDAZOLE 500 MG/100ML IV SOLN
500.0000 mg | Freq: Two times a day (BID) | INTRAVENOUS | Status: DC
  Administered 2023-09-26 – 2023-09-27 (×3): 500 mg via INTRAVENOUS
  Filled 2023-09-26 (×3): qty 100

## 2023-09-26 MED ORDER — ONDANSETRON HCL 4 MG/2ML IJ SOLN: 4.0000 mg | Freq: Four times a day (QID) | INTRAMUSCULAR | Status: DC | PRN

## 2023-09-26 MED ORDER — GABAPENTIN 100 MG PO CAPS: 100.0000 mg | ORAL_CAPSULE | Freq: Every day | ORAL | Status: DC

## 2023-09-26 MED ORDER — INSULIN ASPART 100 UNIT/ML IJ SOLN
0.0000 [IU] | Freq: Three times a day (TID) | INTRAMUSCULAR | Status: DC
  Administered 2023-09-26 (×2): 3 [IU] via SUBCUTANEOUS

## 2023-09-26 MED ORDER — LACTATED RINGERS IV SOLN: INTRAVENOUS | Status: DC

## 2023-09-26 NOTE — Progress Notes (Addendum)
PROGRESS NOTE   Betty [name]  WJX:[id number]    DOB: January 11, 1956    DOA: 09/25/2023  PCP: Ailene Ravel, MD   I have briefly reviewed patients previous medical records in [facility].  Chief Complaint  Patient presents with   Foot Problem    Brief Hospital Course:  67 year old female, lives alone, ambulates with the help of walker, medical history significant for Type II DM/IDDM with peripheral neuropathy, HLD, HTN, morbid obesity, OSA not on CPAP, CAD s/p stents, osteomyelitis of left great toe, s/p amputation in October 2024, presented to the ED 09/25/2023 with complaints of left second toe redness and swelling.  X-ray of the left foot negative for osteomyelitis.  Admitted for cellulitis of left second toe with concern for deeper infection.  Podiatry consulted, plan MRI of left foot, continue IV antibiotics, n.p.o. after midnight tonight for consideration for surgical intervention 12/16.   Assessment & Plan:  Principal Problem:   Acute osteomyelitis of toe, left (HCC) Active Problems:   Diabetic foot ulcer_left great toe   Poorly controlled type 2 diabetes mellitus with circulatory disorder (HCC)   CAD, NATIVE VESSEL   Hyperlipidemia   HYPERTENSION, BENIGN   Morbid obesity with BMI of 40.0-44.9, adult (HCC)   Diabetic peripheral neuropathy (HCC)   Osteomyelitis of second toe of left foot (HCC)   Left second toe , left first webspace cellulitis, R/O osteomyelitis: Patient reports protracted course of infection issues of left foot.  Reportedly had been on and off antibiotics since December 2023.  Hospitalized 10/17 - 10/21 and underwent left great toe amputation on 10/19.  Seen by Dr. Logan Bores, podiatry in office on 11/15, noted cellulitis around the amputation site as well as the second digit and prescribed clindamycin for 10 days, unclear how many days she actually took but stopped a week before Thanksgiving due to intolerance/nausea and vomiting. X-ray of left foot:  Soft tissue swelling and subcutaneous gas within the first webspace and subjacent to the second proximal phalanx which may relate to aggressive infection or local exam.  No x-ray evidence of osteomyelitis. Discussed with Dr. Loreta Ave who evaluated her, does not feel she has gas.  Getting MRI of left foot. Recommends continuing IV antibiotics (currently on IV cefepime, vancomycin and metronidazole), n.p.o. after midnight tonight for consideration for surgical intervention 12/16. No pain due to neuropathy. Per Dr. Loreta Ave, misses follow-up office visits due to transportation issues.  Type II DM/IDDM with peripheral neuropathy A1c 7.8 on 06/04/2023.  A1c almost in this range since 2022. At home patient on Novolin and 36 units at bedtime (last dose on 12/14) along with Novolin R sliding scale. Started reduced dose Semglee 15 units daily, continue SSI.   Monitor closely and adjust insulins as needed. Continue gabapentin. Holding Ozempic and metformin.  Essential hypertension: Controlled.  Continue carvedilol and lisinopril  Mixed hyperlipidemia: Continue Vascepa and rosuvastatin.  CAD s/p stents Continue carvedilol and Vascepa.  Resume aspirin and Plavix if okay with podiatry. No angina  OSA not on CPAP  Anemia Follow CBCs  Body mass index is 41.64 kg/m./Morbid obesity Outpatient follow-up   DVT prophylaxis: enoxaparin (LOVENOX) injection 40 mg Start: 09/26/23 1000     Code Status: Full Code:  Family Communication: None at bedside Disposition:  Status is: Inpatient Remains inpatient appropriate because: IV antibiotics, consideration for surgery     Consultants:   Podiatry  Procedures:     Antimicrobials:   As above   Subjective:  Does not have pain due  to peripheral neuropathy and foot.  States that she has redness and swelling of left second toe, noted by her sister who is an [employer] fundraiser and also by home health RN.  The swelling and redness are better compared to yesterday.  No  other complaints reported.  Objective:   Vitals:   09/26/23 0025 09/26/23 0102 09/26/23 0357 09/26/23 0429  BP: (!) 165/79  (!) 113/43 (!) 155/76  Pulse: 70  65 70  Resp: 18  17 18   Temp: (!) 97.5 F (36.4 C)  98.3 F (36.8 C) (!) 97.5 F (36.4 C)  TempSrc: Oral   Oral  SpO2: 100%  97% 100%  Weight:      Height:  5' 6.5" (1.689 m)      General exam: Middle-age female, moderately built and morbidly obese sitting up comfortably in bed Respiratory system: Clear to auscultation. Respiratory effort normal. Cardiovascular system: S1 & S2 heard, RRR. No JVD, murmurs, rubs, gallops or clicks. No pedal edema. Gastrointestinal system: Abdomen is nondistended, soft and nontender. No organomegaly or masses felt. Normal bowel sounds heard. Central nervous system: Alert and oriented. No focal neurological deficits. Extremities: Symmetric 5 x 5 power.  Left great toe amputation site has healed well.  Left second toe with mild to moderate swelling, redness but no fluctuation or crepitus. Skin: No rashes, lesions or ulcers Psychiatry: Judgement and insight appear normal. Mood & affect appropriate.     Data Reviewed:   I have personally reviewed following labs and imaging studies   CBC: Recent Labs  Lab 09/25/23 2050 09/26/23 0047 09/26/23 0342  WBC 13.6* 15.2* 13.0*  NEUTROABS 8.7*  --   --   HGB 13.1 12.3 11.8*  HCT 40.8 37.1 37.6  MCV 91.7 91.6 93.1  PLT 298 267 265    Basic Metabolic Panel: Recent Labs  Lab 09/25/23 2050 09/26/23 0047 09/26/23 0342  NA 138  --  138  K 3.8  --  3.8  CL 106  --  106  CO2 24  --  24  GLUCOSE 86  --  123*  BUN 14  --  14  CREATININE 0.44 0.58 0.65  CALCIUM 9.3  --  8.8*    Liver Function Tests: Recent Labs  Lab 09/26/23 0342  AST 20  ALT 23  ALKPHOS 69  BILITOT 0.9  PROT 6.8  ALBUMIN 3.6    CBG: Recent Labs  Lab 09/26/23 0027 09/26/23 0734  GLUCAP 83 132*    Microbiology Studies:   Recent Results (from the past 240  hours)  Culture, blood (routine x 2)     Status: None (Preliminary result)   Collection Time: 09/25/23  8:50 PM   Specimen: BLOOD  Result Value Ref Range Status   Specimen Description   Final    BLOOD RIGHT ANTECUBITAL Performed at [facility], [address], [place], [place] [address]    Special Requests   Final    BOTTLES DRAWN AEROBIC AND ANAEROBIC Blood Culture results may not be optimal due to an inadequate volume of blood received in culture bottles Performed at [facility], [address], [place], [place] [address]    Culture PENDING  Incomplete   Report Status PENDING  Incomplete    Radiology Studies:  DG Foot 2 Views Left Result Date: 09/25/2023 CLINICAL DATA:  Left foot infection EXAM: LEFT FOOT - 2 VIEW COMPARISON:  None Available. FINDINGS: Amputation of the left great toe distal to the first metatarsal has been  performed. No acute fracture or dislocation. Joint spaces are preserved. No osseous erosions or abnormal periosteal reaction. There is mild soft tissue swelling of the left forefoot and subcutaneous gas seen within the first web space and subjacent to the second proximal phalanx which may relate to aggressive infection or local trauma. No retained radiopaque foreign body. Mild midfoot degenerative arthritis. Small superior and plantar calcaneal spur. Vascular calcifications are noted. IMPRESSION: 1. Soft tissue swelling and subcutaneous gas within the first web space and subjacent to the second proximal phalanx which may relate to aggressive infection or local trauma. No radiographic evidence of osteomyelitis. Electronically Signed   By: Helyn Numbers M.D.   On: 09/25/2023 22:02    Scheduled Meds:    carvedilol  3.125 mg Oral BID WC   enoxaparin (LOVENOX) injection  40 mg Subcutaneous Q24H   gabapentin  100 mg Oral BID   [START ON 09/27/2023] influenza vaccine adjuvanted  0.5 mL Intramuscular Once   insulin aspart  0-20 Units Subcutaneous  TID WC   insulin aspart  0-5 Units Subcutaneous QHS   ramipril  10 mg Oral Daily    Continuous Infusions:    ceFEPime (MAXIPIME) IV 2 g (09/26/23 0827)   lactated ringers 40 mL/hr at 09/26/23 0114   metronidazole 500 mg (09/26/23 0202)   vancomycin       LOS: 1 day     Marcellus Scott, MD,  FACP, [facility], [facility], [facility]   [facility] [place]      To contact the attending provider between 7A-7P or the covering provider during after hours 7P-7A, please log into the web site [email or web address] and access using universal [place] password for that web site. If you do not have the password, please call the hospital operator.  09/26/2023, 8:48 AM

## 2023-09-26 NOTE — Consult Note (Signed)
Reason for Consult: Cellulitis  Referring Physician: Dr. Marcellus Scott, MD  Betty [name] is an 67 y.o. female.  HPI:  67 y.o. female with medical history significant of insulin-dependent diabetes, hyperlipidemia, essential hypertension, osteoarthritis, obstructive sleep apnea, coronary artery disease, history of osteomyelitis of the left big toe. She called me yesterday afternoon (and sent a picture) that her toe was red and swollen. She states it has been swo and was previously placed on antibiotics but caused GI upset so she discontinued this.  He currently feels well she does not report any fevers or chills.  She has no pain to the foot although she does have neuropathy.  She says that she has not seen a wound on her toe.  Overall she feels well.  Past Medical History:  Diagnosis Date   Arthritis    CAD, NATIVE VESSEL    a. S/p LAD stent in 2006 b. S/p DES to Lcx 08/2019 and patent LAD stent   DM (diabetes mellitus) (HCC)    Fibromyalgia    HYPERLIPIDEMIA-MIXED    HYPERTENSION, BENIGN    Hypoglycemia 07/29/2023   Neuropathy    Obesity    Osteoarthritis    Sleep apnea    Patient reported on 04/12/13   Vertigo     Past Surgical History:  Procedure Laterality Date   AMPUTATION TOE Left 07/31/2023   Procedure: LEFT HALLUX AMPUTATION;  Surgeon: Vivi Barrack, DPM;  Location: [facility] OR[facility];  Service: Orthopedics/Podiatry;  Laterality: Left;   BREATH TEK H PYLORI N/A 03/08/2013   Procedure: BREATH TEK H PYLORI;  Surgeon: Atilano Ina, MD;  Location: [facility];  Service: General;  Laterality: N/A;   CESAREAN SECTION     CHOLECYSTECTOMY  1996   CORONARY ANGIOPLASTY WITH STENT PLACEMENT  11/2004   DES - LAD   CORONARY STENT INTERVENTION N/A 09/06/2019   Procedure: CORONARY STENT INTERVENTION;  Surgeon: Tonny Bollman, MD;  Location: [facility] INVASIVE CV LAB;  Service: Cardiovascular;  Laterality: N/A;   CORONARY STENT PLACEMENT  11/2004   LOWER EXTREMITY ANGIOGRAPHY Left 08/02/2023    Procedure: Lower Extremity Angiography;  Surgeon: Annice Needy, MD;  Location: [facility] INVASIVE CV LAB;  Service: Cardiovascular;  Laterality: Left;   RIGHT/LEFT HEART CATH AND CORONARY ANGIOGRAPHY N/A 09/06/2019   Procedure: RIGHT/LEFT HEART CATH AND CORONARY ANGIOGRAPHY;  Surgeon: Tonny Bollman, MD;  Location: [facility] INVASIVE CV LAB;  Service: Cardiovascular;  Laterality: N/A;    Family History  Problem Relation Age of Onset   Heart disease Mother    Cancer Mother        ovarian   Arthritis Mother    Diabetes Mother    Hypertension Mother    Hypertension Father    Stroke Father    Diabetes Sister    Cancer Other    Coronary artery disease Other    Diabetes Other     Social History:  reports that she has never smoked. She has never used smokeless tobacco. She reports that she does not drink alcohol and does not use drugs.  Allergies:  Allergies  Allergen Reactions   Lyrica [Pregabalin] Other (See Comments)    sleepy    Medications: I have reviewed the patient's current medications.  Results for orders placed or performed during the hospital encounter of 09/25/23 (from the past 48 hours)  CBC with Differential     Status: Abnormal   Collection Time: 09/25/23  8:50 PM  Result Value Ref Range   WBC 13.6 (H) 4.0 -  10.5 K/uL   RBC 4.45 3.87 - 5.11 MIL/uL   Hemoglobin 13.1 12.0 - 15.0 g/dL   HCT 01.0 27.2 - 53.6 %   MCV 91.7 80.0 - 100.0 fL   MCH 29.4 26.0 - 34.0 pg   MCHC 32.1 30.0 - 36.0 g/dL   RDW 64.4 03.4 - 74.2 %   Platelets 298 150 - 400 K/uL   nRBC 0.0 0.0 - 0.2 %   Neutrophils Relative % 63 %   Neutro Abs 8.7 (H) 1.7 - 7.7 K/uL   Lymphocytes Relative 28 %   Lymphs Abs 3.8 0.7 - 4.0 K/uL   Monocytes Relative 7 %   Monocytes Absolute 0.9 0.1 - 1.0 K/uL   Eosinophils Relative 2 %   Eosinophils Absolute 0.3 0.0 - 0.5 K/uL   Basophils Relative 0 %   Basophils Absolute 0.0 0.0 - 0.1 K/uL   Immature Granulocytes 0 %   Abs Immature Granulocytes 0.04 0.00 - 0.07 K/uL     Comment: Performed at [facility], [address], [place], [place] [address]  Basic metabolic panel     Status: None   Collection Time: 09/25/23  8:50 PM  Result Value Ref Range   Sodium 138 135 - 145 mmol/L   Potassium 3.8 3.5 - 5.1 mmol/L   Chloride 106 98 - 111 mmol/L   CO2 24 22 - 32 mmol/L   Glucose, Bld 86 70 - 99 mg/dL    Comment: Glucose reference range applies only to samples taken after fasting for at least 8 hours.   BUN 14 8 - 23 mg/dL   Creatinine, Ser 8.75 0.44 - 1.00 mg/dL   Calcium 9.3 8.9 - 64.3 mg/dL   GFR, Estimated >32 >95 mL/min    Comment: (NOTE) Calculated using the CKD-EPI Creatinine Equation (2021)    Anion gap 8 5 - 15    Comment: Performed at [facility], [address], [place], [place] [address]  Lactic acid, plasma     Status: None   Collection Time: 09/25/23  8:50 PM  Result Value Ref Range   Lactic Acid, Venous 1.3 0.5 - 1.9 mmol/L    Comment: Performed at [facility], [address], [place], [place] [address]  Culture, blood (routine x 2)     Status: None (Preliminary result)   Collection Time: 09/25/23  8:50 PM   Specimen: BLOOD  Result Value Ref Range   Specimen Description      BLOOD RIGHT ANTECUBITAL Performed at [facility], [address] [place], [place] [address]    Special Requests      BOTTLES DRAWN AEROBIC AND ANAEROBIC Blood Culture results may not be optimal due to an inadequate volume of blood received in culture bottles Performed at [facility], [address] [place], [place] [address]    Culture PENDING    Report Status PENDING   Glucose, capillary     Status: None   Collection Time: 09/26/23 12:27 AM  Result Value Ref Range   Glucose-Capillary 83 70 - 99 mg/dL    Comment: Glucose reference range applies only to samples taken after fasting for at least 8 hours.  Lactic acid, plasma     Status: None   Collection Time: 09/26/23  12:47 AM  Result Value Ref Range   Lactic Acid, Venous 0.7 0.5 - 1.9 mmol/L    Comment: Performed at [facility], [address] [place], [place] [address]  CBC     Status: Abnormal   Collection Time: 09/26/23 12:47 AM  Result Value Ref Range   WBC 15.2 (H) 4.0 - 10.5 K/uL   RBC 4.05 3.87 - 5.11 MIL/uL   Hemoglobin 12.3 12.0 - 15.0 g/dL   HCT 86.5 78.4 - 69.6 %   MCV 91.6 80.0 - 100.0 fL   MCH 30.4 26.0 - 34.0 pg   MCHC 33.2 30.0 - 36.0 g/dL   RDW 29.5 28.4 - 13.2 %   Platelets 267 150 - 400 K/uL   nRBC 0.0 0.0 - 0.2 %    Comment: Performed at [facility], [address], [place], [place] [address]  Creatinine, serum     Status: None   Collection Time: 09/26/23 12:47 AM  Result Value Ref Range   Creatinine, Ser 0.58 0.44 - 1.00 mg/dL   GFR, Estimated >27 >25 mL/min    Comment: (NOTE) Calculated using the CKD-EPI Creatinine Equation (2021) Performed at [facility], [address], [place], [place] [address]   CBC     Status: Abnormal   Collection Time: 09/26/23  3:42 AM  Result Value Ref Range   WBC 13.0 (H) 4.0 - 10.5 K/uL   RBC 4.04 3.87 - 5.11 MIL/uL   Hemoglobin 11.8 (L) 12.0 - 15.0 g/dL   HCT 03.4 74.2 - 59.5 %   MCV 93.1 80.0 - 100.0 fL   MCH 29.2 26.0 - 34.0 pg   MCHC 31.4 30.0 - 36.0 g/dL   RDW 63.8 75.6 - 43.3 %   Platelets 265 150 - 400 K/uL   nRBC 0.0 0.0 - 0.2 %    Comment: Performed at [facility], [address], [place], [place] [address]  Comprehensive metabolic panel     Status: Abnormal   Collection Time: 09/26/23  3:42 AM  Result Value Ref Range   Sodium 138 135 - 145 mmol/L   Potassium 3.8 3.5 - 5.1 mmol/L   Chloride 106 98 - 111 mmol/L   CO2 24 22 - 32 mmol/L   Glucose, Bld 123 (H) 70 - 99 mg/dL    Comment: Glucose reference range applies only to samples taken after fasting for at least 8 hours.   BUN 14 8 - 23 mg/dL   Creatinine, Ser 8.41 0.44 - 1.00 mg/dL    Calcium 8.8 (L) 8.9 - 10.3 mg/dL   Total Protein 6.8 6.5 - 8.1 g/dL   Albumin 3.6 3.5 - 5.0 g/dL   AST 20 15 - 41 U/L   ALT 23 0 - 44 U/L   Alkaline Phosphatase 69 38 - 126 U/L   Total Bilirubin 0.9 <1.2 mg/dL   GFR, Estimated >66 >06 mL/min    Comment: (NOTE) Calculated using the CKD-EPI Creatinine Equation (2021)    Anion gap 8 5 - 15    Comment: Performed at [facility], [address], [place], [place] [address]  Glucose, capillary     Status: Abnormal   Collection Time: 09/26/23  7:34 AM  Result Value Ref Range   Glucose-Capillary 132 (H) 70 - 99 mg/dL    Comment: Glucose reference range applies only to samples taken after fasting for at least 8 hours.    DG Foot 2 Views Left Result Date: 09/25/2023 CLINICAL DATA:  Left foot infection EXAM: LEFT FOOT - 2 VIEW COMPARISON:  None Available. FINDINGS: Amputation of the left great toe distal to the first metatarsal has been performed. No acute fracture  or dislocation. Joint spaces are preserved. No osseous erosions or abnormal periosteal reaction. There is mild soft tissue swelling of the left forefoot and subcutaneous gas seen within the first web space and subjacent to the second proximal phalanx which may relate to aggressive infection or local trauma. No retained radiopaque foreign body. Mild midfoot degenerative arthritis. Small superior and plantar calcaneal spur. Vascular calcifications are noted. IMPRESSION: 1. Soft tissue swelling and subcutaneous gas within the first web space and subjacent to the second proximal phalanx which may relate to aggressive infection or local trauma. No radiographic evidence of osteomyelitis. Electronically Signed   By: Helyn Numbers M.D.   On: 09/25/2023 22:02    Review of Systems Blood pressure (!) 155/76, pulse 70, temperature (!) 97.5 F (36.4 C), temperature source Oral, resp. rate 18, height 5' 6.5" (1.689 m), weight 118.8 kg, SpO2 100%. Physical Exam General: AAO x3, NAD-  sitting up in bed  Dermatological: Ulceration of the distal aspect left second toe, scabbing present.  There is edema and erythema present of the second toe.  There is some erythema just proximal to the MPJ as well.  I am not able to appreciate any area of fluctuation or crepitation.  The erythema proximally is blanchable.  There is no crepitation or pain into the leg.  There is no obvious drainable collection that I can palpate today.  Vascular: Dorsalis Pedis artery and Posterior Tibial artery pedal pulses are palpable bilateral with immedate capillary fill time. There is no pain with calf compression, swelling, warmth, erythema.   Neruologic: Decreased  Musculoskeletal: Hammertoes present.  No pain.  Assessment/Plan: Left second toe cellulitis  X-rays independent reviewed.  There is some concern for air by think this is coming more from swelling.  Clinically there is no areas of crepitation or any obvious drainable collection. White blood cell count is trending down today.  She is currently afebrile as well.  Clinically she is stable and feels well.  I am still concerned about osteomyelitis.  Therefore would order an MRI of the left foot to further evaluate this and the extent of the infection.  I do not anticipate any emergent surgical intervention today but likely plan for surgery tomorrow.  Patient states that she does not want to proceed with amputation of the toe.  Discussed with her this is likely her best course of action to help prevent spread of infection if there is a deeper infection.  If she does not agree to amputation would need to possibly proceed with I&D, bone biopsy and/or continue observe on IV antibiotics.  For now continue broad-spectrum antibiotics.  White blood cell count is trending down today.  She is currently afebrile as well.  Clinically she is stable.  She underwent angio with Dr. Wyn Quaker on 08/02/2023: " Left Lower Extremity:  This demonstrated fairly normal common femoral  artery, profunda femoris artery, and an SFA with only mild disease less than 30%.  The popliteal artery was normal.  The anterior tibial was large and patent focal stenosis and good filling of the foot.  The peroneal artery provided a second runoff vessel distally without focal stenosis.  The posterior tibial artery was chronically occluded without distal reconstitution."  Felt to have adequate perfusion for wound healing. She did ultimately heal the hallux amputation.   She has had an issue with transportation and came to the office for follow-up.  Hopefully prior to discharge we can involve case management, social work to help arrange proper follow-up as  an outpatient.   Vivi Barrack 09/26/2023, 8:37 AM

## 2023-09-27 DIAGNOSIS — E1159 Type 2 diabetes mellitus with other circulatory complications: Secondary | ICD-10-CM

## 2023-09-27 DIAGNOSIS — I2510 Atherosclerotic heart disease of native coronary artery without angina pectoris: Secondary | ICD-10-CM | POA: Diagnosis not present

## 2023-09-27 DIAGNOSIS — M86172 Other acute osteomyelitis, left ankle and foot: Secondary | ICD-10-CM | POA: Diagnosis not present

## 2023-09-27 DIAGNOSIS — E08621 Diabetes mellitus due to underlying condition with foot ulcer: Secondary | ICD-10-CM

## 2023-09-27 DIAGNOSIS — L03032 Cellulitis of left toe: Secondary | ICD-10-CM | POA: Diagnosis not present

## 2023-09-27 DIAGNOSIS — L97401 Non-pressure chronic ulcer of unspecified heel and midfoot limited to breakdown of skin: Secondary | ICD-10-CM

## 2023-09-27 DIAGNOSIS — E1165 Type 2 diabetes mellitus with hyperglycemia: Secondary | ICD-10-CM

## 2023-09-27 LAB — BASIC METABOLIC PANEL
Anion gap: 7 (ref 5–15)
BUN: 15 mg/dL (ref 8–23)
CO2: 22 mmol/L (ref 22–32)
Calcium: 9.1 mg/dL (ref 8.9–10.3)
Chloride: 107 mmol/L (ref 98–111)
Creatinine, Ser: 0.72 mg/dL (ref 0.44–1.00)
GFR, Estimated: >60 mL/min (ref >60)
Glucose, Bld: 145 mg/dL — ABNORMAL HIGH (ref 70–99)
Potassium: 4 mmol/L (ref 3.5–5.1)
Sodium: 136 mmol/L (ref 135–145)

## 2023-09-27 LAB — CBC
HCT: 38.4 % (ref 36.0–46.0)
Hemoglobin: 12.1 g/dL (ref 12.0–15.0)
MCH: 29.2 pg (ref 26.0–34.0)
MCHC: 31.5 g/dL (ref 30.0–36.0)
MCV: 92.8 fL (ref 80.0–100.0)
Platelets: 275 10*3/uL (ref 150–400)
RBC: 4.14 MIL/uL (ref 3.87–5.11)
RDW: 13.2 % (ref 11.5–15.5)
WBC: 10.8 10*3/uL — ABNORMAL HIGH (ref 4.0–10.5)
nRBC: 0 % (ref 0.0–0.2)

## 2023-09-27 LAB — GLUCOSE, CAPILLARY
Glucose-Capillary: 168 mg/dL — ABNORMAL HIGH (ref 70–99)
Glucose-Capillary: 160 mg/dL — ABNORMAL HIGH (ref 70–99)

## 2023-09-27 NOTE — Progress Notes (Signed)
Patient is irritated and refusing care. Patient refused all medications and assessment. I provided education.  Patient is now threatening to leave AMA.

## 2023-09-27 NOTE — Consult Note (Signed)
Attempted to see patient for capacity evaluation and suicidal ideations. Upon arrival to tthe unit, patient room was empty and being cleaned by EVS. Chart review shows patient left AMA.

## 2023-09-27 NOTE — TOC Transition Note (Addendum)
Transition of Care [facility]) - Discharge Note   Patient Details  Name: Betty [name] MRN: [id number] Date of Birth: [birth year]  [facility]) CM/SW Contact:  Amada Jupiter, LCSW Phone Number: 09/27/2023, 2:50 PM   Clinical Narrative:     Met with pt this morning to attempt to gather information on potential dc needs.  CSW aware (per morning rounds) upon entering room that pt was not currently agreeable with plan for toe amputation.  Pt reiterates this to me and has several complaints about current medical management and past.  She, also, provides extensive list of frustrations in barriers she encounters in accessing the healthcare system - primary of which is transportation.  Notes she has been connected to potential resources, however, the fact that she lives in [place] and has MDs in [place], [place] and [place] she has had a LOT of difficulty either qualifying for assistance (out of city limits) or affording to privately pay for transport.  This has resulted in missed appointments.  She adds that she is frustrated with poor healing in her foot and fearful she will simply have several amputations and "final result" to be amputation of leg.    Attempted to provide emotional support and to discuss potential resources but pt simply would not engage.  She is agreeable with CSW contacting her son, Lennox Grumbles, who lives in [place].  Spoke with son who is well aware of pt's frustrations and is concerned that she is stating passive ways to "end it all" - notes she has spoken about refusing further surgeries and letting "nature take it's course with the infection."   Noted to son that goal would be to allow pt to express her concerns meanwhile encouraging her to proceed with surgery if it is medically necessary - he agrees. Regarding her difficulty with medical care/ follow up and community barriers, we discussed possible referral to [facility] which is a Patient advocacy program covered under  Medicare -he is agreeable and I have provided him with information on the program.    Attempted to see patient again this afternoon to discuss referral with her as well, but informed pt had left AMA.  Very unfortunate situation.  Will encourage son to reach out to [facility] if he wishes and pt in agreement.    Final next level of care: Against Medical Advice     Patient Goals and CMS Choice Patient states their goals for this hospitalization and ongoing recovery are:: go home          Discharge Placement                       Discharge Plan and Services Additional resources added to the After Visit Summary for                                       Social Drivers of Health (SDOH) Interventions SDOH Screenings   Food Insecurity: No Food Insecurity (09/26/2023)  Housing: High Risk (09/26/2023)  Transportation Needs: Unmet Transportation Needs (09/26/2023)  Utilities: Not At Risk (09/26/2023)  Tobacco Use: Low Risk  (09/25/2023)     Readmission Risk Interventions     No data to display

## 2023-09-27 NOTE — Progress Notes (Signed)
[facility]                                                                              Betty [name], is a 67 y.o. female, DOB - 1956/02/08, JXB:[id number] Admit date - 09/25/2023    Outpatient Primary MD for the patient is Hamrick, Durward Fortes, MD  LOS - 2  days  Chief Complaint  Patient presents with   Foot Problem       Brief summary   Patient is a 67 year old female, lives alone, ambulates with assistance of a walker, has IDDM with peripheral neuropathy, HTN, HLP, morbid obesity, OSA not on CPAP, CAD status post stents, osteomyelitis of the left great toe, status post amputation in 07/2023 presented to ED with left second toe redness and swelling. X-ray was negative for osteomyelitis and patient was admitted for cellulitis of left second toe with concern for deeper infection. Podiatry consulted.  Assessment & Plan    Principal Problem:   Acute osteomyelitis of toe, left (HCC) -X-ray of the left foot showed soft tissue swelling and subcutaneous gas within the first webspace and adjacent to second proximal phalanx, however no osteomyelitis -Podiatry was consulted, underwent MRI of the foot which confirmed osteomyelitis in the second toe -Placed on IV antibiotics, podiatry consulted -Patient was recommended surgery for the second toe.  - Patient however overwhelmed stating that "this should have been taken care of during previous surgery", now refusing medications and surgery, suicidal ideations, requested psychiatry consult.   Active Problems:    Poorly controlled type 2 diabetes mellitus with peripheral neuropathy (HCC) -HbA1c 7.8 on 06/04/2023 -Hold Ozempic, metformin CBG (last 3)  Recent Labs    09/26/23 2158 09/27/23 0812 09/27/23 1157  GLUCAP 199* 168* 160*   -Continue sliding scale insulin, Semglee while inpatient -Continue gabapentin    CAD, NATIVE VESSEL, status post stents -Continue Coreg, Vascepa. -Resume aspirin, Plavix when okay  with podiatry     Hyperlipidemia -Continue rosuvastatin, Vacsepa     HYPERTENSION, BENIGN -Continue Coreg, lisinopril    Diabetic peripheral neuropathy (HCC) -Continue gabapentin    Morbid obesity with BMI of 40.0-44.9, adult (HCC)  Estimated body mass index is 41.64 kg/m as calculated from the following:   Height as of this encounter: 5' 6.5" (1.689 m).   Weight as of this encounter: 118.8 kg.  Code Status: Full code DVT Prophylaxis:  enoxaparin (LOVENOX) injection 40 mg Start: 09/26/23 1000   Level of Care: Level of care: Med-Surg Family Communication: Updated patient Disposition Plan:      Remains inpatient appropriate:      Procedures:    Consultants:   Psychiatry Podiatry  Antimicrobials:   Anti-infectives (From admission, onward)    Start     Dose/Rate Route Frequency Ordered Stop   09/26/23 2200  vancomycin (VANCOREADY) IVPB 1750 mg/350 mL        1,750 mg 175 mL/hr over 120 Minutes Intravenous Every 24 hours 09/25/23 2302     09/26/23 0800  ceFEPIme (MAXIPIME) 2 g in sodium chloride 0.9 % 100 mL IVPB        2 g 200 mL/hr over 30 Minutes Intravenous Every  8 hours 09/26/23 0143     09/26/23 0000  metroNIDAZOLE (FLAGYL) IVPB 500 mg        500 mg 100 mL/hr over 60 Minutes Intravenous Every 12 hours 09/26/23 0000     09/25/23 2300  ceFEPIme (MAXIPIME) 2 g in sodium chloride 0.9 % 100 mL IVPB        2 g 200 mL/hr over 30 Minutes Intravenous  Once 09/25/23 2258 09/26/23 0200   09/25/23 2245  vancomycin (VANCOREADY) IVPB 2000 mg/400 mL        2,000 mg 200 mL/hr over 120 Minutes Intravenous  Once 09/25/23 2230 09/26/23 0100          Medications  carvedilol  3.125 mg Oral BID WC   enoxaparin (LOVENOX) injection  40 mg Subcutaneous Q24H   gabapentin  100 mg Oral QHS   icosapent Ethyl  1 g Oral BID   influenza vaccine adjuvanted  0.5 mL Intramuscular Once   insulin aspart  0-15 Units Subcutaneous TID WC   insulin aspart  0-5 Units Subcutaneous QHS    insulin glargine-yfgn  15 Units Subcutaneous Daily   latanoprost  1 drop Both Eyes QHS   mirabegron ER  25 mg Oral Daily   Osteo Bi-Flex Adv Triple St  1 tablet Oral BID   ramipril  10 mg Oral Daily   rosuvastatin  10 mg Oral Daily      Subjective:   Betty [name] was seen and examined today.  Sitting up in the chair during my encounter, upset over recommendation of surgery.  Patient denies dizziness, chest pain, shortness of breath, abdominal pain, N/V.  Objective:   Vitals:   09/26/23 1724 09/26/23 1736 09/26/23 2157 09/27/23 0358  BP: (!) 145/67 134/65 (!) 128/50 (!) 146/67  Pulse: 71 70 66 68  Resp:  17 17 18   Temp:  (!) 97.5 F (36.4 C) 97.6 F (36.4 C) 97.7 F (36.5 C)  TempSrc:  Oral Oral Oral  SpO2:  97% 97% 97%  Weight:      Height:        Intake/Output Summary (Last 24 hours) at 09/27/2023 1227 Last data filed at 09/27/2023 1208 Gross per 24 hour  Intake 1559.94 ml  Output --  Net 1559.94 ml     Wt Readings from Last 3 Encounters:  09/25/23 118.8 kg  08/11/23 118.8 kg  08/02/23 118.7 kg     Exam General: Alert and oriented x 3, NAD Cardiovascular: S1 S2 auscultated,  RRR Respiratory: Clear to auscultation bilaterally, no wheezing, rales or rhonchi Gastrointestinal: Soft, nontender, nondistended, + bowel sounds Ext: no pedal edema bilaterally status post left great toe amputation, healing well Neuro: no new deficits Skin: Second toe with mild to moderate swelling, redness, see pic Psych: anxious, upset     Data Reviewed:  I have personally reviewed following labs    CBC Lab Results  Component Value Date   WBC 10.8 (H) 09/27/2023   RBC 4.14 09/27/2023   HGB 12.1 09/27/2023   HCT 38.4 09/27/2023   MCV 92.8 09/27/2023   MCH 29.2 09/27/2023   PLT 275 09/27/2023   MCHC 31.5 09/27/2023   RDW 13.2 09/27/2023   LYMPHSABS 3.8 09/25/2023   MONOABS 0.9 09/25/2023   EOSABS 0.3 09/25/2023   BASOSABS 0.0 09/25/2023     Last metabolic  panel Lab Results  Component Value Date   NA 136 09/27/2023   K 4.0 09/27/2023   CL 107 09/27/2023   CO2 22 09/27/2023  BUN 15 09/27/2023   CREATININE 0.72 09/27/2023   GLUCOSE 145 (H) 09/27/2023   GFRNONAA >60 09/27/2023   GFRAA 106 09/29/2019   CALCIUM 9.1 09/27/2023   PHOS 2.7 08/02/2023   PROT 6.8 09/26/2023   ALBUMIN 3.6 09/26/2023   BILITOT 0.9 09/26/2023   ALKPHOS 69 09/26/2023   AST 20 09/26/2023   ALT 23 09/26/2023   ANIONGAP 7 09/27/2023    CBG (last 3)  Recent Labs    09/26/23 2158 09/27/23 0812 09/27/23 1157  GLUCAP 199* 168* 160*      Coagulation Profile: No results for input(s): "INR", "PROTIME" in the last 168 hours.   Radiology Studies: I have personally reviewed the imaging studies  MR FOOT LEFT W WO CONTRAST Result Date: 09/26/2023 CLINICAL DATA:  Left second toe and left first webspace cellulitis. Evaluate for osteomyelitis. EXAM: MRI OF THE LEFT FOREFOOT WITHOUT AND WITH CONTRAST TECHNIQUE: Multiplanar, multisequence MR imaging of the left forefoot was performed both before and after administration of intravenous contrast. CONTRAST:  10mL GADAVIST GADOBUTROL 1 MMOL/ML IV SOLN COMPARISON:  Left foot radiographs 09/25/2023, 08/27/2023, 07/31/2023; MRI left forefoot 07/30/2023 MRI left forefoot 07/30/2023. FINDINGS: Bones/Joint/Cartilage Interval amputation of the great toe proximal distal phalanges compared to 07/30/2023 MRI. There is nonspecific mild marrow edema within the medial aspect of the great toe metatarsal head with minimal decreased T1 signal in this region. Given there appears to be a possible thin sinus tract/soft tissue wound within the soft tissues bordering the dorsomedial aspect of the distal first metatarsal (axial series 5 image 21 and coronal series 7, images 9 through 13), findings are suspicious for early osteomyelitis. This marrow edema is new from 07/30/2023 MRI. There is a diffuse marrow edema throughout the in the distal phalanges  of the second toe (sagittal series 8 images 21 through 24). There is a mildly displaced oblique fracture of the proximal shaft of the proximal phalanx of the second toe (sagittal series 8 image 19 through 21). There is moderate second toe soft tissue swelling with possible mild ulceration at the distal toe soft tissues. Recommend clinical correlation. There is a 6 mm decreased T1 increased T2 signal possible ganglion extending dorsally and medially from the first tarsometatarsal joint (sagittal series 8, image 10), similar to 07/30/2023. Ligaments The Lisfranc ligament complex is intact. The metatarsophalangeal collateral ligaments appear intact. Muscles and Tendons No tendon tear is visualized. Moderate nonspecific intrinsic foot myositis. Soft tissues Moderate subcutaneous fat edema and swelling of the dorsomedial midfoot and forefoot, plantar mid to medial midfoot to forefoot, and dorsal aspect of the third through fifth toes. Possible wound medial to the great toe metatarsal head, as described above. IMPRESSION: Compared to 07/30/2023: 1. Interval amputation of the great toe proximal distal phalanges compared to 07/30/2023 MRI. There is nonspecific mild marrow edema within the medial aspect of the great toe metatarsal head with minimal decreased T1 signal in this region. Question a possible thin sinus tract/soft tissue wound within bordering the dorsomedial aspect of the distal first metatarsal, findings are suspicious for early osteomyelitis. Recommend clinical correlation. 2. There is moderate second toe soft tissue swelling with possible mild ulceration at the distal toe soft tissues. Diffuse marrow edema throughout the distal phalanges of the second toe. Findings are suspicious for osteomyelitis of the middle and distal phalanges. 3. Mildly displaced oblique fracture of the proximal shaft of the proximal phalanx of the second toe. Electronically Signed   By: Neita Garnet M.D.   On: 09/26/2023 19:36  DG  Foot 2 Views Left Result Date: 09/25/2023 CLINICAL DATA:  Left foot infection EXAM: LEFT FOOT - 2 VIEW COMPARISON:  None Available. FINDINGS: Amputation of the left great toe distal to the first metatarsal has been performed. No acute fracture or dislocation. Joint spaces are preserved. No osseous erosions or abnormal periosteal reaction. There is mild soft tissue swelling of the left forefoot and subcutaneous gas seen within the first web space and subjacent to the second proximal phalanx which may relate to aggressive infection or local trauma. No retained radiopaque foreign body. Mild midfoot degenerative arthritis. Small superior and plantar calcaneal spur. Vascular calcifications are noted. IMPRESSION: 1. Soft tissue swelling and subcutaneous gas within the first web space and subjacent to the second proximal phalanx which may relate to aggressive infection or local trauma. No radiographic evidence of osteomyelitis. Electronically Signed   By: Helyn Numbers M.D.   On: 09/25/2023 22:02       Buren Havey M.D. [facility] 09/27/2023, 12:27 PM  Available via Epic secure chat 7am-7pm After 7 pm, please refer to night coverage provider listed on amion.

## 2023-09-27 NOTE — Progress Notes (Signed)
PODIATRY PROGRESS NOTE Patient Name: Betty [name]  DOB [birth year] DOA 09/25/2023  Hospital Day: 3  Assessment:  67 y.o. female with DM2 with neuropathy hyperlipidemia hypertension morbid obesity OSA CAD status post left great toe amputation in October 2024 with concern for osteomyelitis of the second toe left foot and possible residual osteomyelitis first metatarsal head.  AF, VSS  WBC: 10.8  Wound/Bone Cultures: Pending OR  Imaging: MRI left foot without contrast -Questionable osteomyelitis medial aspect of the first metatarsal head with possible sinus tract -Osteomyelitis of the middle and distal phalanges of the second toe   Plan:  -MRI with concerns for osteomyelitis of the distal and middle phalanges of the second toe on the left foot as well as possible osteomyelitis of the first metatarsal head. -Would therefore recommend amputation of the second toe at MPJ level as well as  first metatarsal head resection. - Patient wishes to discuss my recommendations with her family prior to proceeding with surgery.  Tentatively n.p.o. past midnight tonight for possible surgical intervention tomorrow should she be willing to proceed -Discussed the risks of delaying surgery as well as the risks of not performing surgery this admission including spreading of infection more proximally in the foot necessitating amputation of her left leg.  She is aware if she chooses not to proceed with surgical intervention I would recommend her to see another provider for second opinion -Continue on IV antibiotics, clinically stable Will continue to follow        Corinna Gab, DPM [facility]    Subjective:  Discussed with patient findings and recommendations for surgical intervention.  She is unwilling to proceed at this time she is very emotional regarding what is transpired in the past.  I tried to reason with the patient that we cannot change what has happened and must  address the current situation as it is.  Patient wishes to discuss further with family members and then I discussed that we will potentially proceed with surgical intervention tomorrow  Objective:   Vitals:   09/26/23 2157 09/27/23 0358  BP: (!) 128/50 (!) 146/67  Pulse: 66 68  Resp: 17 18  Temp: 97.6 F (36.4 C) 97.7 F (36.5 C)  SpO2: 97% 97%       Latest Ref Rng & Units 09/27/2023    3:40 AM 09/26/2023    3:42 AM 09/26/2023   12:47 AM  CBC  WBC 4.0 - 10.5 K/uL 10.8  13.0  15.2   Hemoglobin 12.0 - 15.0 g/dL 56.3  87.5  64.3   Hematocrit 36.0 - 46.0 % 38.4  37.6  37.1   Platelets 150 - 400 K/uL 275  265  267        Latest Ref Rng & Units 09/27/2023    3:40 AM 09/26/2023    3:42 AM 09/26/2023   12:47 AM  BMP  Glucose 70 - 99 mg/dL 329  518    BUN 8 - 23 mg/dL 15  14    Creatinine 8.41 - 1.00 mg/dL 6.60  6.30  1.60   Sodium 135 - 145 mmol/L 136  138    Potassium 3.5 - 5.1 mmol/L 4.0  3.8    Chloride 98 - 111 mmol/L 107  106    CO2 22 - 32 mmol/L 22  24    Calcium 8.9 - 10.3 mg/dL 9.1  8.8      General: AAOx3, NAD  Lower Extremity Exam  Erythema and edema of  the 2nd toe left foot with ulceration distal tuft of toe. DP and PT pulses palpable on left fooot. Hammertoe deformity. Prior Left hallux amp at MPJ level - no open wound at that location.    Radiology:  Results reviewed. See assessment for pertinent imaging results

## 2023-09-27 NOTE — Discharge Summary (Signed)
AMA NOTE    Patient ID: Betty [name] MRN: [id number] DOB/AGE: April 01, 1956 67 y.o.  Admit date: 09/25/2023 Discharge date: 09/27/2023   PLEASE NOTE THAT PATIENT LEFT AGAINST MEDICAL ADVICE. Risks of worsening infection, cellulitis, gangrene, sepsis were explained in detail to the patient and he/she verbally understood the risks of leaving AMA.   Primary Care Physician:  Ailene Ravel, MD  Discharge Diagnoses:      Diabetic foot ulcer_left great toe  Poorly controlled type 2 diabetes mellitus with circulatory disorder (HCC)  Acute osteomyelitis of toe, left (HCC)  CAD, NATIVE VESSEL  Hyperlipidemia  HYPERTENSION, BENIGN  Diabetic peripheral neuropathy (HCC)  Osteomyelitis of second toe of left foot [facility])   Consults:   Podiatry Psychiatry   Recommendations for Outpatient Follow-up:  Patient left AMA however I spoke to the patient's son x2 and he will convince her to go to Dr. Gabriel Rung office for follow-up appointment at 1:45 PM tomorrow.   TESTS THAT NEED FOLLOW-UP    DIET: Carb modified diet    Allergies:   Allergies  Allergen Reactions   Lyrica [Pregabalin] Other (See Comments)    sleepy     Discharge Medications: Please note that patient left AMA (against medical advice)   Brief H and P: For complete details please refer to admission H and P, but in brief Patient is a 67 year old female, lives alone, ambulates with assistance of a walker, has IDDM with peripheral neuropathy, HTN, HLP, morbid obesity, OSA not on CPAP, CAD status post stents, osteomyelitis of the left great toe, status post amputation in 07/2023 presented to ED with left second toe redness and swelling. X-ray was negative for osteomyelitis and patient was admitted for cellulitis of left second toe with concern for deeper infection. Podiatry consulted.  Hospital Course:   Acute osteomyelitis of toe, left (HCC) -X-ray of the left foot showed soft tissue swelling and  subcutaneous gas within the first webspace and adjacent to second proximal phalanx, however no osteomyelitis -Podiatry was consulted, underwent MRI of the foot which confirmed osteomyelitis in the second toe -Patient was placed on IV antibiotics -Patient was seen by Dr. Annamary Rummage, podiatry this morning and recommended surgery for the second toe due to osteomyelitis - Patient however got very overwhelmed and upset stating that "this should have been taken care of during previous surgery", and she should not have needed another surgery.  Started refusing medications and further care.  Suicidal ideation was mentioned by patient's relative.  Hence a sitter was placed.  However I spoke with the patient's son, Betty [name] twice and he mentioned that patient has been very overwhelmed, needs time to process the new information about new surgery about about potential amputation of the second toe.  He does not think patient is suicidal although she feels " giving up" as the medications are not working and she has to go through surgery. -Discussed with podiatry, Dr. Ardelle Anton who was able to arrange patient to follow-up tomorrow 09/28/2023 at 1:45 PM.  I again relayed that to patient's son and he will convince her to go to the appointment for other options if she does not want surgery.    Poorly controlled type 2 diabetes mellitus with peripheral neuropathy (HCC) -HbA1c 7.8 on 06/04/2023 -Continue Ozempic, metformin -Continue gabapentin      CAD, NATIVE VESSEL, status post stents -Continue Coreg, Vascepa.  Continue aspirin and Plavix      Hyperlipidemia -Continue rosuvastatin, Vacsepa      HYPERTENSION, BENIGN -Continue Coreg, lisinopril  Diabetic peripheral neuropathy (HCC) -Continue gabapentin     Morbid obesity with BMI of 40.0-44.9, adult (HCC)   Estimated body mass index is 41.64 kg/m as calculated from the following:   Height as of this encounter: 5' 6.5" (1.689 m).   Weight as of  this encounter: 118.8 kg.         The results of significant diagnostics from this hospitalization (including imaging, microbiology, ancillary and laboratory) are listed below for reference.    LAB RESULTS: Basic Metabolic Panel: Recent Labs  Lab 09/26/23 0342 09/27/23 0340  NA 138 136  K 3.8 4.0  CL 106 107  CO2 24 22  GLUCOSE 123* 145*  BUN 14 15  CREATININE 0.65 0.72  CALCIUM 8.8* 9.1   Liver Function Tests: Recent Labs  Lab 09/26/23 0342  AST 20  ALT 23  ALKPHOS 69  BILITOT 0.9  PROT 6.8  ALBUMIN 3.6   No results for input(s): "LIPASE", "AMYLASE" in the last 168 hours. No results for input(s): "AMMONIA" in the last 168 hours. CBC: Recent Labs  Lab 09/25/23 2050 09/26/23 0047 09/26/23 0342 09/27/23 0340  WBC 13.6*   < > 13.0* 10.8*  NEUTROABS 8.7*  --   --   --   HGB 13.1   < > 11.8* 12.1  HCT 40.8   < > 37.6 38.4  MCV 91.7   < > 93.1 92.8  PLT 298   < > 265 275   < > = values in this interval not displayed.   Cardiac Enzymes: No results for input(s): "CKTOTAL", "CKMB", "CKMBINDEX", "TROPONINI" in the last 168 hours. BNP: Invalid input(s): "POCBNP" CBG: Recent Labs  Lab 09/27/23 0812 09/27/23 1157  GLUCAP 168* 160*    Significant Diagnostic Studies:  MR FOOT LEFT W WO CONTRAST Result Date: 09/26/2023 CLINICAL DATA:  Left second toe and left first webspace cellulitis. Evaluate for osteomyelitis. EXAM: MRI OF THE LEFT FOREFOOT WITHOUT AND WITH CONTRAST TECHNIQUE: Multiplanar, multisequence MR imaging of the left forefoot was performed both before and after administration of intravenous contrast. CONTRAST:  10mL GADAVIST GADOBUTROL 1 MMOL/ML IV SOLN COMPARISON:  Left foot radiographs 09/25/2023, 08/27/2023, 07/31/2023; MRI left forefoot 07/30/2023 MRI left forefoot 07/30/2023. FINDINGS: Bones/Joint/Cartilage Interval amputation of the great toe proximal distal phalanges compared to 07/30/2023 MRI. There is nonspecific mild marrow edema within the  medial aspect of the great toe metatarsal head with minimal decreased T1 signal in this region. Given there appears to be a possible thin sinus tract/soft tissue wound within the soft tissues bordering the dorsomedial aspect of the distal first metatarsal (axial series 5 image 21 and coronal series 7, images 9 through 13), findings are suspicious for early osteomyelitis. This marrow edema is new from 07/30/2023 MRI. There is a diffuse marrow edema throughout the in the distal phalanges of the second toe (sagittal series 8 images 21 through 24). There is a mildly displaced oblique fracture of the proximal shaft of the proximal phalanx of the second toe (sagittal series 8 image 19 through 21). There is moderate second toe soft tissue swelling with possible mild ulceration at the distal toe soft tissues. Recommend clinical correlation. There is a 6 mm decreased T1 increased T2 signal possible ganglion extending dorsally and medially from the first tarsometatarsal joint (sagittal series 8, image 10), similar to 07/30/2023. Ligaments The Lisfranc ligament complex is intact. The metatarsophalangeal collateral ligaments appear intact. Muscles and Tendons No tendon tear is visualized. Moderate nonspecific intrinsic foot myositis. Soft tissues Moderate subcutaneous  fat edema and swelling of the dorsomedial midfoot and forefoot, plantar mid to medial midfoot to forefoot, and dorsal aspect of the third through fifth toes. Possible wound medial to the great toe metatarsal head, as described above. IMPRESSION: Compared to 07/30/2023: 1. Interval amputation of the great toe proximal distal phalanges compared to 07/30/2023 MRI. There is nonspecific mild marrow edema within the medial aspect of the great toe metatarsal head with minimal decreased T1 signal in this region. Question a possible thin sinus tract/soft tissue wound within bordering the dorsomedial aspect of the distal first metatarsal, findings are suspicious for early  osteomyelitis. Recommend clinical correlation. 2. There is moderate second toe soft tissue swelling with possible mild ulceration at the distal toe soft tissues. Diffuse marrow edema throughout the distal phalanges of the second toe. Findings are suspicious for osteomyelitis of the middle and distal phalanges. 3. Mildly displaced oblique fracture of the proximal shaft of the proximal phalanx of the second toe. Electronically Signed   By: Neita Garnet M.D.   On: 09/26/2023 19:36   DG Foot 2 Views Left Result Date: 09/25/2023 CLINICAL DATA:  Left foot infection EXAM: LEFT FOOT - 2 VIEW COMPARISON:  None Available. FINDINGS: Amputation of the left great toe distal to the first metatarsal has been performed. No acute fracture or dislocation. Joint spaces are preserved. No osseous erosions or abnormal periosteal reaction. There is mild soft tissue swelling of the left forefoot and subcutaneous gas seen within the first web space and subjacent to the second proximal phalanx which may relate to aggressive infection or local trauma. No retained radiopaque foreign body. Mild midfoot degenerative arthritis. Small superior and plantar calcaneal spur. Vascular calcifications are noted. IMPRESSION: 1. Soft tissue swelling and subcutaneous gas within the first web space and subjacent to the second proximal phalanx which may relate to aggressive infection or local trauma. No radiographic evidence of osteomyelitis. Electronically Signed   By: Helyn Numbers M.D.   On: 09/25/2023 22:02    2D ECHO:   Disposition and Follow-up:    DISPOSITION: Patient left AMA.She was advised to seek follow-up with primary care physician.     DISCHARGE FOLLOW-UP    Time spent on Discharge:   Signed:   Thad Ranger M.D. [facility] 09/27/2023, 4:42 PM

## 2023-09-27 NOTE — Progress Notes (Signed)
Patient expressed suicidal thoughts. I then notified the MD about my findings and a telesitter and psych eval was ordered.I educated the patient on our need to leave the door open and put a telesitter in place.Patient began to become increasingly agitated and proceeded to slam the door closed and hold it shut. Security was notified.Telesitter was then put in place.  Patient then began getting dressed and proceeded to leave the floor AMA without signing form

## 2023-09-28 ENCOUNTER — Encounter (HOSPITAL_COMMUNITY): Payer: Self-pay | Admitting: Internal Medicine

## 2023-09-28 ENCOUNTER — Encounter: Payer: Federal, State, Local not specified - PPO | Admitting: Podiatry

## 2023-09-28 ENCOUNTER — Other Ambulatory Visit: Payer: Self-pay

## 2023-09-28 ENCOUNTER — Inpatient Hospital Stay (HOSPITAL_COMMUNITY)
Admission: EM | Admit: 2023-09-28 | Discharge: 2023-10-01 | DRG: 617 | Disposition: A | Discharge status: Home / Self Care | Payer: Medicare Other | Attending: Internal Medicine | Admitting: Internal Medicine

## 2023-09-28 ENCOUNTER — Telehealth (INDEPENDENT_AMBULATORY_CARE_PROVIDER_SITE_OTHER): Payer: Self-pay | Admitting: Podiatry

## 2023-09-28 ENCOUNTER — Ambulatory Visit (INDEPENDENT_AMBULATORY_CARE_PROVIDER_SITE_OTHER): Payer: Medicare Other | Admitting: Podiatry

## 2023-09-28 DIAGNOSIS — E11649 Type 2 diabetes mellitus with hypoglycemia without coma: Secondary | ICD-10-CM | POA: Diagnosis not present

## 2023-09-28 DIAGNOSIS — E11628 Type 2 diabetes mellitus with other skin complications: Secondary | ICD-10-CM | POA: Diagnosis present

## 2023-09-28 DIAGNOSIS — B955 Unspecified streptococcus as the cause of diseases classified elsewhere: Secondary | ICD-10-CM | POA: Diagnosis not present

## 2023-09-28 DIAGNOSIS — Z91048 Other nonmedicinal substance allergy status: Secondary | ICD-10-CM

## 2023-09-28 DIAGNOSIS — G4733 Obstructive sleep apnea (adult) (pediatric): Secondary | ICD-10-CM | POA: Diagnosis present

## 2023-09-28 DIAGNOSIS — E1159 Type 2 diabetes mellitus with other circulatory complications: Secondary | ICD-10-CM | POA: Diagnosis present

## 2023-09-28 DIAGNOSIS — Z7982 Long term (current) use of aspirin: Secondary | ICD-10-CM | POA: Diagnosis not present

## 2023-09-28 DIAGNOSIS — R269 Unspecified abnormalities of gait and mobility: Secondary | ICD-10-CM

## 2023-09-28 DIAGNOSIS — M86172 Other acute osteomyelitis, left ankle and foot: Secondary | ICD-10-CM | POA: Diagnosis present

## 2023-09-28 DIAGNOSIS — I2510 Atherosclerotic heart disease of native coronary artery without angina pectoris: Secondary | ICD-10-CM | POA: Diagnosis present

## 2023-09-28 DIAGNOSIS — Z888 Allergy status to other drugs, medicaments and biological substances status: Secondary | ICD-10-CM | POA: Diagnosis not present

## 2023-09-28 DIAGNOSIS — Z809 Family history of malignant neoplasm, unspecified: Secondary | ICD-10-CM

## 2023-09-28 DIAGNOSIS — L97529 Non-pressure chronic ulcer of other part of left foot with unspecified severity: Secondary | ICD-10-CM | POA: Diagnosis present

## 2023-09-28 DIAGNOSIS — Z794 Long term (current) use of insulin: Secondary | ICD-10-CM | POA: Diagnosis not present

## 2023-09-28 DIAGNOSIS — L089 Local infection of the skin and subcutaneous tissue, unspecified: Principal | ICD-10-CM | POA: Diagnosis present

## 2023-09-28 DIAGNOSIS — M869 Osteomyelitis, unspecified: Secondary | ICD-10-CM

## 2023-09-28 DIAGNOSIS — E1165 Type 2 diabetes mellitus with hyperglycemia: Secondary | ICD-10-CM | POA: Diagnosis present

## 2023-09-28 DIAGNOSIS — Z8249 Family history of ischemic heart disease and other diseases of the circulatory system: Secondary | ICD-10-CM

## 2023-09-28 DIAGNOSIS — Z8041 Family history of malignant neoplasm of ovary: Secondary | ICD-10-CM

## 2023-09-28 DIAGNOSIS — M797 Fibromyalgia: Secondary | ICD-10-CM | POA: Diagnosis present

## 2023-09-28 DIAGNOSIS — Z89412 Acquired absence of left great toe: Secondary | ICD-10-CM

## 2023-09-28 DIAGNOSIS — E785 Hyperlipidemia, unspecified: Secondary | ICD-10-CM | POA: Diagnosis present

## 2023-09-28 DIAGNOSIS — Z955 Presence of coronary angioplasty implant and graft: Secondary | ICD-10-CM

## 2023-09-28 DIAGNOSIS — E66813 Obesity, class 3: Secondary | ICD-10-CM | POA: Diagnosis present

## 2023-09-28 DIAGNOSIS — E1169 Type 2 diabetes mellitus with other specified complication: Secondary | ICD-10-CM | POA: Diagnosis present

## 2023-09-28 DIAGNOSIS — E11621 Type 2 diabetes mellitus with foot ulcer: Secondary | ICD-10-CM | POA: Diagnosis present

## 2023-09-28 DIAGNOSIS — Z7984 Long term (current) use of oral hypoglycemic drugs: Secondary | ICD-10-CM

## 2023-09-28 DIAGNOSIS — R7881 Bacteremia: Secondary | ICD-10-CM | POA: Diagnosis present

## 2023-09-28 DIAGNOSIS — M86072 Acute hematogenous osteomyelitis, left ankle and foot: Secondary | ICD-10-CM | POA: Diagnosis not present

## 2023-09-28 DIAGNOSIS — G4730 Sleep apnea, unspecified: Secondary | ICD-10-CM | POA: Diagnosis present

## 2023-09-28 DIAGNOSIS — Z79899 Other long term (current) drug therapy: Secondary | ICD-10-CM | POA: Diagnosis not present

## 2023-09-28 DIAGNOSIS — E1140 Type 2 diabetes mellitus with diabetic neuropathy, unspecified: Secondary | ICD-10-CM | POA: Diagnosis present

## 2023-09-28 DIAGNOSIS — B351 Tinea unguium: Secondary | ICD-10-CM

## 2023-09-28 DIAGNOSIS — I10 Essential (primary) hypertension: Secondary | ICD-10-CM | POA: Diagnosis present

## 2023-09-28 DIAGNOSIS — Z833 Family history of diabetes mellitus: Secondary | ICD-10-CM

## 2023-09-28 DIAGNOSIS — Z7902 Long term (current) use of antithrombotics/antiplatelets: Secondary | ICD-10-CM

## 2023-09-28 DIAGNOSIS — Z79818 Long term (current) use of other agents affecting estrogen receptors and estrogen levels: Secondary | ICD-10-CM

## 2023-09-28 DIAGNOSIS — Z7985 Long-term (current) use of injectable non-insulin antidiabetic drugs: Secondary | ICD-10-CM

## 2023-09-28 DIAGNOSIS — Z6841 Body Mass Index (BMI) 40.0 and over, adult: Secondary | ICD-10-CM | POA: Diagnosis not present

## 2023-09-28 DIAGNOSIS — Z823 Family history of stroke: Secondary | ICD-10-CM

## 2023-09-28 DIAGNOSIS — Z91148 Patient's other noncompliance with medication regimen for other reason: Secondary | ICD-10-CM

## 2023-09-28 DIAGNOSIS — Z8261 Family history of arthritis: Secondary | ICD-10-CM

## 2023-09-28 DIAGNOSIS — Z8673 Personal history of transient ischemic attack (TIA), and cerebral infarction without residual deficits: Secondary | ICD-10-CM

## 2023-09-28 LAB — COMPREHENSIVE METABOLIC PANEL
ALT: 24 U/L (ref 0–44)
AST: 21 U/L (ref 15–41)
Albumin: 3.6 g/dL (ref 3.5–5.0)
Alkaline Phosphatase: 69 U/L (ref 38–126)
Anion gap: 12 (ref 5–15)
BUN: 11 mg/dL (ref 8–23)
CO2: 20 mmol/L — ABNORMAL LOW (ref 22–32)
Calcium: 9.5 mg/dL (ref 8.9–10.3)
Chloride: 107 mmol/L (ref 98–111)
Creatinine, Ser: 0.59 mg/dL (ref 0.44–1.00)
GFR, Estimated: >60 mL/min (ref >60)
Glucose, Bld: 174 mg/dL — ABNORMAL HIGH (ref 70–99)
Potassium: 4.2 mmol/L (ref 3.5–5.1)
Sodium: 139 mmol/L (ref 135–145)
Total Bilirubin: 0.7 mg/dL (ref <1.2)
Total Protein: 7.3 g/dL (ref 6.5–8.1)

## 2023-09-28 LAB — CBC
HCT: 38.8 % (ref 36.0–46.0)
Hemoglobin: 12.7 g/dL (ref 12.0–15.0)
MCH: 29.3 pg (ref 26.0–34.0)
MCHC: 32.7 g/dL (ref 30.0–36.0)
MCV: 89.4 fL (ref 80.0–100.0)
Platelets: 293 10*3/uL (ref 150–400)
RBC: 4.34 MIL/uL (ref 3.87–5.11)
RDW: 13.2 % (ref 11.5–15.5)
WBC: 12.8 10*3/uL — ABNORMAL HIGH (ref 4.0–10.5)
nRBC: 0 % (ref 0.0–0.2)

## 2023-09-28 LAB — RETICULOCYTES
Immature Retic Fract: 10 % (ref 2.3–15.9)
RBC.: 4.33 MIL/uL (ref 3.87–5.11)
Retic Count, Absolute: 92.7 10*3/uL (ref 19.0–186.0)
Retic Ct Pct: 2.1 % (ref 0.4–3.1)

## 2023-09-28 LAB — BLOOD GAS, VENOUS
Acid-Base Excess: 0.6 mmol/L (ref 0.0–2.0)
Bicarbonate: 26 mmol/L (ref 20.0–28.0)
O2 Saturation: 73.2 %
Patient temperature: 34.8
pCO2, Ven: 40 mm[Hg] — ABNORMAL LOW (ref 44–60)
pH, Ven: 7.41 (ref 7.25–7.43)
pO2, Ven: 36 mm[Hg] (ref 32–45)

## 2023-09-28 LAB — CK: Total CK: 109 U/L (ref 38–234)

## 2023-09-28 LAB — TSH: TSH: 2.409 u[IU]/mL (ref 0.350–4.500)

## 2023-09-28 LAB — MAGNESIUM: Magnesium: 1.9 mg/dL (ref 1.7–2.4)

## 2023-09-28 LAB — PROCALCITONIN: Procalcitonin: <0.1 ng/mL

## 2023-09-28 LAB — IRON AND TIBC
Iron: 64 ug/dL (ref 28–170)
Saturation Ratios: 16 % (ref 10.4–31.8)
TIBC: 398 ug/dL (ref 250–450)
UIBC: 334 ug/dL

## 2023-09-28 LAB — CBG MONITORING, ED: Glucose-Capillary: 215 mg/dL — ABNORMAL HIGH (ref 70–99)

## 2023-09-28 LAB — GLUCOSE, CAPILLARY: Glucose-Capillary: 136 mg/dL — ABNORMAL HIGH (ref 70–99)

## 2023-09-28 LAB — FERRITIN: Ferritin: 122 ng/mL (ref 11–307)

## 2023-09-28 LAB — SEDIMENTATION RATE: Sed Rate: 45 mm/h — ABNORMAL HIGH (ref 0–22)

## 2023-09-28 LAB — HEMOGLOBIN A1C
Hgb A1c MFr Bld: 7.2 % — ABNORMAL HIGH (ref 4.8–5.6)
Mean Plasma Glucose: 159.94 mg/dL

## 2023-09-28 LAB — PREALBUMIN: Prealbumin: 22 mg/dL (ref 18–38)

## 2023-09-28 LAB — C-REACTIVE PROTEIN: CRP: 1 mg/dL — ABNORMAL HIGH (ref <1.0)

## 2023-09-28 LAB — PHOSPHORUS: Phosphorus: 2.6 mg/dL (ref 2.5–4.6)

## 2023-09-28 MED ORDER — ACETAMINOPHEN 650 MG RE SUPP: 650.0000 mg | Freq: Four times a day (QID) | RECTAL | Status: DC | PRN

## 2023-09-28 MED ORDER — LATANOPROST 0.005 % OP SOLN
1.0000 [drp] | Freq: Every day | OPHTHALMIC | Status: DC
  Administered 2023-09-28 – 2023-09-30 (×3): 1 [drp] via OPHTHALMIC
  Filled 2023-09-28: qty 2.5

## 2023-09-28 MED ORDER — SODIUM CHLORIDE 0.9 % IV SOLN
2.0000 g | INTRAVENOUS | Status: DC
  Administered 2023-09-28 – 2023-10-01 (×3): 2 g via INTRAVENOUS
  Filled 2023-09-28 (×3): qty 20

## 2023-09-28 MED ORDER — VANCOMYCIN HCL 1.5 G IV SOLR
1500.0000 mg | Freq: Two times a day (BID) | INTRAVENOUS | Status: DC
  Administered 2023-09-29: 1500 mg via INTRAVENOUS
  Filled 2023-09-28 (×2): qty 30

## 2023-09-28 MED ORDER — SODIUM CHLORIDE 0.9 % IV SOLN: INTRAVENOUS | Status: AC

## 2023-09-28 MED ORDER — HYDROCODONE-ACETAMINOPHEN 5-325 MG PO TABS
1.0000 | ORAL_TABLET | ORAL | Status: DC | PRN
Start: 2023-09-28 — End: 2023-10-01
  Administered 2023-09-29: 2 via ORAL
  Administered 2023-09-30: 1 via ORAL
  Filled 2023-09-28: qty 2
  Filled 2023-09-28 (×2): qty 1
  Filled 2023-09-28: qty 2

## 2023-09-28 MED ORDER — FENTANYL CITRATE PF 50 MCG/ML IJ SOSY: 12.5000 ug | PREFILLED_SYRINGE | INTRAMUSCULAR | Status: DC | PRN

## 2023-09-28 MED ORDER — ONDANSETRON HCL 4 MG PO TABS: 4.0000 mg | ORAL_TABLET | Freq: Four times a day (QID) | ORAL | Status: DC | PRN

## 2023-09-28 MED ORDER — ROSUVASTATIN CALCIUM 5 MG PO TABS
10.0000 mg | ORAL_TABLET | Freq: Every day | ORAL | Status: DC
  Administered 2023-09-29 – 2023-10-01 (×3): 10 mg via ORAL
  Filled 2023-09-28 (×3): qty 2

## 2023-09-28 MED ORDER — ONDANSETRON HCL 4 MG/2ML IJ SOLN
4.0000 mg | Freq: Four times a day (QID) | INTRAMUSCULAR | Status: DC | PRN
Start: 2023-09-28 — End: 2023-10-01

## 2023-09-28 MED ORDER — VANCOMYCIN HCL IN DEXTROSE 1-5 GM/200ML-% IV SOLN
1000.0000 mg | Freq: Once | INTRAVENOUS | Status: AC
  Administered 2023-09-28: 1000 mg via INTRAVENOUS
  Filled 2023-09-28: qty 200

## 2023-09-28 MED ORDER — MIRABEGRON ER 25 MG PO TB24
25.0000 mg | ORAL_TABLET | Freq: Every day | ORAL | Status: DC
  Administered 2023-09-29 – 2023-10-01 (×3): 25 mg via ORAL
  Filled 2023-09-28 (×3): qty 1

## 2023-09-28 MED ORDER — CARVEDILOL 6.25 MG PO TABS
3.1250 mg | ORAL_TABLET | Freq: Two times a day (BID) | ORAL | Status: DC
Start: 2023-09-29 — End: 2023-10-01
  Administered 2023-09-29 – 2023-10-01 (×4): 3.125 mg via ORAL
  Filled 2023-09-28 (×4): qty 1

## 2023-09-28 MED ORDER — SODIUM CHLORIDE 0.9 % IV SOLN
1500.0000 mg | Freq: Once | INTRAVENOUS | Status: AC
  Administered 2023-09-28: 1500 mg via INTRAVENOUS
  Filled 2023-09-28: qty 30

## 2023-09-28 MED ORDER — INSULIN ASPART 100 UNIT/ML IJ SOLN
0.0000 [IU] | INTRAMUSCULAR | Status: DC
  Administered 2023-09-28: 3 [IU] via SUBCUTANEOUS
  Administered 2023-09-29 (×2): 1 [IU] via SUBCUTANEOUS
  Administered 2023-09-29: 2 [IU] via SUBCUTANEOUS
  Administered 2023-09-30: 3 [IU] via SUBCUTANEOUS
  Administered 2023-09-30 (×2): 1 [IU] via SUBCUTANEOUS
  Administered 2023-09-30: 2 [IU] via SUBCUTANEOUS

## 2023-09-28 MED ORDER — GABAPENTIN 100 MG PO CAPS
100.0000 mg | ORAL_CAPSULE | Freq: Every day | ORAL | Status: DC
  Administered 2023-09-28 – 2023-09-30 (×3): 100 mg via ORAL
  Filled 2023-09-28 (×3): qty 1

## 2023-09-28 MED ORDER — INSULIN NPH (HUMAN) (ISOPHANE) 100 UNIT/ML ~~LOC~~ SUSP
15.0000 [IU] | Freq: Every day | SUBCUTANEOUS | Status: DC
  Filled 2023-09-28: qty 10

## 2023-09-28 MED ORDER — ACETAMINOPHEN 325 MG PO TABS
650.0000 mg | ORAL_TABLET | Freq: Four times a day (QID) | ORAL | Status: DC | PRN
Start: 2023-09-28 — End: 2023-10-01
  Administered 2023-10-01: 650 mg via ORAL
  Filled 2023-09-28: qty 2

## 2023-09-28 NOTE — Assessment & Plan Note (Signed)
Will need to follow up with nutrition as an out pt

## 2023-09-28 NOTE — Telephone Encounter (Signed)
Patient called my cell this morning (she has my number from when I called her over the weekend when I was on-call). Patient states that she is tired and wanted to discuss what occurred yesterday. She states that she was asleep and was woken up in the dark by a doctor she did not know and was told that she needed to have her toe amputated. She became emotional and states she needed time to process what was going on. She then goes on to state that she was accused of saying she was going to kill herself. She states she would never have any intentions of this and she would never try to do this. She stated again that she needed time to process, pray and talk to god. She is nervous to even come to the office because she is fearful of being "locked up". She kept reinerating that she is not suicidal.   She states that she was having issues in the hospital and she kept being woken up. After a long discussion over the phone with her I will see her in the office this afternoon if she does not go back to the hospital. I let her know my recommendation is to go back to the hospital for IV antibiotics, and get an ID consult for long term antibiotics. Discussed possible bone biopsy as well. She kept talking about she does not know what is causing the infection. I tried to discuss with her.   Patient kept talking about her previous experience at [facility] and the issues she has been having with her foot since the start, before even having her toe amputated.  She is going to think about what she wants to do.   -  Of note, her son contacted me yesterday morning stating that his mom was lashing out and was upset. I was in the clinic and not on-call for the hospital. I reached out to the hospitalist. See their note. Her sister also called me in the afternoon after the patient left AMA. I let her know that I can see her in the office Tuesday. I tried to call the patient and she hung up on me and the staff on 2 occasions. Her sister  states that their dad had a similar issue when he needed his foot amputated and refused and ended up dying.  She is going to contact the patient to let her know of the appointment with me at 1:45 09/28/23.   I spent 27 minutes on the phone with the patient.   Vivi Barrack

## 2023-09-28 NOTE — Progress Notes (Signed)
Pharmacy Antibiotic Note  Betty [name] is a 67 y.o. female admitted on 09/28/2023 with  osteomyelitis .  Pharmacy has been consulted for Vancomycin dosing.  Pt received vancomycin loading dose of 2500mg  (given as 1000mg  + 1500mg ) in ED  WBC 12.8, Tmax 97.8, PCT <0.1 Scr 0.59  Plan:  Vancomycin 1500mg  IV q12h (eAUC ~519)    > Goal AUC 400-550    > Check vancomycin levels at steady state  Ceftriaxone 2g IV q24h per MD  Monitor daily CBC, temp, SCr, and for clinical signs of improvement  F/u cultures and de-escalate antibiotics as able   Height: 5\' 6"  (167.6 cm) Weight: 118 kg (260 lb 2.3 oz) IBW/kg (Calculated) : 59.3  Temp (24hrs), Avg:97.8 F (36.6 C), Min:97.8 F (36.6 C), Max:97.8 F (36.6 C)  Recent Labs  Lab 09/25/23 2050 09/26/23 0047 09/26/23 0342 09/27/23 0340 09/28/23 1614  WBC 13.6* 15.2* 13.0* 10.8* 12.8*  CREATININE 0.44 0.58 0.65 0.72 0.59  LATICACIDVEN 1.3 0.7  --   --   --     Estimated Creatinine Clearance: 88 mL/min (by C-G formula based on SCr of 0.59 mg/dL).    Allergies  Allergen Reactions   Lyrica [Pregabalin] Other (See Comments)    sleepy   Tape Rash    Antimicrobials this admission: Vancomycin 12/17 >>  Ceftriaxone 12/17 >>   Dose adjustments this admission: N/A  Microbiology results: 12/17 Bld cx x2: pending  Prior to admission, blood culture from 12/14 positive for 1/2 blood cultures growing Streptococcus parasanguinis   Thank you for allowing pharmacy to be a part of this patient's care.  Wilburn Cornelia, PharmD, BCPS Clinical Pharmacist 09/28/2023 11:03 PM   Please refer to [facility] for pharmacy phone number

## 2023-09-28 NOTE — ED Notes (Signed)
ED TO INPATIENT HANDOFF REPORT  ED Nurse Name and Phone #:  Theadora Rama RN [phone]  S Name/Age/Gender Betty [name] 67 y.o. female Room/Bed: H011C/H011C  Code Status   Code Status: Full Code  Home/SNF/Other Home Patient oriented to: self, place, time, and situation Is this baseline? Yes   Triage Complete: Triage complete  Chief Complaint Osteomyelitis [facility]) [M86.9]  Triage Note Ongoing left foot 2nd toe infection. Great toe amputated on Oct. Admitted at [facility] for same complaints but pt left AMA on 12/14. Sent here today from podiatrist's office.    Allergies Allergies  Allergen Reactions   Lyrica [Pregabalin] Other (See Comments)    sleepy   Tape Rash    Level of Care/Admitting Diagnosis ED Disposition     ED Disposition  Admit   Condition  --   Comment  Hospital Area: [facility] [[id number]]  Level of Care: Med-Surg [16]  May admit patient to [facility] or [facility] if equivalent level of care is available:: No  Covid Evaluation: Asymptomatic - no recent exposure (last 10 days) testing not required  Diagnosis: Osteomyelitis [facility]) [[id number]]  Admitting Physician: Therisa Doyne [[id number]]  Attending Physician: Therisa Doyne [[id number]]  Certification:: I certify this patient will need inpatient services for at least 2 midnights  Expected Medical Readiness: 10/01/2023          B Medical/Surgery History Past Medical History:  Diagnosis Date   Arthritis    CAD, NATIVE VESSEL    a. S/p LAD stent in 2006 b. S/p DES to Lcx 08/2019 and patent LAD stent   DM (diabetes mellitus) (HCC)    Fibromyalgia    HYPERLIPIDEMIA-MIXED    HYPERTENSION, BENIGN    Hypoglycemia 07/29/2023   Neuropathy    Obesity    Osteoarthritis    Sleep apnea    Patient reported on 04/12/13   Vertigo    Past Surgical History:  Procedure Laterality Date   AMPUTATION TOE Left 07/31/2023   Procedure: LEFT HALLUX AMPUTATION;  Surgeon: Vivi Barrack, DPM;   Location: [facility] ORS;  Service: Orthopedics/Podiatry;  Laterality: Left;   BREATH TEK H PYLORI N/A 03/08/2013   Procedure: BREATH TEK H PYLORI;  Surgeon: Atilano Ina, MD;  Location: [facility];  Service: General;  Laterality: N/A;   CESAREAN SECTION     CHOLECYSTECTOMY  1996   CORONARY ANGIOPLASTY WITH STENT PLACEMENT  11/2004   DES - LAD   CORONARY STENT INTERVENTION N/A 09/06/2019   Procedure: CORONARY STENT INTERVENTION;  Surgeon: Tonny Bollman, MD;  Location: [facility] INVASIVE CV LAB;  Service: Cardiovascular;  Laterality: N/A;   CORONARY STENT PLACEMENT  11/2004   LOWER EXTREMITY ANGIOGRAPHY Left 08/02/2023   Procedure: Lower Extremity Angiography;  Surgeon: Annice Needy, MD;  Location: [facility] INVASIVE CV LAB;  Service: Cardiovascular;  Laterality: Left;   RIGHT/LEFT HEART CATH AND CORONARY ANGIOGRAPHY N/A 09/06/2019   Procedure: RIGHT/LEFT HEART CATH AND CORONARY ANGIOGRAPHY;  Surgeon: Tonny Bollman, MD;  Location: [facility] INVASIVE CV LAB;  Service: Cardiovascular;  Laterality: N/A;     A IV Location/Drains/Wounds Patient Lines/Drains/Airways Status     Active Line/Drains/Airways     Name Placement date Placement time Site Days   Peripheral IV 09/28/23 20 G Anterior;Right Hand 09/28/23  1755  Hand  less than 1            Intake/Output Last 24 hours  Intake/Output Summary (Last 24 hours) at 09/28/2023 2121 Last data filed at 09/28/2023 1902 Gross per 24  hour  Intake 1000 ml  Output --  Net 1000 ml    Labs/Imaging Results for orders placed or performed during the hospital encounter of 09/28/23 (from the past 48 hours)  CBC     Status: Abnormal   Collection Time: 09/28/23  4:14 PM  Result Value Ref Range   WBC 12.8 (H) 4.0 - 10.5 K/uL   RBC 4.34 3.87 - 5.11 MIL/uL   Hemoglobin 12.7 12.0 - 15.0 g/dL   HCT 16.1 09.6 - 04.5 %   MCV 89.4 80.0 - 100.0 fL   MCH 29.3 26.0 - 34.0 pg   MCHC 32.7 30.0 - 36.0 g/dL   RDW 40.9 81.1 - 91.4 %   Platelets 293 150 - 400 K/uL   nRBC 0.0  0.0 - 0.2 %    Comment: Performed at [facility], [address], [place], [place] [address]  Comprehensive metabolic panel     Status: Abnormal   Collection Time: 09/28/23  4:14 PM  Result Value Ref Range   Sodium 139 135 - 145 mmol/L   Potassium 4.2 3.5 - 5.1 mmol/L   Chloride 107 98 - 111 mmol/L   CO2 20 (L) 22 - 32 mmol/L   Glucose, Bld 174 (H) 70 - 99 mg/dL    Comment: Glucose reference range applies only to samples taken after fasting for at least 8 hours.   BUN 11 8 - 23 mg/dL   Creatinine, Ser 6.21 0.44 - 1.00 mg/dL   Calcium 9.5 8.9 - 30.8 mg/dL   Total Protein 7.3 6.5 - 8.1 g/dL   Albumin 3.6 3.5 - 5.0 g/dL   AST 21 15 - 41 U/L   ALT 24 0 - 44 U/L   Alkaline Phosphatase 69 38 - 126 U/L   Total Bilirubin 0.7 <1.2 mg/dL   GFR, Estimated >65 >78 mL/min    Comment: (NOTE) Calculated using the CKD-EPI Creatinine Equation (2021)    Anion gap 12 5 - 15    Comment: Performed at [facility], [address] [place], [place] [address]  Reticulocytes     Status: None   Collection Time: 09/28/23  4:14 PM  Result Value Ref Range   Retic Ct Pct 2.1 0.4 - 3.1 %   RBC. 4.33 3.87 - 5.11 MIL/uL   Retic Count, Absolute 92.7 19.0 - 186.0 K/uL   Immature Retic Fract 10.0 2.3 - 15.9 %    Comment: Performed at [facility], [address] [place], [place] [address]  Procalcitonin     Status: None   Collection Time: 09/28/23  4:14 PM  Result Value Ref Range   Procalcitonin <0.10 ng/mL    Comment:        Interpretation: PCT (Procalcitonin) <= 0.5 ng/mL: Systemic infection (sepsis) is not likely. Local bacterial infection is possible. (NOTE)       Sepsis PCT Algorithm           Lower Respiratory Tract                                      Infection PCT Algorithm    ----------------------------     ----------------------------         PCT < 0.25 ng/mL                PCT < 0.10 ng/mL          Strongly  encourage             Strongly discourage   discontinuation of  antibiotics    initiation of antibiotics    ----------------------------     -----------------------------       PCT 0.25 - 0.50 ng/mL            PCT 0.10 - 0.25 ng/mL               OR       >80% decrease in PCT            Discourage initiation of                                            antibiotics      Encourage discontinuation           of antibiotics    ----------------------------     -----------------------------         PCT >= 0.50 ng/mL              PCT 0.26 - 0.50 ng/mL               AND        <80% decrease in PCT             Encourage initiation of                                             antibiotics       Encourage continuation           of antibiotics    ----------------------------     -----------------------------        PCT >= 0.50 ng/mL                  PCT > 0.50 ng/mL               AND         increase in PCT                  Strongly encourage                                      initiation of antibiotics    Strongly encourage escalation           of antibiotics                                     -----------------------------                                           PCT <= 0.25 ng/mL                                                 OR                                        >  80% decrease in PCT                                      Discontinue / Do not initiate                                             antibiotics  Performed at [facility], [address] [place], [place] [address]   TSH     Status: None   Collection Time: 09/28/23  4:14 PM  Result Value Ref Range   TSH 2.409 0.350 - 4.500 uIU/mL    Comment: Performed by a 3rd Generation assay with a functional sensitivity of <=0.01 uIU/mL. Performed at [facility], [address] [place], [place] [address]   Magnesium     Status: None   Collection Time: 09/28/23  4:14 PM  Result Value Ref Range   Magnesium 1.9 1.7 - 2.4 mg/dL    Comment: Performed at [facility], [address], [place], [place] [address]  Phosphorus     Status: None   Collection Time: 09/28/23  4:14 PM  Result Value Ref Range   Phosphorus 2.6 2.5 - 4.6 mg/dL    Comment: Performed at [facility] [place] [facility], [address], [place], [place] [address]  CK     Status: None   Collection Time: 09/28/23  4:14 PM  Result Value Ref Range   Total CK 109 38 - 234 U/L    Comment: Performed at [facility], [address] [place], [place] [address]  Hemoglobin A1c     Status: Abnormal   Collection Time: 09/28/23  4:14 PM  Result Value Ref Range   Hgb A1c MFr Bld 7.2 (H) 4.8 - 5.6 %    Comment: (NOTE) Pre diabetes:          5.7%-6.4%  Diabetes:              >6.4%  Glycemic control for   <7.0% adults with diabetes    Mean Plasma Glucose 159.94 mg/dL    Comment: Performed at [facility], [address] [place], [place] [address]  CBG monitoring, ED     Status: Abnormal   Collection Time: 09/28/23  8:07 PM  Result Value Ref Range   Glucose-Capillary 215 (H) 70 - 99 mg/dL    Comment: Glucose reference range applies only to samples taken after fasting for at least 8 hours.   No results found.  Pending Labs Unresulted Labs (From admission, onward)     Start     Ordered   09/29/23 0500  Vitamin B12  (Anemia Panel (PNL))  Tomorrow morning,   R        09/28/23 1924   09/29/23 0500  Folate  (Anemia Panel (PNL))  Tomorrow morning,   R        09/28/23 1924   09/29/23 0500  Prealbumin  Tomorrow morning,   R        09/28/23 1924   09/28/23 2000  Iron and TIBC  Once,   AD        09/28/23 2000   09/28/23 2000  Ferritin  Once,   AD        09/28/23 2000   09/28/23 1925  Urinalysis, Complete w Microscopic -Urine, Clean Catch  Once,   R       Question Answer Comment  Release to patient Immediate   Specimen Source Urine, Clean Catch      09/28/23 1924   09/28/23 1925  Blood gas, venous  ONCE - STAT,   STAT       Question:  Release to patient  Answer:  Immediate   09/28/23 1924   Signed  and Held  Sedimentation rate  Add-on,   R        Signed and Held   Signed and Held  C-reactive protein  Add-on,   R        Signed and Held   Signed and Held  Prealbumin  Add-on,   R        Signed and Held   Signed and Held  Magnesium  Tomorrow morning,   R        Signed and Held   Signed and Held  Phosphorus  Tomorrow morning,   R        Signed and Held   Signed and Held  Comprehensive metabolic panel  Tomorrow morning,   R       Question:  Release to patient  Answer:  Immediate   Signed and Held   Signed and Held  CBC  Tomorrow morning,   R       Question:  Release to patient  Answer:  Immediate   Signed and Held            Vitals/Pain Today's Vitals   09/28/23 1533 09/28/23 1539 09/28/23 1540 09/28/23 2055  BP: 139/69     Pulse: 71     Resp: 16     Temp: 97.8 F (36.6 C)   98 F (36.7 C)  SpO2: 98%     Weight:   118 kg   Height:   5\' 6"  (1.676 m)   PainSc:  1       Isolation Precautions No active isolations  Medications Medications  insulin aspart (novoLOG) injection 0-9 Units (3 Units Subcutaneous Given 09/28/23 2024)  vancomycin (VANCOCIN) IVPB 1000 mg/200 mL premix (0 mg Intravenous Stopped 09/28/23 1902)  Vancomycin (VANCOCIN) 1,500 mg in sodium chloride 0.9 % 500 mL IVPB (1,500 mg Intravenous New Bag/Given 09/28/23 1905)    Mobility walks     Focused Assessments Pulmonary Assessment Handoff:  Lung sounds:   O2 Device: Room Air      R Recommendations: See Admitting Provider Note  Report given to:   Additional Notes:

## 2023-09-28 NOTE — Plan of Care (Addendum)
Discussed with patient in office today when she came in to see Dr. Ardelle Anton  Plan for Left 2nd toe amp and 1st met head resection 09/29/23 at 1500. No imaging needed. Iv abx pre op ok.   NPO p 0700 tomorrow AM.         Corinna Gab, DPM [facility]                   09/28/2023

## 2023-09-28 NOTE — Assessment & Plan Note (Addendum)
Blood cultures from 09/25/2023 showed STREPTOCOCCUS PARASANGUINIS  Currently on vanc Susceptibilities pending Will adjust as needed and will need repeat cultures Discussed with pharmacy will add on ceftriaxone repeat blood cultures order echo as this sometimes can be associated with endocarditis

## 2023-09-28 NOTE — ED Notes (Signed)
Per pharmacist, pt is getting 1gm and an additional 1.5gm Vancomycin.

## 2023-09-28 NOTE — Assessment & Plan Note (Signed)
As per POdaitry  Sed rate, CRP pending

## 2023-09-28 NOTE — Assessment & Plan Note (Addendum)
Appreciate podiatry consult Keep NPO post midnight  Continue vancomycin Will likely need amputation

## 2023-09-28 NOTE — Assessment & Plan Note (Signed)
Chronic stable continue Coreg 3.125 mg po BID and Crestor 1o mg po daily

## 2023-09-28 NOTE — H&P (Addendum)
Betty [name] ION:[id number] DOB: 05/14/1956 DOA: 09/28/2023     PCP: Ailene Ravel, MD   Outpatient Specialists:   CARDS: Dr. Charlton Haws, MD    POdiatry Dr. Logan Bores Dr. Ardelle Anton  Patient arrived to ER on 09/28/23 at 1533 Referred by Attending Therisa Doyne, MD   Patient coming from:    home Lives alone,       Chief Complaint:   Chief Complaint  Patient presents with   Wound Infection    HPI: Betty [name] is a 67 y.o. female with medical history significant of DM2, neuropathy hyperlipidemia hypertension morbid obesity OSA CAD status post left great toe amputation, OSA not on CPAP    Presented with   need for IV abx  Has ben bothering her for the past few months Home nurses have seen her on Tuesday and felt it ws getting worse She went to [facility] but got distraught bc she needed amputation she left AMA Went to Dr. Arsenio Katz today and he rec pt going directly to ER for IV vancomycin and NPO post midnight for possible amputation Had left great toe amputation in October 2024  Denies any fever has been checking temperature in her foot and it was 1 degree higher  No recent nausea or vomiting    -MRI with concerns for osteomyelitis of the distal and middle phalanges of the second toe on the left foot as well as possible osteomyelitis of the first metatarsal head.  Plan for  amputation of the second toe at MPJ level as well as  first metatarsal head resection.   Reports she did not take any medications yesterday bc she was mad  Reports episodes with hypoglycemia   Reports have had some pain in the toe but still able to sleep She has neuropathy  Pt seems to have poor insight would benefit from PT/OT eval   Denies significant ETOH intake   Does not smoke   Lab Results  Component Value Date   SARSCOV2NAA NOT DETECTED 09/02/2019    Regarding pertinent Chronic problems:     Hyperlipidemia -  on statins Crestor Lipid Panel     Component Value  Date/Time   CHOL 105 06/04/2023 1533   CHOL 102 01/09/2022 1438   TRIG 126 06/04/2023 1533   HDL 46 (L) 06/04/2023 1533   HDL 47 01/09/2022 1438   CHOLHDL 2.3 06/04/2023 1533   VLDL 22.0 01/09/2021 1326   LDLCALC 38 06/04/2023 1533   LDLDIRECT 48 12/11/2019 1123   LDLDIRECT 108.0 09/21/2019 0905   LABVLDL 23 01/09/2022 1438     HTN on Coreg, ramipril      CAD  - On Aspirin, statin, betablocker, Plavix                 -  followed by cardiology                - last cardiac cath 2020   09/06/19 Chest Pain  Cath showed severe subtotal occlusion of the mid-circumflex treated with PCI using a 2.25x22 mm Resolute Onyx DES. Patent LAD stent.     DM 2 -  Lab Results  Component Value Date   HGBA1C 7.8 (A) 06/04/2023   on insulin,       Morbid obesity-   BMI Readings from Last 1 Encounters:  09/28/23 41.99 kg/m         OSA  noncompliant with CPAP    Hx of CVA - Brainstem cerebellum stroke despite negative MRI  started on plavix. Carotids no significant stenosis 08/25/18  Aspirin 81 mg, , Plavix    While in ER:    Started on vancomycin     Lab Orders         CBC         Comprehensive metabolic panel     MRI - 1. Interval amputation of the great toe proximal distal phalanges compared to 07/30/2023 MRI. There is nonspecific mild marrow edema within the medial aspect of the great toe metatarsal head with minimal decreased T1 signal in this region. Question a possible thin sinus tract/soft tissue wound within bordering the dorsomedial aspect of the distal first metatarsal, findings are suspicious for early osteomyelitis. Recommend clinical correlation. 2. There is moderate second toe soft tissue swelling with possible mild ulceration at the distal toe soft tissues. Diffuse marrow edema throughout the distal phalanges of the second toe. Findings are suspicious for osteomyelitis of the middle and distal phalanges. 3. Mildly displaced oblique fracture of the proximal shaft of  the proximal phalanx of the second toe.    Following Medications were ordered in ER: Medications  Vancomycin (VANCOCIN) 1,500 mg in sodium chloride 0.9 % 500 mL IVPB (1,500 mg Intravenous New Bag/Given 09/28/23 1905)  vancomycin (VANCOCIN) IVPB 1000 mg/200 mL premix (0 mg Intravenous Stopped 09/28/23 1902)    _______________________________________________________ ER Provider Called:   Podiatry    Dr.Standiford They Recommend admit to medicine   Will see in AM   start on abx NPO post midnight     ED Triage Vitals  Encounter Vitals Group     BP 09/28/23 1533 139/69     Systolic BP Percentile --      Diastolic BP Percentile --      Pulse Rate 09/28/23 1533 71     Resp 09/28/23 1533 16     Temp 09/28/23 1533 97.8 F (36.6 C)     Temp src --      SpO2 09/28/23 1533 98 %     Weight 09/28/23 1540 260 lb 2.3 oz (118 kg)     Height 09/28/23 1540 5\' 6"  (1.676 m)     Head Circumference --      Peak Flow --      Pain Score 09/28/23 1539 1     Pain Loc --      Pain Education --      Exclude from Growth Chart --   ZOXW(96)@     _________________________________________ Significant initial  Findings: Abnormal Labs Reviewed  CBC - Abnormal; Notable for the following components:      Result Value   WBC 12.8 (*)    All other components within normal limits  COMPREHENSIVE METABOLIC PANEL - Abnormal; Notable for the following components:   CO2 20 (*)    Glucose, Bld 174 (*)    All other components within normal limits    Cardiac Panel (last 3 results) Recent Labs    09/28/23 1614  CKTOTAL 109    ECG: Ordered Personally reviewed and interpreted by me showing: HR : 66 Rhythm: Normal sinus rhythm Nonspecific ST abnormality Abnormal ECG QTC 438      The recent clinical data is shown below. Vitals:   09/28/23 1533 09/28/23 1540  BP: 139/69   Pulse: 71   Resp: 16   Temp: 97.8 F (36.6 C)   SpO2: 98%   Weight:  118 kg  Height:  5\' 6"  (1.676 m)    WBC     Component  Value Date/Time   WBC 12.8 (H) 09/28/2023 1614   LYMPHSABS 3.8 09/25/2023 2050   LYMPHSABS 3.4 (H) 09/01/2019 1614   MONOABS 0.9 09/25/2023 2050   EOSABS 0.3 09/25/2023 2050   EOSABS 0.2 09/01/2019 1614   BASOSABS 0.0 09/25/2023 2050   BASOSABS 0.0 09/01/2019 1614     Lactic Acid, Venous    Component Value Date/Time   LATICACIDVEN 0.7 09/26/2023 0047     Procalcitonin   Ordered      UA  ordered    Results for orders placed or performed during the hospital encounter of 09/25/23  Culture, blood (routine x 2)     Status: Abnormal (Preliminary result)   Collection Time: 09/25/23  8:50 PM   Specimen: BLOOD  Result Value Ref Range Status   Specimen Description   Final    BLOOD RIGHT ANTECUBITAL Performed at [facility], [address] [place], [place] [address]    Special Requests   Final    BOTTLES DRAWN AEROBIC AND ANAEROBIC Blood Culture results may not be optimal due to an inadequate volume of blood received in culture bottles Performed at [facility], [address], [place], [place] [address]    Culture (A)  Final    STREPTOCOCCUS PARASANGUINIS SUSCEPTIBILITIES TO FOLLOW CRITICAL RESULT CALLED TO, READ BACK BY AND VERIFIED WITH: RN P.DOWD AT 1218 ON 09/28/2023 BY T.SAAD. Performed at [facility], [address] [place], [place] [address]    Report Status PENDING  Incomplete  Culture, blood (routine x 2)     Status: None (Preliminary result)   Collection Time: 09/25/23  8:55 PM   Specimen: BLOOD LEFT FOREARM  Result Value Ref Range Status   Specimen Description   Final    BLOOD LEFT FOREARM Performed at [facility], [address], [place], [place] [address]    Special Requests   Final    BOTTLES DRAWN AEROBIC AND ANAEROBIC Blood Culture results may not be optimal due to an inadequate volume of blood received in culture bottles Performed at [facility], [address] [place], [place]  [address]    Culture   Final    NO GROWTH 3 DAYS Performed at [facility], [address] [place], [place] [address]    Report Status PENDING  Incomplete    ABX started Antibiotics Given (last 72 hours)     Date/Time Action Medication Dose Rate   09/28/23 1800 New Bag/Given   vancomycin (VANCOCIN) IVPB 1000 mg/200 mL premix 1,000 mg 200 mL/hr   09/28/23 1905 New Bag/Given   Vancomycin (VANCOCIN) 1,500 mg in sodium chloride 0.9 % 500 mL IVPB 1,500 mg 250 mL/hr       Susceptibility data from last 90 days. Collected Specimen Info Organism Ciprofloxacin Clindamycin Erythromycin Gentamicin Susc lslt Inducible Clindamycin Linezolid Oxacillin Rifampin TELAVANCIN TETRACYCLINE Trimethoprim/Sulfa  07/31/23 Tissue from Wound Staphylococcus aureus  S  S  S  S  S  S  S  S  S  S  S     _____  __________________________________________________________ Recent Labs  Lab 09/25/23 2050 09/26/23 0047 09/26/23 0342 09/27/23 0340 09/28/23 1614  NA 138  --  138 136 139  K 3.8  --  3.8 4.0 4.2  CO2 24  --  24 22 20*  GLUCOSE 86  --  123* 145* 174*  BUN 14  --  14 15 11   CREATININE 0.44 0.58 0.65 0.72 0.59  CALCIUM  9.3  --  8.8* 9.1 9.5    Cr   stable,  Lab Results  Component Value Date   CREATININE 0.59 09/28/2023   CREATININE 0.72 09/27/2023   CREATININE 0.65 09/26/2023    Recent Labs  Lab 09/26/23 0342 09/28/23 1614  AST 20 21  ALT 23 24  ALKPHOS 69 69  BILITOT 0.9 0.7  PROT 6.8 7.3  ALBUMIN 3.6 3.6   Lab Results  Component Value Date   CALCIUM 9.5 09/28/2023   PHOS 2.7 08/02/2023    Plt: Lab Results  Component Value Date   PLT 293 09/28/2023       Recent Labs  Lab 09/25/23 2050 09/26/23 0047 09/26/23 0342 09/27/23 0340 09/28/23 1614  WBC 13.6* 15.2* 13.0* 10.8* 12.8*  NEUTROABS 8.7*  --   --   --   --   HGB 13.1 12.3 11.8* 12.1 12.7  HCT 40.8 37.1 37.6 38.4 38.8  MCV 91.7 91.6 93.1 92.8 89.4  PLT 298 267 265 275 293    HG/HCT   stable,       Component Value Date/Time   HGB 12.7 09/28/2023 1614   HGB 12.9 09/01/2019 1614   HCT 38.8 09/28/2023 1614   HCT 37.6 09/01/2019 1614   MCV 89.4 09/28/2023 1614   MCV 87 09/01/2019 1614    _______________________________________________ Hospitalist was called for admission for   Left foot osteomyelitis   The following Work up has been ordered so far:  Orders Placed This Encounter  Procedures   CBC   Comprehensive metabolic panel   Diet NPO time specified Except for: Sips with Meds   Consult to podiatry Consult Timeframe: STAT - requires a response within one hour; STAT timeframe requires provider to provider communication, has the provider to provider communication been completed: Yes; Reason for Consult? [id number] left foot amput...   Consult for [facility] Admission   Saline lock IV   Admit to Inpatient (patient's expected length of stay will be greater than 2 midnights or inpatient only procedure)     OTHER Significant initial  Findings:  labs showing:     DM  labs:  HbA1C: Recent Labs    10/01/22 1356 06/04/23 1619  HGBA1C 7.4* 7.8*       CBG (last 3)  Recent Labs    09/26/23 2158 09/27/23 0812 09/27/23 1157  GLUCAP 199* 168* 160*          Cultures:    Component Value Date/Time   SDES  09/25/2023 2055    BLOOD LEFT FOREARM Performed at [facility], [address], [place], [place] [address]    SPECREQUEST  09/25/2023 2055    BOTTLES DRAWN AEROBIC AND ANAEROBIC Blood Culture results may not be optimal due to an inadequate volume of blood received in culture bottles Performed at [facility], [address], [place], [place] [address]    CULT  09/25/2023 2055    NO GROWTH 3 DAYS Performed at [facility], [address] [place], [place] [address]    REPTSTATUS PENDING 09/25/2023 2055     Radiological Exams on Admission: No results  found. _______________________________________________________________________________________________________ Latest  Blood pressure 139/69, pulse 71, temperature 97.8 F (36.6 C), resp. rate 16, height 5\' 6"  (1.676 m), weight 118 kg, SpO2 98%.   Vitals  labs and radiology finding personally reviewed  Review of Systems:    Pertinent positives include: foot pain  Constitutional:  No weight loss, night sweats, Fevers, chills, fatigue,  weight loss  HEENT:  No headaches, Difficulty swallowing,Tooth/dental problems,Sore throat,  No sneezing, itching, ear ache, nasal congestion, post nasal drip,  Cardio-vascular:  No chest pain, Orthopnea, PND, anasarca, dizziness, palpitations.no Bilateral lower extremity swelling  GI:  No heartburn, indigestion, abdominal pain, nausea, vomiting, diarrhea, change in bowel habits, loss of appetite, melena, blood in stool, hematemesis Resp:  no shortness of breath at rest. No dyspnea on exertion, No excess mucus, no productive cough, No non-productive cough, No coughing up of blood.No change in color of mucus.No wheezing. Skin:  no rash or lesions. No jaundice GU:  no dysuria, change in color of urine, no urgency or frequency. No straining to urinate.  No flank pain.  Musculoskeletal:  No joint pain or no joint swelling. No decreased range of motion. No back pain.  Psych:  No change in mood or affect. No depression or anxiety. No memory loss.  Neuro: no localizing neurological complaints, no tingling, no weakness, no double vision, no gait abnormality, no slurred speech, no confusion  All systems reviewed and apart from HOPI all are negative _______________________________________________________________________________________________ Past Medical History:   Past Medical History:  Diagnosis Date   Arthritis    CAD, NATIVE VESSEL    a. S/p LAD stent in 2006 b. S/p DES to Lcx 08/2019 and patent LAD stent   DM (diabetes mellitus) (HCC)     Fibromyalgia    HYPERLIPIDEMIA-MIXED    HYPERTENSION, BENIGN    Hypoglycemia 07/29/2023   Neuropathy    Obesity    Osteoarthritis    Sleep apnea    Patient reported on 04/12/13   Vertigo       Past Surgical History:  Procedure Laterality Date   AMPUTATION TOE Left 07/31/2023   Procedure: LEFT HALLUX AMPUTATION;  Surgeon: Vivi Barrack, DPM;  Location: [facility] ORS;  Service: Orthopedics/Podiatry;  Laterality: Left;   BREATH TEK H PYLORI N/A 03/08/2013   Procedure: BREATH TEK H PYLORI;  Surgeon: Atilano Ina, MD;  Location: [facility];  Service: General;  Laterality: N/A;   CESAREAN SECTION     CHOLECYSTECTOMY  1996   CORONARY ANGIOPLASTY WITH STENT PLACEMENT  11/2004   DES - LAD   CORONARY STENT INTERVENTION N/A 09/06/2019   Procedure: CORONARY STENT INTERVENTION;  Surgeon: Tonny Bollman, MD;  Location: [facility] INVASIVE CV LAB;  Service: Cardiovascular;  Laterality: N/A;   CORONARY STENT PLACEMENT  11/2004   LOWER EXTREMITY ANGIOGRAPHY Left 08/02/2023   Procedure: Lower Extremity Angiography;  Surgeon: Annice Needy, MD;  Location: [facility] INVASIVE CV LAB;  Service: Cardiovascular;  Laterality: Left;   RIGHT/LEFT HEART CATH AND CORONARY ANGIOGRAPHY N/A 09/06/2019   Procedure: RIGHT/LEFT HEART CATH AND CORONARY ANGIOGRAPHY;  Surgeon: Tonny Bollman, MD;  Location: [facility] INVASIVE CV LAB;  Service: Cardiovascular;  Laterality: N/A;    Social History:  Ambulatory   independently  walker      reports that she has never smoked. She has never used smokeless tobacco. She reports that she does not drink alcohol and does not use drugs.     Family History:   Family History  Problem Relation Age of Onset   Heart disease Mother    Cancer Mother        ovarian   Arthritis Mother    Diabetes Mother    Hypertension Mother    Hypertension Father    Stroke Father    Diabetes Sister    Cancer Other    Coronary artery disease Other    Diabetes  Other     ______________________________________________________________________________________________ Allergies: Allergies  Allergen Reactions   Lyrica [Pregabalin] Other (See Comments)    sleepy   Tape Rash     Prior to Admission medications   Medication Sig Start Date End Date Taking? Authorizing Provider  acetaminophen (TYLENOL) 650 MG CR tablet Take 650-1,300 mg by mouth every 8 (eight) hours as needed for pain.     [provider]  aspirin EC 81 MG tablet Take 81 mg by mouth daily.    [provider]  beta carotene 91478 UNIT capsule Take 25,000 Units by mouth daily.      [provider]  carvedilol (COREG) 3.125 MG tablet Take 1 tablet (3.125 mg total) by mouth 2 (two) times daily with a meal. 08/27/23   Wendall Stade, MD  Cholecalciferol (VITAMIN D3) 250 MCG (10000 UT) capsule Take 10,000 Units by mouth 2 (two) times daily.    [provider]  clopidogrel (PLAVIX) 75 MG tablet TAKE ONE TABLET BY MOUTH EVERY DAY 03/22/23   Wendall Stade, MD  Continuous Blood Gluc Sensor (FREESTYLE LIBRE 3 SENSOR) MISC 1 each by Does not apply route every 14 (fourteen) days. 10/01/22   Carlus Pavlov, MD  estradiol (ESTRACE) 0.1 MG/GM vaginal cream Place 0.25 Applicatorfuls vaginally 3 (three) times a week. 02/10/23 02/10/24  Linzie Collin, MD  gabapentin (NEURONTIN) 100 MG capsule TAKE 1 CAPSULE BY MOUTH TWICE DAILY Patient taking differently: Take 100 mg by mouth at bedtime. 01/04/23   Carlus Pavlov, MD  Homeopathic Products [facility] RELIEF EX) Apply 1 application topically 3 (three) times daily as needed (pain.).    [provider]  icosapent Ethyl (VASCEPA) 1 g capsule Take 2 capsules (2 g total) by mouth 2 (two) times daily. Patient taking differently: Take 1 g by mouth 2 (two) times daily. 04/13/22   Wendall Stade, MD  insulin NPH Human (NOVOLIN N) 100 UNIT/ML injection INJECT 8 UNITS SUBCUTANEOUSLY TWO TIMES A DAY Patient taking differently:  Inject 36 Units into the skin at bedtime. 08/03/23   Pennie Banter, DO  insulin regular (NOVOLIN R) 100 units/mL injection INJECT 5 units into the skin THREE TIMES daily Patient taking differently: Inject into the skin 2 (two) times daily with a meal. Insulin R: 22 >> 20 units with a  smaller meal and  24 >> 22 units wiith a  regular meal 71-90: take 16 units of R insulin 50-70: do not take R insulin 08/03/23   Esaw Grandchild A, DO  Insulin Syringe-Needle U-100 (BD VEO INSULIN SYRINGE U/F) 31G X 15/64" 1 ML MISC USE 4 TIMES DAILY AS DIRECTED 03/01/23   Carlus Pavlov, MD  latanoprost (XALATAN) 0.005 % ophthalmic solution Apply to eye. 09/14/22   [provider]  Magnesium Oxide -Mg Supplement ([employer] MAGNESIUM OXIDE) 250 MG TABS Take by mouth.    [provider]  Menthol, Topical Analgesic, (ZIMS MAX-FREEZE EX) Apply 1 application topically 4 (four) times daily as needed (pain).    [provider]  metFORMIN (GLUCOPHAGE-XR) 500 MG 24 hr tablet TAKE 1 TABLET BY MOUTH 3 TIMES DAILY WITH MEALS Patient taking differently: 2 (two) times daily with a meal. TAKE 1 TABLET BY MOUTH 3 TIMES DAILY WITH MEALS 03/05/23   Carlus Pavlov, MD  Misc Natural Products (OSTEO BI-FLEX JOINT SHIELD PO) Take 1 tablet by mouth 2 (two) times daily.    [provider]  MYRBETRIQ 25 MG TB24 tablet Take 25 mg by mouth daily.  [provider]  nitroGLYCERIN (NITROSTAT) 0.4 MG SL tablet PLACE 1 TABLET UNDER THE TONGUE EVERY 5 MINUTES AS NEEDED FOR CHEST PAIN UP TO 3 DOSES, IF SYMPTOMS PERSIST CALL 911 12/05/21   Wendall Stade, MD  OZEMPIC, 1 MG/DOSE, 4 MG/3ML SOPN INJECT 1MG  SUBCUTANEOUSLY  ONCE A WEEK Patient taking differently: Inject 1 mg into the skin once a week. INJECT 1MG  SUBCUTANEOUSLY  ONCE A WEEK ON SUNDAYS 05/24/23   Carlus Pavlov, MD  ramipril (ALTACE) 10 MG capsule TAKE 1 CAPSULE BY MOUTH DAILY 01/26/23   Carlus Pavlov, MD  rosuvastatin (CRESTOR) 10 MG  tablet Take 1 tablet (10 mg total) by mouth daily. 12/31/22   Carlus Pavlov, MD  ULTRATRAK PRO TEST test strip TEST BLOOD SUGAR 5 TIMES DAILY AS INSTRUCTED 10/10/18   Carlus Pavlov, MD    ___________________________________________________________________________________________________ Physical Exam:    09/28/2023    3:40 PM 09/28/2023    3:33 PM 09/27/2023    3:58 AM  Vitals with BMI  Height 5\' 6"     Weight 260 lbs 2 oz    BMI 42.01    Systolic  139 146  Diastolic  69 67  Pulse  71 68     1. General:  in No  Acute distress   Chronically ill   -appearing 2. Psychological: Alert and   Oriented to self a bit confused regarding the situation 3. Head/ENT:    Dry Mucous Membranes                          Head Non traumatic, neck supple                          Poor Dentition 4. SKIN:  decreased Skin turgor,  Skin clean Dry redness noted over left toe    5. Heart: Regular rate and rhythm no  Murmur, no Rub or gallop 6. Lungs no wheezes or crackles   7. Abdomen: Soft,  non-tender, Non distended bowel sounds present 8. Lower extremities: no clubbing, cyanosis, no  edema 9. Neurologically Grossly intact, moving all 4 extremities equally   10. MSK: Normal range of motion    Chart has been reviewed  ______________________________________________________________________________________________  Assessment/Plan 67 y.o. female with medical history significant of DM2, neuropathy hyperlipidemia hypertension morbid obesity OSA CAD status post left great toe amputation, OSA not on CPAP   Admitted for   Left foot osteomyelitis   Present on Admission:  Osteomyelitis (HCC)  Acute osteomyelitis of toe, left (HCC)  Diabetic foot ulcer_left great toe  Poorly controlled type 2 diabetes mellitus with circulatory disorder (HCC)  CAD, NATIVE VESSEL  Hyperlipidemia  HYPERTENSION, BENIGN  Sleep apnea  Bacteremia     Acute osteomyelitis of toe, left (HCC) Appreciate podiatry  consult Keep NPO post midnight  Continue vancomycin Will likely need amputation   Diabetic foot ulcer_left great toe As per POdaitry  Sed rate, CRP pending  Poorly controlled type 2 diabetes mellitus with circulatory disorder (HCC)  - Order Sensitive   SSI   - continue home insulin but decreased to NPH 15 units, at bedtime given episodes of hypoglycemia  -  check TSH and HgA1C  - Hold by mouth medications    CAD, NATIVE VESSEL Chronic stable continue Coreg 3.125 mg po BID and Crestor 1o mg po daily  Hyperlipidemia Continue Crestor 10 mg po q day  HYPERTENSION, BENIGN Continue home meds including coreg 3.125 mg  po BID  Morbid obesity with BMI of 40.0-44.9, adult (HCC) Will need to follow up with nutrition as an out pt  Sleep apnea Does not use CPAP  Bacteremia Blood cultures from 09/25/2023 showed STREPTOCOCCUS PARASANGUINIS  Currently on vanc Susceptibilities pending Will adjust as needed and will need repeat cultures Discussed with pharmacy will add on ceftriaxone repeat blood cultures order echo as this sometimes can be associated with endocarditis   Other plan as per orders.  DVT prophylaxis:  SCD       Code Status:    Code Status: Prior FULL CODE  as per patient   I had personally discussed CODE STATUS with patient    ACP   none   Family Communication:   Family not at  Bedside    Diet  Diet Orders (From admission, onward)     Start     Ordered   09/29/23 0700  Diet NPO time specified Except for: Sips with Meds  Diet effective ____       Question:  Except for  Answer:  Sips with Meds   09/28/23 1635            Disposition Plan:    To home once workup is complete and patient is stable   Following barriers for discharge:                                                         Pain controlled with PO medications                                                          Will need consultants to evaluate patient prior to discharge                          Would benefit from PT/OT eval prior to DC  Ordered                                    Wound care  consulted                   Consults called: Podiatry is aware     Admission status:  ED Disposition     ED Disposition  Admit   Condition  --   Comment  Hospital Area: [facility] [[id number]]  Level of Care: Med-Surg [[id number]]  May admit patient to [facility] or [facility] if equivalent level of care is available:: No  Covid Evaluation: Asymptomatic - no recent exposure (last 10 days) testing not required  Diagnosis: Osteomyelitis [facility]) [[id number]]  Admitting Physician: Therisa Doyne [[id number]]  Attending Physician: Therisa Doyne [[id number]]  Certification:: I certify this patient will need inpatient services for at least 2 midnights  Expected Medical Readiness: 10/01/2023            inpatient     I Expect 2 midnight stay secondary to severity of patient's current illness need for inpatient interventions justified by the following:  Severe lab/radiological/exam abnormalities including:  osteomyelitis  and extensive comorbidities including:  DM2    That are currently affecting medical management.   I expect  patient to be hospitalized for 2 midnights requiring inpatient medical care.  Patient is at high risk for adverse outcome (such as loss of life or disability) if not treated.  Indication for inpatient stay as follows:   Need for operative/procedural  intervention    Need for IV antibiotics, IV fluids,     Level of care    medical floor    Lab Results  Component Value Date   SARSCOV2NAA NOT DETECTED 09/02/2019     Averey Koning 09/28/2023, 9:29 PM    [facility]     after 2 AM please page floor coverage PA If 7AM-7PM, please contact the day team taking care of the patient using [email or web address]

## 2023-09-28 NOTE — Assessment & Plan Note (Signed)
Continue Crestor 10 mgpo q day

## 2023-09-28 NOTE — Assessment & Plan Note (Signed)
Does not use CPAP. 

## 2023-09-28 NOTE — ED Provider Notes (Addendum)
[facility] EMERGENCY DEPARTMENT AT [facility] Provider Note   CSN: [id number] Arrival date & time: 09/28/23  1533     History  Chief Complaint  Patient presents with   Wound Infection    Betty [name] is a 67 y.o. female.  Patient sent in from podiatry office Dr. Annamary Rummage.  Patient was admitted over at [facility] long but then left AMA.  She left on the 14th.  She went back to follow-up with them in the office.  They have sent her here for admission and they are planning of left second toe amputation and first metatarsal head resection.  Patient had x-rays done on the 14th podiatry said does not need additional x-rays.  Patient with basic labs here.  Patient nontoxic no acute distress.  Vital signs temp 97.8 pulse 71 respiration 16 blood pressure 138/69 oxygen sats 98%.  Patient is to be n.p.o. for surgery tomorrow.  Past medical history is significant for hypertension hyperlipidemia coronary artery disease native vessel diabetes fibromyalgia hypoglycemia neuropathy.       Home Medications Prior to Admission medications   Medication Sig Start Date End Date Taking? Authorizing Provider  acetaminophen (TYLENOL) 650 MG CR tablet Take 650-1,300 mg by mouth every 8 (eight) hours as needed for pain.     [provider]  aspirin EC 81 MG tablet Take 81 mg by mouth daily.    [provider]  beta carotene 95284 UNIT capsule Take 25,000 Units by mouth daily.      [provider]  carvedilol (COREG) 3.125 MG tablet Take 1 tablet (3.125 mg total) by mouth 2 (two) times daily with a meal. 08/27/23   Wendall Stade, MD  Cholecalciferol (VITAMIN D3) 250 MCG (10000 UT) capsule Take 10,000 Units by mouth 2 (two) times daily.    [provider]  clopidogrel (PLAVIX) 75 MG tablet TAKE ONE TABLET BY MOUTH EVERY DAY 03/22/23   Wendall Stade, MD  Continuous Blood Gluc Sensor (FREESTYLE LIBRE 3 SENSOR) MISC 1 each by Does not apply route every 14  (fourteen) days. 10/01/22   Carlus Pavlov, MD  estradiol (ESTRACE) 0.1 MG/GM vaginal cream Place 0.25 Applicatorfuls vaginally 3 (three) times a week. 02/10/23 02/10/24  Linzie Collin, MD  gabapentin (NEURONTIN) 100 MG capsule TAKE 1 CAPSULE BY MOUTH TWICE DAILY Patient taking differently: Take 100 mg by mouth at bedtime. 01/04/23   Carlus Pavlov, MD  Homeopathic Products [facility] RELIEF EX) Apply 1 application topically 3 (three) times daily as needed (pain.).    [provider]  icosapent Ethyl (VASCEPA) 1 g capsule Take 2 capsules (2 g total) by mouth 2 (two) times daily. Patient taking differently: Take 1 g by mouth 2 (two) times daily. 04/13/22   Wendall Stade, MD  insulin NPH Human (NOVOLIN N) 100 UNIT/ML injection INJECT 8 UNITS SUBCUTANEOUSLY TWO TIMES A DAY Patient taking differently: Inject 36 Units into the skin at bedtime. 08/03/23   Pennie Banter, DO  insulin regular (NOVOLIN R) 100 units/mL injection INJECT 5 units into the skin THREE TIMES daily Patient taking differently: Inject into the skin 2 (two) times daily with a meal. Insulin R: 22 >> 20 units with a  smaller meal and  24 >> 22 units wiith a  regular meal 71-90: take 16 units of R insulin 50-70: do not take R insulin 08/03/23   Esaw Grandchild A, DO  Insulin Syringe-Needle U-100 (BD VEO INSULIN SYRINGE U/F) 31G X 15/64" 1 ML  MISC USE 4 TIMES DAILY AS DIRECTED 03/01/23   Carlus Pavlov, MD  latanoprost (XALATAN) 0.005 % ophthalmic solution Apply to eye. 09/14/22   [provider]  Magnesium Oxide -Mg Supplement ([employer] MAGNESIUM OXIDE) 250 MG TABS Take by mouth.    [provider]  Menthol, Topical Analgesic, (ZIMS MAX-FREEZE EX) Apply 1 application topically 4 (four) times daily as needed (pain).    [provider]  metFORMIN (GLUCOPHAGE-XR) 500 MG 24 hr tablet TAKE 1 TABLET BY MOUTH 3 TIMES DAILY WITH MEALS Patient taking differently: 2 (two) times daily with a meal. TAKE 1  TABLET BY MOUTH 3 TIMES DAILY WITH MEALS 03/05/23   Carlus Pavlov, MD  Misc Natural Products (OSTEO BI-FLEX JOINT SHIELD PO) Take 1 tablet by mouth 2 (two) times daily.    [provider]  MYRBETRIQ 25 MG TB24 tablet Take 25 mg by mouth daily.    [provider]  nitroGLYCERIN (NITROSTAT) 0.4 MG SL tablet PLACE 1 TABLET UNDER THE TONGUE EVERY 5 MINUTES AS NEEDED FOR CHEST PAIN UP TO 3 DOSES, IF SYMPTOMS PERSIST CALL 911 12/05/21   Wendall Stade, MD  OZEMPIC, 1 MG/DOSE, 4 MG/3ML SOPN INJECT 1MG  SUBCUTANEOUSLY  ONCE A WEEK Patient taking differently: Inject 1 mg into the skin once a week. INJECT 1MG  SUBCUTANEOUSLY  ONCE A WEEK ON SUNDAYS 05/24/23   Carlus Pavlov, MD  ramipril (ALTACE) 10 MG capsule TAKE 1 CAPSULE BY MOUTH DAILY 01/26/23   Carlus Pavlov, MD  rosuvastatin (CRESTOR) 10 MG tablet Take 1 tablet (10 mg total) by mouth daily. 12/31/22   Carlus Pavlov, MD  Sherryl Barters PRO TEST test strip TEST BLOOD SUGAR 5 TIMES DAILY AS INSTRUCTED 10/10/18   Carlus Pavlov, MD      Allergies    Lyrica [pregabalin]    Review of Systems   Review of Systems  Constitutional:  Negative for chills and fever.  HENT:  Negative for ear pain and sore throat.   Eyes:  Negative for pain and visual disturbance.  Respiratory:  Negative for cough and shortness of breath.   Cardiovascular:  Negative for chest pain and palpitations.  Gastrointestinal:  Negative for abdominal pain and vomiting.  Genitourinary:  Negative for dysuria and hematuria.  Musculoskeletal:  Negative for arthralgias and back pain.  Skin:  Positive for wound. Negative for color change and rash.  Neurological:  Negative for seizures and syncope.  All other systems reviewed and are negative.   Physical Exam Updated Vital Signs BP 139/69   Pulse 71   Temp 97.8 F (36.6 C)   Resp 16   Ht 1.676 m (5\' 6" )   Wt 118 kg   SpO2 98%   BMI 41.99 kg/m  Physical Exam Vitals and nursing note reviewed.   Constitutional:      General: She is not in acute distress.    Appearance: Normal appearance. She is well-developed.  HENT:     Head: Normocephalic and atraumatic.  Eyes:     Conjunctiva/sclera: Conjunctivae normal.  Cardiovascular:     Rate and Rhythm: Normal rate and regular rhythm.     Heart sounds: No murmur heard. Pulmonary:     Effort: Pulmonary effort is normal. No respiratory distress.     Breath sounds: Normal breath sounds.  Abdominal:     Palpations: Abdomen is soft.     Tenderness: There is no abdominal tenderness.  Musculoskeletal:        General: No swelling.     Cervical back:  Neck supple.     Comments: Left foot arteries had great toe amputation.  The second toe with evidence of infection.  Skin:    General: Skin is warm and dry.     Capillary Refill: Capillary refill takes less than 2 seconds.  Neurological:     General: No focal deficit present.     Mental Status: She is alert and oriented to person, place, and time.  Psychiatric:        Mood and Affect: Mood normal.     ED Results / Procedures / Treatments   Labs (all labs ordered are listed, but only abnormal results are displayed) Labs Reviewed  CBC - Abnormal; Notable for the following components:      Result Value   WBC 12.8 (*)    All other components within normal limits  COMPREHENSIVE METABOLIC PANEL - Abnormal; Notable for the following components:   CO2 20 (*)    Glucose, Bld 174 (*)    All other components within normal limits    EKG None  Radiology MR FOOT LEFT W WO CONTRAST Result Date: 09/26/2023 CLINICAL DATA:  Left second toe and left first webspace cellulitis. Evaluate for osteomyelitis. EXAM: MRI OF THE LEFT FOREFOOT WITHOUT AND WITH CONTRAST TECHNIQUE: Multiplanar, multisequence MR imaging of the left forefoot was performed both before and after administration of intravenous contrast. CONTRAST:  10mL GADAVIST GADOBUTROL 1 MMOL/ML IV SOLN COMPARISON:  Left foot radiographs  09/25/2023, 08/27/2023, 07/31/2023; MRI left forefoot 07/30/2023 MRI left forefoot 07/30/2023. FINDINGS: Bones/Joint/Cartilage Interval amputation of the great toe proximal distal phalanges compared to 07/30/2023 MRI. There is nonspecific mild marrow edema within the medial aspect of the great toe metatarsal head with minimal decreased T1 signal in this region. Given there appears to be a possible thin sinus tract/soft tissue wound within the soft tissues bordering the dorsomedial aspect of the distal first metatarsal (axial series 5 image 21 and coronal series 7, images 9 through 13), findings are suspicious for early osteomyelitis. This marrow edema is new from 07/30/2023 MRI. There is a diffuse marrow edema throughout the in the distal phalanges of the second toe (sagittal series 8 images 21 through 24). There is a mildly displaced oblique fracture of the proximal shaft of the proximal phalanx of the second toe (sagittal series 8 image 19 through 21). There is moderate second toe soft tissue swelling with possible mild ulceration at the distal toe soft tissues. Recommend clinical correlation. There is a 6 mm decreased T1 increased T2 signal possible ganglion extending dorsally and medially from the first tarsometatarsal joint (sagittal series 8, image 10), similar to 07/30/2023. Ligaments The Lisfranc ligament complex is intact. The metatarsophalangeal collateral ligaments appear intact. Muscles and Tendons No tendon tear is visualized. Moderate nonspecific intrinsic foot myositis. Soft tissues Moderate subcutaneous fat edema and swelling of the dorsomedial midfoot and forefoot, plantar mid to medial midfoot to forefoot, and dorsal aspect of the third through fifth toes. Possible wound medial to the great toe metatarsal head, as described above. IMPRESSION: Compared to 07/30/2023: 1. Interval amputation of the great toe proximal distal phalanges compared to 07/30/2023 MRI. There is nonspecific mild marrow edema  within the medial aspect of the great toe metatarsal head with minimal decreased T1 signal in this region. Question a possible thin sinus tract/soft tissue wound within bordering the dorsomedial aspect of the distal first metatarsal, findings are suspicious for early osteomyelitis. Recommend clinical correlation. 2. There is moderate second toe soft tissue swelling with possible mild  ulceration at the distal toe soft tissues. Diffuse marrow edema throughout the distal phalanges of the second toe. Findings are suspicious for osteomyelitis of the middle and distal phalanges. 3. Mildly displaced oblique fracture of the proximal shaft of the proximal phalanx of the second toe. Electronically Signed   By: Neita Garnet M.D.   On: 09/26/2023 19:36    Procedures Procedures    Medications Ordered in ED Medications - No data to display  ED Course/ Medical Decision Making/ A&P                                 Medical Decision Making Amount and/or Complexity of Data Reviewed Labs: ordered.  Risk Prescription drug management. Decision regarding hospitalization.   His vital signs here are reassuring.  Podiatry plans on amputation of the left second toe and first metatarsal head.  Tomorrow.  Patient's labs here white count 12.8 platelets are good at 293 complete metabolic panel renal functions normal.  Blood sugar 174.  CO2 down a little bit at 20.  Podiatry said does not need any additional x-rays.  Will discuss with podiatry to see if they are doing the admission or if they want medicine to do the admission.  Discussed with on-call podiatry.  They want hospitalist to do the admission.  With her white count being up he wants to go ahead and give her a dose of vancomycin.  That has been ordered.  Consulted the hospitalist for admission ordered.   Final Clinical Impression(s) / ED Diagnoses Final diagnoses:  Left foot infection    Rx / DC Orders ED Discharge Orders     None          Vanetta Mulders, MD 09/28/23 1737    Vanetta Mulders, MD 09/28/23 1744

## 2023-09-28 NOTE — Assessment & Plan Note (Signed)
-   Order Sensitive   SSI   - continue home insulin but decreased to NPH 15 units, at bedtime given episodes of hypoglycemia  -  check TSH and HgA1C  - Hold by mouth medications

## 2023-09-28 NOTE — Progress Notes (Signed)
Subjective: Chief Complaint  Patient presents with   Foot Pain    RM#12 Left foot follow up.   67 year old female presents the office today for follow evaluation of left foot infection.  She was recently hospitalized at [facility] however left AMA yesterday as she was overwhelmed with having infection in her foot and needing surgery.  She also states that she did not have suicidal ideations however she was accused of this which made her upset.  I had contacted her yesterday and she called me back this morning and she presents duration.  She was fearful to come to the office as she was concerned that we are going to "lock her up".  She does not report any fevers or chills.  Objective: AAO x3, NAD DP/PT pulses palpable bilaterally, CRT less than 3 seconds Incision from prior surgery is well-healed.  There is still edema and erythema present of the second toe and the wound present as well as the toe.  Symptoms unchanged. No pain with calf compression, swelling, warmth, erythema  Assessment: Cellulitis, osteomyelitis second toe  Plan: -All treatment options discussed with the patient including all alternatives, risks, complications.  -I had again a long discussion with the patient regards to her options.  I called her this morning around the phone for some time and also during today's appointment discussed options.  Ultimately she agrees to proceed with amputation of the second toe.  I had Dr. Annamary Rummage who is on-call for the hospital also see her today and will plan on surgery tomorrow.  She keeps questioning why this infection is present and how this occurred.  Discussed that we will send the bone to pathology as well as take cultures.  We discussed being diabetic with neuropathy and a digital contracture of the pressure likely is what causes after her big toe was amputated.  Discussed long-term need to help decrease pressure on the other toes.  She wanted me to guarantee outcomes and  discussed with her that I will never guarantee outcomes and cannot guarantee that she will never have an issue with this foot again, but we need to monitor closely in the future to help event further issues. -Patient encouraged to call the office with any questions, concerns, change in symptoms.   Vivi Barrack DPM

## 2023-09-28 NOTE — Assessment & Plan Note (Signed)
Continue home meds including coreg 3.125 mg po BID

## 2023-09-28 NOTE — ED Notes (Addendum)
09/28/23 1220 Received call from [facility] with + results for Strep with blood. Person stated pt was discharged from ED which is incorrect, pt discharged from floor. Will send message in Amion to Dr Isidoro Donning who saw pt to let her know results.  Will also make EDP aware. Have spoken to Dr Renaye Rakers who recommends pt return for treatment (She did leave AMA from the floor) Have tried to contact [place], unable to leave voice mail due to mailbox full. Spoke with her emergency contact son Yoon Knee [phone] who has been advised of the need for his mother to return to a medical facility for evaluation of blood results. He verbalizes understanding.

## 2023-09-28 NOTE — ED Triage Notes (Signed)
Ongoing left foot 2nd toe infection. Great toe amputated on Oct. Admitted at [facility] for same complaints but pt left AMA on 12/14. Sent here today from podiatrist's office.

## 2023-09-28 NOTE — Subjective & Objective (Signed)
  Has ben bothering her for the past few months Home nurses have seen her on Tuesday and felt it ws getting worse She went to [facility] but got distraught bc she needed amputation she left AMA Went to Dr. Arsenio Katz today and he rec pt going directly to ER for IV vancomycin and NPO post midnight for possible amputation Had left great toe amputation in October 2024  Denies any fever has been checking temperature in her foot and it was 1 degree higher  No recent nausea or vomiting

## 2023-09-29 ENCOUNTER — Inpatient Hospital Stay (HOSPITAL_COMMUNITY): Payer: Medicare Other

## 2023-09-29 ENCOUNTER — Encounter (HOSPITAL_COMMUNITY)
Admission: EM | Disposition: A | Discharge status: Home / Self Care | Payer: Self-pay | Source: Home / Self Care | Attending: Internal Medicine

## 2023-09-29 ENCOUNTER — Inpatient Hospital Stay (HOSPITAL_COMMUNITY): Payer: Medicare Other | Admitting: Anesthesiology

## 2023-09-29 ENCOUNTER — Encounter (HOSPITAL_COMMUNITY): Payer: Self-pay | Admitting: Internal Medicine

## 2023-09-29 ENCOUNTER — Other Ambulatory Visit: Payer: Self-pay

## 2023-09-29 ENCOUNTER — Other Ambulatory Visit (HOSPITAL_COMMUNITY): Payer: Medicare Other

## 2023-09-29 DIAGNOSIS — E1169 Type 2 diabetes mellitus with other specified complication: Secondary | ICD-10-CM | POA: Diagnosis not present

## 2023-09-29 DIAGNOSIS — M869 Osteomyelitis, unspecified: Secondary | ICD-10-CM | POA: Diagnosis not present

## 2023-09-29 DIAGNOSIS — M86172 Other acute osteomyelitis, left ankle and foot: Secondary | ICD-10-CM | POA: Diagnosis not present

## 2023-09-29 HISTORY — PX: AMPUTATION TOE: SHX6595

## 2023-09-29 LAB — COMPREHENSIVE METABOLIC PANEL
ALT: 29 U/L (ref 0–44)
AST: 32 U/L (ref 15–41)
Albumin: 3.4 g/dL — ABNORMAL LOW (ref 3.5–5.0)
Alkaline Phosphatase: 66 U/L (ref 38–126)
Anion gap: 9 (ref 5–15)
BUN: 9 mg/dL (ref 8–23)
CO2: 21 mmol/L — ABNORMAL LOW (ref 22–32)
Calcium: 8.9 mg/dL (ref 8.9–10.3)
Chloride: 109 mmol/L (ref 98–111)
Creatinine, Ser: 0.6 mg/dL (ref 0.44–1.00)
GFR, Estimated: >60 mL/min (ref >60)
Glucose, Bld: 173 mg/dL — ABNORMAL HIGH (ref 70–99)
Potassium: 3.7 mmol/L (ref 3.5–5.1)
Sodium: 139 mmol/L (ref 135–145)
Total Bilirubin: 0.7 mg/dL (ref <1.2)
Total Protein: 6.8 g/dL (ref 6.5–8.1)

## 2023-09-29 LAB — GLUCOSE, CAPILLARY
Glucose-Capillary: 124 mg/dL — ABNORMAL HIGH (ref 70–99)
Glucose-Capillary: 127 mg/dL — ABNORMAL HIGH (ref 70–99)
Glucose-Capillary: 137 mg/dL — ABNORMAL HIGH (ref 70–99)
Glucose-Capillary: 140 mg/dL — ABNORMAL HIGH (ref 70–99)
Glucose-Capillary: 174 mg/dL — ABNORMAL HIGH (ref 70–99)
Glucose-Capillary: 181 mg/dL — ABNORMAL HIGH (ref 70–99)
Glucose-Capillary: 192 mg/dL — ABNORMAL HIGH (ref 70–99)

## 2023-09-29 LAB — CBC
HCT: 36.5 % (ref 36.0–46.0)
Hemoglobin: 12 g/dL (ref 12.0–15.0)
MCH: 29.1 pg (ref 26.0–34.0)
MCHC: 32.9 g/dL (ref 30.0–36.0)
MCV: 88.6 fL (ref 80.0–100.0)
Platelets: 276 10*3/uL (ref 150–400)
RBC: 4.12 MIL/uL (ref 3.87–5.11)
RDW: 13.4 % (ref 11.5–15.5)
WBC: 11.1 10*3/uL — ABNORMAL HIGH (ref 4.0–10.5)
nRBC: 0 % (ref 0.0–0.2)

## 2023-09-29 LAB — PHOSPHORUS: Phosphorus: 2.9 mg/dL (ref 2.5–4.6)

## 2023-09-29 LAB — CULTURE, BLOOD (ROUTINE X 2)

## 2023-09-29 LAB — FOLATE: Folate: 7.2 ng/mL (ref >5.9)

## 2023-09-29 LAB — MAGNESIUM: Magnesium: 1.8 mg/dL (ref 1.7–2.4)

## 2023-09-29 LAB — PREALBUMIN: Prealbumin: 19 mg/dL (ref 18–38)

## 2023-09-29 LAB — VITAMIN B12: Vitamin B-12: 620 pg/mL (ref 180–914)

## 2023-09-29 SURGERY — AMPUTATION, TOE
Anesthesia: Monitor Anesthesia Care | Site: Toe | Laterality: Left

## 2023-09-29 MED ORDER — LACTATED RINGERS IV SOLN: INTRAVENOUS | Status: DC | PRN

## 2023-09-29 MED ORDER — DIPHENHYDRAMINE HCL 25 MG PO CAPS
25.0000 mg | ORAL_CAPSULE | Freq: Four times a day (QID) | ORAL | Status: DC | PRN
  Administered 2023-09-29: 25 mg via ORAL
  Filled 2023-09-29: qty 1

## 2023-09-29 MED ORDER — ORAL CARE MOUTH RINSE: 15.0000 mL | Freq: Once | OROMUCOSAL | Status: AC

## 2023-09-29 MED ORDER — SODIUM CHLORIDE 0.9 % IV SOLN
INTRAVENOUS | Status: AC
Start: 2023-09-29 — End: 2023-09-29

## 2023-09-29 MED ORDER — FENTANYL CITRATE (PF) 250 MCG/5ML IJ SOLN
INTRAMUSCULAR | Status: DC | PRN
  Administered 2023-09-29: 25 ug via INTRAVENOUS

## 2023-09-29 MED ORDER — PROPOFOL 10 MG/ML IV BOLUS
INTRAVENOUS | Status: AC
  Filled 2023-09-29: qty 20

## 2023-09-29 MED ORDER — BUPIVACAINE HCL (PF) 0.5 % IJ SOLN
INTRAMUSCULAR | Status: DC | PRN
  Administered 2023-09-29: 10 mL

## 2023-09-29 MED ORDER — CHLORHEXIDINE GLUCONATE 0.12 % MT SOLN: 15.0000 mL | Freq: Once | OROMUCOSAL | Status: AC

## 2023-09-29 MED ORDER — CEFAZOLIN SODIUM-DEXTROSE 2-3 GM-%(50ML) IV SOLR
INTRAVENOUS | Status: DC | PRN
  Administered 2023-09-29: 2 g via INTRAVENOUS

## 2023-09-29 MED ORDER — SODIUM CHLORIDE (PF) 0.9 % IJ SOLN
INTRAMUSCULAR | Status: AC
  Filled 2023-09-29: qty 10

## 2023-09-29 MED ORDER — KETAMINE HCL 50 MG/5ML IJ SOSY
PREFILLED_SYRINGE | INTRAMUSCULAR | Status: DC | PRN
  Administered 2023-09-29: 15 mg via INTRAVENOUS

## 2023-09-29 MED ORDER — CHLORHEXIDINE GLUCONATE 0.12 % MT SOLN
OROMUCOSAL | Status: AC
  Administered 2023-09-29: 15 mL via OROMUCOSAL
  Filled 2023-09-29: qty 15

## 2023-09-29 MED ORDER — OSTEO BI-FLEX ADV TRIPLE ST PO TABS
1.0000 | ORAL_TABLET | Freq: Two times a day (BID) | ORAL | Status: DC
  Administered 2023-09-30 – 2023-10-01 (×3): 1 via ORAL
  Filled 2023-09-29 (×5): qty 1

## 2023-09-29 MED ORDER — KETAMINE HCL 50 MG/5ML IJ SOSY
PREFILLED_SYRINGE | INTRAMUSCULAR | Status: AC
  Filled 2023-09-29: qty 5

## 2023-09-29 MED ORDER — DICLOFENAC SODIUM 1 % EX GEL
4.0000 g | Freq: Four times a day (QID) | CUTANEOUS | Status: DC
  Administered 2023-09-30 – 2023-10-01 (×6): 4 g via TOPICAL
  Filled 2023-09-29: qty 100

## 2023-09-29 MED ORDER — INSULIN ASPART 100 UNIT/ML IJ SOLN
INTRAMUSCULAR | Status: AC
  Administered 2023-09-29: 2 [IU] via SUBCUTANEOUS
  Filled 2023-09-29: qty 1

## 2023-09-29 MED ORDER — LIDOCAINE HCL 2 % IJ SOLN
INTRAMUSCULAR | Status: AC
  Filled 2023-09-29: qty 20

## 2023-09-29 MED ORDER — MIDAZOLAM HCL 5 MG/5ML IJ SOLN
INTRAMUSCULAR | Status: DC | PRN
  Administered 2023-09-29: 2 mg via INTRAVENOUS

## 2023-09-29 MED ORDER — FENTANYL CITRATE (PF) 250 MCG/5ML IJ SOLN
INTRAMUSCULAR | Status: AC
  Filled 2023-09-29: qty 5

## 2023-09-29 MED ORDER — BUPIVACAINE HCL (PF) 0.5 % IJ SOLN
INTRAMUSCULAR | Status: AC
  Filled 2023-09-29: qty 30

## 2023-09-29 MED ORDER — LIDOCAINE 2% (20 MG/ML) 5 ML SYRINGE
INTRAMUSCULAR | Status: AC
  Filled 2023-09-29: qty 5

## 2023-09-29 MED ORDER — INSULIN ASPART 100 UNIT/ML IJ SOLN
0.0000 [IU] | INTRAMUSCULAR | Status: DC | PRN
Start: 2023-09-29 — End: 2023-09-29

## 2023-09-29 MED ORDER — CEFAZOLIN SODIUM 1 G IJ SOLR
INTRAMUSCULAR | Status: AC
  Filled 2023-09-29: qty 20

## 2023-09-29 MED ORDER — VANCOMYCIN HCL 1.5 G IV SOLR: 1500.0000 mg | INTRAVENOUS | Status: DC

## 2023-09-29 MED ORDER — DIPHENHYDRAMINE-ZINC ACETATE 2-0.1 % EX CREA
TOPICAL_CREAM | Freq: Three times a day (TID) | CUTANEOUS | Status: DC | PRN
  Filled 2023-09-29: qty 28

## 2023-09-29 MED ORDER — MIDAZOLAM HCL (PF) 10 MG/2ML IJ SOLN
INTRAMUSCULAR | Status: AC
Start: 2023-09-29 — End: ?
  Filled 2023-09-29: qty 2

## 2023-09-29 MED ORDER — VANCOMYCIN HCL 1.5 G IV SOLR
1500.0000 mg | INTRAVENOUS | Status: DC
  Administered 2023-09-30 – 2023-10-01 (×2): 1500 mg via INTRAVENOUS
  Filled 2023-09-29 (×2): qty 30

## 2023-09-29 SURGICAL SUPPLY — 35 items
BLADE OSC/SAGITTAL MD 5.5X18 (BLADE) ×2 IMPLANT
BLADE SURG 10 STRL SS (BLADE) ×2 IMPLANT
BLADE SURG 15 STRL LF DISP TIS (BLADE) ×4 IMPLANT
BNDG COHESIVE 3X5 TAN ST LF (GAUZE/BANDAGES/DRESSINGS) ×2 IMPLANT
BNDG ELASTIC 2 VLCR STRL LF (GAUZE/BANDAGES/DRESSINGS) IMPLANT
BNDG ELASTIC 3INX 5YD STR LF (GAUZE/BANDAGES/DRESSINGS) ×2 IMPLANT
BNDG ESMARK 4X9 LF (GAUZE/BANDAGES/DRESSINGS) ×2 IMPLANT
CHLORAPREP W/TINT 26 (MISCELLANEOUS) ×2 IMPLANT
DRSG TUBE GAUZE 1X5YD SZ2 (GAUZE/BANDAGES/DRESSINGS) IMPLANT
DRSG XEROFORM 1X8 (GAUZE/BANDAGES/DRESSINGS) IMPLANT
ELECT REM PT RETURN 9FT ADLT (ELECTROSURGICAL) ×1 IMPLANT
ELECTRODE REM PT RTRN 9FT ADLT (ELECTROSURGICAL) ×2 IMPLANT
GAUZE SPONGE 2X2 STRL 8-PLY (GAUZE/BANDAGES/DRESSINGS) ×4 IMPLANT
GAUZE SPONGE 4X4 12PLY STRL (GAUZE/BANDAGES/DRESSINGS) ×2 IMPLANT
GAUZE SPONGE 4X4 12PLY STRL LF (GAUZE/BANDAGES/DRESSINGS) IMPLANT
GAUZE STRETCH 2X75IN STRL (MISCELLANEOUS) ×2 IMPLANT
GAUZE XEROFORM 1X8 LF (GAUZE/BANDAGES/DRESSINGS) ×2 IMPLANT
GLOVE BIO SURGEON STRL SZ7.5 (GLOVE) ×2 IMPLANT
GLOVE BIOGEL PI IND STRL 7.5 (GLOVE) ×2 IMPLANT
GOWN STRL REUS W/ TWL LRG LVL3 (GOWN DISPOSABLE) ×4 IMPLANT
KIT BASIN OR (CUSTOM PROCEDURE TRAY) ×2 IMPLANT
NDL HYPO 25X1 1.5 SAFETY (NEEDLE) ×4 IMPLANT
NEEDLE HYPO 25X1 1.5 SAFETY (NEEDLE) ×2 IMPLANT
PACK ORTHO EXTREMITY (CUSTOM PROCEDURE TRAY) ×2 IMPLANT
PADDING CAST ABS COTTON 4X4 ST (CAST SUPPLIES) ×4 IMPLANT
SET HNDPC FAN SPRY TIP SCT (DISPOSABLE) IMPLANT
SPIKE FLUID TRANSFER (MISCELLANEOUS) ×4 IMPLANT
STOCKINETTE 4X48 STRL (DRAPES) ×2 IMPLANT
SUT ETHILON 3 0 FSLX (SUTURE) ×2 IMPLANT
SUT PROLENE 4 0 PS 2 18 (SUTURE) ×4 IMPLANT
SYR CONTROL 10ML LL (SYRINGE) ×4 IMPLANT
TUBE CONNECTING 20X1/4 (TUBING) IMPLANT
UNDERPAD 30X36 HEAVY ABSORB (UNDERPADS AND DIAPERS) ×2 IMPLANT
WATER STERILE IRR 1000ML POUR (IV SOLUTION) ×2 IMPLANT
YANKAUER SUCT BULB TIP NO VENT (SUCTIONS) IMPLANT

## 2023-09-29 NOTE — Anesthesia Preprocedure Evaluation (Addendum)
Anesthesia Evaluation  Patient identified by MRN, date of birth, ID band Patient awake    Reviewed: Allergy & Precautions, NPO status , Patient's Chart, lab work & pertinent test results  History of Anesthesia Complications Negative for: history of anesthetic complications  Airway Mallampati: III  TM Distance: >3 FB   Mouth opening: Limited Mouth Opening  Dental  (+) Poor Dentition   Pulmonary sleep apnea , neg COPD, neg PE   breath sounds clear to auscultation       Cardiovascular Exercise Tolerance: Poor hypertension, + CAD, + Cardiac Stents and + Peripheral Vascular Disease  (-) Past MI and (-) CHF (-) dysrhythmias (-) pacemaker Rhythm:Regular Rate:Normal     Neuro/Psych neg Seizures  Neuromuscular disease    GI/Hepatic ,,,(+) neg Cirrhosis        Endo/Other  diabetes, Poorly Controlled, Type 2  Class 4 obesity  Renal/GU Renal disease Bladder dysfunction      Musculoskeletal  (+) Arthritis ,  Fibromyalgia -  Abdominal   Peds  Hematology   Anesthesia Other Findings   Reproductive/Obstetrics                             Anesthesia Physical Anesthesia Plan  ASA: 3  Anesthesia Plan: General   Post-op Pain Management:    Induction:   PONV Risk Score and Plan: 1 and Ondansetron  Airway Management Planned: LMA  Additional Equipment:   Intra-op Plan:   Post-operative Plan: Extubation in OR  Informed Consent: I have reviewed the patients History and Physical, chart, labs and discussed the procedure including the risks, benefits and alternatives for the proposed anesthesia with the patient or authorized representative who has indicated his/her understanding and acceptance.     Dental advisory given  Plan Discussed with: CRNA  Anesthesia Plan Comments:        Anesthesia Quick Evaluation

## 2023-09-29 NOTE — Consult Note (Signed)
WOC Nurse Consult Note: Reason for Consult: Consult requested for LLE wound.  According to podiatry service progress notes, "Plan for Left 2nd toe amp and 1st met head resection 09/29/23 at 1500.  Please refer to podiatry service for further plan of care.  Please re-consult if further assistance is needed.  Thank-you,  Cammie Mcgee MSN, RN, CWOCN, [place], CNS [phone]

## 2023-09-29 NOTE — TOC Initial Note (Addendum)
Transition of Care [facility]) - Initial/Assessment Note    Patient Details  Name: Betty [name] MRN: [id number] Date of Birth: Jun 27, 1956  [facility] CM/SW Contact:    Harriet Masson, RN Phone Number: 09/29/2023, 2:34 PM  Clinical Narrative:                  Spoke to son and patient at bedside regarding transition needs.  Patient lives alone and has used [facility] in the past for home health. Patient is agreeable to use them again if recommended.  Patient has a walker, rollator, BSC and cane at home. Patient uses [employer] grocery delivery due to not being able to drive due to vision.  Son lives in [place].  Patient has PCP that is not in [employer] and the taxi ride both ways is 80$. Due to the PCP being out of [employer] patient is unable to use senior resources transportation.  Valentina Gu, CSW from [facility] gave son the [employer]. This RNCM explained website and encouraged son to research the site for resources.  Patient scheduled for surgery today.  Address, Phone number and PCP verified. TOC will continue to follow for needs.   1457 Dr. [place] notified this RNCM that patient may need home iv antibiotics. This RNCM notified Pam with [employer].   Expected Discharge Plan: Home w Home Health Services Barriers to Discharge: Continued Medical Work up   Patient Goals and CMS Choice Patient states their goals for this hospitalization and ongoing recovery are:: retturn home CMS Medicare.gov Compare Post Acute Care list provided to:: Patient Choice offered to / list presented to : Patient      Expected Discharge Plan and Services     Post Acute Care Choice: Home Health Living arrangements for the past 2 months: Single Family Home                                      Prior Living Arrangements/Services Living arrangements for the past 2 months: Single Family Home Lives with:: Self Patient language and need for  interpreter reviewed:: Yes Do you feel safe going back to the place where you live?: Yes      Need for Family Participation in Patient Care: Yes (Comment) Care giver support system in place?: No (comment) Current home services: DME (walker, cane rollator) Criminal Activity/Legal Involvement Pertinent to Current Situation/Hospitalization: No - Comment as needed  Activities of Daily Living   ADL Screening (condition at time of admission) Independently performs ADLs?: Yes (appropriate for developmental age) Is the patient deaf or have difficulty hearing?: No Does the patient have difficulty seeing, even when wearing glasses/contacts?: Yes Does the patient have difficulty concentrating, remembering, or making decisions?: No  Permission Sought/Granted                  Emotional Assessment Appearance:: Appears stated age Attitude/Demeanor/Rapport: Engaged Affect (typically observed): Accepting Orientation: : Oriented to Self, Oriented to Place, Oriented to  Time, Oriented to Situation Alcohol / Substance Use: Not Applicable Psych Involvement: No (comment)  Admission diagnosis:  Osteomyelitis (HCC) [M86.9] Left foot infection [L08.9] Patient Active Problem List   Diagnosis Date Noted   Osteomyelitis (HCC) 09/28/2023   Bacteremia 09/28/2023   Cellulitis of toe of left foot 09/26/2023   Osteomyelitis of second toe of left foot (HCC) 09/25/2023   Osteomyelitis of great toe of left foot (HCC) 08/01/2023  Acute osteomyelitis of toe, left (HCC) 07/30/2023   Diabetic foot ulcer_left great toe 07/29/2023   Morbid obesity with BMI of 40.0-44.9, adult (HCC) 07/29/2023   Abnormal nuclear cardiac imaging test 09/06/2019   Cerebellar ataxia in diseases classified elsewhere (HCC) 08/06/2018   Gait abnormality 08/02/2018   Diabetic peripheral neuropathy (HCC) 06/17/2017   Poorly controlled type 2 diabetes mellitus with circulatory disorder (HCC) 03/06/2016   Osteoarthritis    Obesity     Sleep apnea    Hyperlipidemia 07/31/2009   HYPERTENSION, BENIGN 07/31/2009   CAD, NATIVE VESSEL 07/31/2009   PCP:  Ailene Ravel, MD Pharmacy:   [facility] - [place], [place] - [address] [facility] [address] Portal to Registered [address] [place] [address] Phone: [phone] Fax: [phone]  [facility] - [place], [place] - [address] [place] [address] Phone: [phone] Fax: [phone]     Social Drivers of Health (SDOH) Social History: SDOH Screenings   Food Insecurity: No Food Insecurity (09/28/2023)  Housing: High Risk (09/28/2023)  Transportation Needs: Unmet Transportation Needs (09/28/2023)  Utilities: Not At Risk (09/28/2023)  Tobacco Use: Low Risk  (09/29/2023)   SDOH Interventions:     Readmission Risk Interventions     No data to display

## 2023-09-29 NOTE — Progress Notes (Signed)
Initial Nutrition Assessment  DOCUMENTATION CODES:   Not applicable  INTERVENTION:  Patient current NPO, however upon reintroduction of nutrition s/p surgery, the following interventions are recommended:  -1 packet Juven BID - Add MVI   NUTRITION DIAGNOSIS:   Increased nutrient needs related to wound healing as evidenced by estimated needs.   GOAL:  Patient will meet greater than or equal to 90% of their needs   MONITOR:  PO intake  REASON FOR ASSESSMENT:  Consult Wound healing, Assessment of nutrition requirement/status  ASSESSMENT:   Patient presented to hospital with need for ABX 2/2 MRI concerning for osteomyelitis of distal and middle phalanges of L second toe as well as possible osteomyelitis of the first metatarsal head. Left great toe amputated in October of this year r/t osteomyelitis. Was started on ABX on 11/15, and endorsed she did not complete the course d/t GI distress.  PMH: T2DM insulin-dpendent, morbid obesity, CVA, HLD, HTN, OA, CAD, peripheral neuropathy, osteomyelitis of L big toe s/p amputation.  Pt is currently NPO due to impending L second toe amputation. Son at bedside and requesting two dinner meals s/p surgery since she has not eaten all day. Pt reports she has fairly inconsistent meal intake at home as she lives alone.   Reports when CBGs are low, she will eat to bring them up. Son at bedside, reports he lives in [place] and has groceries delivered, but no one is ensuring she physically consumes them. Mainly relies on convenience items, like frozen meals.   No N/V/D or difficulties chewing or swallowing reported. Remains largely sedentary at home.   Pt endorses she is on ozempic and has had weight loss secondary to this, however not significant.   12/18 - Amputation of second L toe 12/17 - Presented to [facility] ED from podiatry 12/14 - Presented to [facility] from podiatrist for swelling to L  second toe s/p L great toe amputation  back in October. Left AMA at recommendation of surgery.  Labs: CBGs 83-215 mg/dL Hgb Z6X 0.9% WBC 60.4 <- 12.8  Meds: insulin SSI, ceftriaxone, vancomycin    NUTRITION - FOCUSED PHYSICAL EXAM:  Flowsheet Row Most Recent Value  Orbital Region No depletion  Upper Arm Region No depletion  Thoracic and Lumbar Region No depletion  Buccal Region No depletion  Temple Region No depletion  Clavicle Bone Region No depletion  Clavicle and Acromion Bone Region No depletion  Scapular Bone Region No depletion  Dorsal Hand No depletion  Patellar Region Mild depletion  Anterior Thigh Region Mild depletion  Posterior Calf Region Unable to assess  [Edema present]  Edema (RD Assessment) Moderate  Hair Reviewed  [Hair thinning and easily pluckable]  Eyes Reviewed  Mouth Reviewed  Skin Reviewed  Nails Reviewed        Diet Order:   Diet Order             Diet NPO time specified Except for: Sips with Meds  Diet effective now                   EDUCATION NEEDS:   Not appropriate for education at this time  Skin:  Skin Assessment: Reviewed RN Assessment  Last BM:  12/17  Height:   Ht Readings from Last 1 Encounters:  09/29/23 5\' 6"  (1.676 m)    Weight:  Wt Readings from Last 1 Encounters:  09/29/23 113.4 kg    Ideal Body Weight:  59 kg  BMI:  Body mass index is  40.35 kg/m.  Estimated Nutritional Needs:   Kcal:  1900-2100 kcal  Protein:  95-105g  Fluid:  >2L   Myrtie Cruise MS, RD, LDN Registered Dietitian Clinical Nutrition RD Inpatient Contact Info in Amion

## 2023-09-29 NOTE — Progress Notes (Signed)
X-ray at bedside

## 2023-09-29 NOTE — Progress Notes (Signed)
Pharmacy Antibiotic Note  Betty [name] is a 67 y.o. female admitted on 09/28/2023 with  osteomyelitis .  Pharmacy has been consulted for Vancomycin dosing.  Pt received vancomycin loading dose of 2500mg  (given as 1000mg  + 1500mg ) in ED and another 1500 mg dose this morning.  Plan:  Adjust Vancomycin 1500mg  IV to q24h (SCr used 0.8, Vd 0.5, eAUC ~464)    > Goal AUC 400-550    > Check vancomycin levels at steady state  Ceftriaxone 2g IV q24h per MD  Monitor daily CBC, temp, SCr, and for clinical signs of improvement  F/u cultures and de-escalate antibiotics as able   Height: 5\' 6"  (167.6 cm) Weight: 113.9 kg (251 lb 1.7 oz) IBW/kg (Calculated) : 59.3  Temp (24hrs), Avg:97.8 F (36.6 C), Min:97.4 F (36.3 C), Max:98 F (36.7 C)  Recent Labs  Lab 09/25/23 2050 09/26/23 0047 09/26/23 0342 09/27/23 0340 09/28/23 1614 09/29/23 0448  WBC 13.6* 15.2* 13.0* 10.8* 12.8* 11.1*  CREATININE 0.44 0.58 0.65 0.72 0.59 0.60  LATICACIDVEN 1.3 0.7  --   --   --   --     Estimated Creatinine Clearance: 86.2 mL/min (by C-G formula based on SCr of 0.6 mg/dL).    Allergies  Allergen Reactions   Lyrica [Pregabalin] Other (See Comments)    sleepy   Tape Rash    Antimicrobials this admission: Vancomycin 12/17 >>  Ceftriaxone 12/17 >>   Dose adjustments this admission: N/A  Microbiology results: 12/17 Bld cx x2: ng <12h  Prior to admission, blood culture from 12/14 positive for 1/2 blood cultures growing Streptococcus parasanguinis    Thank you for allowing pharmacy to be a part of this patient's care.   Signe Colt, PharmD 09/29/2023 1:39 PM  **Pharmacist phone directory can be found on [email or web address] listed under [facility]**

## 2023-09-29 NOTE — Plan of Care (Signed)
  Problem: Education: Goal: Ability to describe self-care measures that may prevent or decrease complications (Diabetes Survival Skills Education) will improve Outcome: Progressing Pt has a history of diabetes.  While hospitalized her CBG will be checked q4 hours per MD's orders.  She is on slinding scale fo insulin coverage.    Problem: Education: Goal: Knowledge of General Education information will improve Description: Including pain rating scale, medication(s)/side effects and non-pharmacologic comfort measures Outcome: Progressing Pt understands that she was admitted into the hospital for left foot 2nd toe infection.  She has an admitting diagnosis of osteomyelitis of that foot.  She is pending an amputation of 2nd toe to left foot.  Pt is on IV abx per MD's orders.    Problem: Clinical Measurements: Goal: Will remain free from infection Outcome: Progressing S/Sx of infection monitored and assessed q-shift.  Pt has remained afebrile thus far.  She is on IV abx per MD's orders.       Problem: Clinical Measurements: Goal: Respiratory complications will improve Outcome: Progressing Respiratory status monitored and assessed q-shift.  Pt is on room air with PO2 at 92-99% and respiration rate of 13-18 breaths per minute.  Pt has not endorsed c/o SOB or DOE.    Problem: Clinical Measurements: Goal: Cardiovascular complication will be avoided Outcome: Progressing Pt's WNL thus far.    Problem: Activity: Goal: Risk for activity intolerance will decrease Outcome: Progressing Pt is independent of all her ADLs.  She can get up OOB independently with a cane.      Problem: Nutrition: Goal: Adequate nutrition will be maintained Outcome: Progressing Pt is NPO except for sips/ meds per MD's orders.  She is pending an amputation of 2nd toe to left foot      Problem: Safety: Goal: Ability to remain free from injury will improve Outcome: Progressing Pt has remained free from falls thus far.   Instructed pt to utilize RN call light for assistance.  Hourly rounds performed.  Bed in lowest position, locked with two upper side rails engaged.  Belongings and call light within reach.    Problem: Skin Integrity: Goal: Risk for impaired skin integrity will decrease Outcome: Progressing Skin integrity monitored and assessed q-shift.  Instructed pt to turn q2 hours to prevent further skin impairment.  Tubes and drains assessed for device related pressures sores.  Pt is continent of both bowel and bladder.

## 2023-09-29 NOTE — Inpatient Diabetes Management (Addendum)
Inpatient Diabetes Program Recommendations  AACE/ADA: New Consensus Statement on Inpatient Glycemic Control (2015)  Target Ranges:  Prepandial:   less than 140 mg/dL      Peak postprandial:   less than 180 mg/dL (1-2 hours)      Critically ill patients:  140 - 180 mg/dL   Lab Results  Component Value Date   GLUCAP 181 (H) 09/29/2023   HGBA1C 7.2 (H) 09/28/2023    Latest Reference Range & Units 09/27/23 08:12 09/27/23 11:57 09/28/23 20:07 09/28/23 23:29 09/29/23 05:25 09/29/23 07:39 09/29/23 11:30  Glucose-Capillary 70 - 99 mg/dL 324 (H) 401 (H) 027 (H) 136 (H) 140 (H) 137 (H) 181 (H)  (H): Data is abnormally high  Review of Glycemic Control  Diabetes history: DM2 Outpatient Diabetes medications: NPH 20 units am, 15 units pm, Regular 15-20 units TID with meals, Metformin XR 500 mg BID, Ozempic 1 mg Qweek Current orders for Inpatient glycemic control: Semglee 15 units daily, Novolog 0-9 units q 4 hrs.  Inpatient Diabetes Program Recommendations:   Noted patient did not receive Semglee last pm due to lower CBGs. Please consider: -D/C Semglee for now and reevaluate.  1:00 Met with patient and son @ bedside to discuss insulin management. See above updated insulin amounts. Reviewed with patient hypoglycemia and patient states sometimes she has to call her sister in the middle of the night to come bring her something. Reviewed quick options of fruit juice and soda to get CBG up initially. Patient has been having hypoglycemia @ different times. Patient has not been sleeping well and eats @ irregular times. Patient has prescription for CGM from Dr. Elvera Lennox. Patient has appointment with endocrinologist Dr. Elvera Lennox in January and aware to take log of CBGs or sensor readings with her to her appointments.  Thank you, Billy Fischer. Yanira Tolsma, RN, MSN, CDCES  Diabetes Coordinator Inpatient Glycemic Control Team Team Pager [phone] (8am-5pm) 09/29/2023 1:28 PM'   Thank you, Billy Fischer. Cheila Wickstrom, RN,  MSN, CDCES  Diabetes Coordinator Inpatient Glycemic Control Team Team Pager [phone] (8am-5pm) 09/29/2023 11:58 AM

## 2023-09-29 NOTE — Progress Notes (Signed)
CCC Pre-op Review 1.Surgical orders: Consent orders: Consent signed:  Waiting clarification from floor RN Suzette Battiest Patient alert and oriented: Antibiotic:  Rocephin 2 G 2338 Vanc 1.5g 0646 Pre-meds:  2.  Pre-procedure checklist completed: Request completion by floor RN  3.  NPO:  MN  4.  CHG Bath completed:  Request completion by floor RN      Belongings removed and placed in clean gown:  5.  Labs Performed: CBC    Abnormal  CMP/BMP   Abnormal PT/INR  NA HA1C   7.2  on 09/28/23 Type and Screen NA Surgical PCR:  Pregnancy:  NA EKG:    09/29/23 Chest x-ray:  07/29/23   6.  Recent H&P or progress note if inpatient:   09/29/23  7.  Language Barrier:  NA  8.  Vital Signs:   Abnormal Oxygen:  RA  Tele:  Can the patient travel without Tele  9.  Medications: Cardiac Drips:  NO Pain Medications: Gabapentin 100mg  2332 Beta Blocker:   Coreg 3.125mg  0913 Anticoagulants:  NO GLP1:   NO  10. IV access:   20G right hand  11. Diabetic:   yes  CBG:  137    @    0739  Aspart 1 UNIT given @ [id number]

## 2023-09-29 NOTE — Consult Note (Signed)
PODIATRY CONSULTATION  NAME Betty [name] MRN [id number] DOB 04-Dec-1955 DOA 09/28/2023   Reason for consult:  Chief Complaint  Patient presents with   Wound Infection    Attending/Consulting physician: Eric [place] MD  History of present illness: "Betty [name] is a 67 y.o. female with past medical history significant for DM M2, HTN, HLD, OSA not on CPAP, morbid obesity who presented to [facility] ED on 12/17 by direction of podiatry outpatient with left foot infection concern for osteomyelitis.  Patient was recently admitted to [facility] but left AGAINST MEDICAL ADVICE on 09/25/2023; due to being distraught about needing an amputation.  Was seen again in the outpatient podiatry office on 12/17 with concerns of left foot cellulitis, osteomyelitis to second toe with recommendations of return to the hospital for surgical management.   Of note, blood cultures previously done at [facility] positive for Streptococcus parasanguinous in both anaerobic/aerobic bottles.   In the ED, temperature 97.8 F, HR 71, RR 16, BP 139/69, SpO2 98% room air.  WBC 12.8, hemoglobin 12.7, platelet count 293.  Sodium 139, potassium 4.2, chloride 107, CO2 20, glucose 174, BUN 11, creatinine 0.59.  AST 21, ALT 24, total bilirubin 0.7.  CK1 09.  Procalcitonin less than 0.10.  TSH 2.409.  Blood cultures x 2 obtained.   MR 09/26/2023 with noted interval amputation great toe proximal distal phalanges, mild marrow edema medial aspect great toe metatarsal heads with question sinus tract/soft tissue wound distal first metatarsal suspicious for osteomyelitis, moderate second toe soft tissue swelling with ulceration distal toe soft tissues with diffuse marrow edema suspicious for osteomyelitis."  Had a disucssion with her and her son in pre op. All questions and concerns were addressed.   Past Medical History:  Diagnosis Date   Arthritis    CAD, NATIVE VESSEL    a. S/p LAD stent in 2006 b. S/p  DES to Lcx 08/2019 and patent LAD stent   DM (diabetes mellitus) (HCC)    Fibromyalgia    HYPERLIPIDEMIA-MIXED    HYPERTENSION, BENIGN    Hypoglycemia 07/29/2023   Neuropathy    Obesity    Osteoarthritis    Sleep apnea    Patient reported on 04/12/13   Vertigo        Latest Ref Rng & Units 09/29/2023    4:48 AM 09/28/2023    4:14 PM 09/27/2023    3:40 AM  CBC  WBC 4.0 - 10.5 K/uL 11.1  12.8  10.8   Hemoglobin 12.0 - 15.0 g/dL 40.9  81.1  91.4   Hematocrit 36.0 - 46.0 % 36.5  38.8  38.4   Platelets 150 - 400 K/uL 276  293  275        Latest Ref Rng & Units 09/29/2023    4:48 AM 09/28/2023    4:14 PM 09/27/2023    3:40 AM  BMP  Glucose 70 - 99 mg/dL 782  956  213   BUN 8 - 23 mg/dL 9  11  15    Creatinine 0.44 - 1.00 mg/dL 0.86  5.78  4.69   Sodium 135 - 145 mmol/L 139  139  136   Potassium 3.5 - 5.1 mmol/L 3.7  4.2  4.0   Chloride 98 - 111 mmol/L 109  107  107   CO2 22 - 32 mmol/L 21  20  22    Calcium 8.9 - 10.3 mg/dL 8.9  9.5  9.1       Physical Exam: General:  AAOx3, NAD   Lower Extremity Exam  Erythema and edema of the 2nd toe left foot with ulceration distal tuft of toe. DP and PT pulses palpable on left fooot. Hammertoe deformity. Prior Left hallux amp at MPJ level - no open wound at that location.     ASSESSMENT/PLAN OF CARE 67 y.o. female with DM2 with neuropathy hyperlipidemia hypertension morbid obesity OSA CAD status post left great toe amputation in October 2024 with concern for osteomyelitis of the second toe left foot and possible residual osteomyelitis first metatarsal head.   - NPO for OR today for Left 2nd toe amputation, 1st met head resection.  - Continue IV abx broad spectrum pending further culture data - Anticoagulation: hold for OR - Wound care: keep dressing c/d/I post op - WB status: WBAT in surgical shoe post op - Will continue to follow   Thank you for the consult.  Please contact me directly with any questions or concerns.            Corinna Gab, DPM [facility]    [address] [place], [place] [address]                Office [phone]  Fax [phone]

## 2023-09-29 NOTE — Progress Notes (Signed)
Orthopedic Tech Progress Note Patient Details:  MAKYLEE LAPEYROUSE 1955-12-17 [id number]  Post Op shoe applied by day shift ortho tech  Ortho Devices Type of Ortho Device: Postop shoe/boot Ortho Device/Splint Location: LLE Ortho Device/Splint Interventions: Ordered, Application, Removal, Other (comment)   Post Interventions Patient Tolerated: Other (comment) Instructions Provided: Adjustment of device, Other (comment)  Diannia Ruder 09/29/2023, 7:10 PM

## 2023-09-29 NOTE — Progress Notes (Signed)
PROGRESS NOTE    Betty [name]  YQM:[id number] DOB: 11/04/1955 DOA: 09/28/2023 PCP: Ailene Ravel, MD    Brief Narrative:   Betty [name] is a 67 y.o. female with past medical history significant for DM2, HTN, HLD, OSA not on CPAP, morbid obesity who presented to [facility] ED on 12/17 by direction of podiatry outpatient with left foot infection concern for osteomyelitis.  Patient was recently admitted to [facility] but left AGAINST MEDICAL ADVICE on 09/25/2023; due to being distraught about needing an amputation.  Was seen again in the outpatient podiatry office on 12/17 with concerns of left foot cellulitis, osteomyelitis to second toe with recommendations of return to the hospital for surgical management.  Of note, blood cultures previously done at [facility] positive for Streptococcus parasanguinous in both anaerobic/aerobic bottles.  In the ED, temperature 97.8 F, HR 71, RR 16, BP 139/69, SpO2 98% room air.  WBC 12.8, hemoglobin 12.7, platelet count 293.  Sodium 139, potassium 4.2, chloride 107, CO2 20, glucose 174, BUN 11, creatinine 0.59.  AST 21, ALT 24, total bilirubin 0.7.  CK1 09.  Procalcitonin less than 0.10.  TSH 2.409.  Blood cultures x 2 obtained.  MR 09/26/2023 with noted interval amputation great toe proximal distal phalanges, mild marrow edema medial aspect great toe metatarsal heads with question sinus tract/soft tissue wound distal first metatarsal suspicious for osteomyelitis, moderate second toe soft tissue swelling with ulceration distal toe soft tissues with diffuse marrow edema suspicious for osteomyelitis.  TRH was consulted for admission for further evaluation management of left foot diabetic infection with osteomyelitis   Assessment & Plan:   Left foot diabetic infection with concern for osteomyelitis Patient presenting to ED by discretion of her outpatient podiatrist for concern of left foot diabetic wound infection and  osteomyelitis.  Recently left AMA from [facility] as she was distraught about a possible need for amputation.  Recent MRI notable for osteomyelitis left foot.  Patient is afebrile with elevated WBC count of 12.8. -- Podiatry following, appreciate assistance -- Vancomycin, pharmacy consulted for dosing/monitoring -- Ceftriaxone 2 g IV every 24 hours -- Norco 1-2 tablets every 4 hours.  Moderate pain -- Fentanyl 12.5-50 mcg IV every 2 hours as needed severe pain --Hold home Plavix, aspirin -- N.p.o. for planned operative management per podiatry this afternoon  Streptococcus para sanguinous septicemia Recent blood cultures 12/14 positive for Streptococcus para sanguinous in both aerobic and anaerobic bottles.  Source likely secondary to diabetic foot infection/osteomyelitis. -- Infectious disease consulted -- Repeat blood cultures 12/17: No growth less than 12 hours -- TTE: Pending -- Continue on vancomycin as above  Type 2 diabetes mellitus Hemoglobin A1c 7.2 09/28/2023.  Patient reports episodes of hypoglycemia.  Home regimen includes insulin NPH 36 units [place] at bedtime, regular insulin sliding scale, metformin 500 mg twice daily. -- Hold home NPH for now -- SSI for coverage -- CBGs q4h while NPO  Essential hypertension -- Carvedilol 3.125 mg p.o. twice daily -- Holding home ramipril  Hyperlipidemia -- Crestor 10 mg p.o. daily  OSA Intolerant of CPAP  Obesity, class III Body mass index is 40.35 kg/m.  On Ozempic outpatient.  Complicates all facets of care   DVT prophylaxis: SCDs Start: 09/28/23 2213    Code Status: Full Code Family Communication: No family present at bedside this morning  Disposition Plan:  Level of care: Med-Surg Status is: Inpatient Remains inpatient appropriate because: Pending surgical intervention for left foot diabetic wound infection,  osteomyelitis    Consultants:  Podiatry Infectious disease disease  Procedures:  TTE:  Pending  Antimicrobials:  Vancomycin 12/17>> Ceftriaxone 12/17>>   Subjective: Patient seen examined bedside, resting calmly.  Sitting in bedside chair.  RN present.  Asking if she can eat, discussed with her that she has pending surgical invention in regards to her osteomyelitis/foot infection this afternoon.  Also perseverating over ensuring that her "Osteo Bi-Flex" has been ordered; states has medication present at bedside.  Continues with mild pain to left foot.  Otherwise no other specific complaints, concerns or questions at this time.  Denies headache, no dizziness, no chest pain, no palpitations, no shortness of breath, no abdominal pain, no fever/chills/night sweats, no nausea/vomiting/diarrhea, no focal weakness, no fatigue, no paresthesia.  No acute events overnight per nursing staff.  Objective: Vitals:   09/28/23 2247 09/28/23 2249 09/29/23 0524 09/29/23 0739  BP: (!) 148/63  (!) 120/47 (!) 143/49  Pulse: 65  70 69  Resp: 18  18   Temp: (!) 97.4 F (36.3 C)  97.6 F (36.4 C) 98 F (36.7 C)  TempSrc: Oral  Oral   SpO2: 100%  96% 94%  Weight:  113.9 kg    Height:  5\' 6"  (1.676 m)      Intake/Output Summary (Last 24 hours) at 09/29/2023 1505 Last data filed at 09/29/2023 1428 Gross per 24 hour  Intake 2174.34 ml  Output --  Net 2174.34 ml   Filed Weights   09/28/23 1540 09/28/23 2249  Weight: 118 kg 113.9 kg    Examination:  Physical Exam: GEN: NAD, alert and oriented x 3, obese, chronically ill in appearance, appears older than stated age HEENT: NCAT, PERRL, EOMI, sclera clear, poor dentition PULM: CTAB w/o wheezes/crackles, normal respiratory effort, on room air CV: RRR w/o M/G/R GI: abd soft, NTND, NABS, no R/G/M MSK: no peripheral edema, moves all extremities independently NEURO: CN II-XII intact, no focal deficits, sensation to light touch intact PSYCH: normal mood/affect Integumentary: Left foot as depicted below with noted great toe amputation,  edema/erythema to second digit with TTP, ulceration and soft tissue defect at second digit distal tip, otherwise no other concerning rashes/lesions/wounds noted on exposed skin surfaces            Data Reviewed: I have personally reviewed following labs and imaging studies  CBC: Recent Labs  Lab 09/25/23 2050 09/26/23 0047 09/26/23 0342 09/27/23 0340 09/28/23 1614 09/29/23 0448  WBC 13.6* 15.2* 13.0* 10.8* 12.8* 11.1*  NEUTROABS 8.7*  --   --   --   --   --   HGB 13.1 12.3 11.8* 12.1 12.7 12.0  HCT 40.8 37.1 37.6 38.4 38.8 36.5  MCV 91.7 91.6 93.1 92.8 89.4 88.6  PLT 298 267 265 275 293 276   Basic Metabolic Panel: Recent Labs  Lab 09/25/23 2050 09/26/23 0047 09/26/23 0342 09/27/23 0340 09/28/23 1614 09/29/23 0448  NA 138  --  138 136 139 139  K 3.8  --  3.8 4.0 4.2 3.7  CL 106  --  106 107 107 109  CO2 24  --  24 22 20* 21*  GLUCOSE 86  --  123* 145* 174* 173*  BUN 14  --  14 15 11 9   CREATININE 0.44 0.58 0.65 0.72 0.59 0.60  CALCIUM 9.3  --  8.8* 9.1 9.5 8.9  MG  --   --   --   --  1.9 1.8  PHOS  --   --   --   --  2.6 2.9   GFR: Estimated Creatinine Clearance: 86.2 mL/min (by C-G formula based on SCr of 0.6 mg/dL). Liver Function Tests: Recent Labs  Lab 09/26/23 0342 09/28/23 1614 09/29/23 0448  AST 20 21 32  ALT 23 24 29   ALKPHOS 69 69 66  BILITOT 0.9 0.7 0.7  PROT 6.8 7.3 6.8  ALBUMIN 3.6 3.6 3.4*   No results for input(s): "LIPASE", "AMYLASE" in the last 168 hours. No results for input(s): "AMMONIA" in the last 168 hours. Coagulation Profile: No results for input(s): "INR", "PROTIME" in the last 168 hours. Cardiac Enzymes: Recent Labs  Lab 09/28/23 1614  CKTOTAL 109   BNP (last 3 results) No results for input(s): "PROBNP" in the last 8760 hours. HbA1C: Recent Labs    09/28/23 1614  HGBA1C 7.2*   CBG: Recent Labs  Lab 09/28/23 2007 09/28/23 2329 09/29/23 0525 09/29/23 0739 09/29/23 1130  GLUCAP 215* 136* 140* 137* 181*    Lipid Profile: No results for input(s): "CHOL", "HDL", "LDLCALC", "TRIG", "CHOLHDL", "LDLDIRECT" in the last 72 hours. Thyroid Function Tests: Recent Labs    09/28/23 1614  TSH 2.409   Anemia Panel: Recent Labs    09/28/23 1614 09/28/23 2233 09/29/23 0448  VITAMINB12  --   --  620  FOLATE  --   --  7.2  FERRITIN  --  122  --   TIBC  --  398  --   IRON  --  64  --   RETICCTPCT 2.1  --   --    Sepsis Labs: Recent Labs  Lab 09/25/23 2050 09/26/23 0047 09/28/23 1614  PROCALCITON  --   --  <0.10  LATICACIDVEN 1.3 0.7  --     Recent Results (from the past 240 hours)  Culture, blood (routine x 2)     Status: Abnormal   Collection Time: 09/25/23  8:50 PM   Specimen: BLOOD  Result Value Ref Range Status   Specimen Description   Final    BLOOD RIGHT ANTECUBITAL Performed at [facility], [address] [place], [place] [address]    Special Requests   Final    BOTTLES DRAWN AEROBIC AND ANAEROBIC Blood Culture results may not be optimal due to an inadequate volume of blood received in culture bottles Performed at [facility], [address], [place], [place] [address]    Culture (A)  Final    STREPTOCOCCUS PARASANGUINIS CRITICAL RESULT CALLED TO, READ BACK BY AND VERIFIED WITH: RN P.DOWD AT 1218 ON 09/28/2023 BY T.SAAD. Performed at [facility], [address], [place], [place] [address]    Report Status 09/29/2023 FINAL  Final   Organism ID, Bacteria STREPTOCOCCUS PARASANGUINIS  Final      Susceptibility   Streptococcus parasanguinis - MIC*    PENICILLIN 0.25 INTERMEDIATE Intermediate     CEFTRIAXONE <=0.12 SENSITIVE Sensitive     ERYTHROMYCIN <=0.12 SENSITIVE Sensitive     LEVOFLOXACIN 1 SENSITIVE Sensitive     VANCOMYCIN 0.5 SENSITIVE Sensitive     * STREPTOCOCCUS PARASANGUINIS  Culture, blood (routine x 2)     Status: None (Preliminary result)   Collection Time: 09/25/23  8:55 PM   Specimen: BLOOD LEFT FOREARM  Result Value  Ref Range Status   Specimen Description   Final    BLOOD LEFT FOREARM Performed at [facility], [address] [place], [place] [address]    Special Requests   Final    BOTTLES DRAWN AEROBIC AND  ANAEROBIC Blood Culture results may not be optimal due to an inadequate volume of blood received in culture bottles Performed at [facility], [address] [place], [place] [address]    Culture   Final    NO GROWTH 4 DAYS Performed at [facility], [address], [place], [place] [address]    Report Status PENDING  Incomplete  Culture, blood (Routine X 2) w Reflex to ID Panel     Status: None (Preliminary result)   Collection Time: 09/28/23 10:29 PM   Specimen: BLOOD  Result Value Ref Range Status   Specimen Description BLOOD SITE NOT SPECIFIED  Final   Special Requests   Final    BOTTLES DRAWN AEROBIC AND ANAEROBIC Blood Culture results may not be optimal due to an inadequate volume of blood received in culture bottles   Culture   Final    NO GROWTH < 12 HOURS Performed at [facility], [address], [place], [place] [address]    Report Status PENDING  Incomplete  Culture, blood (Routine X 2) w Reflex to ID Panel     Status: None (Preliminary result)   Collection Time: 09/28/23 10:36 PM   Specimen: BLOOD  Result Value Ref Range Status   Specimen Description BLOOD SITE NOT SPECIFIED  Final   Special Requests   Final    BOTTLES DRAWN AEROBIC ONLY Blood Culture results may not be optimal due to an inadequate volume of blood received in culture bottles   Culture   Final    NO GROWTH < 12 HOURS Performed at [facility], [address], [place], [place] [address]    Report Status PENDING  Incomplete         Radiology Studies: No results found.      Scheduled Meds:  [MAR Hold] carvedilol  3.125 mg Oral BID WC   [MAR Hold] gabapentin  100 mg Oral QHS   [MAR Hold] insulin aspart  0-9 Units Subcutaneous Q4H   [MAR  Hold] latanoprost  1 drop Both Eyes QHS   [MAR Hold] mirabegron ER  25 mg Oral Daily   [MAR Hold] rosuvastatin  10 mg Oral Daily   Continuous Infusions:  sodium chloride 75 mL/hr at 09/29/23 1132   [MAR Hold] cefTRIAXone (ROCEPHIN)  IV Stopped (09/29/23 0008)   [MAR Hold] vancomycin       LOS: 1 day    Time spent: 56 minutes spent on chart review, discussion with nursing staff, consultants, updating family and interview/physical exam; more than 50% of that time was spent in counseling and/or coordination of care.    Alvira Philips [place], DO [facility] Available via Epic secure chat 7am-7pm After these hours, please refer to coverage provider listed on [email or web address] 09/29/2023, 3:05 PM

## 2023-09-29 NOTE — Evaluation (Addendum)
Physical Therapy Evaluation & discharge Patient Details Name: Betty [name] MRN: [id number] DOB: 28-Apr-1956 Today's Date: 09/29/2023  History of Present Illness  Pt is a 67 y/o F admitted on 09/28/23 after presenting with c/c of wound infection. MRI with concerns for osteomyelitis of the distal and middle phalanges of the second toe on the left foot as well as possible osteomyelitis of the first metatarsal head. PMH: Dm2, neuropathy, HLD, HTN, morbid obesity, OSA, CAD, s/p L great toe amputation (Oct. 2024), CVA, vertigo  Clinical Impression  Pt seen for PT evaluation with pt agreeable to tx. Pt reports prior to admission she was living alone, denies falls. On this date, pt is able to ambulate without AD without LOB, limited by R hip pain. Pt very verbose & emotional re: upcoming sx with PT providing encouragement & therapeutic listening. At this time, pt does not require acute PT services. PT to complete current orders. However, will await new orders once pt is s/p sx.      If plan is discharge home, recommend the following: Assist for transportation;Assistance with cooking/housework   Can travel by private vehicle        Equipment Recommendations None recommended by PT  Recommendations for Other Services       Functional Status Assessment Patient has not had a recent decline in their functional status     Precautions / Restrictions Precautions Precautions: None Restrictions Weight Bearing Restrictions Per Provider Order: No Other Position/Activity Restrictions: Per last hospital admission, WBAT LLE in post op shoe      Mobility  Bed Mobility               General bed mobility comments: not tested, pt received lying in recliner, left sitting EOB    Transfers Overall transfer level: Independent Equipment used: None               General transfer comment: STS from recliner without AD    Ambulation/Gait Ambulation/Gait assistance: Modified independent  (Device/Increase time) Gait Distance (Feet): 200 Feet Assistive device: None Gait Pattern/deviations: Decreased step length - right, Decreased stride length, Decreased step length - left Gait velocity: decreased     General Gait Details: gait distance limited by hip pain (chronic)  Stairs            Wheelchair Mobility     Tilt Bed    Modified Rankin (Stroke Patients Only)       Balance Overall balance assessment: Needs assistance Sitting-balance support: No upper extremity supported, Feet supported Sitting balance-Leahy Scale: Good     Standing balance support: No upper extremity supported, During functional activity Standing balance-Leahy Scale: Good                               Pertinent Vitals/Pain Pain Assessment Pain Assessment: Faces Faces Pain Scale: Hurts a little bit Pain Location: R hip with mobility Pain Descriptors / Indicators: Discomfort Pain Intervention(s): Limited activity within patient's tolerance    Home Living Family/patient expects to be discharged to:: Private residence Living Arrangements: Alone   Type of Home: House Home Access: Stairs to enter Entrance Stairs-Rails: Right;Left (wideset at back door) Entrance Stairs-Number of Steps: 4   Home Layout: One level Home Equipment: Agricultural consultant (2 wheels);Rollator (4 wheels);Cane - single point;Shower seat Additional Comments: suction grab bars in bathroom. son lives in [place] (daughter in jail)    Prior Function Prior Level of Function : Independent/Modified Independent  Mobility Comments: Ambulatory without AD, denies falls. Unable/not allowed? to drive, which has caused great difficulty & stress getting to/from appointments.       Extremity/Trunk Assessment   Upper Extremity Assessment Upper Extremity Assessment: Overall WFL for tasks assessed    Lower Extremity Assessment Lower Extremity Assessment: Overall WFL for tasks assessed;LLE  deficits/detail LLE Deficits / Details: L great toe redness       Communication   Communication Communication: No apparent difficulties  Cognition Arousal: Alert Behavior During Therapy: WFL for tasks assessed/performed, Anxious Overall Cognitive Status: Within Functional Limits for tasks assessed                                 General Comments: anxious/tearful re: upcoming sx        General Comments      Exercises     Assessment/Plan    PT Assessment Patient does not need any further PT services (no further needs at this time, will await new orders s/p sx)  PT Problem List         PT Treatment Interventions      PT Goals (Current goals can be found in the Care Plan section)  Acute Rehab PT Goals Patient Stated Goal: none stated PT Goal Formulation: With patient Time For Goal Achievement: 10/13/23 Potential to Achieve Goals: Good    Frequency       Co-evaluation               AM-PAC PT "6 Clicks" Mobility  Outcome Measure Help needed turning from your back to your side while in a flat bed without using bedrails?: None Help needed moving from lying on your back to sitting on the side of a flat bed without using bedrails?: None Help needed moving to and from a bed to a chair (including a wheelchair)?: None Help needed standing up from a chair using your arms (e.g., wheelchair or bedside chair)?: None Help needed to walk in hospital room?: None Help needed climbing 3-5 steps with a railing? : None 6 Click Score: 24    End of Session Equipment Utilized During Treatment:  (LLE post op shoe) Activity Tolerance: Patient tolerated treatment well;Patient limited by pain Patient left: in bed;with call bell/phone within reach Nurse Communication: Mobility status      Time: 1610-9604 PT Time Calculation (min) (ACUTE ONLY): 29 min   Charges:   PT Evaluation $PT Eval Low Complexity: 1 Low   PT General Charges $$ ACUTE PT VISIT: 1 Visit          Aleda Grana, PT, DPT 09/29/23, 9:02 AM   Sandi Mariscal 09/29/2023, 9:00 AM

## 2023-09-29 NOTE — Consult Note (Signed)
[facility]    Date of Admission:  09/28/2023     Total days of antibiotics                Reason for Consult: Osteomyelitis    Referring Provider: Dr. [place]  Primary Care Provider: Ailene Ravel, MD   ASSESSMENT:  Betty [name] is a 67 y/o caucasian female with diabetes complicated by neuropathy and diabetic foot ulcer with recent great toe amputation admitted with osteomyelitis of the left second toe. Podiatry planning for second toe amputation with first metatarsal resection. Request cultures to be obtained to guide antibiotic therapy although yields may be limited secondary to antibiotics received prior to surgery. Continue current dose of vancomycin and ceftriaxone post-op. Therapeutic drug monitoring of renal function and vancomycin levels. Discussed that duration of therapy will be dependent upon surgical procedure/outcome and that management of comorbid conditions play a significant role in wound healing and risk for future infection. Remaining medical and supportive care per Internal Medicine.   PLAN:  Continue current dose of vancomycin and ceftriaxone post-operatively.  Obtain surgical specimens for culture as able.  Therapeutic drug monitoring of renal function and vancomycin levels per protocol. Surgical management per Podiatry.  Remaining medical and supportive care per Internal Medicine.     Principal Problem:   Osteomyelitis (HCC) Active Problems:   Hyperlipidemia   HYPERTENSION, BENIGN   CAD, NATIVE VESSEL   Sleep apnea   Poorly controlled type 2 diabetes mellitus with circulatory disorder (HCC)   Diabetic foot ulcer_left great toe   Morbid obesity with BMI of 40.0-44.9, adult (HCC)   Acute osteomyelitis of toe, left (HCC)   Bacteremia    carvedilol  3.125 mg Oral BID WC   gabapentin  100 mg Oral QHS   insulin aspart  0-9 Units Subcutaneous Q4H   latanoprost  1 drop Both Eyes QHS   mirabegron ER  25 mg Oral Daily    rosuvastatin  10 mg Oral Daily     HPI: Betty [name] is a 67 y.o. female with previous medical history of Type 2 diabetes complicated by neuropathy and diabetic foot ulcer, coronary artery disease, obstructive sleep apnea and hypertension presenting to the ED for ongoing left foot infection.   Betty [name] was recently seen at [facility] from 09/25/23 until 09/27/23 for cellulitis of the left second toe in the setting of recent left great toe osteomyelitis s/p amputation in October 2024. X-ray showed soft tissue swelling and subcutaneous gas with the first metatarsal webspace. MRI confirmed osteomyelitis of the second toe with Podiatry recommending surgery. Became overwhelmed and left AMA on 09/27/23. Followed up with Dr. Ardelle Anton on 09/28/23 in the Podiatry office with recommendation for second toe amputation.   Betty [name] presented to the ED and was afebrile with mild leukocytosis. No additional imaging was obtained. Previous blood culture from 09/25/23 with Streptococcus parasanguinous. New blood cultures obtained on 09/28/23 have been without growth. Receiving broad spectrum antibiotic coverage with vancomycin, cefepime, and metronidazole which has been narrowed to vancomycin and ceftriaxone. Has elevated levels of anxiety regarding potential risk of needing further amputations in the future. Most recent A1c is 7.2. Podiatry with scheduled OR today.   Review of Systems: Review of Systems  Constitutional:  Negative for chills, fever and weight loss.  Respiratory:  Negative for cough, shortness of breath and wheezing.   Cardiovascular:  Negative for chest pain and leg swelling.  Gastrointestinal:  Negative for abdominal pain,  constipation, diarrhea, nausea and vomiting.  Skin:  Negative for rash.  Psychiatric/Behavioral:  The patient is nervous/anxious.      Past Medical History:  Diagnosis Date   Arthritis    CAD, NATIVE VESSEL    a. S/p LAD stent in 2006 b.  S/p DES to Lcx 08/2019 and patent LAD stent   DM (diabetes mellitus) (HCC)    Fibromyalgia    HYPERLIPIDEMIA-MIXED    HYPERTENSION, BENIGN    Hypoglycemia 07/29/2023   Neuropathy    Obesity    Osteoarthritis    Sleep apnea    Patient reported on 04/12/13   Vertigo     Social History   Tobacco Use   Smoking status: Never   Smokeless tobacco: Never  Vaping Use   Vaping status: Never Used  Substance Use Topics   Alcohol use: No   Drug use: No    Family History  Problem Relation Age of Onset   Heart disease Mother    Cancer Mother        ovarian   Arthritis Mother    Diabetes Mother    Hypertension Mother    Hypertension Father    Stroke Father    Diabetes Sister    Cancer Other    Coronary artery disease Other    Diabetes Other     Allergies  Allergen Reactions   Lyrica [Pregabalin] Other (See Comments)    sleepy   Tape Rash    OBJECTIVE: Blood pressure (!) 143/49, pulse 69, temperature 98 F (36.7 C), resp. rate 18, height 5\' 6"  (1.676 m), weight 113.9 kg, SpO2 94%.  Physical Exam Constitutional:      General: She is not in acute distress.    Appearance: She is well-developed. She is obese.     Comments: Seated on side of the bed; pleasant.   Cardiovascular:     Rate and Rhythm: Normal rate and regular rhythm.     Heart sounds: Normal heart sounds.  Pulmonary:     Effort: Pulmonary effort is normal.     Breath sounds: Normal breath sounds.  Skin:    General: Skin is warm and dry.  Neurological:     Mental Status: She is alert and oriented to person, place, and time.  Psychiatric:        Mood and Affect: Mood is anxious.     Lab Results Lab Results  Component Value Date   WBC 11.1 (H) 09/29/2023   HGB 12.0 09/29/2023   HCT 36.5 09/29/2023   MCV 88.6 09/29/2023   PLT 276 09/29/2023    Lab Results  Component Value Date   CREATININE 0.60 09/29/2023   BUN 9 09/29/2023   NA 139 09/29/2023   K 3.7 09/29/2023   CL 109 09/29/2023   CO2 21  (L) 09/29/2023    Lab Results  Component Value Date   ALT 29 09/29/2023   AST 32 09/29/2023   ALKPHOS 66 09/29/2023   BILITOT 0.7 09/29/2023     Microbiology: Recent Results (from the past 240 hours)  Culture, blood (routine x 2)     Status: Abnormal   Collection Time: 09/25/23  8:50 PM   Specimen: BLOOD  Result Value Ref Range Status   Specimen Description   Final    BLOOD RIGHT ANTECUBITAL Performed at [facility], [address] [place], [place] [address]    Special Requests   Final    BOTTLES DRAWN AEROBIC AND ANAEROBIC Blood Culture results may not  be optimal due to an inadequate volume of blood received in culture bottles Performed at [facility], [address], [place], [place] [address]    Culture (A)  Final    STREPTOCOCCUS PARASANGUINIS CRITICAL RESULT CALLED TO, READ BACK BY AND VERIFIED WITH: RN P.DOWD AT 1218 ON 09/28/2023 BY T.SAAD. Performed at [facility], [address], [place], [place] [address]    Report Status 09/29/2023 FINAL  Final   Organism ID, Bacteria STREPTOCOCCUS PARASANGUINIS  Final      Susceptibility   Streptococcus parasanguinis - MIC*    PENICILLIN 0.25 INTERMEDIATE Intermediate     CEFTRIAXONE <=0.12 SENSITIVE Sensitive     ERYTHROMYCIN <=0.12 SENSITIVE Sensitive     LEVOFLOXACIN 1 SENSITIVE Sensitive     VANCOMYCIN 0.5 SENSITIVE Sensitive     * STREPTOCOCCUS PARASANGUINIS  Culture, blood (routine x 2)     Status: None (Preliminary result)   Collection Time: 09/25/23  8:55 PM   Specimen: BLOOD LEFT FOREARM  Result Value Ref Range Status   Specimen Description   Final    BLOOD LEFT FOREARM Performed at [facility], [address], [place], [place] [address]    Special Requests   Final    BOTTLES DRAWN AEROBIC AND ANAEROBIC Blood Culture results may not be optimal due to an inadequate volume of blood received in culture bottles Performed at [facility], [address], [place], [place] [address]    Culture   Final    NO GROWTH 4 DAYS Performed at [facility], [address], [place], [place] [address]    Report Status PENDING  Incomplete  Culture, blood (Routine X 2) w Reflex to ID Panel     Status: None (Preliminary result)   Collection Time: 09/28/23 10:29 PM   Specimen: BLOOD  Result Value Ref Range Status   Specimen Description BLOOD SITE NOT SPECIFIED  Final   Special Requests   Final    BOTTLES DRAWN AEROBIC AND ANAEROBIC Blood Culture results may not be optimal due to an inadequate volume of blood received in culture bottles   Culture   Final    NO GROWTH < 12 HOURS Performed at [facility], [address], [place], [place] [address]    Report Status PENDING  Incomplete  Culture, blood (Routine X 2) w Reflex to ID Panel     Status: None (Preliminary result)   Collection Time: 09/28/23 10:36 PM   Specimen: BLOOD  Result Value Ref Range Status   Specimen Description BLOOD SITE NOT SPECIFIED  Final   Special Requests   Final    BOTTLES DRAWN AEROBIC ONLY Blood Culture results may not be optimal due to an inadequate volume of blood received in culture bottles   Culture   Final    NO GROWTH < 12 HOURS Performed at [facility], [address], [place], [place] [address]    Report Status PENDING  Incomplete     Marcos Eke, NP [facility]  09/29/2023  2:33 PM

## 2023-09-29 NOTE — Progress Notes (Signed)
OT Cancellation Note  Patient Details Name: Betty [name] MRN: [id number] DOB: 01/25/1956   Cancelled Treatment:    Reason Eval/Treat Not Completed: (P) Other (comment) Pt doing well, in good spirits, surgery planned today, will return tomorrow for initial eval.  Alexis Goodell 09/29/2023, 10:43 AM

## 2023-09-29 NOTE — Op Note (Signed)
Full Operative Report  Date of Operation: 6:59 PM, 09/29/2023   Patient: Betty [name] - 67 y.o. female  Surgeon: [facility], DPM   Assistant: None  Diagnosis: osteomyelitis of left foot  Procedure:  1.  Amputation of second toe at MPJ level, left foot 2.  First metatarsal head resection, left foot    Anesthesia: Monitor Anesthesia Care  Collene Schlichter, MD  Anesthesiologist: Collene Schlichter, MD CRNA: Thomasene Ripple, CRNA   Estimated Blood Loss: Minimal   Hemostasis: 1) Anatomical dissection, mechanical compression, electrocautery 2) no tourniquet was used during procedure  Implants: * No implants in log *  Materials: 3-0 Prolene and skin staples  Injectables: 1) Pre-operatively: 10 cc half percent Marcaine plain 2) Post-operatively: None   Specimens: Pathology: Second toe for pathology and separately first metatarsal head for pathology microbiology: Deep swab culture and separately second distal phalanx bone for culture   Antibiotics: IV antibiotics given per schedule on the floor, Ancef 2 g given in completion of the case  Drains: None  Complications: Patient tolerated the procedure well without complication.   Operative findings: As below in detailed report  Indications for Procedure: Betty [name] presents to [facility], [place] with a chief complaint of erythema and edema of the left second toe.  MRI with concern for osteomyelitis of second toe as well as concern for first metatarsal head residual osteomyelitis.  Prior great toe amputation at MPJ level.  The patient has failed conservative treatments of various modalities. At this time the patient has elected to proceed with surgical correction. All alternatives, risks, and complications of the procedures were thoroughly explained to the patient. Patient exhibits appropriate understanding of all discussion points and informed consent was signed and obtained in the chart with  no guarantees to surgical outcome given or implied.  Description of Procedure: Patient was brought to the operating room. Patient remained on their hospital bed in the supine position. A surgical timeout was performed and all members of the operating room, the procedure, and the surgical site were identified. anesthesia occurred as per anesthesia record. Local anesthetic as previously described was then injected about the operative field in a local infiltrative block.  The operative lower extremity as noted above was then prepped and draped in the usual sterile manner. The following procedure then began.  Attention was directed to the second digit on the left foot. A full-thickness incision encompassing the entire digit was made using a #15 blade. Dissection was carried down to bone.  The incision was carried medially along the prior incision at the great toe joint.  The second toe was secured with a towel clamp, further dissected in its entirety, and disarticulated at the MPJ and passed to the back table as a gross specimen. This was then labled and sent to pathology.  A piece of the distal phalanx bone was removed with rongeur and sent for culture separately.  The bone was noted to be soft and eroded, and consistent with osteomyelitis. All remaining necrotic and devitalized soft tissue structures were visualized and dissected away using sharp and dull dissection.  No purulence was seen during the procedure.  The second metatarsal head appeared without evidence of infection  Next attention was directed the first metatarsal head.  This was dissected with 15 blade with removal of the periosteum to approximately the neck level.  Then using a sagittal saw a beveled osteotomy was performed near the first metatarsal neck.  The first metatarsal  head was then grasped with towel clamp and resected.  This was passed the back table to be sent for pathology. All bleeders were cauterized as necessary. A deep tissue  culture swab was obtained at this time. The area was then flushed with copious amounts of sterile saline 1 L via power pulse lavage. Then using the suture materials previously described, the site was closed in anatomic layers and the skin was well approximated under minimal tension.  The surgical site was then dressed with Xeroform 4 x 4 Kerlix Ace. The patient tolerated both the procedure and anesthesia well with vital signs stable throughout. The patient was transferred in good condition and all vital signs stable  from the OR to recovery under the discretion of anesthesia.  Condition: Vital signs stable, neurovascular status unchanged from preoperative   Surgical plan:  Expect surgical cure of infection with clean margins.  Pathology and cultures sent.  Patient will be stable for discharge in 1 to 2 days.  Weightbearing as tolerated in the postop shoe.  Follow-up in office late next week.  Leave dressing clean dry and intact until follow-up  The patient will be weightbearing for transfers and short distances only in a postop shoe to the operative limb until further instructed. The dressing is to remain clean, dry, and intact. Will continue to follow unless noted elsewhere.   Betty [name], DPM [facility]

## 2023-09-30 ENCOUNTER — Encounter (HOSPITAL_COMMUNITY): Payer: Self-pay | Admitting: Podiatry

## 2023-09-30 ENCOUNTER — Inpatient Hospital Stay (HOSPITAL_COMMUNITY): Payer: Medicare Other

## 2023-09-30 DIAGNOSIS — R7881 Bacteremia: Secondary | ICD-10-CM

## 2023-09-30 DIAGNOSIS — M86172 Other acute osteomyelitis, left ankle and foot: Secondary | ICD-10-CM | POA: Diagnosis not present

## 2023-09-30 LAB — GLUCOSE, CAPILLARY
Glucose-Capillary: 138 mg/dL — ABNORMAL HIGH (ref 70–99)
Glucose-Capillary: 147 mg/dL — ABNORMAL HIGH (ref 70–99)
Glucose-Capillary: 238 mg/dL — ABNORMAL HIGH (ref 70–99)
Glucose-Capillary: 252 mg/dL — ABNORMAL HIGH (ref 70–99)
Glucose-Capillary: 225 mg/dL — ABNORMAL HIGH (ref 70–99)

## 2023-09-30 LAB — BASIC METABOLIC PANEL
Anion gap: 7 (ref 5–15)
BUN: 9 mg/dL (ref 8–23)
CO2: 23 mmol/L (ref 22–32)
Calcium: 8.6 mg/dL — ABNORMAL LOW (ref 8.9–10.3)
Chloride: 111 mmol/L (ref 98–111)
Creatinine, Ser: 0.65 mg/dL (ref 0.44–1.00)
GFR, Estimated: >60 mL/min (ref >60)
Glucose, Bld: 149 mg/dL — ABNORMAL HIGH (ref 70–99)
Potassium: 3.9 mmol/L (ref 3.5–5.1)
Sodium: 141 mmol/L (ref 135–145)

## 2023-09-30 LAB — CBC
HCT: 32.8 % — ABNORMAL LOW (ref 36.0–46.0)
Hemoglobin: 10.7 g/dL — ABNORMAL LOW (ref 12.0–15.0)
MCH: 29.3 pg (ref 26.0–34.0)
MCHC: 32.6 g/dL (ref 30.0–36.0)
MCV: 89.9 fL (ref 80.0–100.0)
Platelets: 235 10*3/uL (ref 150–400)
RBC: 3.65 MIL/uL — ABNORMAL LOW (ref 3.87–5.11)
RDW: 13.3 % (ref 11.5–15.5)
WBC: 9.6 10*3/uL (ref 4.0–10.5)
nRBC: 0 % (ref 0.0–0.2)

## 2023-09-30 LAB — CULTURE, BLOOD (ROUTINE X 2): Culture: NO GROWTH

## 2023-09-30 LAB — ECHOCARDIOGRAM COMPLETE
Area-P 1/2: 3.53 cm2
Height: 66 in
S' Lateral: 2.9 cm
Weight: 4000 [oz_av]

## 2023-09-30 LAB — MAGNESIUM: Magnesium: 1.8 mg/dL (ref 1.7–2.4)

## 2023-09-30 LAB — VANCOMYCIN, RANDOM: Vancomycin Rm: 10 ug/mL

## 2023-09-30 MED ORDER — LOPERAMIDE HCL 2 MG PO CAPS: 2.0000 mg | ORAL_CAPSULE | ORAL | Status: DC | PRN

## 2023-09-30 MED ORDER — ADULT MULTIVITAMIN W/MINERALS CH
1.0000 | ORAL_TABLET | Freq: Every day | ORAL | Status: DC
  Administered 2023-09-30 – 2023-10-01 (×2): 1 via ORAL
  Filled 2023-09-30 (×2): qty 1

## 2023-09-30 MED ORDER — JUVEN PO PACK
1.0000 | PACK | Freq: Two times a day (BID) | ORAL | Status: DC
  Administered 2023-09-30: 1 via ORAL
  Filled 2023-09-30: qty 1

## 2023-09-30 MED ORDER — INSULIN ASPART 100 UNIT/ML IJ SOLN
0.0000 [IU] | Freq: Three times a day (TID) | INTRAMUSCULAR | Status: DC
  Administered 2023-09-30 – 2023-10-01 (×2): 5 [IU] via SUBCUTANEOUS
  Administered 2023-10-01: 3 [IU] via SUBCUTANEOUS

## 2023-09-30 NOTE — Anesthesia Postprocedure Evaluation (Signed)
Anesthesia Post Note  Patient: Betty [name]  Procedure(s) Performed: SECOND TOE AMPUTATION FIRST METATARSAL HEAD RESECTION (Left: Toe)     Patient location during evaluation: PACU Anesthesia Type: MAC Level of consciousness: awake and alert Pain management: pain level controlled Vital Signs Assessment: post-procedure vital signs reviewed and stable Respiratory status: spontaneous breathing, nonlabored ventilation, respiratory function stable and patient connected to nasal cannula oxygen Cardiovascular status: stable and blood pressure returned to baseline Postop Assessment: no apparent nausea or vomiting Anesthetic complications: no   No notable events documented.  Last Vitals:    Last Pain:                 Collene Schlichter

## 2023-09-30 NOTE — Progress Notes (Signed)
PROGRESS NOTE    Betty [name]  XLK:[id number] DOB: 1955-12-07 DOA: 09/28/2023 PCP: Ailene Ravel, MD    Brief Narrative:   Betty [name] is a 67 y.o. female with past medical history significant for DM2, HTN, HLD, OSA not on CPAP, morbid obesity who presented to [facility] ED on 12/17 by direction of podiatry outpatient with left foot infection concern for osteomyelitis.  Patient was recently admitted to [facility] but left AGAINST MEDICAL ADVICE on 09/25/2023; due to being distraught about needing an amputation.  Was seen again in the outpatient podiatry office on 12/17 with concerns of left foot cellulitis, osteomyelitis to second toe with recommendations of return to the hospital for surgical management.  Of note, blood cultures previously done at [facility] positive for Streptococcus parasanguinous in both anaerobic/aerobic bottles.  In the ED, temperature 97.8 F, HR 71, RR 16, BP 139/69, SpO2 98% room air.  WBC 12.8, hemoglobin 12.7, platelet count 293.  Sodium 139, potassium 4.2, chloride 107, CO2 20, glucose 174, BUN 11, creatinine 0.59.  AST 21, ALT 24, total bilirubin 0.7.  CK1 09.  Procalcitonin less than 0.10.  TSH 2.409.  Blood cultures x 2 obtained.  MR 09/26/2023 with noted interval amputation great toe proximal distal phalanges, mild marrow edema medial aspect great toe metatarsal heads with question sinus tract/soft tissue wound distal first metatarsal suspicious for osteomyelitis, moderate second toe soft tissue swelling with ulceration distal toe soft tissues with diffuse marrow edema suspicious for osteomyelitis.  TRH was consulted for admission for further evaluation management of left foot diabetic infection with osteomyelitis   Assessment & Plan:   Left foot diabetic infection with concern for osteomyelitis Patient presenting to ED by discretion of her outpatient podiatrist for concern of left foot diabetic wound infection and  osteomyelitis.  Recently left AMA from [facility] as she was distraught about a possible need for amputation.  Recent MRI notable for osteomyelitis left foot.  Patient is afebrile with elevated WBC count of 12.8. Podiatry was consulted and patient underwent amputation second toe MPJ level and first metatarsal head resection left foot by Dr. Annamary Rummage on 09/29/2023. -- Podiatry following, appreciate assistance -- Operative culture with random GPC's, further pending -- Vancomycin, pharmacy consulted for dosing/monitoring -- Ceftriaxone 2 g IV every 24 hours -- Norco 1-2 tablets every 4 hours.  Moderate pain -- Fentanyl 12.5-50 mcg IV every 2 hours as needed severe pain -- WBAT LLE w/ postoperative shoe for short distances/transfers -- Dressing change tomorrow per podiatry  Streptococcus parasanguinous septicemia Recent blood cultures 12/14 positive for Streptococcus para sanguinous in both aerobic and anaerobic bottles.  Source likely secondary to diabetic foot infection/osteomyelitis. -- Infectious disease consulted -- Repeat blood cultures 12/17: No growth x 2 days -- TTE: Pending -- Continue on vancomycin as above  Type 2 diabetes mellitus Hemoglobin A1c 7.2 09/28/2023.  Patient reports episodes of hypoglycemia.  Home regimen includes insulin NPH 36 units [place] at bedtime, regular insulin sliding scale, metformin 500 mg twice daily. -- Hold home NPH for now -- SSI for coverage -- CBGs before every meal/at bedtime  Essential hypertension -- Carvedilol 3.125 mg p.o. twice daily -- Holding home ramipril  Hyperlipidemia -- Crestor 10 mg p.o. daily  OSA Intolerant of CPAP  Obesity, class III Body mass index is 40.35 kg/m.  On Ozempic outpatient.  Complicates all facets of care   DVT prophylaxis: SCDs Start: 09/28/23 2213    Code Status: Full Code Family Communication:  Updated son present at bedside this morning  Disposition Plan:  Level of care: Med-Surg Status is:  Inpatient Remains inpatient appropriate because: Pending TTE, awaiting PT/OT evaluation, pending finalized ID recommendations    Consultants:  Podiatry Infectious disease disease  Procedures:  TTE: Pending amputation second toe MPJ level and first metatarsal head resection left foot by Dr. Annamary Rummage on 09/29/2023.  Antimicrobials:  Vancomycin 12/17>> Ceftriaxone 12/17>>   Subjective: Patient seen examined bedside, resting calmly.  Sitting at edge of bed completing Christmas cards.  Pain controlled.  Seen by podiatry this morning with plan changing of dressing tomorrow.  Son present at bedside and updated.  Continues on IV antibiotics.  Awaiting ID recommendations for antibiotics on discharge. No specific complaints, concerns or questions at this time.  Denies headache, no dizziness, no chest pain, no palpitations, no shortness of breath, no abdominal pain, no fever/chills/night sweats, no nausea/vomiting/diarrhea, no focal weakness, no fatigue, no paresthesia.  No acute events overnight per nursing staff.  Objective: Vitals:   09/29/23 2340 09/30/23 0015 09/30/23 0423 09/30/23 0734  BP: (!) 161/60 (!) 156/62 (!) 130/43 (!) 145/51  Pulse: 66  69 75  Resp: 18  18   Temp: 97.7 F (36.5 C)  97.9 F (36.6 C) (!) 97.4 F (36.3 C)  TempSrc: Oral  Oral Oral  SpO2: 94%  96% 97%  Weight:      Height:        Intake/Output Summary (Last 24 hours) at 09/30/2023 1238 Last data filed at 09/30/2023 9147 Gross per 24 hour  Intake 1298.18 ml  Output 3 ml  Net 1295.18 ml   Filed Weights   09/28/23 1540 09/28/23 2249 09/29/23 1506  Weight: 118 kg 113.9 kg 113.4 kg    Examination:  Physical Exam: GEN: NAD, alert and oriented x 3, obese, chronically ill in appearance, appears older than stated age HEENT: NCAT, PERRL, EOMI, sclera clear, poor dentition PULM: CTAB w/o wheezes/crackles, normal respiratory effort, on room air CV: RRR w/o M/G/R GI: abd soft, NTND, NABS, no R/G/M MSK: no  peripheral edema, moves all extremities independently NEURO: CN II-XII intact, no focal deficits, sensation to light touch intact PSYCH: normal mood/affect Integumentary: Left foot with surgical dressing covered with Ace wrap in place, clean/dry/intact      Data Reviewed: I have personally reviewed following labs and imaging studies  CBC: Recent Labs  Lab 09/25/23 2050 09/26/23 0047 09/26/23 0342 09/27/23 0340 09/28/23 1614 09/29/23 0448 09/30/23 0431  WBC 13.6*   < > 13.0* 10.8* 12.8* 11.1* 9.6  NEUTROABS 8.7*  --   --   --   --   --   --   HGB 13.1   < > 11.8* 12.1 12.7 12.0 10.7*  HCT 40.8   < > 37.6 38.4 38.8 36.5 32.8*  MCV 91.7   < > 93.1 92.8 89.4 88.6 89.9  PLT 298   < > 265 275 293 276 235   < > = values in this interval not displayed.   Basic Metabolic Panel: Recent Labs  Lab 09/26/23 0342 09/27/23 0340 09/28/23 1614 09/29/23 0448 09/30/23 0431  NA 138 136 139 139 141  K 3.8 4.0 4.2 3.7 3.9  CL 106 107 107 109 111  CO2 24 22 20* 21* 23  GLUCOSE 123* 145* 174* 173* 149*  BUN 14 15 11 9 9   CREATININE 0.65 0.72 0.59 0.60 0.65  CALCIUM 8.8* 9.1 9.5 8.9 8.6*  MG  --   --  1.9 1.8  1.8  PHOS  --   --  2.6 2.9  --    GFR: Estimated Creatinine Clearance: 86 mL/min (by C-G formula based on SCr of 0.65 mg/dL). Liver Function Tests: Recent Labs  Lab 09/26/23 0342 09/28/23 1614 09/29/23 0448  AST 20 21 32  ALT 23 24 29   ALKPHOS 69 69 66  BILITOT 0.9 0.7 0.7  PROT 6.8 7.3 6.8  ALBUMIN 3.6 3.6 3.4*   No results for input(s): "LIPASE", "AMYLASE" in the last 168 hours. No results for input(s): "AMMONIA" in the last 168 hours. Coagulation Profile: No results for input(s): "INR", "PROTIME" in the last 168 hours. Cardiac Enzymes: Recent Labs  Lab 09/28/23 1614  CKTOTAL 109   BNP (last 3 results) No results for input(s): "PROBNP" in the last 8760 hours. HbA1C: Recent Labs    09/28/23 1614  HGBA1C 7.2*   CBG: Recent Labs  Lab 09/29/23 2005  09/29/23 2342 09/30/23 0420 09/30/23 0736 09/30/23 1211  GLUCAP 127* 192* 138* 147* 238*   Lipid Profile: No results for input(s): "CHOL", "HDL", "LDLCALC", "TRIG", "CHOLHDL", "LDLDIRECT" in the last 72 hours. Thyroid Function Tests: Recent Labs    09/28/23 1614  TSH 2.409   Anemia Panel: Recent Labs    09/28/23 1614 09/28/23 2233 09/29/23 0448  VITAMINB12  --   --  620  FOLATE  --   --  7.2  FERRITIN  --  122  --   TIBC  --  398  --   IRON  --  64  --   RETICCTPCT 2.1  --   --    Sepsis Labs: Recent Labs  Lab 09/25/23 2050 09/26/23 0047 09/28/23 1614  PROCALCITON  --   --  <0.10  LATICACIDVEN 1.3 0.7  --     Recent Results (from the past 240 hours)  Culture, blood (routine x 2)     Status: Abnormal   Collection Time: 09/25/23  8:50 PM   Specimen: BLOOD  Result Value Ref Range Status   Specimen Description   Final    BLOOD RIGHT ANTECUBITAL Performed at [facility], [address] [place], [place] [address]    Special Requests   Final    BOTTLES DRAWN AEROBIC AND ANAEROBIC Blood Culture results may not be optimal due to an inadequate volume of blood received in culture bottles Performed at [facility], [address] [place], [place] [address]    Culture (A)  Final    STREPTOCOCCUS PARASANGUINIS CRITICAL RESULT CALLED TO, READ BACK BY AND VERIFIED WITH: RN P.DOWD AT 1218 ON 09/28/2023 BY T.SAAD. Performed at [facility], [address], [place], [place] [address]    Report Status 09/29/2023 FINAL  Final   Organism ID, Bacteria STREPTOCOCCUS PARASANGUINIS  Final      Susceptibility   Streptococcus parasanguinis - MIC*    PENICILLIN 0.25 INTERMEDIATE Intermediate     CEFTRIAXONE <=0.12 SENSITIVE Sensitive     ERYTHROMYCIN <=0.12 SENSITIVE Sensitive     LEVOFLOXACIN 1 SENSITIVE Sensitive     VANCOMYCIN 0.5 SENSITIVE Sensitive     * STREPTOCOCCUS PARASANGUINIS  Culture, blood (routine x 2)     Status: None   Collection  Time: 09/25/23  8:55 PM   Specimen: BLOOD LEFT FOREARM  Result Value Ref Range Status   Specimen Description   Final    BLOOD LEFT FOREARM Performed at [facility], [address], [place], [place] [address]    Special Requests  Final    BOTTLES DRAWN AEROBIC AND ANAEROBIC Blood Culture results may not be optimal due to an inadequate volume of blood received in culture bottles Performed at [facility], [address], [place], [place] [address]    Culture   Final    NO GROWTH 5 DAYS Performed at [facility], [address], [place], [place] [address]    Report Status 09/30/2023 FINAL  Final  Culture, blood (Routine X 2) w Reflex to ID Panel     Status: None (Preliminary result)   Collection Time: 09/28/23 10:29 PM   Specimen: BLOOD  Result Value Ref Range Status   Specimen Description BLOOD SITE NOT SPECIFIED  Final   Special Requests   Final    BOTTLES DRAWN AEROBIC AND ANAEROBIC Blood Culture results may not be optimal due to an inadequate volume of blood received in culture bottles   Culture   Final    NO GROWTH 2 DAYS Performed at [facility], [address], [place], [place] [address]    Report Status PENDING  Incomplete  Culture, blood (Routine X 2) w Reflex to ID Panel     Status: None (Preliminary result)   Collection Time: 09/28/23 10:36 PM   Specimen: BLOOD  Result Value Ref Range Status   Specimen Description BLOOD SITE NOT SPECIFIED  Final   Special Requests   Final    BOTTLES DRAWN AEROBIC ONLY Blood Culture results may not be optimal due to an inadequate volume of blood received in culture bottles   Culture   Final    NO GROWTH 2 DAYS Performed at [facility], [address], [place], [place] [address]    Report Status PENDING  Incomplete  Aerobic/Anaerobic Culture w Gram Stain (surgical/deep wound)     Status: None (Preliminary result)   Collection Time: 09/29/23  6:33 PM   Specimen: Toe, Left;  Amputation  Result Value Ref Range Status   Specimen Description WOUND  Final   Special Requests LEFT METATARSAL  Final   Gram Stain NO WBC SEEN NO ORGANISMS SEEN   Final   Culture   Final    NO GROWTH < 12 HOURS Performed at [facility], [address], [place], [place] [address]    Report Status PENDING  Incomplete  Aerobic/Anaerobic Culture w Gram Stain (surgical/deep wound)     Status: None (Preliminary result)   Collection Time: 09/29/23  6:34 PM   Specimen: Toe, Left; Amputation  Result Value Ref Range Status   Specimen Description BONE  Final   Special Requests LEFT TOE BONE FRAGMENT  Final   Gram Stain NO WBC SEEN RARE GRAM POSITIVE COCCI   Final   Culture   Final    TOO YOUNG TO READ Performed at [facility], [address] [place], [place] [address]    Report Status PENDING  Incomplete         Radiology Studies: DG Foot 2 Views Left Result Date: 09/29/2023 CLINICAL DATA:  Postoperative state, wound infection. EXAM: LEFT FOOT - 2 VIEW COMPARISON:  09/26/2023, 09/25/2023. FINDINGS: Examination is limited due to overlying bandage material and shoe. There has been amputation of the first and second digits and transmetatarsal amputation of the first metatarsal. The remaining bony structures appear intact. No acute fracture or dislocation is seen. Degenerative changes are noted in the midfoot. Large calcaneal spur is noted. Enthesopathic changes are seen in the insertion site of the  Achilles tendon. Vascular calcifications are noted in the soft tissues. Surgical clips are present over the distal forefoot. IMPRESSION: Status post amputation of the first and second digits and transmetatarsal amputation of the distal first metatarsal. Electronically Signed   By: Thornell Sartorius M.D.   On: 09/29/2023 23:31        Scheduled Meds:  carvedilol  3.125 mg Oral BID WC   diclofenac Sodium  4 g Topical QID   gabapentin  100 mg Oral QHS   insulin aspart  0-9 Units  Subcutaneous Q4H   latanoprost  1 drop Both Eyes QHS   mirabegron ER  25 mg Oral Daily   multivitamin with minerals  1 tablet Oral Daily   nutrition supplement (JUVEN)  1 packet Oral BID BM   Osteo Bi-Flex Adv Triple St  1 tablet Oral BID   rosuvastatin  10 mg Oral Daily   Continuous Infusions:  cefTRIAXone (ROCEPHIN)  IV Stopped (09/30/23 0038)   vancomycin 1,500 mg (09/30/23 0844)     LOS: 2 days    Time spent: 51 minutes spent on chart review, discussion with nursing staff, consultants, updating family and interview/physical exam; more than 50% of that time was spent in counseling and/or coordination of care.    Alvira Philips [place], DO [facility] Available via Epic secure chat 7am-7pm After these hours, please refer to coverage provider listed on [email or web address] 09/30/2023, 12:38 PM

## 2023-09-30 NOTE — Transfer of Care (Signed)
Immediate Anesthesia Transfer of Care Note  Patient: Betty [name]  Procedure(s) Performed: SECOND TOE AMPUTATION FIRST METATARSAL HEAD RESECTION (Left: Toe)  Patient Location: PACU  Anesthesia Type:MAC  Level of Consciousness: awake  Airway & Oxygen Therapy: Patient Spontanous Breathing  Post-op Assessment: Report given to RN and Post -op Vital signs reviewed and stable  Post vital signs: Reviewed and stable  Last Vitals:  Vitals Value Taken Time  BP 130/43 09/30/23 0423  Temp 36.6 C 09/30/23 0423  Pulse 69 09/30/23 0423  Resp 18 09/30/23 0423  SpO2 96 % 09/30/23 0423    Last Pain:  Vitals:   09/30/23 0423  TempSrc: Oral  PainSc:          Complications: No notable events documented.

## 2023-09-30 NOTE — Progress Notes (Signed)
  Subjective:  Patient ID: Betty [name], female    DOB: 1956-06-10,  MRN: [id number]  Chief Complaint  Patient presents with   Wound Infection    DOS: 09/29/23 Procedure: 1.  Amputation of second toe at MPJ level, left foot 2.  First metatarsal head resection, left foot  67 y.o. female seen for post op check.  Patient seen postop day 1 status post above procedures.  She denies pain at this time.  She is overall doing well.  All questions and concerns were addressed with her and her son who was present bedside.  Discussed surgical findings with her in detail as well as plans going forward  Review of Systems: Negative except as noted in the HPI. Denies N/V/F/Ch.   Objective:   Vitals:   09/30/23 0423 09/30/23 0734  BP: (!) 130/43 (!) 145/51  Pulse: 69 75  Resp: 18   Temp: 97.9 F (36.6 C) (!) 97.4 F (36.3 C)  SpO2: 96% 97%   Body mass index is 40.35 kg/m. Constitutional Well developed. Well nourished.  Vascular Foot warm and well perfused. Capillary refill normal to all digits.   No calf pain with palpation  Neurologic Normal speech. Oriented to person, place, and time. Epicritic sensation absent to the forefoot  Dermatologic Dressing clean dry and intact to the left foot  Orthopedic: Status post second toe amputation and first metatarsal head resection left foot   Radiographs: Status post amputation of the first and second digits and transmetatarsal amputation of the distal first metatarsal.    Pathology: Pending  Micro: Left 2nd toe bone fragment-rare gram-positive cocci pending  Assessment:   osteomyelitis of left second toe status post amputation.  Status post first metatarsal head resection left  Plan:  Patient was evaluated and treated and all questions answered.  POD # 1 s/p L 2nd toe amp, 1st met head resection -Progressing as expected, denies pain -XR: expected postop changes -WB Status: WBAT for short distances/transfer in post op  shoe -Sutures: to remain intact. -Medications/ABX: abx per ID - recommend consult for outpatient PO abx selection. Do believe all infected bone /soft tissue has been resected -Dressing left intact today, will change tomorrow         Corinna Gab, DPM [facility] / [facility]

## 2023-09-30 NOTE — Plan of Care (Signed)
Problem: Education: Goal: Ability to describe self-care measures that may prevent or decrease complications (Diabetes Survival Skills Education) will improve Outcome: Progressing Pt has a history of diabetes.  While hospitalized her CBG will be checked q4 hours per MD's orders.  She is on slinding scale fo insulin coverage.    Problem: Education: Goal: Knowledge of General Education information will improve Description: Including pain rating scale, medication(s)/side effects and non-pharmacologic comfort measures Outcome: Progressing Pt understands that she was admitted into the hospital for left foot 2nd toe infection.  She has an admitting diagnosis of osteomyelitis of that foot.  She had an amputation of 2nd toe to left foot on 09/30/2023.  Pt is on IV abx per MD's orders.    Problem: Clinical Measurements: Goal: Will remain free from infection Outcome: Progressing S/Sx of infection monitored and assessed q-shift.  Pt has remained afebrile thus far.  She is on IV abx per MD's orders.       Problem: Clinical Measurements: Goal: Respiratory complications will improve Outcome: Progressing Respiratory status monitored and assessed q-shift.  Pt is on room air with PO2 at 94-97% and respiration rate of 18 breaths per minute.  Pt has not endorsed c/o SOB or DOE.    Problem: Clinical Measurements: Goal: Cardiovascular complication will be avoided Outcome: Progressing Pt's WNL thus far.    Problem: Activity: Goal: Risk for activity intolerance will decrease Outcome: Progressing Pt is independent of all her ADLs.  She can get up OOB independently with a cane.      Problem: Nutrition: Goal: Adequate nutrition will be maintained Outcome: Progressing Pt is on a carb modified diet per MD's orders and can tolerate it w/o s/sx of abdominal pain/ distention or n/v.      Problem: Safety: Goal: Ability to remain free from injury will improve Outcome: Progressing Pt has remained free from falls  thus far.  Instructed pt to utilize RN call light for assistance.  Hourly rounds performed.  Bed in lowest position, locked with two upper side rails engaged.  Belongings and call light within reach.    Problem: Skin Integrity: Goal: Risk for impaired skin integrity will decrease Outcome: Progressing Skin integrity monitored and assessed q-shift.  Instructed pt to turn q2 hours to prevent further skin impairment.  Tubes and drains assessed for device related pressures sores.  Pt is continent of both bowel and bladder.

## 2023-09-30 NOTE — Evaluation (Signed)
Occupational Therapy Evaluation Patient Details Name: Betty [name] MRN: [id number] DOB: [birth year] Today's Date: 09/30/2023   History of Present Illness Pt is a 67 y/o F admitted on 09/28/23 after presenting with c/c of wound infection, s/p L 1st metatarsal and 2nd toe amputation. MRI with concerns for osteomyelitis of the distal and middle phalanges of the second toe on the left foot as well as possible osteomyelitis of the first metatarsal head. PMH: Dm2, neuropathy, HLD, HTN, morbid obesity, OSA, CAD, s/p L great toe amputation (Oct. 2024), CVA, vertigo   Clinical Impression   Pt doing well, in good spirits, minimal pain to L foot 3/10. Pt lives alone, son will be staying with her for 2 weeks to assist as needed, has 4 stairs to enter home. Pt currently at baseline, able to ambulate as needed with supervision using cane, no LOB, with post-op shoe. Pt completed transfer to [facility] and completed with set up. Pt and son educated on safety awareness and use of post-op shoe for mobility. Pt states she has all DME at home needed to remain safe, son will assist as needed, Pt has no further acute OT or follow up needs.       If plan is discharge home, recommend the following: A little help with walking and/or transfers;A little help with bathing/dressing/bathroom;Assist for transportation;Help with stairs or ramp for entrance    Functional Status Assessment  Patient has had a recent decline in their functional status and demonstrates the ability to make significant improvements in function in a reasonable and predictable amount of time.  Equipment Recommendations  None recommended by OT    Recommendations for Other Services       Precautions / Restrictions Precautions Precautions: None Restrictions Weight Bearing Restrictions Per Provider Order: Yes LLE Weight Bearing Per Provider Order: Partial weight bearing LLE Partial Weight Bearing Percentage or Pounds: for tranfers and short  distances only with post up shoe on      Mobility Bed Mobility Overal bed mobility: Modified Independent                  Transfers Overall transfer level: Modified independent                        Balance Overall balance assessment: Modified Independent                                         ADL either performed or assessed with clinical judgement   ADL Overall ADL's : Needs assistance/impaired;At baseline Eating/Feeding: Independent   Grooming: Supervision/safety;Standing   Upper Body Bathing: Supervision/ safety   Lower Body Bathing: With adaptive equipment;Supervison/ safety   Upper Body Dressing : Supervision/safety   Lower Body Dressing: Moderate assistance;Sit to/from stand   Toilet Transfer: Supervision/safety   Toileting- Architect and Hygiene: Supervision/safety   Tub/ Engineer, structural: Supervision/safety   Functional mobility during ADLs: Supervision/safety General ADL Comments: Pt supervision for most ADLs/mobility, at baseline. Pt does need help for LB dressing at baseline, uses slip ons, will need help with post op shoe, son will assist for next 2 weeks at home.     Vision Baseline Vision/History: 1 Wears glasses;4 Cataracts Ability to See in Adequate Light: 1 Impaired Patient Visual Report: No change from baseline       Perception         Praxis  Pertinent Vitals/Pain Pain Assessment Pain Assessment: 0-10 Pain Score: 3  Pain Location: L foot Pain Descriptors / Indicators: Discomfort, Aching Pain Intervention(s): Monitored during session     Extremity/Trunk Assessment Upper Extremity Assessment Upper Extremity Assessment: Overall WFL for tasks assessed           Communication Communication Communication: No apparent difficulties   Cognition Arousal: Alert Behavior During Therapy: WFL for tasks assessed/performed, Anxious Overall Cognitive Status: Within Functional Limits  for tasks assessed                                       General Comments       Exercises     Shoulder Instructions      Home Living Family/patient expects to be discharged to:: Private residence Living Arrangements: Alone Available Help at Discharge: Neighbor;Available PRN/intermittently Type of Home: House Home Access: Stairs to enter [employer] of Steps: 4 Entrance Stairs-Rails: Right;Left Home Layout: One level     Bathroom Shower/Tub: Tub/shower unit         Home Equipment: Agricultural consultant (2 wheels);Rollator (4 wheels);Cane - single point;Shower seat   Additional Comments: Son will be with Pt for next 2 weeks to assist as needed, normally lives in [place]. suction grab bars in bathroom.      Prior Functioning/Environment Prior Level of Function : Independent/Modified Independent             Mobility Comments: Ambulatory without AD, denies falls. Unable/not allowed? to drive, which has caused great difficulty & stress getting to/from appointments. ADLs Comments: modI baseline, wears poise pads due to urge incontinence at baseline, some difficulty with cleaning        OT Problem List: Impaired balance (sitting and/or standing);Pain      OT Treatment/Interventions:      OT Goals(Current goals can be found in the care plan section) Acute Rehab OT Goals Patient Stated Goal: to return home OT Goal Formulation: With patient/family Time For Goal Achievement: 10/14/23 Potential to Achieve Goals: Good  OT Frequency:      Co-evaluation              AM-PAC OT "6 Clicks" Daily Activity     Outcome Measure Help from another person eating meals?: None Help from another person taking care of personal grooming?: A Little Help from another person toileting, which includes using toliet, bedpan, or urinal?: A Little Help from another person bathing (including washing, rinsing, drying)?: A Little Help from another person to put on and  taking off regular upper body clothing?: A Little Help from another person to put on and taking off regular lower body clothing?: A Little 6 Click Score: 19   End of Session Equipment Utilized During Treatment: Gait belt;Other (comment) (cane) Nurse Communication: Mobility status  Activity Tolerance: Patient tolerated treatment well Patient left: in bed;with call bell/phone within reach;Other (comment) (with Echo tech)  OT Visit Diagnosis: Other abnormalities of gait and mobility (R26.89);Pain Pain - Right/Left: Left Pain - part of body: Ankle and joints of foot                Time: 1610-9604 OT Time Calculation (min): 47 min Charges:  OT General Charges $OT Visit: 1 Visit OT Evaluation $OT Eval Low Complexity: 1 Low OT Treatments $Self Care/Home Management : 8-22 mins $Therapeutic Activity: 8-22 mins  Yitzchak Kothari, OTR/L   Betty [name] 09/30/2023, 11:42 AM

## 2023-09-30 NOTE — Progress Notes (Signed)
  Echocardiogram 2D Echocardiogram has been performed.  Betty [name] 09/30/2023, 11:56 AM

## 2023-09-30 NOTE — TOC Progression Note (Addendum)
Transition of Care [facility]) - Progression Note    Patient Details  Name: Betty [name] MRN: [id number] Date of Birth: 06/16/1956  Transition of Care [facility]) CM/SW Contact  Marliss Coots, LCSW Phone Number: 09/30/2023, 3:09 PM  Clinical Narrative:     3:09 PM CSW introduced herself and role to patient at bedside. CSW followed up on SDOH needs (transportation, food). Patient informed CSW of medical appointment needs (from [facility] to [facility]) and expressed interest in home delivered meals. Patient also expressed interest in adding her son, Betty [name], to her will.  3:42 PM CSW returned to patient's bedside to provide patient with requested resources Armed forces operational officer transportation number, Meals of Wheels [place] [employer], [employer] on [employer]. [place] [facility] [place]). Patient's son, Betty [name] was present at bedside. Patient consented CSW to speak in front of [place]. CSW explained provided resources to patient and Betty [name]. Patient requested further assistance with mortgage payments due to hospitalizations.  4:06 PM CSW provided patient and son, Betty [name] with [employer], Food, and Emergency Assistance resources list. CSW also provided [employer] DSS [employer]. RNCM assisted in consulting chaplain services regarding will.  Expected Discharge Plan: Home w Home Health Services Barriers to Discharge: Continued Medical Work up  Expected Discharge Plan and Services     Post Acute Care Choice: Home Health Living arrangements for the past 2 months: Single Family Home                                       Social Determinants of Health (SDOH) Interventions SDOH Screenings   Food Insecurity: No Food Insecurity (09/28/2023)  Housing: High Risk (09/28/2023)  Transportation Needs: Unmet Transportation Needs (09/28/2023)  Utilities: Not At Risk (09/28/2023)  Tobacco Use: Low Risk  (09/29/2023)    Readmission Risk Interventions      No data to display

## 2023-09-30 NOTE — Progress Notes (Signed)
   09/30/23 1100  Mobility  Activity Transferred to/from [facility]  Level of Assistance Contact guard assist, steadying assist  Assistive Device Cane  LLE Weight Bearing Per Provider Order PWB  LLE Partial Weight Bearing Percentage or Pounds  (Transfers/short distances only in post op shoe acc to order)  Activity Response Tolerated fair  Mobility Referral Yes  Mobility visit 1 Mobility  Mobility Specialist Start Time (ACUTE ONLY) 1042  Mobility Specialist Stop Time (ACUTE ONLY) 1100  Mobility Specialist Time Calculation (min) (ACUTE ONLY) 18 min   Mobility Specialist: Progress Note  Pt agreeable to mobility session - received in EOB. Required CG using Cane. C/o L foot pain  rated 3/10. Returned to EOB with all needs met - call bell within reach. Left with OT and son present.   Pt preferred female staff to assist with SB while using BSC.   Barnie Mort, BS Mobility Specialist Please contact via SecureChat or Rehab office at [phone].

## 2023-10-01 ENCOUNTER — Other Ambulatory Visit (HOSPITAL_COMMUNITY): Payer: Self-pay

## 2023-10-01 DIAGNOSIS — M86172 Other acute osteomyelitis, left ankle and foot: Secondary | ICD-10-CM | POA: Diagnosis not present

## 2023-10-01 DIAGNOSIS — R7881 Bacteremia: Secondary | ICD-10-CM | POA: Diagnosis not present

## 2023-10-01 DIAGNOSIS — M86072 Acute hematogenous osteomyelitis, left ankle and foot: Secondary | ICD-10-CM

## 2023-10-01 DIAGNOSIS — B955 Unspecified streptococcus as the cause of diseases classified elsewhere: Secondary | ICD-10-CM | POA: Diagnosis not present

## 2023-10-01 LAB — GLUCOSE, CAPILLARY
Glucose-Capillary: 206 mg/dL — ABNORMAL HIGH (ref 70–99)
Glucose-Capillary: 235 mg/dL — ABNORMAL HIGH (ref 70–99)

## 2023-10-01 LAB — SURGICAL PATHOLOGY

## 2023-10-01 MED ORDER — HYDROCODONE-ACETAMINOPHEN 5-325 MG PO TABS
1.0000 | ORAL_TABLET | Freq: Four times a day (QID) | ORAL | 0 refills | Status: DC | PRN
  Filled 2023-10-01: qty 15, 4d supply, fill #0

## 2023-10-01 MED ORDER — LINEZOLID 600 MG PO TABS: 600.0000 mg | ORAL_TABLET | Freq: Two times a day (BID) | ORAL | Status: DC

## 2023-10-01 MED ORDER — INSULIN GLARGINE-YFGN 100 UNIT/ML ~~LOC~~ SOLN
8.0000 [IU] | Freq: Every day | SUBCUTANEOUS | Status: DC
  Administered 2023-10-01: 8 [IU] via SUBCUTANEOUS
  Filled 2023-10-01: qty 0.08

## 2023-10-01 MED ORDER — LINEZOLID 600 MG PO TABS
600.0000 mg | ORAL_TABLET | Freq: Two times a day (BID) | ORAL | 0 refills | Status: AC
  Filled 2023-10-01: qty 20, 10d supply, fill #0

## 2023-10-01 NOTE — Progress Notes (Signed)
PROGRESS NOTE    Betty [name]  ZOX:[id number] DOB: Sep 06, 1956 DOA: 09/28/2023 PCP: Ailene Ravel, MD    Brief Narrative:   Betty [name] is a 67 y.o. female with past medical history significant for DM2, HTN, HLD, OSA not on CPAP, morbid obesity who presented to [facility] ED on 12/17 by direction of podiatry outpatient with left foot infection concern for osteomyelitis.  Patient was recently admitted to [facility] but left AGAINST MEDICAL ADVICE on 09/25/2023; due to being distraught about needing an amputation.  Was seen again in the outpatient podiatry office on 12/17 with concerns of left foot cellulitis, osteomyelitis to second toe with recommendations of return to the hospital for surgical management.  Of note, blood cultures previously done at [facility] positive for Streptococcus parasanguinous in both anaerobic/aerobic bottles.  In the ED, temperature 97.8 F, HR 71, RR 16, BP 139/69, SpO2 98% room air.  WBC 12.8, hemoglobin 12.7, platelet count 293.  Sodium 139, potassium 4.2, chloride 107, CO2 20, glucose 174, BUN 11, creatinine 0.59.  AST 21, ALT 24, total bilirubin 0.7.  CK1 09.  Procalcitonin less than 0.10.  TSH 2.409.  Blood cultures x 2 obtained.  MR 09/26/2023 with noted interval amputation great toe proximal distal phalanges, mild marrow edema medial aspect great toe metatarsal heads with question sinus tract/soft tissue wound distal first metatarsal suspicious for osteomyelitis, moderate second toe soft tissue swelling with ulceration distal toe soft tissues with diffuse marrow edema suspicious for osteomyelitis.  TRH was consulted for admission for further evaluation management of left foot diabetic infection with osteomyelitis   Assessment & Plan:   Left foot diabetic infection with concern for osteomyelitis Patient presenting to ED by discretion of her outpatient podiatrist for concern of left foot diabetic wound infection and  osteomyelitis.  Recently left AMA from [facility] as she was distraught about a possible need for amputation.  Recent MRI notable for osteomyelitis left foot.  Patient is afebrile with elevated WBC count of 12.8. Podiatry was consulted and patient underwent amputation second toe MPJ level and first metatarsal head resection left foot by Dr. Annamary Rummage on 09/29/2023. -- Podiatry following, appreciate assistance -- Operative culture: With few Enterococcus faecalis, abundant Staphylococcus epidermidis; susceptibilities pending -- Vancomycin, pharmacy consulted for dosing/monitoring -- Ceftriaxone 2 g IV every 24 hours -- Norco 1-2 tablets every 4 hours.  Moderate pain -- Fentanyl 12.5-50 mcg IV every 2 hours as needed severe pain -- WBAT LLE w/ postoperative shoe for short distances/transfers -- Outpatient follow-up with podiatry, Dr. Annamary Rummage 10/07/2023  Streptococcus parasanguinous septicemia Recent blood cultures 12/14 positive for Streptococcus para sanguinous in both aerobic and anaerobic bottles.  Source likely secondary to diabetic foot infection/osteomyelitis.  TTE with LVEF 60 to 65%, grade 1 diastolic dysfunction, LV with no regional wall motion abnormalities, LA mildly dilated, IVC normal in size, no reported valvular abnormalities. -- Infectious disease consulted -- Repeat blood cultures 12/17: No growth x 3 days -- Continue on vancomycin as above  Type 2 diabetes mellitus Hemoglobin A1c 7.2 09/28/2023.  Patient reports episodes of hypoglycemia.  Home regimen includes insulin NPH 36 units [place] at bedtime, regular insulin sliding scale, metformin 500 mg twice daily. -- Hold home NPH for now -- Semglee 8 units subcutaneously daily -- SSI for coverage -- CBGs before every meal/at bedtime  Essential hypertension -- Carvedilol 3.125 mg p.o. twice daily -- Holding home ramipril  Hyperlipidemia -- Crestor 10 mg p.o. daily  OSA Intolerant of  CPAP  Obesity, class III Body  mass index is 40.35 kg/m.  On Ozempic outpatient.  Complicates all facets of care   DVT prophylaxis: SCDs Start: 09/28/23 2213    Code Status: Full Code Family Communication: no family present at bedside this morning; updated son extensively at bedside yesterday  Disposition Plan:  Level of care: Med-Surg Status is: Inpatient Remains inpatient appropriate because:  pending finalized ID recommendations    Consultants:  Podiatry - signed off 12/20 Infectious disease disease  Procedures:  TTE: amputation second toe MPJ level and first metatarsal head resection left foot by Dr. Annamary Rummage on 09/29/2023.  Antimicrobials:  Vancomycin 12/17>> Ceftriaxone 12/17>>   Subjective: Patient seen examined bedside, resting calmly.  Sitting in bedside chair.  Reporting some edema to lower extremities, mild pain to operative site.  Seen by podiatry this morning, dressing changed with plan outpatient follow-up next Thursday, 10/07/2023.  Awaiting finalized culture result and ID recommendations for antibiotics on discharge.   No other specific complaints, concerns or questions at this time.  Denies headache, no dizziness, no chest pain, no palpitations, no shortness of breath, no abdominal pain, no fever/chills/night sweats, no nausea/vomiting/diarrhea, no focal weakness, no fatigue, no paresthesia.  No acute events overnight per nursing staff.  Objective: Vitals:   09/30/23 1931 10/01/23 0006 10/01/23 0413 10/01/23 0754  BP: (!) 167/56 (!) 160/56 (!) 158/63 112/60  Pulse: 73 76 83   Resp: 17 18 18 20   Temp: 97.6 F (36.4 C) 97.9 F (36.6 C) 97.7 F (36.5 C) 97.7 F (36.5 C)  TempSrc: Oral Oral Oral Oral  SpO2: 94% 100% 97% 97%  Weight:      Height:        Intake/Output Summary (Last 24 hours) at 10/01/2023 1309 Last data filed at 10/01/2023 0424 Gross per 24 hour  Intake 159.83 ml  Output --  Net 159.83 ml   Filed Weights   09/28/23 1540 09/28/23 2249 09/29/23 1506  Weight: 118  kg 113.9 kg 113.4 kg    Examination:  Physical Exam: GEN: NAD, alert and oriented x 3, obese, chronically ill in appearance, appears older than stated age HEENT: NCAT, PERRL, EOMI, sclera clear, poor dentition PULM: CTAB w/o wheezes/crackles, normal respiratory effort, on room air CV: RRR w/o M/G/R GI: abd soft, NTND, NABS, no R/G/M MSK: no peripheral edema, moves all extremities independently NEURO: CN II-XII intact, no focal deficits, sensation to light touch intact PSYCH: normal mood/affect Integumentary: Left foot with surgical dressing covered with Ace wrap in place and post op shoe on, clean/dry/intact      Data Reviewed: I have personally reviewed following labs and imaging studies  CBC: Recent Labs  Lab 09/25/23 2050 09/26/23 0047 09/26/23 0342 09/27/23 0340 09/28/23 1614 09/29/23 0448 09/30/23 0431  WBC 13.6*   < > 13.0* 10.8* 12.8* 11.1* 9.6  NEUTROABS 8.7*  --   --   --   --   --   --   HGB 13.1   < > 11.8* 12.1 12.7 12.0 10.7*  HCT 40.8   < > 37.6 38.4 38.8 36.5 32.8*  MCV 91.7   < > 93.1 92.8 89.4 88.6 89.9  PLT 298   < > 265 275 293 276 235   < > = values in this interval not displayed.   Basic Metabolic Panel: Recent Labs  Lab 09/26/23 0342 09/27/23 0340 09/28/23 1614 09/29/23 0448 09/30/23 0431  NA 138 136 139 139 141  K 3.8 4.0 4.2 3.7 3.9  CL 106 107 107 109 111  CO2 24 22 20* 21* 23  GLUCOSE 123* 145* 174* 173* 149*  BUN 14 15 11 9 9   CREATININE 0.65 0.72 0.59 0.60 0.65  CALCIUM 8.8* 9.1 9.5 8.9 8.6*  MG  --   --  1.9 1.8 1.8  PHOS  --   --  2.6 2.9  --    GFR: Estimated Creatinine Clearance: 86 mL/min (by C-G formula based on SCr of 0.65 mg/dL). Liver Function Tests: Recent Labs  Lab 09/26/23 0342 09/28/23 1614 09/29/23 0448  AST 20 21 32  ALT 23 24 29   ALKPHOS 69 69 66  BILITOT 0.9 0.7 0.7  PROT 6.8 7.3 6.8  ALBUMIN 3.6 3.6 3.4*   No results for input(s): "LIPASE", "AMYLASE" in the last 168 hours. No results for input(s):  "AMMONIA" in the last 168 hours. Coagulation Profile: No results for input(s): "INR", "PROTIME" in the last 168 hours. Cardiac Enzymes: Recent Labs  Lab 09/28/23 1614  CKTOTAL 109   BNP (last 3 results) No results for input(s): "PROBNP" in the last 8760 hours. HbA1C: Recent Labs    09/28/23 1614  HGBA1C 7.2*   CBG: Recent Labs  Lab 09/30/23 0736 09/30/23 1211 09/30/23 1632 09/30/23 2115 10/01/23 0752  GLUCAP 147* 238* 252* 225* 206*   Lipid Profile: No results for input(s): "CHOL", "HDL", "LDLCALC", "TRIG", "CHOLHDL", "LDLDIRECT" in the last 72 hours. Thyroid Function Tests: Recent Labs    09/28/23 1614  TSH 2.409   Anemia Panel: Recent Labs    09/28/23 1614 09/28/23 2233 09/29/23 0448  VITAMINB12  --   --  620  FOLATE  --   --  7.2  FERRITIN  --  122  --   TIBC  --  398  --   IRON  --  64  --   RETICCTPCT 2.1  --   --    Sepsis Labs: Recent Labs  Lab 09/25/23 2050 09/26/23 0047 09/28/23 1614  PROCALCITON  --   --  <0.10  LATICACIDVEN 1.3 0.7  --     Recent Results (from the past 240 hours)  Culture, blood (routine x 2)     Status: Abnormal   Collection Time: 09/25/23  8:50 PM   Specimen: BLOOD  Result Value Ref Range Status   Specimen Description   Final    BLOOD RIGHT ANTECUBITAL Performed at [facility], [address], [place], [place] [address]    Special Requests   Final    BOTTLES DRAWN AEROBIC AND ANAEROBIC Blood Culture results may not be optimal due to an inadequate volume of blood received in culture bottles Performed at [facility], [address], [place], [place] [address]    Culture (A)  Final    STREPTOCOCCUS PARASANGUINIS CRITICAL RESULT CALLED TO, READ BACK BY AND VERIFIED WITH: RN P.DOWD AT 1218 ON 09/28/2023 BY T.SAAD. Performed at [facility], [address], [place], [place] [address]    Report Status 09/29/2023 FINAL  Final   Organism ID, Bacteria STREPTOCOCCUS PARASANGUINIS  Final       Susceptibility   Streptococcus parasanguinis - MIC*    PENICILLIN 0.25 INTERMEDIATE Intermediate     CEFTRIAXONE <=0.12 SENSITIVE Sensitive     ERYTHROMYCIN <=0.12 SENSITIVE Sensitive     LEVOFLOXACIN 1 SENSITIVE Sensitive     VANCOMYCIN 0.5 SENSITIVE Sensitive     * STREPTOCOCCUS PARASANGUINIS  Culture, blood (routine x 2)     Status: None  Collection Time: 09/25/23  8:55 PM   Specimen: BLOOD LEFT FOREARM  Result Value Ref Range Status   Specimen Description   Final    BLOOD LEFT FOREARM Performed at [facility], [address] [place], [place] [address]    Special Requests   Final    BOTTLES DRAWN AEROBIC AND ANAEROBIC Blood Culture results may not be optimal due to an inadequate volume of blood received in culture bottles Performed at [facility], [address], [place], [place] [address]    Culture   Final    NO GROWTH 5 DAYS Performed at [facility], [address], [place], [place] [address]    Report Status 09/30/2023 FINAL  Final  Culture, blood (Routine X 2) w Reflex to ID Panel     Status: None (Preliminary result)   Collection Time: 09/28/23 10:29 PM   Specimen: BLOOD  Result Value Ref Range Status   Specimen Description BLOOD SITE NOT SPECIFIED  Final   Special Requests   Final    BOTTLES DRAWN AEROBIC AND ANAEROBIC Blood Culture results may not be optimal due to an inadequate volume of blood received in culture bottles   Culture   Final    NO GROWTH 3 DAYS Performed at [facility], [address], [place], [place] [address]    Report Status PENDING  Incomplete  Culture, blood (Routine X 2) w Reflex to ID Panel     Status: None (Preliminary result)   Collection Time: 09/28/23 10:36 PM   Specimen: BLOOD  Result Value Ref Range Status   Specimen Description BLOOD SITE NOT SPECIFIED  Final   Special Requests   Final    BOTTLES DRAWN AEROBIC ONLY Blood Culture results may not be optimal due to an inadequate  volume of blood received in culture bottles   Culture   Final    NO GROWTH 3 DAYS Performed at [facility], [address], [place], [place] [address]    Report Status PENDING  Incomplete  Aerobic/Anaerobic Culture w Gram Stain (surgical/deep wound)     Status: None (Preliminary result)   Collection Time: 09/29/23  6:33 PM   Specimen: Toe, Left; Amputation  Result Value Ref Range Status   Specimen Description WOUND  Final   Special Requests LEFT METATARSAL  Final   Gram Stain NO WBC SEEN NO ORGANISMS SEEN   Final   Culture   Final    NO GROWTH 2 DAYS NO ANAEROBES ISOLATED; CULTURE IN PROGRESS FOR 5 DAYS Performed at [facility], [address], [place], [place] [address]    Report Status PENDING  Incomplete  Aerobic/Anaerobic Culture w Gram Stain (surgical/deep wound)     Status: None (Preliminary result)   Collection Time: 09/29/23  6:34 PM   Specimen: Toe, Left; Amputation  Result Value Ref Range Status   Specimen Description BONE  Final   Special Requests LEFT TOE BONE FRAGMENT  Final   Gram Stain NO WBC SEEN RARE GRAM POSITIVE COCCI   Final   Culture   Final    FEW ENTEROCOCCUS FAECALIS ABUNDANT STAPHYLOCOCCUS EPIDERMIDIS SUSCEPTIBILITIES TO FOLLOW Performed at [facility], [address], [place], [place] [address]    Report Status PENDING  Incomplete         Radiology Studies: ECHOCARDIOGRAM COMPLETE Result Date: 09/30/2023    ECHOCARDIOGRAM REPORT   Patient Name:   LAYLANIE HOFACKER Date of Exam: 09/30/2023 Medical  Rec #:  [id number]            Height:       66.0 in Accession #:    [id number]           Weight:       250.0 lb Date of Birth:  [birth year]            BSA:          2.199 m Patient Age:    67 years             BP:           145/51 mmHg Patient Gender: F                    HR:           74 bpm. Exam Location:  Inpatient Procedure: 2D Echo, Cardiac Doppler and Color Doppler Indications:    bacteremia  History:        Patient has no  prior history of Echocardiogram examinations.                 CAD; Risk Factors:Hypertension, Dyslipidemia, Diabetes and Sleep                 Apnea.  Sonographer:    Delcie Roch RDCS Referring Phys: [id number] ANASTASSIA DOUTOVA  Sonographer Comments: Patient is obese. Image acquisition challenging due to patient body habitus. IMPRESSIONS  1. Left ventricular ejection fraction, by estimation, is 60 to 65%. The left ventricle has normal function. The left ventricle has no regional wall motion abnormalities. Left ventricular diastolic parameters are consistent with Grade I diastolic dysfunction (impaired relaxation).  2. Right ventricular systolic function is normal. The right ventricular size is normal. Tricuspid regurgitation signal is inadequate for assessing PA pressure.  3. Left atrial size was mildly dilated.  4. The mitral valve is normal in structure. No evidence of mitral valve regurgitation.  5. The aortic valve is tricuspid. Aortic valve regurgitation is not visualized.  6. The inferior vena cava is normal in size with greater than 50% respiratory variability, suggesting right atrial pressure of 3 mmHg. FINDINGS  Left Ventricle: Left ventricular ejection fraction, by estimation, is 60 to 65%. The left ventricle has normal function. The left ventricle has no regional wall motion abnormalities. The left ventricular internal cavity size was normal in size. There is  no left ventricular hypertrophy. Left ventricular diastolic parameters are consistent with Grade I diastolic dysfunction (impaired relaxation). Right Ventricle: The right ventricular size is normal. Right ventricular systolic function is normal. Tricuspid regurgitation signal is inadequate for assessing PA pressure. Left Atrium: Left atrial size was mildly dilated. Right Atrium: Right atrial size was normal in size. Pericardium: There is no evidence of pericardial effusion. Mitral Valve: The mitral valve is normal in structure. No evidence of  mitral valve regurgitation. Tricuspid Valve: Tricuspid valve regurgitation is not demonstrated. Aortic Valve: The aortic valve is tricuspid. Aortic valve regurgitation is not visualized. Pulmonic Valve: Pulmonic valve regurgitation is trivial. Aorta: The aortic root and ascending aorta are structurally normal, with no evidence of dilitation. Venous: The inferior vena cava is normal in size with greater than 50% respiratory variability, suggesting right atrial pressure of 3 mmHg. IAS/Shunts: The interatrial septum was not well visualized.  LEFT VENTRICLE PLAX 2D LVIDd:         5.30 cm   Diastology LVIDs:         2.90 cm   LV e' medial:  9.03 cm/s LV PW:         1.00 cm   LV E/e' medial:  9.7 LV IVS:        0.80 cm   LV e' lateral:   10.10 cm/s LVOT diam:     2.20 cm   LV E/e' lateral: 8.7 LV SV:         73 LV SV Index:   33 LVOT Area:     3.80 cm  RIGHT VENTRICLE             IVC RV S prime:     15.10 cm/s  IVC diam: 1.50 cm LEFT ATRIUM             Index        RIGHT ATRIUM           Index LA diam:        3.90 cm 1.77 cm/m   RA Area:     12.50 cm LA Vol (A2C):   50.6 ml 23.01 ml/m  RA Volume:   28.50 ml  12.96 ml/m LA Vol (A4C):   67.2 ml 30.56 ml/m LA Biplane Vol: 61.3 ml 27.88 ml/m  AORTIC VALVE LVOT Vmax:   90.60 cm/s LVOT Vmean:  61.800 cm/s LVOT VTI:    0.192 m  AORTA Ao Root diam: 2.60 cm Ao Asc diam:  3.10 cm MITRAL VALVE MV Area (PHT): 3.53 cm    SHUNTS MV Decel Time: 215 msec    Systemic VTI:  0.19 m MV E velocity: 87.40 cm/s  Systemic Diam: 2.20 cm MV A velocity: 99.00 cm/s MV E/A ratio:  0.88 [place] signed by Carolan Clines Signature Date/Time: 09/30/2023/1:18:27 PM    Final    DG Foot 2 Views Left Result Date: 09/29/2023 CLINICAL DATA:  Postoperative state, wound infection. EXAM: LEFT FOOT - 2 VIEW COMPARISON:  09/26/2023, 09/25/2023. FINDINGS: Examination is limited due to overlying bandage material and shoe. There has been amputation of the first and second digits and  transmetatarsal amputation of the first metatarsal. The remaining bony structures appear intact. No acute fracture or dislocation is seen. Degenerative changes are noted in the midfoot. Large calcaneal spur is noted. Enthesopathic changes are seen in the insertion site of the Achilles tendon. Vascular calcifications are noted in the soft tissues. Surgical clips are present over the distal forefoot. IMPRESSION: Status post amputation of the first and second digits and transmetatarsal amputation of the distal first metatarsal. Electronically Signed   By: Thornell Sartorius M.D.   On: 09/29/2023 23:31        Scheduled Meds:  carvedilol  3.125 mg Oral BID WC   diclofenac Sodium  4 g Topical QID   gabapentin  100 mg Oral QHS   insulin aspart  0-9 Units Subcutaneous TID WC   insulin glargine-yfgn  8 Units Subcutaneous Daily   latanoprost  1 drop Both Eyes QHS   [START ON 10/02/2023] linezolid  600 mg Oral Q12H   mirabegron ER  25 mg Oral Daily   multivitamin with minerals  1 tablet Oral Daily   nutrition supplement (JUVEN)  1 packet Oral BID BM   Osteo Bi-Flex Adv Triple St  1 tablet Oral BID   rosuvastatin  10 mg Oral Daily   Continuous Infusions:     LOS: 3 days    Time spent: 51 minutes spent on chart review, discussion with nursing staff, consultants, updating family and interview/physical exam; more than 50% of that time was  spent in counseling and/or coordination of care.    Alvira Philips [place], DO [facility] Available via Epic secure chat 7am-7pm After these hours, please refer to coverage provider listed on [email or web address] 10/01/2023, 1:09 PM

## 2023-10-01 NOTE — Progress Notes (Signed)
Mobility Specialist Progress Note;   10/01/23 1425  Mobility  Activity Ambulated with assistance in room;Ambulated with assistance in hallway  Level of Assistance Contact guard assist, steadying assist  Assistive Device Cane  Distance Ambulated (ft) 15 ft  LLE Weight Bearing Per Provider Order WBAT  Activity Response Tolerated well  Mobility Referral Yes  Mobility visit 1 Mobility  Mobility Specialist Start Time (ACUTE ONLY) 1425  Mobility Specialist Stop Time (ACUTE ONLY) 1435  Mobility Specialist Time Calculation (min) (ACUTE ONLY) 10 min   Pt agreeable to mobility. Required MinG assistance during short ambulation for safety. LLE WBAT throughout ambulation. No c/o during session, just wanting to rest. Pt returned back to bed with all needs met. Son and Strawberry in room.   Caesar Bookman Mobility Specialist Please contact via SecureChat or [employer] [phone]

## 2023-10-01 NOTE — Care Management Important Message (Signed)
Important Message  Patient Details  Name: KIT SLEIMAN MRN: [id number] Date of Birth: 1956/03/18   Important Message Given:  Yes - Medicare IM     Dorena Bodo 10/01/2023, 3:07 PM

## 2023-10-01 NOTE — Progress Notes (Signed)
Home meds, TOC meds, given to patient.

## 2023-10-01 NOTE — Progress Notes (Signed)
Dc instructions reviewed with patient. All belongings given to patient. Awaiting for rollator walker.

## 2023-10-01 NOTE — Evaluation (Addendum)
Physical Therapy Re-Evaluation Patient Details Name: MELVIN PRIMEAUX MRN: [id number] DOB: 12-27-1955 Today's Date: 10/01/2023  History of Present Illness  The pt is a 67 yo female presenting 12/17 with L foot osteomyelitis, now s/p L 2nd toe and 1st met head resection on 12/18. Pt admitted 12/14-16 with osteomyelitis of L toe, advised to have amputation but pt left AMA. PMH includes: great toe amputation 07/2023. DM c diabetic peripheral neuropathy, HTN, HLD, CAD with DES, morbid obesity, OSA, vertigo.   Clinical Impression  Pt seen for re-evaluation after amputation of Left 2nd toe and 1st met head resection, she is now "WBAT for short distances/transfer in post op shoe" by podiatry, so ambulation limited to household distances this session. The pt was able to complete short bouts of walking as well as stair training (has 8 stairs to enter her home), and was able to complete without physical assistance. She did use cane in RUE to offweight LLE in addition to post op shoe and was educated on gait with wt through heel only. Pt and son present, verbally acknowledging all education regarding LLE wt bearing, stair training, gait, use of DME throughout home, and IADL management. She is safe to return home with family support once medically stable. Would benefit from rollator for mobility, balance, and to rest L knee on seat when completing standing tasks to offweight L foot.       If plan is discharge home, recommend the following: Assist for transportation;Assistance with cooking/housework   Can travel by Nurse, mental health (4 wheels)  Recommendations for Other Services       Functional Status Assessment Patient has not had a recent decline in their functional status     Precautions / Restrictions Precautions Precautions: None Required Braces or Orthoses: Other Brace Other Brace: post-op shoe Restrictions Weight Bearing Restrictions Per Provider  Order: Yes LLE Weight Bearing Per Provider Order: Weight bearing as tolerated Other Position/Activity Restrictions: WBAT for short distances/transfer in post op shoe      Mobility  Bed Mobility Overal bed mobility: Modified Independent                  Transfers Overall transfer level: Modified independent Equipment used: Straight cane               General transfer comment: sit-stand from EOB and recliner without assist    Ambulation/Gait Ambulation/Gait assistance: Supervision Gait Distance (Feet): 12 Feet (+ 12 ft + 59ft) Assistive device: Quad cane Gait Pattern/deviations: Decreased step length - right, Decreased stride length, Decreased step length - left Gait velocity: decreased     General Gait Details: limited by MD order for "WBAT for short distances/transfer in post op shoe" but pt able to complete short, household distances with supervision and cane  Stairs Stairs: Yes Stairs assistance: Supervision Stair Management: One rail Right, Step to pattern, Forwards Number of Stairs: 4        Balance Overall balance assessment: Modified Independent Sitting-balance support: No upper extremity supported, Feet supported Sitting balance-Leahy Scale: Good     Standing balance support: Single extremity supported Standing balance-Leahy Scale: Good                               Pertinent Vitals/Pain Pain Assessment Pain Assessment: Faces Pain Score: 5  Faces Pain Scale: Hurts little more Pain Location: L foot Pain Descriptors / Indicators: Discomfort,  Aching Pain Intervention(s): Limited activity within patient's tolerance, Monitored during session, Repositioned    Home Living Family/patient expects to be discharged to:: Private residence Living Arrangements: Alone Available Help at Discharge: Family;Neighbor;Available PRN/intermittently Type of Home: House Home Access: Stairs to enter Entrance Stairs-Rails: Conservation officer, historic buildings of Steps: 4   Home Layout: One level Home Equipment: Agricultural consultant (2 wheels);Rollator (4 wheels);Cane - single point;Shower seat      Prior Function Prior Level of Function : Independent/Modified Independent             Mobility Comments: Ambulatory without AD, denies falls. Unable/not allowed? to drive, which has caused great difficulty & stress getting to/from appointments. ADLs Comments: modI baseline, wears poise pads due to urge incontinence at baseline, some difficulty with cleaning     Extremity/Trunk Assessment   Upper Extremity Assessment Upper Extremity Assessment: Defer to OT evaluation    Lower Extremity Assessment Lower Extremity Assessment: Overall WFL for tasks assessed LLE Deficits / Details: L foot in bandage, good strength in LLE, limited MMT of L foot. no change in sensation    Cervical / Trunk Assessment Cervical / Trunk Assessment: Other exceptions Cervical / Trunk Exceptions: large body habitus  Communication   Communication Communication: No apparent difficulties Cueing Techniques: Verbal cues  Cognition Arousal: Alert Behavior During Therapy: WFL for tasks assessed/performed, Anxious Overall Cognitive Status: Within Functional Limits for tasks assessed                                          General Comments General comments (skin integrity, edema, etc.): VSS on RA, pt educated on elevation, ROM exercises, and given ice packs    Exercises General Exercises - Lower Extremity Ankle Circles/Pumps: AROM, Both, 10 reps, Seated Quad Sets: AROM, Both, 10 reps, Seated Long Arc Quad: AROM, Both, 10 reps, Seated Heel Raises: AROM, Right, 10 reps, Seated   Assessment/Plan    PT Assessment Patient needs continued PT services         PT Goals (Current goals can be found in the Care Plan section)  Acute Rehab PT Goals Patient Stated Goal: to return to independence PT Goal Formulation: With patient Time For Goal  Achievement: 10/13/23 Potential to Achieve Goals: Good     AM-PAC PT "6 Clicks" Mobility  Outcome Measure Help needed turning from your back to your side while in a flat bed without using bedrails?: None Help needed moving from lying on your back to sitting on the side of a flat bed without using bedrails?: None Help needed moving to and from a bed to a chair (including a wheelchair)?: A Little Help needed standing up from a chair using your arms (e.g., wheelchair or bedside chair)?: A Little Help needed to walk in hospital room?: A Little Help needed climbing 3-5 steps with a railing? : A Little 6 Click Score: 20    End of Session Equipment Utilized During Treatment: Gait belt Activity Tolerance: Patient tolerated treatment well;Patient limited by pain Patient left: in chair;with call bell/phone within reach;with family/visitor present Nurse Communication: Mobility status PT Visit Diagnosis: Unsteadiness on feet (R26.81);Other abnormalities of gait and mobility (R26.89);Repeated falls (R29.6);Muscle weakness (generalized) (M62.81);Pain Pain - Right/Left: Left Pain - part of body: Ankle and joints of foot    Time: 1914-7829 PT Time Calculation (min) (ACUTE ONLY): 40 min   Charges:   PT Evaluation $PT Re-evaluation: 1  Re-eval PT Treatments $Gait Training: 8-22 mins $Therapeutic Activity: 8-22 mins PT General Charges $$ ACUTE PT VISIT: 1 Visit         Vickki Muff, PT, DPT   Acute Rehabilitation Department Office [phone] Secure Chat Communication Preferred  Ronnie Derby 10/01/2023, 2:17 PM

## 2023-10-01 NOTE — Progress Notes (Signed)
  Subjective:  Patient ID: Betty [name], female    DOB: Sep 20, 1956,  MRN: [id number]  Chief Complaint  Patient presents with   Wound Infection    DOS: 09/29/23 Procedure: 1.  Amputation of second toe at MPJ level, left foot 2.  First metatarsal head resection, left foot  67 y.o. female seen for post op check.  Patient seen postop day 2 status post above procedures.  She reports some burning pain at amp site. Also says she thinks she's swollen in both legs today.   Otherwise doing well.    Review of Systems: Negative except as noted in the HPI. Denies N/V/F/Ch.   Objective:   Vitals:   10/01/23 0413 10/01/23 0754  BP: (!) 158/63 112/60  Pulse: 83   Resp: 18 20  Temp: 97.7 F (36.5 C) 97.7 F (36.5 C)  SpO2: 97% 97%   Body mass index is 40.35 kg/m. Constitutional Well developed. Well nourished.  Vascular Foot warm and well perfused. Capillary refill normal to all digits.   No calf pain with palpation  Neurologic Normal speech. Oriented to person, place, and time. Epicritic sensation absent to the forefoot  Dermatologic No dehiscence, mild incision erythema likely inflammation from amp and mild hematoma underlying. Overall viable amp site.   Orthopedic: Status post second toe amputation and first metatarsal head resection left foot   Radiographs: Status post amputation of the first and second digits and transmetatarsal amputation of the distal first metatarsal.    Pathology: Pending  Micro: Left 2nd toe bone fragment- E faecalis, Staph epi  Assessment:   osteomyelitis of left second toe status post amputation.  Status post first metatarsal head resection left  Plan:  Patient was evaluated and treated and all questions answered.  POD # 2 s/p L 2nd toe amp, 1st met head resection -Progressing well, amp site healing without acute complication -XR: expected postop changes -WB Status: WBAT for short distances/transfer in post op shoe - reinforced not  overdoing weightbearing amount and prefer her to rest with leg elevated -Sutures: to remain intact. -Medications/ABX: abx per ID -   infected bone /soft tissue has been resected -Dressing reapplied. Leave intact until office follow up - All questions answered. Plan for patient to follow up with me next week Thursday afternoon. Stable for DC from my standpoint, will sign off at this time        Corinna Gab, DPM [facility] / [facility] [place]

## 2023-10-01 NOTE — Progress Notes (Signed)
   10/01/23 1518  Spiritual Encounters  Care provided to: Pt and family  Referral source Nurse (RN/NT/LPN)  Reason for visit Advance directives  OnCall Visit No  Advance Directives (For Healthcare)  Does Patient Have a Medical Advance Directive? No  Would patient like information on creating a medical advance directive? Yes (Inpatient - patient defers creating a medical advance directive at this time - Information given)   Chaplain provided AD information and paperwork.   Arlyce Dice, Chaplain Resident [phone]

## 2023-10-01 NOTE — Inpatient Diabetes Management (Signed)
Inpatient Diabetes Program Recommendations  AACE/ADA: New Consensus Statement on Inpatient Glycemic Control (2015)  Target Ranges:  Prepandial:   less than 140 mg/dL      Peak postprandial:   less than 180 mg/dL (1-2 hours)      Critically ill patients:  140 - 180 mg/dL   Lab Results  Component Value Date   GLUCAP 235 (H) 10/01/2023   HGBA1C 7.2 (H) 09/28/2023    Review of Glycemic Control  Diabetes history: type 2 Outpatient Diabetes medications: NPH 20 units in am, NPH 15 units in pm, Regular insulin 15-20 units TID with meals, Metformin 500 mg BID, Ozempic 1 mg weekly Current orders for Inpatient glycemic control: Semglee 8 units daily, Novolog 0-20 units correction scale every 4 hours  Inpatient Diabetes Program Recommendations:   Noted that CBGs elevated due to Decadron in surgery. Spoke to patient and son. Not sure when patient going home; gave her and son a sample Libre 3 CGM with instructions. She states that she has 1-2 CGM's at home, but not sure of date on them. Discussed the amount of insulin she was taking at home. She has kept very detailed records of what amount she was giving herself. She will see Dr. Lafe Garin in January. Emcouraged patient to let Dr. Lafe Garin know that she has been in the hospital.   Recommend decreasing the amount of insulin that she has been on at home due to low blood sugars at home. Will continue to monitor blood sugars while in the hospital.  Smith Mince RN BSN CDE Diabetes Coordinator Pager: [phone]  8am-5pm

## 2023-10-01 NOTE — Discharge Summary (Signed)
Physician Discharge Summary  Betty [name] YQI:[id number] DOB: 06-16-1956 DOA: 09/28/2023  PCP: Ailene Ravel, MD  Admit date: 09/28/2023 Discharge date: 10/01/2023  Admitted From: Home Disposition: Home  Recommendations for Outpatient Follow-up:  Follow up with PCP in 1-2 weeks Follow-up with podiatry, Dr. Annamary Rummage as scheduled on 10/07/2023 Continue Zyvox 600 mg p.o. twice daily for additional 10 days complete total antibiotic course for 14 days Please obtain BMP/CBC, CRP in one week   Home Health: No needs identified by PT/OT Equipment/Devices: Rollator  Discharge Condition: Stable CODE STATUS: Full code Diet recommendation: Heart healthy/consistent carbohydrate diet  History of present illness:  Betty [name] is a 67 y.o. female with past medical history significant for DM2, HTN, HLD, OSA not on CPAP, morbid obesity who presented to [facility] ED on 12/17 by direction of podiatry outpatient with left foot infection concern for osteomyelitis.  Patient was recently admitted to [facility] but left AGAINST MEDICAL ADVICE on 09/25/2023; due to being distraught about needing an amputation.  Was seen again in the outpatient podiatry office on 12/17 with concerns of left foot cellulitis, osteomyelitis to second toe with recommendations of return to the hospital for surgical management.   Of note, blood cultures previously done at [facility] positive for Streptococcus parasanguinous in both anaerobic/aerobic bottles.   In the ED, temperature 97.8 F, HR 71, RR 16, BP 139/69, SpO2 98% room air.  WBC 12.8, hemoglobin 12.7, platelet count 293.  Sodium 139, potassium 4.2, chloride 107, CO2 20, glucose 174, BUN 11, creatinine 0.59.  AST 21, ALT 24, total bilirubin 0.7.  CK1 09.  Procalcitonin less than 0.10.  TSH 2.409.  Blood cultures x 2 obtained.   MR 09/26/2023 with noted interval amputation great toe proximal distal phalanges, mild marrow edema medial  aspect great toe metatarsal heads with question sinus tract/soft tissue wound distal first metatarsal suspicious for osteomyelitis, moderate second toe soft tissue swelling with ulceration distal toe soft tissues with diffuse marrow edema suspicious for osteomyelitis.   TRH was consulted for admission for further evaluation management of left foot diabetic infection with osteomyelitis  Hospital course:  Left foot diabetic infection with concern for osteomyelitis Patient presenting to ED by discretion of her outpatient podiatrist for concern of left foot diabetic wound infection and osteomyelitis.  Recently left AMA from [facility] as she was distraught about a possible need for amputation.  Recent MRI notable for osteomyelitis left foot.  Patient is afebrile with elevated WBC count of 12.8. Podiatry was consulted and patient underwent amputation second toe MPJ level and first metatarsal head resection left foot by Dr. Annamary Rummage on 09/29/2023.  Operative culture with few Enterococcus faecalis, abundant Staphylococcus epidermidis.  Initially treated during inpatient stay with vancomycin and ceftriaxone.  Seen by infectious disease with recommendation of Zyvox 600 mg p.o. twice daily on discharge for additional 10 days for a total course of 14 days.  Remains weightbearing as tolerated left lower extremity with postoperative shoe for short distances/transfers.  Seen by PT/OT with no outpatient therapy recommended, other than rolling walker for DME.  Outpatient follow-up with podiatry, Dr. Annamary Rummage on 10/07/2023   Streptococcus parasanguinous septicemia Recent blood cultures 12/14 positive for Streptococcus para sanguinous in both aerobic and anaerobic bottles.  Source likely secondary to diabetic foot infection/osteomyelitis.  TTE with LVEF 60 to 65%, grade 1 diastolic dysfunction, LV with no regional wall motion abnormalities, LA mildly dilated, IVC normal in size, no reported valvular  abnormalities.  Treated  with IV vancomycin while inpatient and will continue Zyvox on discharge.   Type 2 diabetes mellitus Hemoglobin A1c 7.2 09/28/2023.  Patient reports episodes of hypoglycemia.  Home regimen includes insulin NPH 36 units [place] at bedtime, regular insulin sliding scale, metformin 500 mg twice daily.  Essential hypertension Continue carvedilol and ramipril   Hyperlipidemia Crestor 10 mg p.o. daily   OSA Intolerant of CPAP   Obesity, class III Body mass index is 40.35 kg/m.  On Ozempic outpatient.  Complicates all facets of care  Discharge Diagnoses:  Principal Problem:   Osteomyelitis (HCC) Active Problems:   Diabetic foot ulcer_left great toe   Acute osteomyelitis of toe, left (HCC)   Poorly controlled type 2 diabetes mellitus with circulatory disorder (HCC)   CAD, NATIVE VESSEL   Hyperlipidemia   HYPERTENSION, BENIGN   Morbid obesity with BMI of 40.0-44.9, adult (HCC)   Sleep apnea   Bacteremia    Discharge Instructions  Discharge Instructions     Call MD for:  difficulty breathing, headache or visual disturbances   Complete by: As directed    Call MD for:  extreme fatigue   Complete by: As directed    Call MD for:  persistant dizziness or light-headedness   Complete by: As directed    Call MD for:  persistant nausea and vomiting   Complete by: As directed    Call MD for:  severe uncontrolled pain   Complete by: As directed    Call MD for:  temperature >100.4   Complete by: As directed    Diet - low sodium heart healthy   Complete by: As directed    For home use only DME 4 wheeled rolling walker with seat   Complete by: As directed    Patient needs a walker to treat with the following condition: Gait abnormality   Increase activity slowly   Complete by: As directed       Allergies as of 10/01/2023       Reactions   Lyrica [pregabalin] Other (See Comments)   sleepy   Tape Rash        Medication List     TAKE these medications     acetaminophen 650 MG CR tablet Commonly known as: TYLENOL Take 650-1,300 mg by mouth every 8 (eight) hours as needed for pain.   aspirin EC 81 MG tablet Take 81 mg by mouth daily.   BD Veo Insulin Syringe U/F 31G X 15/64" 1 ML Misc Generic drug: Insulin Syringe-Needle U-100 USE 4 TIMES DAILY AS DIRECTED   beta carotene 78295 UNIT capsule Take 25,000 Units by mouth daily.   carvedilol 3.125 MG tablet Commonly known as: COREG Take 1 tablet (3.125 mg total) by mouth 2 (two) times daily with a meal.   clopidogrel 75 MG tablet Commonly known as: PLAVIX TAKE ONE TABLET BY MOUTH EVERY DAY   [employer] Magnesium Oxide 250 MG Tabs Generic drug: Magnesium Oxide -Mg Supplement Take by mouth.   estradiol 0.1 MG/GM vaginal cream Commonly known as: ESTRACE Place 0.25 Applicatorfuls vaginally 3 (three) times a week.   FreeStyle Libre 3 Sensor Misc 1 each by Does not apply route every 14 (fourteen) days.   gabapentin 100 MG capsule Commonly known as: NEURONTIN TAKE 1 CAPSULE BY MOUTH TWICE DAILY What changed: when to take this   HYDROcodone-acetaminophen 5-325 MG tablet Commonly known as: NORCO/VICODIN Take 1 tablet by mouth every 6 (six) hours as needed for moderate pain (pain score 4-6).   icosapent Ethyl  1 g capsule Commonly known as: VASCEPA Take 2 capsules (2 g total) by mouth 2 (two) times daily. What changed: how much to take   insulin NPH Human 100 UNIT/ML injection Commonly known as: NovoLIN N INJECT 8 UNITS SUBCUTANEOUSLY TWO TIMES A DAY What changed:  how much to take how to take this when to take this additional instructions   insulin regular 100 units/mL injection Commonly known as: NovoLIN R INJECT 5 units into the skin THREE TIMES daily What changed:  how to take this when to take this additional instructions   latanoprost 0.005 % ophthalmic solution Commonly known as: XALATAN Apply to eye.   linezolid 600 MG tablet Commonly known as: ZYVOX Take 1  tablet (600 mg total) by mouth every 12 (twelve) hours for 10 days. Start taking on: October 02, 2023   metFORMIN 500 MG 24 hr tablet Commonly known as: GLUCOPHAGE-XR TAKE 1 TABLET BY MOUTH 3 TIMES DAILY WITH MEALS What changed: See the new instructions.   Myrbetriq 25 MG Tb24 tablet Generic drug: mirabegron ER Take 25 mg by mouth daily.   nitroGLYCERIN 0.4 MG SL tablet Commonly known as: NITROSTAT PLACE 1 TABLET UNDER THE TONGUE EVERY 5 MINUTES AS NEEDED FOR CHEST PAIN UP TO 3 DOSES, IF SYMPTOMS PERSIST CALL 911   OSTEO BI-FLEX JOINT SHIELD PO Take 1 tablet by mouth 2 (two) times daily.   Ozempic (1 MG/DOSE) 4 MG/3ML Sopn Generic drug: Semaglutide (1 MG/DOSE) INJECT 1MG  SUBCUTANEOUSLY  ONCE A WEEK What changed: See the new instructions.   ramipril 10 MG capsule Commonly known as: ALTACE TAKE 1 CAPSULE BY MOUTH DAILY   rosuvastatin 10 MG tablet Commonly known as: CRESTOR Take 1 tablet (10 mg total) by mouth daily.   THERAWORX RELIEF EX Apply 1 application topically 3 (three) times daily as needed (pain.).   UltraTRAK PRO Test test strip Generic drug: glucose blood TEST BLOOD SUGAR 5 TIMES DAILY AS INSTRUCTED   Vitamin D3 250 MCG (10000 UT) capsule Take 10,000 Units by mouth 2 (two) times daily.   ZIMS MAX-FREEZE EX Apply 1 application topically 4 (four) times daily as needed (pain).               Durable Medical Equipment  (From admission, onward)           Start     Ordered   10/01/23 0000  For home use only DME 4 wheeled rolling walker with seat       Question:  Patient needs a walker to treat with the following condition  Answer:  Gait abnormality   10/01/23 1436            Follow-up Information     Hamrick, Maura L, MD. Schedule an appointment as soon as possible for a visit in 1 week(s).   Specialty: Family Medicine Contact information: [address] [place] [address] [phone]         Pilar Plate, DPM. Go  on 10/07/2023.   Specialty: Podiatry Contact information: [address] [place] [address] [phone]                Allergies  Allergen Reactions   Lyrica [Pregabalin] Other (See Comments)    sleepy   Tape Rash    Consultations: Podiatry - Dr. Annamary Rummage Infectious disease - Dr. Renold Don   Procedures/Studies: ECHOCARDIOGRAM COMPLETE Result Date: 09/30/2023    ECHOCARDIOGRAM REPORT   Patient Name:   [name] Date of  Exam: 09/30/2023 Medical Rec #:  [id number]            Height:       66.0 in Accession #:    [id number]           Weight:       250.0 lb Date of Birth:  01/03/1956            BSA:          2.199 m Patient Age:    67 years             BP:           145/51 mmHg Patient Gender: F                    HR:           74 bpm. Exam Location:  Inpatient Procedure: 2D Echo, Cardiac Doppler and Color Doppler Indications:    bacteremia  History:        Patient has no prior history of Echocardiogram examinations.                 CAD; Risk Factors:Hypertension, Dyslipidemia, Diabetes and Sleep                 Apnea.  Sonographer:    Delcie Roch RDCS Referring Phys: [id number] ANASTASSIA DOUTOVA  Sonographer Comments: Patient is obese. Image acquisition challenging due to patient body habitus. IMPRESSIONS  1. Left ventricular ejection fraction, by estimation, is 60 to 65%. The left ventricle has normal function. The left ventricle has no regional wall motion abnormalities. Left ventricular diastolic parameters are consistent with Grade I diastolic dysfunction (impaired relaxation).  2. Right ventricular systolic function is normal. The right ventricular size is normal. Tricuspid regurgitation signal is inadequate for assessing PA pressure.  3. Left atrial size was mildly dilated.  4. The mitral valve is normal in structure. No evidence of mitral valve regurgitation.  5. The aortic valve is tricuspid. Aortic valve regurgitation is not visualized.  6. The  inferior vena cava is normal in size with greater than 50% respiratory variability, suggesting right atrial pressure of 3 mmHg. FINDINGS  Left Ventricle: Left ventricular ejection fraction, by estimation, is 60 to 65%. The left ventricle has normal function. The left ventricle has no regional wall motion abnormalities. The left ventricular internal cavity size was normal in size. There is  no left ventricular hypertrophy. Left ventricular diastolic parameters are consistent with Grade I diastolic dysfunction (impaired relaxation). Right Ventricle: The right ventricular size is normal. Right ventricular systolic function is normal. Tricuspid regurgitation signal is inadequate for assessing PA pressure. Left Atrium: Left atrial size was mildly dilated. Right Atrium: Right atrial size was normal in size. Pericardium: There is no evidence of pericardial effusion. Mitral Valve: The mitral valve is normal in structure. No evidence of mitral valve regurgitation. Tricuspid Valve: Tricuspid valve regurgitation is not demonstrated. Aortic Valve: The aortic valve is tricuspid. Aortic valve regurgitation is not visualized. Pulmonic Valve: Pulmonic valve regurgitation is trivial. Aorta: The aortic root and ascending aorta are structurally normal, with no evidence of dilitation. Venous: The inferior vena cava is normal in size with greater than 50% respiratory variability, suggesting right atrial pressure of 3 mmHg. IAS/Shunts: The interatrial septum was not well visualized.  LEFT VENTRICLE PLAX 2D LVIDd:         5.30 cm   Diastology LVIDs:         2.90 cm   LV  e' medial:    9.03 cm/s LV PW:         1.00 cm   LV E/e' medial:  9.7 LV IVS:        0.80 cm   LV e' lateral:   10.10 cm/s LVOT diam:     2.20 cm   LV E/e' lateral: 8.7 LV SV:         73 LV SV Index:   33 LVOT Area:     3.80 cm  RIGHT VENTRICLE             IVC RV S prime:     15.10 cm/s  IVC diam: 1.50 cm LEFT ATRIUM             Index        RIGHT ATRIUM           Index  LA diam:        3.90 cm 1.77 cm/m   RA Area:     12.50 cm LA Vol (A2C):   50.6 ml 23.01 ml/m  RA Volume:   28.50 ml  12.96 ml/m LA Vol (A4C):   67.2 ml 30.56 ml/m LA Biplane Vol: 61.3 ml 27.88 ml/m  AORTIC VALVE LVOT Vmax:   90.60 cm/s LVOT Vmean:  61.800 cm/s LVOT VTI:    0.192 m  AORTA Ao Root diam: 2.60 cm Ao Asc diam:  3.10 cm MITRAL VALVE MV Area (PHT): 3.53 cm    SHUNTS MV Decel Time: 215 msec    Systemic VTI:  0.19 m MV E velocity: 87.40 cm/s  Systemic Diam: 2.20 cm MV A velocity: 99.00 cm/s MV E/A ratio:  0.88 [place] signed by Carolan Clines Signature Date/Time: 09/30/2023/1:18:27 PM    Final    DG Foot 2 Views Left Result Date: 09/29/2023 CLINICAL DATA:  Postoperative state, wound infection. EXAM: LEFT FOOT - 2 VIEW COMPARISON:  09/26/2023, 09/25/2023. FINDINGS: Examination is limited due to overlying bandage material and shoe. There has been amputation of the first and second digits and transmetatarsal amputation of the first metatarsal. The remaining bony structures appear intact. No acute fracture or dislocation is seen. Degenerative changes are noted in the midfoot. Large calcaneal spur is noted. Enthesopathic changes are seen in the insertion site of the Achilles tendon. Vascular calcifications are noted in the soft tissues. Surgical clips are present over the distal forefoot. IMPRESSION: Status post amputation of the first and second digits and transmetatarsal amputation of the distal first metatarsal. Electronically Signed   By: Thornell Sartorius M.D.   On: 09/29/2023 23:31   MR FOOT LEFT W WO CONTRAST Result Date: 09/26/2023 CLINICAL DATA:  Left second toe and left first webspace cellulitis. Evaluate for osteomyelitis. EXAM: MRI OF THE LEFT FOREFOOT WITHOUT AND WITH CONTRAST TECHNIQUE: Multiplanar, multisequence MR imaging of the left forefoot was performed both before and after administration of intravenous contrast. CONTRAST:  10mL GADAVIST GADOBUTROL 1 MMOL/ML IV SOLN  COMPARISON:  Left foot radiographs 09/25/2023, 08/27/2023, 07/31/2023; MRI left forefoot 07/30/2023 MRI left forefoot 07/30/2023. FINDINGS: Bones/Joint/Cartilage Interval amputation of the great toe proximal distal phalanges compared to 07/30/2023 MRI. There is nonspecific mild marrow edema within the medial aspect of the great toe metatarsal head with minimal decreased T1 signal in this region. Given there appears to be a possible thin sinus tract/soft tissue wound within the soft tissues bordering the dorsomedial aspect of the distal first metatarsal (axial series 5 image 21 and coronal series 7, images 9 through 13),  findings are suspicious for early osteomyelitis. This marrow edema is new from 07/30/2023 MRI. There is a diffuse marrow edema throughout the in the distal phalanges of the second toe (sagittal series 8 images 21 through 24). There is a mildly displaced oblique fracture of the proximal shaft of the proximal phalanx of the second toe (sagittal series 8 image 19 through 21). There is moderate second toe soft tissue swelling with possible mild ulceration at the distal toe soft tissues. Recommend clinical correlation. There is a 6 mm decreased T1 increased T2 signal possible ganglion extending dorsally and medially from the first tarsometatarsal joint (sagittal series 8, image 10), similar to 07/30/2023. Ligaments The Lisfranc ligament complex is intact. The metatarsophalangeal collateral ligaments appear intact. Muscles and Tendons No tendon tear is visualized. Moderate nonspecific intrinsic foot myositis. Soft tissues Moderate subcutaneous fat edema and swelling of the dorsomedial midfoot and forefoot, plantar mid to medial midfoot to forefoot, and dorsal aspect of the third through fifth toes. Possible wound medial to the great toe metatarsal head, as described above. IMPRESSION: Compared to 07/30/2023: 1. Interval amputation of the great toe proximal distal phalanges compared to 07/30/2023 MRI. There  is nonspecific mild marrow edema within the medial aspect of the great toe metatarsal head with minimal decreased T1 signal in this region. Question a possible thin sinus tract/soft tissue wound within bordering the dorsomedial aspect of the distal first metatarsal, findings are suspicious for early osteomyelitis. Recommend clinical correlation. 2. There is moderate second toe soft tissue swelling with possible mild ulceration at the distal toe soft tissues. Diffuse marrow edema throughout the distal phalanges of the second toe. Findings are suspicious for osteomyelitis of the middle and distal phalanges. 3. Mildly displaced oblique fracture of the proximal shaft of the proximal phalanx of the second toe. Electronically Signed   By: Neita Garnet M.D.   On: 09/26/2023 19:36   DG Foot 2 Views Left Result Date: 09/25/2023 CLINICAL DATA:  Left foot infection EXAM: LEFT FOOT - 2 VIEW COMPARISON:  None Available. FINDINGS: Amputation of the left great toe distal to the first metatarsal has been performed. No acute fracture or dislocation. Joint spaces are preserved. No osseous erosions or abnormal periosteal reaction. There is mild soft tissue swelling of the left forefoot and subcutaneous gas seen within the first web space and subjacent to the second proximal phalanx which may relate to aggressive infection or local trauma. No retained radiopaque foreign body. Mild midfoot degenerative arthritis. Small superior and plantar calcaneal spur. Vascular calcifications are noted. IMPRESSION: 1. Soft tissue swelling and subcutaneous gas within the first web space and subjacent to the second proximal phalanx which may relate to aggressive infection or local trauma. No radiographic evidence of osteomyelitis. Electronically Signed   By: Helyn Numbers M.D.   On: 09/25/2023 22:02     Subjective: Patient seen examined bedside, resting calmly.  Lying in bed.  Son present.  ID and podiatry signed off.  Discharging home.  No  other specific questions, concerns or complaints at this time.  Discussed with patient that needs to maintain dressing in place until follows up with podiatry next week on Thursday.  Seen by PT once again with recommendation of rollator on discharge.  Discussed leg elevation at home and to avoid any excessive ambulation/pressure on her left foot per podiatry.  No other specific questions, concerns or complaints at this time.  Denies headache, no dizziness, no chest pain, no palpitations, no fever/chills/night sweats, no nausea/vomiting/diarrhea, no focal weakness,  no abdominal pain, no fatigue, no paresthesia.  No acute events overnight per nurse staff.  Discharge Exam: Vitals:   10/01/23 0413 10/01/23 0754  BP: (!) 158/63 112/60  Pulse: 83   Resp: 18 20  Temp: 97.7 F (36.5 C) 97.7 F (36.5 C)  SpO2: 97% 97%   Vitals:   09/30/23 1931 10/01/23 0006 10/01/23 0413 10/01/23 0754  BP: (!) 167/56 (!) 160/56 (!) 158/63 112/60  Pulse: 73 76 83   Resp: 17 18 18 20   Temp: 97.6 F (36.4 C) 97.9 F (36.6 C) 97.7 F (36.5 C) 97.7 F (36.5 C)  TempSrc: Oral Oral Oral Oral  SpO2: 94% 100% 97% 97%  Weight:      Height:        Physical Exam: GEN: NAD, alert and oriented x 3, obese, chronically ill in appearance, appears older than stated age HEENT: NCAT, PERRL, EOMI, sclera clear, poor dentition PULM: CTAB w/o wheezes/crackles, normal respiratory effort, on room air CV: RRR w/o M/G/R GI: abd soft, NTND, NABS, no R/G/M MSK: no peripheral edema, moves all extremities independently NEURO: CN II-XII intact, no focal deficits, sensation to light touch intact PSYCH: normal mood/affect Integumentary: Left foot with surgical dressing covered with Ace wrap in place and post op shoe on, clean/dry/intact     The results of significant diagnostics from this hospitalization (including imaging, microbiology, ancillary and laboratory) are listed below for reference.     Microbiology: Recent Results  (from the past 240 hours)  Culture, blood (routine x 2)     Status: Abnormal   Collection Time: 09/25/23  8:50 PM   Specimen: BLOOD  Result Value Ref Range Status   Specimen Description   Final    BLOOD RIGHT ANTECUBITAL Performed at [facility], [address], [place], [place] [address]    Special Requests   Final    BOTTLES DRAWN AEROBIC AND ANAEROBIC Blood Culture results may not be optimal due to an inadequate volume of blood received in culture bottles Performed at [facility], [address], [place], [place] [address]    Culture (A)  Final    STREPTOCOCCUS PARASANGUINIS CRITICAL RESULT CALLED TO, READ BACK BY AND VERIFIED WITH: RN P.DOWD AT 1218 ON 09/28/2023 BY T.SAAD. Performed at [facility], [address], [place], [place] [address]    Report Status 09/29/2023 FINAL  Final   Organism ID, Bacteria STREPTOCOCCUS PARASANGUINIS  Final      Susceptibility   Streptococcus parasanguinis - MIC*    PENICILLIN 0.25 INTERMEDIATE Intermediate     CEFTRIAXONE <=0.12 SENSITIVE Sensitive     ERYTHROMYCIN <=0.12 SENSITIVE Sensitive     LEVOFLOXACIN 1 SENSITIVE Sensitive     VANCOMYCIN 0.5 SENSITIVE Sensitive     * STREPTOCOCCUS PARASANGUINIS  Culture, blood (routine x 2)     Status: None   Collection Time: 09/25/23  8:55 PM   Specimen: BLOOD LEFT FOREARM  Result Value Ref Range Status   Specimen Description   Final    BLOOD LEFT FOREARM Performed at [facility], [address] [place], [place] [address]    Special Requests   Final    BOTTLES DRAWN AEROBIC AND ANAEROBIC Blood Culture results may not be optimal due to an inadequate volume of blood received in culture bottles Performed at [facility], [address], [place], [place] [address]    Culture   Final    NO GROWTH 5 DAYS Performed at [facility], [address] [place], [place] [address]    Report Status 09/30/2023 FINAL  Final   Culture, blood (Routine X 2) w Reflex to ID Panel     Status: None (Preliminary result)   Collection Time: 09/28/23 10:29 PM   Specimen: BLOOD  Result Value Ref Range Status   Specimen Description BLOOD SITE NOT SPECIFIED  Final   Special Requests   Final    BOTTLES DRAWN AEROBIC AND ANAEROBIC Blood Culture results may not be optimal due to an inadequate volume of blood received in culture bottles   Culture   Final    NO GROWTH 3 DAYS Performed at [facility], [address], [place], [place] [address]    Report Status PENDING  Incomplete  Culture, blood (Routine X 2) w Reflex to ID Panel     Status: None (Preliminary result)   Collection Time: 09/28/23 10:36 PM   Specimen: BLOOD  Result Value Ref Range Status   Specimen Description BLOOD SITE NOT SPECIFIED  Final   Special Requests   Final    BOTTLES DRAWN AEROBIC ONLY Blood Culture results may not be optimal due to an inadequate volume of blood received in culture bottles   Culture   Final    NO GROWTH 3 DAYS Performed at [facility], [address], [place], [place] [address]    Report Status PENDING  Incomplete  Aerobic/Anaerobic Culture w Gram Stain (surgical/deep wound)     Status: None (Preliminary result)   Collection Time: 09/29/23  6:33 PM   Specimen: Toe, Left; Amputation  Result Value Ref Range Status   Specimen Description WOUND  Final   Special Requests LEFT METATARSAL  Final   Gram Stain NO WBC SEEN NO ORGANISMS SEEN   Final   Culture   Final    NO GROWTH 2 DAYS NO ANAEROBES ISOLATED; CULTURE IN PROGRESS FOR 5 DAYS Performed at [facility], [address] [place], [place] [address]    Report Status PENDING  Incomplete  Aerobic/Anaerobic Culture w Gram Stain (surgical/deep wound)     Status: None (Preliminary result)   Collection Time: 09/29/23  6:34 PM   Specimen: Toe, Left; Amputation  Result Value Ref Range Status   Specimen Description BONE  Final   Special Requests LEFT TOE BONE  FRAGMENT  Final   Gram Stain NO WBC SEEN RARE GRAM POSITIVE COCCI   Final   Culture   Final    FEW ENTEROCOCCUS FAECALIS ABUNDANT STAPHYLOCOCCUS EPIDERMIDIS SUSCEPTIBILITIES TO FOLLOW Performed at [facility], [address] [place], [place] [address]    Report Status PENDING  Incomplete     Labs: BNP (last 3 results) No results for input(s): "BNP" in the last 8760 hours. Basic Metabolic Panel: Recent Labs  Lab 09/26/23 0342 09/27/23 0340 09/28/23 1614 09/29/23 0448 09/30/23 0431  NA 138 136 139 139 141  K 3.8 4.0 4.2 3.7 3.9  CL 106 107 107 109 111  CO2 24 22 20* 21* 23  GLUCOSE 123* 145* 174* 173* 149*  BUN 14 15 11 9 9   CREATININE 0.65 0.72 0.59 0.60 0.65  CALCIUM 8.8* 9.1 9.5 8.9 8.6*  MG  --   --  1.9 1.8 1.8  PHOS  --   --  2.6 2.9  --    Liver Function Tests: Recent Labs  Lab 09/26/23 0342 09/28/23 1614 09/29/23 0448  AST 20 21 32  ALT 23 24 29  ALKPHOS 69 69 66  BILITOT 0.9 0.7 0.7  PROT 6.8 7.3 6.8  ALBUMIN 3.6 3.6 3.4*   No results for input(s): "LIPASE", "AMYLASE" in the last 168 hours. No results for input(s): "AMMONIA" in the last 168 hours. CBC: Recent Labs  Lab 09/25/23 2050 09/26/23 0047 09/26/23 0342 09/27/23 0340 09/28/23 1614 09/29/23 0448 09/30/23 0431  WBC 13.6*   < > 13.0* 10.8* 12.8* 11.1* 9.6  NEUTROABS 8.7*  --   --   --   --   --   --   HGB 13.1   < > 11.8* 12.1 12.7 12.0 10.7*  HCT 40.8   < > 37.6 38.4 38.8 36.5 32.8*  MCV 91.7   < > 93.1 92.8 89.4 88.6 89.9  PLT 298   < > 265 275 293 276 235   < > = values in this interval not displayed.   Cardiac Enzymes: Recent Labs  Lab 09/28/23 1614  CKTOTAL 109   BNP: Invalid input(s): "POCBNP" CBG: Recent Labs  Lab 09/30/23 1211 09/30/23 1632 09/30/23 2115 10/01/23 0752 10/01/23 1314  GLUCAP 238* 252* 225* 206* 235*   D-Dimer No results for input(s): "DDIMER" in the last 72 hours. Hgb A1c Recent Labs    09/28/23 1614  HGBA1C 7.2*   Lipid Profile No  results for input(s): "CHOL", "HDL", "LDLCALC", "TRIG", "CHOLHDL", "LDLDIRECT" in the last 72 hours. Thyroid function studies Recent Labs    09/28/23 1614  TSH 2.409   Anemia work up Recent Labs    09/28/23 1614 09/28/23 2233 09/29/23 0448  VITAMINB12  --   --  620  FOLATE  --   --  7.2  FERRITIN  --  122  --   TIBC  --  398  --   IRON  --  64  --   RETICCTPCT 2.1  --   --    Urinalysis    Component Value Date/Time   COLORURINE STRAW (A) 04/11/2018 2000   APPEARANCEUR CLEAR (A) 04/11/2018 2000   LABSPEC 1.009 04/11/2018 2000   PHURINE 8.0 04/11/2018 2000   GLUCOSEU >=500 (A) 04/11/2018 2000   HGBUR NEGATIVE 04/11/2018 2000   BILIRUBINUR NEGATIVE 04/11/2018 2000   KETONESUR 5 (A) 04/11/2018 2000   PROTEINUR 30 (A) 04/11/2018 2000   NITRITE NEGATIVE 04/11/2018 2000   LEUKOCYTESUR NEGATIVE 04/11/2018 2000   Sepsis Labs Recent Labs  Lab 09/27/23 0340 09/28/23 1614 09/29/23 0448 09/30/23 0431  WBC 10.8* 12.8* 11.1* 9.6   Microbiology Recent Results (from the past 240 hours)  Culture, blood (routine x 2)     Status: Abnormal   Collection Time: 09/25/23  8:50 PM   Specimen: BLOOD  Result Value Ref Range Status   Specimen Description   Final    BLOOD RIGHT ANTECUBITAL Performed at [facility], [address] [place], [place] [address]    Special Requests   Final    BOTTLES DRAWN AEROBIC AND ANAEROBIC Blood Culture results may not be optimal due to an inadequate volume of blood received in culture bottles Performed at [facility], [address], [place], [place] [address]    Culture (A)  Final    STREPTOCOCCUS PARASANGUINIS CRITICAL RESULT CALLED TO, READ BACK BY AND VERIFIED WITH: RN P.DOWD AT 1218 ON 09/28/2023 BY T.SAAD. Performed at [facility], [address], [place], [place] [address]    Report Status 09/29/2023 FINAL  Final   Organism ID, Bacteria STREPTOCOCCUS PARASANGUINIS  Final  Susceptibility   Streptococcus  parasanguinis - MIC*    PENICILLIN 0.25 INTERMEDIATE Intermediate     CEFTRIAXONE <=0.12 SENSITIVE Sensitive     ERYTHROMYCIN <=0.12 SENSITIVE Sensitive     LEVOFLOXACIN 1 SENSITIVE Sensitive     VANCOMYCIN 0.5 SENSITIVE Sensitive     * STREPTOCOCCUS PARASANGUINIS  Culture, blood (routine x 2)     Status: None   Collection Time: 09/25/23  8:55 PM   Specimen: BLOOD LEFT FOREARM  Result Value Ref Range Status   Specimen Description   Final    BLOOD LEFT FOREARM Performed at [facility], [address], [place], [place] [address]    Special Requests   Final    BOTTLES DRAWN AEROBIC AND ANAEROBIC Blood Culture results may not be optimal due to an inadequate volume of blood received in culture bottles Performed at [facility], [address] [place], [place] [address]    Culture   Final    NO GROWTH 5 DAYS Performed at [facility], [address], [place], [place] [address]    Report Status 09/30/2023 FINAL  Final  Culture, blood (Routine X 2) w Reflex to ID Panel     Status: None (Preliminary result)   Collection Time: 09/28/23 10:29 PM   Specimen: BLOOD  Result Value Ref Range Status   Specimen Description BLOOD SITE NOT SPECIFIED  Final   Special Requests   Final    BOTTLES DRAWN AEROBIC AND ANAEROBIC Blood Culture results may not be optimal due to an inadequate volume of blood received in culture bottles   Culture   Final    NO GROWTH 3 DAYS Performed at [facility], [address] [place], [place] [address]    Report Status PENDING  Incomplete  Culture, blood (Routine X 2) w Reflex to ID Panel     Status: None (Preliminary result)   Collection Time: 09/28/23 10:36 PM   Specimen: BLOOD  Result Value Ref Range Status   Specimen Description BLOOD SITE NOT SPECIFIED  Final   Special Requests   Final    BOTTLES DRAWN AEROBIC ONLY Blood Culture results may not be optimal due to an inadequate volume of blood received in  culture bottles   Culture   Final    NO GROWTH 3 DAYS Performed at [facility], [address], [place], [place] [address]    Report Status PENDING  Incomplete  Aerobic/Anaerobic Culture w Gram Stain (surgical/deep wound)     Status: None (Preliminary result)   Collection Time: 09/29/23  6:33 PM   Specimen: Toe, Left; Amputation  Result Value Ref Range Status   Specimen Description WOUND  Final   Special Requests LEFT METATARSAL  Final   Gram Stain NO WBC SEEN NO ORGANISMS SEEN   Final   Culture   Final    NO GROWTH 2 DAYS NO ANAEROBES ISOLATED; CULTURE IN PROGRESS FOR 5 DAYS Performed at [facility], [address] [place], [place] [address]    Report Status PENDING  Incomplete  Aerobic/Anaerobic Culture w Gram Stain (surgical/deep wound)     Status: None (Preliminary result)   Collection Time: 09/29/23  6:34 PM   Specimen: Toe, Left; Amputation  Result Value Ref Range Status   Specimen Description BONE  Final   Special Requests LEFT TOE BONE FRAGMENT  Final   Gram Stain NO WBC SEEN RARE GRAM POSITIVE COCCI   Final   Culture  Final    FEW ENTEROCOCCUS FAECALIS ABUNDANT STAPHYLOCOCCUS EPIDERMIDIS SUSCEPTIBILITIES TO FOLLOW Performed at [facility], [address] [place], [place] [address]    Report Status PENDING  Incomplete     Time coordinating discharge: Over 30 minutes  SIGNED:   Alvira Philips [place], DO  [facility] 10/01/2023, 3:01 PM

## 2023-10-01 NOTE — Plan of Care (Signed)

## 2023-10-01 NOTE — Progress Notes (Signed)
[facility]  Date of Admission:  09/28/2023       Abx: 12/17-c vanc/ceftriaxone  ASSESSMENT: Diabetic foot om 2nd toe Mri hyperintensity distal first mt  S/p disarticulation mtp joint second toe left foot and distal resection of first mt 12/18 Cx e faecalis/staph epi  From surgical procedure/finding, clear bone margin obtained    PLAN: Will transition to zyvox for 10 more days of abx until 10/11/23 Ok to discharge from id standpoint F/u podiatry as planned Can consider repeat a cbc/crp at around 7 days; do not anticipate bone marrow suppression at less than 2 weeks Id will sign off Discussed with primary team   Principal Problem:   Osteomyelitis (HCC) Active Problems:   Hyperlipidemia   HYPERTENSION, BENIGN   CAD, NATIVE VESSEL   Sleep apnea   Poorly controlled type 2 diabetes mellitus with circulatory disorder (HCC)   Diabetic foot ulcer_left great toe   Morbid obesity with BMI of 40.0-44.9, adult (HCC)   Acute osteomyelitis of toe, left (HCC)   Bacteremia   Allergies  Allergen Reactions   Lyrica [Pregabalin] Other (See Comments)    sleepy   Tape Rash    Scheduled Meds:  carvedilol  3.125 mg Oral BID WC   diclofenac Sodium  4 g Topical QID   gabapentin  100 mg Oral QHS   insulin aspart  0-9 Units Subcutaneous TID WC   insulin glargine-yfgn  8 Units Subcutaneous Daily   latanoprost  1 drop Both Eyes QHS   [START ON 10/02/2023] linezolid  600 mg Oral Q12H   mirabegron ER  25 mg Oral Daily   multivitamin with minerals  1 tablet Oral Daily   nutrition supplement (JUVEN)  1 packet Oral BID BM   Osteo Bi-Flex Adv Triple St  1 tablet Oral BID   rosuvastatin  10 mg Oral Daily   Continuous Infusions: PRN Meds:.acetaminophen **OR** acetaminophen, diphenhydrAMINE, diphenhydrAMINE-zinc acetate, fentaNYL (SUBLIMAZE) injection, HYDROcodone-acetaminophen, loperamide, ondansetron **OR** ondansetron (ZOFRAN)  IV   SUBJECTIVE: S/p 2nd toe disarticulation and distal first mt amputation Doing well Cx e faecalis/staph epi  No n/v/diarrhea  Review of Systems: ROS All other ROS was negative, except mentioned above     OBJECTIVE: Vitals:   09/30/23 1931 10/01/23 0006 10/01/23 0413 10/01/23 0754  BP: (!) 167/56 (!) 160/56 (!) 158/63 112/60  Pulse: 73 76 83   Resp: 17 18 18 20   Temp: 97.6 F (36.4 C) 97.9 F (36.6 C) 97.7 F (36.5 C) 97.7 F (36.5 C)  TempSrc: Oral Oral Oral Oral  SpO2: 94% 100% 97% 97%  Weight:      Height:       Body mass index is 40.35 kg/m.  Physical Exam General/constitutional: no distress, pleasant HEENT: Normocephalic, PER, Conj Clear, EOMI, Oropharynx clear Neck supple CV: rrr no mrg Lungs: clear to auscultation, normal respiratory effort Abd: Soft, Nontender Ext: no edema Skin: No Rash Neuro: nonfocal MSK: left foot dressing clean/dry    Lab Results Lab Results  Component Value Date   WBC 9.6 09/30/2023   HGB 10.7 (L) 09/30/2023   HCT 32.8 (L) 09/30/2023   MCV 89.9 09/30/2023   PLT 235 09/30/2023    Lab Results  Component Value Date   CREATININE 0.65 09/30/2023   BUN 9 09/30/2023   NA 141 09/30/2023   K 3.9 09/30/2023   CL 111 09/30/2023   CO2 23 09/30/2023    Lab Results  Component Value Date  ALT 29 09/29/2023   AST 32 09/29/2023   ALKPHOS 66 09/29/2023   BILITOT 0.7 09/29/2023      Microbiology: Recent Results (from the past 240 hours)  Culture, blood (routine x 2)     Status: Abnormal   Collection Time: 09/25/23  8:50 PM   Specimen: BLOOD  Result Value Ref Range Status   Specimen Description   Final    BLOOD RIGHT ANTECUBITAL Performed at [facility], [address] [place], [place] [address]    Special Requests   Final    BOTTLES DRAWN AEROBIC AND ANAEROBIC Blood Culture results may not be optimal due to an inadequate volume of blood received in culture bottles Performed at [facility], [address] [place], [place] [address]    Culture (A)  Final    STREPTOCOCCUS PARASANGUINIS CRITICAL RESULT CALLED TO, READ BACK BY AND VERIFIED WITH: RN P.DOWD AT 1218 ON 09/28/2023 BY T.SAAD. Performed at [facility], [address] [place], [place] [address]    Report Status 09/29/2023 FINAL  Final   Organism ID, Bacteria STREPTOCOCCUS PARASANGUINIS  Final      Susceptibility   Streptococcus parasanguinis - MIC*    PENICILLIN 0.25 INTERMEDIATE Intermediate     CEFTRIAXONE <=0.12 SENSITIVE Sensitive     ERYTHROMYCIN <=0.12 SENSITIVE Sensitive     LEVOFLOXACIN 1 SENSITIVE Sensitive     VANCOMYCIN 0.5 SENSITIVE Sensitive     * STREPTOCOCCUS PARASANGUINIS  Culture, blood (routine x 2)     Status: None   Collection Time: 09/25/23  8:55 PM   Specimen: BLOOD LEFT FOREARM  Result Value Ref Range Status   Specimen Description   Final    BLOOD LEFT FOREARM Performed at [facility], [address], [place], [place] [address]    Special Requests   Final    BOTTLES DRAWN AEROBIC AND ANAEROBIC Blood Culture results may not be optimal due to an inadequate volume of blood received in culture bottles Performed at [facility], [address] [place], [place] [address]    Culture   Final    NO GROWTH 5 DAYS Performed at [facility], [address] [place], [place] [address]    Report Status 09/30/2023 FINAL  Final  Culture, blood (Routine X 2) w Reflex to ID Panel     Status: None (Preliminary result)   Collection Time: 09/28/23 10:29 PM   Specimen: BLOOD  Result Value Ref Range Status   Specimen Description BLOOD SITE NOT SPECIFIED  Final   Special Requests   Final    BOTTLES DRAWN AEROBIC AND ANAEROBIC Blood Culture results may not be optimal due to an inadequate volume of blood received in culture bottles   Culture   Final    NO GROWTH 3 DAYS Performed at [facility], [address], [place], [place]  [address]    Report Status PENDING  Incomplete  Culture, blood (Routine X 2) w Reflex to ID Panel     Status: None (Preliminary result)   Collection Time: 09/28/23 10:36 PM   Specimen: BLOOD  Result Value Ref Range Status   Specimen Description BLOOD SITE NOT SPECIFIED  Final   Special Requests   Final    BOTTLES DRAWN AEROBIC ONLY Blood Culture results may not be optimal due to an inadequate volume of blood received in culture bottles   Culture   Final    NO GROWTH 3 DAYS  Performed at [facility], [address] [place], [place] [address]    Report Status PENDING  Incomplete  Aerobic/Anaerobic Culture w Gram Stain (surgical/deep wound)     Status: None (Preliminary result)   Collection Time: 09/29/23  6:33 PM   Specimen: Toe, Left; Amputation  Result Value Ref Range Status   Specimen Description WOUND  Final   Special Requests LEFT METATARSAL  Final   Gram Stain NO WBC SEEN NO ORGANISMS SEEN   Final   Culture   Final    NO GROWTH 2 DAYS NO ANAEROBES ISOLATED; CULTURE IN PROGRESS FOR 5 DAYS Performed at [facility], [address], [place], [place] [address]    Report Status PENDING  Incomplete  Aerobic/Anaerobic Culture w Gram Stain (surgical/deep wound)     Status: None (Preliminary result)   Collection Time: 09/29/23  6:34 PM   Specimen: Toe, Left; Amputation  Result Value Ref Range Status   Specimen Description BONE  Final   Special Requests LEFT TOE BONE FRAGMENT  Final   Gram Stain NO WBC SEEN RARE GRAM POSITIVE COCCI   Final   Culture   Final    FEW ENTEROCOCCUS FAECALIS ABUNDANT STAPHYLOCOCCUS EPIDERMIDIS SUSCEPTIBILITIES TO FOLLOW Performed at [facility], [address], [place], [place] [address]    Report Status PENDING  Incomplete     Serology:   Imaging: If present, new imagings (plain films, ct scans, and mri) have been personally visualized and interpreted; radiology reports have been reviewed. Decision making incorporated into the  Impression / Recommendations.   09/26/23 mri left foot wwo contrast MPRESSION: Compared to 07/30/2023:   1. Interval amputation of the great toe proximal distal phalanges compared to 07/30/2023 MRI. There is nonspecific mild marrow edema within the medial aspect of the great toe metatarsal head with minimal decreased T1 signal in this region. Question a possible thin sinus tract/soft tissue wound within bordering the dorsomedial aspect of the distal first metatarsal, findings are suspicious for early osteomyelitis. Recommend clinical correlation. 2. There is moderate second toe soft tissue swelling with possible mild ulceration at the distal toe soft tissues. Diffuse marrow edema throughout the distal phalanges of the second toe. Findings are suspicious for osteomyelitis of the middle and distal phalanges. 3. Mildly displaced oblique fracture of the proximal shaft of the proximal phalanx of the second toe.  Raymondo Band, MD [facility] [phone] pager    10/01/2023, 1:35 PM

## 2023-10-03 LAB — CULTURE, BLOOD (ROUTINE X 2)
Culture: NO GROWTH
Culture: NO GROWTH

## 2023-10-04 ENCOUNTER — Telehealth: Payer: Self-pay

## 2023-10-04 ENCOUNTER — Telehealth: Payer: Self-pay | Admitting: Podiatry

## 2023-10-04 ENCOUNTER — Other Ambulatory Visit: Payer: Self-pay | Admitting: Podiatry

## 2023-10-04 ENCOUNTER — Other Ambulatory Visit: Payer: Self-pay | Admitting: Cardiovascular Disease

## 2023-10-04 DIAGNOSIS — L089 Local infection of the skin and subcutaneous tissue, unspecified: Secondary | ICD-10-CM

## 2023-10-04 DIAGNOSIS — E11621 Type 2 diabetes mellitus with foot ulcer: Secondary | ICD-10-CM

## 2023-10-04 LAB — AEROBIC/ANAEROBIC CULTURE W GRAM STAIN (SURGICAL/DEEP WOUND)
Culture: NO GROWTH
Gram Stain: NONE SEEN
Gram Stain: NONE SEEN

## 2023-10-05 ENCOUNTER — Telehealth: Payer: Self-pay | Admitting: *Deleted

## 2023-10-05 NOTE — Progress Notes (Signed)
Complex Care Management Note Care Guide Note  10/05/2023 Name: PRAGNA SCHLINGER MRN: [id number] DOB: 1955-11-24   Complex Care Management Outreach Attempts: An unsuccessful telephone outreach was attempted today to offer the patient information about available complex care management services.  Follow Up Plan:  Additional outreach attempts will be made to offer the patient complex care management information and services.   Encounter Outcome:  No Answer  Burman Nieves, CCMA Care Coordination Care Guide Direct Dial: [phone]

## 2023-10-05 NOTE — Progress Notes (Signed)
Complex Care Management Note  Care Guide Note 10/05/2023 Name: [name] MRN: [id number] DOB: 19-Aug-1956  Betty [name] is a 67 y.o. year old female who sees Hamrick, Durward Fortes, MD for primary care. I reached out to Betty [name] by phone today to offer complex care management services.  [name] was given information about Complex Care Management services today including:   The Complex Care Management services include support from the care team which includes your Nurse Coordinator, Clinical Social Worker, or Pharmacist.  The Complex Care Management team is here to help remove barriers to the health concerns and goals most important to you. Complex Care Management services are voluntary, and the patient may decline or stop services at any time by request to their care team member.   Complex Care Management Consent Status: Patient agreed to services and verbal consent obtained.   Follow up plan:  Telephone appointment with complex care management team member scheduled for:  10/14/2022  Encounter Outcome:  Patient Scheduled  Burman Nieves, [facility] Care Coordination Care Guide Direct Dial: [phone]

## 2023-10-07 ENCOUNTER — Ambulatory Visit (INDEPENDENT_AMBULATORY_CARE_PROVIDER_SITE_OTHER): Payer: Federal, State, Local not specified - PPO | Admitting: Podiatry

## 2023-10-07 ENCOUNTER — Encounter: Payer: Self-pay | Admitting: Podiatry

## 2023-10-07 VITALS — Ht 66.0 in | Wt 250.0 lb

## 2023-10-07 DIAGNOSIS — M869 Osteomyelitis, unspecified: Secondary | ICD-10-CM

## 2023-10-07 DIAGNOSIS — Z9889 Other specified postprocedural states: Secondary | ICD-10-CM

## 2023-10-07 NOTE — Progress Notes (Signed)
  Subjective:  Patient ID: Betty [name], female    DOB: Jul 16, 1956,  MRN: [id number]  Postop check 1  DOS: 09/29/2023 Procedure: 1.  Amputation of second toe at MPJ level, left foot 2.  First metatarsal head resection, left foot  67 y.o. female seen for post op check.  Patient reports she has been doing well since discharge.  She has been walking in a postop shoe.  Has kept dressing clean dry and intact as instructed.  Taking antibiotics as instructed.  Says her pain is mostly well-controlled.  And not having much at this time.  Review of Systems: Negative except as noted in the HPI. Denies N/V/F/Ch.   Objective:  There were no vitals filed for this visit. Body mass index is 40.35 kg/m. Constitutional Well developed. Well nourished.  Vascular Foot warm and well perfused. Capillary refill normal to all digits.   No calf pain with palpation  Neurologic Normal speech. Oriented to person, place, and time. Epicritic sensation diminished to the forefoot  Dermatologic Amputation site healing very well at the second toe and first metatarsal head resection no dehiscence erythema or drainage.  No evidence of residual infection   Orthopedic: Status post second toe amputation and first metatarsal head resection   Radiographs: Deferred at this visit will consider next appointment  Pathology: Second toe osteomyelitis acute, negative for osteomyelitis first metatarsal head  Micro: Enterococcus faecalis and Staph epidermidis  Assessment:   1. Post-operative state   2. Osteomyelitis of second toe of left foot (HCC)    Plan:  Patient was evaluated and treated and all questions answered.  POD # 8 s/p left second toe amputation first metatarsal head resection -Progressing very well with healing amputation site no evidence of residual infection at this time -XR: Deferred at this visit will consider next appointment -WB Status: Weightbearing as tolerated in surgical shoe -Sutures: To  remain intact until next appointment. -Medications/ABX: Continue antibiotics until course completed -Foot redressed.  Leave dressing clean dry and intact until next appointment - Return in approximately 1 week to see Dr. Ardelle Anton due to scheduling constraints on Thursday        Corinna Gab, DPM [facility]

## 2023-10-08 ENCOUNTER — Other Ambulatory Visit: Payer: Self-pay | Admitting: *Deleted

## 2023-10-08 ENCOUNTER — Telehealth: Payer: Self-pay

## 2023-10-08 NOTE — Telephone Encounter (Signed)
J, per my records she was on the 1 mg dose.  If she does not have any GI side effects she can start at the 0.5 mg (36 clicks on the 1 mg pen).  She could do this for 2 doses and then go back to 1 mg weekly.

## 2023-10-08 NOTE — Telephone Encounter (Signed)
Patient called and left a vm stating she has been off of her Ozempic for 3 weeks because she was in the hospital and they took her off. She wants to know can she go back on it and if so what dosage should she start at since she has been off for a while.   Betty [name]

## 2023-10-11 NOTE — Telephone Encounter (Signed)
Pt has been notified and she states she will start back on Sunday. She wants to stay on her schedule.   Betty [name]

## 2023-10-14 ENCOUNTER — Ambulatory Visit: Payer: Medicare Other | Admitting: Internal Medicine

## 2023-10-14 ENCOUNTER — Encounter: Payer: Self-pay | Admitting: Internal Medicine

## 2023-10-14 VITALS — BP 122/80 | HR 77 | Ht 66.0 in | Wt 254.6 lb

## 2023-10-14 DIAGNOSIS — E1142 Type 2 diabetes mellitus with diabetic polyneuropathy: Secondary | ICD-10-CM

## 2023-10-14 DIAGNOSIS — E785 Hyperlipidemia, unspecified: Secondary | ICD-10-CM | POA: Diagnosis not present

## 2023-10-14 DIAGNOSIS — Z7985 Long-term (current) use of injectable non-insulin antidiabetic drugs: Secondary | ICD-10-CM

## 2023-10-14 DIAGNOSIS — E1159 Type 2 diabetes mellitus with other circulatory complications: Secondary | ICD-10-CM

## 2023-10-14 DIAGNOSIS — Z794 Long term (current) use of insulin: Secondary | ICD-10-CM

## 2023-10-14 DIAGNOSIS — Z4651 Encounter for fitting and adjustment of gastric lap band: Secondary | ICD-10-CM

## 2023-10-14 DIAGNOSIS — E1165 Type 2 diabetes mellitus with hyperglycemia: Secondary | ICD-10-CM

## 2023-10-14 NOTE — Progress Notes (Signed)
 Patient ID: Betty [name], female   DOB: 09/30/1956, 68 y.o.   MRN: [id number]  Betty [name] is a 68 y.o. female, returning for f/u for DM2, dx >10 years ago, insulin -dependent, uncontrolled, with complications (CAD - s/p stent, peripheral neuropathy). Last visit 4 months ago.   PCP in [place].  She is still  contemplating moving to [place]  to be closer to family, but not quite now. Her son accompanies her today and offers part of the information.   Interim history:  She also has generalized aches and pains.  Before last visit she had a diabetic foot ulcer and was seen by Dr. Silva 10/2022.  Since then, she developed cellulitis and osteomyelitis in left toes.  She had to have the left hallux amputated 07/31/2023 and the second left toe amputated 09/29/2023. She had a steroid inj in the hospital >> sugars 300.   Reviewed HbA1c levels: Lab Results  Component Value Date   HGBA1C 7.2 (H) 09/28/2023   HGBA1C 7.8 (A) 06/04/2023   HGBA1C 7.4 (A) 10/01/2022   HGBA1C 7.7 (A) 01/09/2022   HGBA1C 7.9 (A) 07/22/2021   HGBA1C 7.5 (A) 01/09/2021   HGBA1C 7.7 (A) 04/19/2020   HGBA1C 8.0 (A) 09/21/2019   HGBA1C 7.3 (A) 08/22/2018   HGBA1C 7.0 (A) 05/03/2018     Pt is on a regimen of: - Metformin  XR 500 mg 3 >> 2x a day - Victoza  1.2 mg daily >> Ozempic  0.5 >> 1 mg weekly   Breakfast  Lunch   Dinner bedtime  Insulin  N: 20 units -  - 16 units  Insulin  R: 15-20 units  15-20 units  15-20 units    -   For sugars: 71-90: take 16 units of R insulin  50-70: do not take R insulin    We tried Bydureon  2 mg weekly (added 08/2015) >> not covered Was previously on 70/30 Insulin : 70-20-30 (split in 3 b/c she was dropping her sugars overnight). On these, she was still having lows in am (40s). She was on Novolog  in the past but could not afford it.  She was also on Amaryl and Avandia. She has bladder dysfxn - cannot try SGLT2 inh.  She had diarrhea with regular metformin .   Meter:  [place]   At last visit she was not checking blood sugars after coming off the freestyle libre 3 CGM. - am: 94-144, 171, 194 >>131-270 >> 120, 152 >> 85, 101-157, 207, 213 - in the hospital - 2h after b'fast: 139-182, 219 >> 190 >> n/c >> 168 >> 130-186 - before lunch:  120-182, 213 >> 143-213>> 125, 181 >> 115-180 - 2h after lunch:  156 >> 140-189 >> n/c >> 92  >> 158-308 - before dinner: 131, 178 >> 90-120s, 200 >> 168 >> n/c >> 119-295 - bedtime: n/c >> 114, 160 >> 84, 122 >> n/c >> 140 >> n/c >> 132-243 Lowest CBG: 60 >> ... 90s >> 94 >> 85; she has hypoglycemia awareness in the 80s Highest: 205 >> 308.   No CKD, last BUN/creatinine was:  Lab Results  Component Value Date   BUN 9 09/30/2023   BUN 9 09/29/2023   Lab Results  Component Value Date   CREATININE 0.65 09/30/2023   CREATININE 0.60 09/29/2023   Lab Results  Component Value Date   MICRALBCREAT 6 06/04/2023   MICRALBCREAT 2.3 01/09/2021  On ramipril .   + HL; Last set of lipids: Lab Results  Component Value Date   CHOL 105 06/04/2023   HDL  46 (L) 06/04/2023   LDLCALC 38 06/04/2023   LDLDIRECT 48 12/11/2019   TRIG 126 06/04/2023   CHOLHDL 2.3 06/04/2023  Prev. on Pravachol  and she is not sure why she is not taking this anymore.  She was on Lipitor >> had mm cramps.   On fish oil.  Now also on Crestor  10.  She is also on Vascepa  1 g twice a day.   - eye exam: 2024: No DR reportedly, previously + DR + glaucoma, + cataracts (will have surgery). Dr Estelle.   -+ Numbness and tingling in her feet.  She was previously on Lyrica but had to stop due to somnolence.  She also had somnolence with the 300 mg Neurontin  tablets >> then 100 mg daily >> then stopped -now back on it.  She sees neurology (for  vertigo). Sees Dr Joshua (podiatrist).  Last foot exam was 10/07/2023.   She also has a history of HTN, OSA, obesity, OA - on Osteobiflex, vitamin D deficiency.   She developed severe nausea and vertigo on 04/10/2018. She  was on Reglan (per Dr. Aneita) that helped with the nausea >> now off >> she was out of work because of this for a period of time.  She contacted our office and  I advised her to stop Victoza  but her vertigo did not improve so we restarted the dose up.  Of note, brain MRI was negative for a stroke. She had a stress test with Dr. Delford as she had CP with exertion >> positive >> had a heart cath >> had a stent placed.  She was restarted on a statin afterwards.   ROS: + See HPI   I reviewed pt's medications, allergies, PMH, social hx, family hx, and changes were documented in the history of present illness. Otherwise, unchanged from my initial visit note.   Past Medical History:  Diagnosis Date   Arthritis    CAD, NATIVE VESSEL    a. S/p LAD stent in 2006 b. S/p DES to Lcx 08/2019 and patent LAD stent   DM (diabetes mellitus) (HCC)    Fibromyalgia    HYPERLIPIDEMIA-MIXED    HYPERTENSION, BENIGN    Hypoglycemia 07/29/2023   Neuropathy    Obesity    Osteoarthritis    Sleep apnea    Patient reported on 04/12/13   Vertigo    Past Surgical History:  Procedure Laterality Date   AMPUTATION TOE Left 07/31/2023   Procedure: LEFT HALLUX AMPUTATION;  Surgeon: Gershon Donnice SAUNDERS, DPM;  Location: [facility];  Service: Orthopedics/Podiatry;  Laterality: Left;   AMPUTATION TOE Left 09/29/2023   Procedure: SECOND TOE AMPUTATION FIRST METATARSAL HEAD RESECTION;  Surgeon: Malvin Marsa FALCON, DPM;  Location: [facility];  Service: Orthopedics/Podiatry;  Laterality: Left;   BREATH TEK H PYLORI N/A 03/08/2013   Procedure: BREATH TEK H PYLORI;  Surgeon: Camellia CHRISTELLA Blush, MD;  Location: [facility];  Service: General;  Laterality: N/A;   CESAREAN SECTION     CHOLECYSTECTOMY  1996   CORONARY ANGIOPLASTY WITH STENT PLACEMENT  11/2004   DES - LAD   CORONARY STENT INTERVENTION N/A 09/06/2019   Procedure: CORONARY STENT INTERVENTION;  Surgeon: Wonda Sharper, MD;  Location: [facility] INVASIVE CV LAB;  Service:  Cardiovascular;  Laterality: N/A;   CORONARY STENT PLACEMENT  11/2004   LOWER EXTREMITY ANGIOGRAPHY Left 08/02/2023   Procedure: Lower Extremity Angiography;  Surgeon: Marea Selinda RAMAN, MD;  Location: [facility] INVASIVE CV LAB;  Service: Cardiovascular;  Laterality: Left;   RIGHT/LEFT HEART  CATH AND CORONARY ANGIOGRAPHY N/A 09/06/2019   Procedure: RIGHT/LEFT HEART CATH AND CORONARY ANGIOGRAPHY;  Surgeon: Wonda Sharper, MD;  Location: [facility] INVASIVE CV LAB;  Service: Cardiovascular;  Laterality: N/A;   Social History   Socioeconomic History   Marital status: Divorced    Spouse name: Not on file   Number of children: 2   Years of education: 12   Highest education level: High school graduate  Occupational History   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   Right-handed.   Caffeine use:  She will sometimes drink up to three 20-oz bottles of soda per day.  Drinks 0.5 bottles of a 5-hour energy drink daily.   Social Drivers of Corporate Investment Banker Strain: Not on file  Food Insecurity: No Food Insecurity (09/28/2023)   Hunger Vital Sign    Worried About Running Out of Food in the Last Year: Never true    Ran Out of Food in the Last Year: Never true  Transportation Needs: Unmet Transportation Needs (09/28/2023)   PRAPARE - Administrator, Civil Service (Medical): Yes    Lack of Transportation (Non-Medical): Yes  Physical Activity: Not on file  Stress: Not on file  Social Connections: Not on file  Intimate Partner Violence: Not At Risk (09/28/2023)   Humiliation, Afraid, Rape, and Kick questionnaire    Fear of Current or Ex-Partner: No    Emotionally  Abused: No    Physically Abused: No    Sexually Abused: No   Current Outpatient Medications on File Prior to Visit  Medication Sig Dispense Refill   acetaminophen  (TYLENOL ) 650 MG CR tablet Take 650-1,300 mg by mouth every 8 (eight) hours as needed for pain.      aspirin  EC 81 MG tablet Take 81 mg by mouth daily.     beta carotene 25000 UNIT capsule Take 25,000 Units by mouth daily.       carvedilol  (COREG ) 3.125 MG tablet Take 1 tablet (3.125 mg total) by mouth 2 (two) times daily with a meal. 60 tablet 0   Cholecalciferol (VITAMIN D3) 250 MCG (10000 UT) capsule Take 10,000 Units by mouth 2 (two) times daily.     clopidogrel  (PLAVIX ) 75 MG tablet TAKE ONE TABLET BY MOUTH EVERY DAY 90 tablet 3   Continuous Blood Gluc Sensor (FREESTYLE LIBRE 3 SENSOR) MISC 1 each by Does not apply route every 14 (fourteen) days. 6 each 3   estradiol  (ESTRACE ) 0.1 MG/GM vaginal cream Place 0.25 Applicatorfuls vaginally 3 (three) times a week. 90 g 0   gabapentin  (NEURONTIN ) 100 MG capsule TAKE 1 CAPSULE BY MOUTH TWICE DAILY (Patient taking differently: Take 100 mg by mouth at bedtime.) 180 capsule 1   Homeopathic Products (THERAWORX RELIEF EX) Apply 1 application topically 3 (three) times daily as needed (pain.).     HYDROcodone -acetaminophen  (NORCO/VICODIN) 5-325 MG tablet Take 1 tablet by mouth every 6 (six) hours as needed for moderate pain (pain score 4-6). 15 tablet 0   icosapent  Ethyl (VASCEPA ) 1 g capsule Take 2 capsules (2 g total) by mouth 2 (two) times daily. (Patient taking differently: Take  1 g by mouth 2 (two) times daily.) 360 capsule 3   insulin  NPH Human (NOVOLIN  N) 100 UNIT/ML injection INJECT 8 UNITS SUBCUTANEOUSLY TWO TIMES A DAY (Patient taking differently: Inject 36 Units into the skin at bedtime.)     insulin  regular (NOVOLIN  R) 100 units/mL injection INJECT 5 units into the skin THREE TIMES daily (Patient taking differently: Inject into the skin 2 (two) times daily with a meal. Insulin  R: 22  >> 20 units with a  smaller meal and  24 >> 22 units wiith a  regular meal 71-90: take 16 units of R insulin  50-70: do not take R insulin )     Insulin  Syringe-Needle U-100 (BD VEO INSULIN  SYRINGE U/F) 31G X 15/64 1 ML MISC USE 4 TIMES DAILY AS DIRECTED 400 each 3   latanoprost  (XALATAN ) 0.005 % ophthalmic solution Apply to eye.     Magnesium Oxide -Mg Supplement ([employer] MAGNESIUM OXIDE) 250 MG TABS Take by mouth.     Menthol, Topical Analgesic, (ZIMS MAX-FREEZE EX) Apply 1 application topically 4 (four) times daily as needed (pain).     metFORMIN  (GLUCOPHAGE -XR) 500 MG 24 hr tablet TAKE 1 TABLET BY MOUTH 3 TIMES DAILY WITH MEALS (Patient taking differently: 2 (two) times daily with a meal. TAKE 1 TABLET BY MOUTH 3 TIMES DAILY WITH MEALS) 270 tablet 1   Misc Natural Products (OSTEO BI-FLEX JOINT SHIELD PO) Take 1 tablet by mouth 2 (two) times daily.     MYRBETRIQ  25 MG TB24 tablet Take 25 mg by mouth daily.     nitroGLYCERIN  (NITROSTAT ) 0.4 MG SL tablet PLACE 1 TABLET UNDER THE TONGUE EVERY 5 MINUTES AS NEEDED FOR CHEST PAIN UP TO 3 DOSES, IF SYMPTOMS PERSIST CALL 911 25 tablet 3   OZEMPIC , 1 MG/DOSE, 4 MG/3ML SOPN INJECT 1MG  SUBCUTANEOUSLY  ONCE A WEEK (Patient taking differently: Inject 1 mg into the skin once a week. INJECT 1MG  SUBCUTANEOUSLY  ONCE A WEEK ON SUNDAYS) 9 mL 3   ramipril  (ALTACE ) 10 MG capsule TAKE 1 CAPSULE BY MOUTH DAILY 90 capsule 1   rosuvastatin  (CRESTOR ) 10 MG tablet Take 1 tablet (10 mg total) by mouth daily. 90 tablet 3   ULTRATRAK PRO TEST test strip TEST BLOOD SUGAR 5 TIMES DAILY AS INSTRUCTED 450 each 1   No current facility-administered medications on file prior to visit.   Allergies  Allergen Reactions   Lyrica [Pregabalin] Other (See Comments)    sleepy   Tape Rash   Family History  Problem Relation Age of Onset   Heart disease Mother    Cancer Mother        ovarian   Arthritis Mother    Diabetes Mother    Hypertension Mother    Hypertension Father     Stroke Father    Diabetes Sister    Cancer Other    Coronary artery disease Other    Diabetes Other    PE: BP 122/80   Pulse 77   Ht 5' 6 (1.676 m)   Wt 254 lb 9.6 oz (115.5 kg)   SpO2 94%   BMI 41.09 kg/m  Wt Readings from Last 3 Encounters:  10/14/23 254 lb 9.6 oz (115.5 kg)  10/07/23 250 lb (113.4 kg)  09/29/23 250 lb (113.4 kg)    Constitutional: overweight, in NAD Eyes: no exophthalmos ENT: no masses palpated in neck, no cervical lymphadenopathy Cardiovascular: RRR, No MRG Respiratory: CTA B Musculoskeletal: no deformities other than 2 left toe amputations -left foot in boot  Skin: no rashes Neurological: no tremor with outstretched hands  ASSESSMENT: 1. DM2, insulin -dependent, uncontrolled, with complications - CAD - s/p stent 11/2004 - Dr Delford - peripheral neuropathy - DR She told me that her previous endocrinologist filled out FMLA forms 2/2 uncontrolled DM2, but I could not fill this since no major highs or lows or other issues.    2. PN   3. HL   PLAN:  1. DM2 -Patient with longstanding insulin -dependent type 2 diabetes, on basal/bolus insulin  regimen along with metformin  and GLP-1 receptor agonist with improved control after switching from Victoza  to Ozempic .  However, HbA1c at last visit was higher, at 7.8%, increased from 7.4%.  Unfortunately, she did not have any blood sugar checks with her.  She had a freestyle libre CGM in the past but she came off and was planning to restart the CGM, however, at last visit, she was not checking blood sugar with any device.  At that time, I refilled her insulin  but did not change her regimen.  We did discuss about measures to prevent the sensor from coming off and the best place to attach it. -At today's visit, she is not on the sensor.  They do have one at home but the son did not apply it yet.  I strongly advised them to do so.  However, she does check blood sugars consistently and they appear to improve after her  hospitalization for her second toe amputation 2 weeks ago.  She still has hyperglycemic spikes, possibly due to the holidays.  Latest HbA1c obtained 2 weeks ago was actually best in the last 5 years. -Currently she is off Ozempic  and we discussed about starting this.  Will start at a low dose and increase as tolerated. -Her insulin  doses were decreased in the hospital and we decided to continue this for now.  Will also continue metformin . - I advised her to: Patient Instructions  Please continue: - Metformin  XR 500 mg 2x a day  Restart: - Ozempic  0.5 mg weekly (36 clicks), then go up to 1 mg weekly   Breakfast  Lunch   Dinner bedtime  Insulin  N: 20 units -  - 16 units  Insulin  R: 15-20 units  15-20 units  15-20 units    -  For sugars: 71-90: take 8 units of R insulin  50-70: do not take R insulin   Try to get the Freestyle Libre CGM.  Please return in 4 months.   - advised to check sugars at different times of the day - 4x a day, rotating check times - advised for yearly eye exams >> she is UTD - return to clinic in 3-4 months   2. PN -Stable -She previously had somnolence with a higher dose of Neurontin , 300 mg daily, but currently on 100 mg twice a day -We discussed about possible side effects of Neurontin  and advised her to stay on the minimum dose possible   3. HL -Latest lipid panel shows that her fractions are at goal: Lab Results  Component Value Date   CHOL 105 06/04/2023   HDL 46 (L) 06/04/2023   LDLCALC 38 06/04/2023   LDLDIRECT 48 12/11/2019   TRIG 126 06/04/2023   CHOLHDL 2.3 06/04/2023  -She continues Crestor  10 mg daily and Vascepa  1 g twice a day without side effects.  Of note, she had muscle aches with Lipitor.  She then tried pravastatin  but stopped.   Lela Fendt, MD PhD [facility] Endocrinology

## 2023-10-14 NOTE — Patient Instructions (Addendum)
 Please continue: - Metformin  XR 500 mg 2x a day  Restart: - Ozempic  0.5 mg weekly (36 clicks), then go up to 1 mg weekly   Breakfast  Lunch   Dinner bedtime  Insulin  N: 20 units -  - 16 units  Insulin  R: 15-20 units  15-20 units  15-20 units    -  For sugars: 71-90: take 8 units of R insulin  50-70: do not take R insulin   Try to get the Freestyle Libre CGM.  Please return in 4 months.

## 2023-10-15 ENCOUNTER — Other Ambulatory Visit: Payer: Self-pay | Admitting: *Deleted

## 2023-10-15 ENCOUNTER — Telehealth: Payer: Self-pay | Admitting: *Deleted

## 2023-10-15 ENCOUNTER — Ambulatory Visit (INDEPENDENT_AMBULATORY_CARE_PROVIDER_SITE_OTHER): Payer: Federal, State, Local not specified - PPO | Admitting: Podiatry

## 2023-10-15 ENCOUNTER — Encounter: Payer: Self-pay | Admitting: Podiatry

## 2023-10-15 ENCOUNTER — Ambulatory Visit: Payer: Self-pay

## 2023-10-15 DIAGNOSIS — M86072 Acute hematogenous osteomyelitis, left ankle and foot: Secondary | ICD-10-CM

## 2023-10-15 MED ORDER — LINEZOLID 600 MG PO TABS: 600.0000 mg | ORAL_TABLET | Freq: Two times a day (BID) | ORAL | 0 refills | Status: AC

## 2023-10-15 NOTE — Patient Outreach (Signed)
  Care Management   Follow Up Note   10/15/2023 Name: Betty [name] MRN: [id number] DOB: 1956-08-22   Referred by: Stephanie Charlene CROME, MD Reason for referral : Care Management (RNCM: Attempt #1 Initial Outreach For Chronic Disease Management & Care Coordination Needs)   An unsuccessful telephone outreach was attempted today. The patient was referred to the case management team for assistance with care management and care coordination.   Follow Up Plan: The care management team will reach out to the patient again over the next 30 days.   Rosina Forte, BSN RN RN Care Manager  [facility]  Direct Number: [phone]

## 2023-10-15 NOTE — Progress Notes (Signed)
 Subjective: Chief Complaint  Patient presents with   Routine Post Op    RM#11 pov#2 left foot presents with redness and drainage of surgical site. Has completed antibiotics given by infectious disease provider.   68 year old female presents after the above concerns.  She said that she been feeling well and she was doing good until the lesions came off and she saw some redness today.  She does not report any fevers or chills.   DOS: 09/29/2023 1.  Amputation of second toe at MPJ level, left foot 2.  First metatarsal head resection, left foot  Objective: AAO x3, NAD DP/PT pulses palpable bilaterally, CRT less than 3 seconds Still coapted with sutures, staples intact.  On the dorsal aspect of the incision there is some localized area of edema, erythema.  There is no drainage or pus.  There is no ascending cellulitis.  There is no fluctuation, crepitation, malodor.  No pain on exam although she does have neuropathy. No pain with calf compression, swelling, warmth, erythema  Assessment: Status post left foot surgery as well as erythema  Plan: -All treatment options discussed with the patient including all alternatives, risks, complications.  -I remove the sutures today about the staples intact.  Given the  erythema which is new we will restart Zyvox .  I do want to check CBC as well as sed rate, CRP.  Order was given today. -A small amount of antibiotic ointment is applied followed by dressing.  She will keep the dressing clean, dry, intact until follow-up is not comfortable changing the bandage. -Surgical shoe, limited weightbearing -Monitor for any clinical signs or symptoms of infection and directed to call the office immediately should any occur or go to the ER. -Patient encouraged to call the office with any questions, concerns, change in symptoms.   Donnice JONELLE Fees DPM

## 2023-10-15 NOTE — Patient Instructions (Signed)
 Visit Information  Thank you for taking time to visit with me today. Please don't hesitate to contact me if I can be of assistance to you.   Following are the goals we discussed today:  Patient will contact Meals on Wheels, [employer], [employer] for assistance.   Our next appointment is by telephone on 11/16/23 at 1pm  Please call the care guide team at [phone] if you need to cancel or reschedule your appointment.   If you are experiencing a Mental Health or Behavioral Health Crisis or need someone to talk to, please call 911  Patient verbalizes understanding of instructions and care plan provided today and agrees to view in MyChart. Active MyChart status and patient understanding of how to access instructions and care plan via MyChart confirmed with patient.     Telephone follow up appointment with care management team member scheduled for: 11/16/23 1pm.   Tillman Gardener, BSW Social Worker [phone]

## 2023-10-15 NOTE — Patient Outreach (Signed)
 Care Management   Visit Note  10/15/2023 Name: Betty [name] MRN: [id number] DOB: [birth year]  Subjective: Betty [name] is a 68 y.o. year old female who is a primary care patient of Hamrick, Charlene CROME, MD. The Care Management team was consulted for assistance.      Engaged with patient spoke with patient by telephone.    Goals Addressed             This Visit's Progress    RNCM Care Management Expected Outcomes: Monitor, Self-Manage and Reduce Symptoms of: DM, HTN, HLD       Current Barriers:  Knowledge Deficits related to plan of care for management of HTN, HLD, and DMII  Care Coordination needs related to Transportation Chronic Disease Management support and education needs related to HTN, HLD, and DMII   RNCM Clinical Goal(s):  Patient will verbalize basic understanding of  HTN, HLD, and DMII disease process and self health management plan as evidenced by verbal explanation, recognizing symptoms, lifestyle modifications take all medications exactly as prescribed and will call provider for medication related questions as evidenced by compliance with all medications attend all scheduled medical appointments: with primary care provider and specialist as evidenced by keeping all scheduled appointments demonstrate Improved and Ongoing adherence to prescribed treatment plan for HTN, HLD, and DMII as evidenced by consistent medication compliance, symptom monitoring, continued lifestyle modifications continue to work with RN Care Manager to address care management and care coordination needs related to  HTN, HLD, and DMII as evidenced by adherence to CM Team Scheduled appointments work with child psychotherapist to address  related to the management of Transportation related to the management of HTN, HLD, and DMII as evidenced by review of EMR and patient or child psychotherapist report through collaboration with Medical Illustrator, provider, and care team.   Interventions: Evaluation of current  treatment plan related to  self management and patient's adherence to plan as established by provider   Diabetes Interventions:  (Status:  New goal. and Goal on track:  Yes.) Long Term Goal Assessed patient's understanding of A1c goal: <6.5% Provided education to patient about basic DM disease process. Checks blood sugar and has started to keep log. Reports that she has not started using her Herlene yet but plans to have her son set it up before he leaves to return home. RNCM advised on the importance of checking her blood sugar as recommended by endocrinology (4 times daily), obtaining her fasting sugar as well as keeping a log. She reports her fasting on 1/1: 85. Post prandial on 1/2: 151 and 1/3: 195. RNCM reviewed with patient fasting goal of <130 and post prandial <180. Reviewed medications with patient and discussed importance of medication adherence. Reports compliance with all medications Counseled on importance of regular laboratory monitoring as prescribed Discussed plans with patient for ongoing care management follow up and provided patient with direct contact information for care management team Provided patient with written educational materials related to hypo and hyperglycemia and importance of correct treatment. No hypo/hyperglycemic events reported. Reviewed scheduled/upcoming provider appointments including: Podiatry: 10-21-2023, Ophthalmology: 11-08-2023, Cardiology: 11-30-2023, Endocrinology: 02-11-2024   Advised patient, providing education and rationale, to check cbg 4 and record, calling provider for findings outside established parameters Referral made to social work team for assistance with transportation Review of patient status, including review of consultants reports, relevant laboratory and other test results, and medications completed Screening for signs and symptoms of depression related to chronic disease state  Assessed social determinant  of health barriers Lab Results   Component Value Date   HGBA1C 7.2 (H) 09/28/2023    Hyperlipidemia Interventions:  (Status:  New goal. and Goal on track:  Yes.) Long Term Goal Medication review performed; medication list updated in electronic medical record.  Provider established cholesterol goals reviewed Counseled on importance of regular laboratory monitoring as prescribed Provided HLD educational materials Reviewed role and benefits of statin for ASCVD risk reduction. Reports compliance with Crestor  Discussed strategies to manage statin-induced myalgias. Denies myalgias Reviewed importance of limiting foods high in cholesterol Reviewed exercise goals and target of 150 minutes per week. Physical exercise currently limited due to recent surgery. Reports that she is doing Theraband with home health PT Screening for signs and symptoms of depression related to chronic disease state Assessed social determinant of health barriers   Hypertension Interventions:  (Status:  New goal. and Goal on track:  Yes.) Long Term Goal Last practice recorded BP readings:  BP Readings from Last 3 Encounters:  10/14/23 122/80  10/01/23 112/60  09/27/23 (!) 146/67  Self Reported: 10-13-23 145/64 10-14-23 145/63 10-15-23 132/70 Most recent eGFR/CrCl: No results found for: EGFR  No components found for: CRCL  Evaluation of current treatment plan related to hypertension self management and patient's adherence to plan as established by provider. Regularly checks blood pressure and keeps log.  Provided education to patient re: stroke prevention, s/s of heart attack and stroke Reviewed medications with patient and discussed importance of compliance Counseled on adverse effects of illicit drug and excessive alcohol  use in patients with high blood pressure  Counseled on the importance of exercise goals with target of 150 minutes per week Discussed plans with patient for ongoing care management follow up and provided patient with direct contact  information for care management team Advised patient, providing education and rationale, to monitor blood pressure daily and record, calling PCP for findings outside established parameters Reviewed scheduled/upcoming provider appointments including: Podiatry: 10-21-2023, Ophthalmology: 11-08-2023, Cardiology: 11-30-2023, Endocrinology: 02-11-2024  Advised patient to discuss any new changes with provider Discussed complications of poorly controlled blood pressure such as heart disease, stroke, circulatory complications, vision complications, kidney impairment, sexual dysfunction Screening for signs and symptoms of depression related to chronic disease state  Assessed social determinant of health barriers  Patient Goals/Self-Care Activities: Take all medications as prescribed Attend all scheduled provider appointments Call pharmacy for medication refills 3-7 days in advance of running out of medications Attend church or other social activities Perform all self care activities independently  Perform IADL's (shopping, preparing meals, housekeeping, managing finances) independently Call provider office for new concerns or questions  Work with the social worker to address care coordination needs and will continue to work with the clinical team to address health care and disease management related needs keep appointment with eye doctor check feet daily for cuts, sores or redness enter blood sugar readings and medication or insulin  into daily log take the blood sugar log to all doctor visits check blood pressure daily write blood pressure results in a log or diary take blood pressure log to all doctor appointments take all medications exactly as prescribed  Follow Up Plan:  Telephone follow up appointment with care management team member scheduled for:  11-17-2023 at 2:00 pm              Consent to Services:  Patient was given information about care management services, agreed to services, and gave  verbal consent to participate.   Plan: Telephone follow up appointment with  care management team member scheduled for:11-17-2023 at 2:00 pm  Rosina Forte, BSN RN RN Care Manager  [facility]  Ambulatory Care Management  Direct Number: [phone]

## 2023-10-15 NOTE — Patient Instructions (Signed)
 Care Management   Initial Visit Note  10/15/2023 Name: Betty [name] MRN: [id number] DOB: 07/22/1956  Betty [name] is enrolled in a Managed Medicaid plan: No. Outreach attempt today was successful.   Subjective:   Objective:  Assessment: Betty [name] is a 68 y.o. year old female who sees Hamrick, Charlene CROME, MD for primary care. The care management team was consulted for assistance with care management and care coordination needs related to Disease Management and Care Coordination.   Review of patient status, including review of consultants reports, relevant laboratory and other test results, and collaboration with appropriate care team members and the patient's provider was performed as part of comprehensive patient evaluation and provision of care management services.    SDOH (Social Determinants of Health) screening performed today. See Care Plan Entry related to challenges with: Transportation   Goals Addressed             This Visit's Progress    RNCM Care Management Expected Outcomes: Monitor, Self-Manage and Reduce Symptoms of: DM, HTN, HLD       Current Barriers:  Knowledge Deficits related to plan of care for management of HTN, HLD, and DMII  Care Coordination needs related to Transportation Chronic Disease Management support and education needs related to HTN, HLD, and DMII   RNCM Clinical Goal(s):  Patient will verbalize basic understanding of  HTN, HLD, and DMII disease process and self health management plan as evidenced by verbal explanation, recognizing symptoms, lifestyle modifications take all medications exactly as prescribed and will call provider for medication related questions as evidenced by compliance with all medications attend all scheduled medical appointments: with primary care provider and specialist as evidenced by keeping all scheduled appointments demonstrate Improved and Ongoing adherence to prescribed treatment plan for HTN,  HLD, and DMII as evidenced by consistent medication compliance, symptom monitoring, continued lifestyle modifications continue to work with RN Care Manager to address care management and care coordination needs related to  HTN, HLD, and DMII as evidenced by adherence to CM Team Scheduled appointments work with child psychotherapist to address  related to the management of Transportation related to the management of HTN, HLD, and DMII as evidenced by review of EMR and patient or child psychotherapist report through collaboration with Medical Illustrator, provider, and care team.   Interventions: Evaluation of current treatment plan related to  self management and patient's adherence to plan as established by provider   Diabetes Interventions:  (Status:  New goal. and Goal on track:  Yes.) Long Term Goal Assessed patient's understanding of A1c goal: <6.5% Provided education to patient about basic DM disease process. Checks blood sugar and has started to keep log. Reports that she has not started using her Herlene yet but plans to have her son set it up before he leaves to return home. RNCM advised on the importance of checking her blood sugar as recommended by endocrinology (4 times daily), obtaining her fasting sugar as well as keeping a log. She reports her fasting on 1/1: 85. Post prandial on 1/2: 151 and 1/3: 195. RNCM reviewed with patient fasting goal of <130 and post prandial <180. Reviewed medications with patient and discussed importance of medication adherence. Reports compliance with all medications Counseled on importance of regular laboratory monitoring as prescribed Discussed plans with patient for ongoing care management follow up and provided patient with direct contact information for care management team Provided patient with written educational materials related to hypo and hyperglycemia and importance of  correct treatment. No hypo/hyperglycemic events reported. Reviewed scheduled/upcoming provider  appointments including: Podiatry: 10-21-2023, Ophthalmology: 11-08-2023, Cardiology: 11-30-2023, Endocrinology: 02-11-2024   Advised patient, providing education and rationale, to check cbg 4 and record, calling provider for findings outside established parameters Referral made to social work team for assistance with transportation Review of patient status, including review of consultants reports, relevant laboratory and other test results, and medications completed Screening for signs and symptoms of depression related to chronic disease state  Assessed social determinant of health barriers Lab Results  Component Value Date   HGBA1C 7.2 (H) 09/28/2023    Hyperlipidemia Interventions:  (Status:  New goal. and Goal on track:  Yes.) Long Term Goal Medication review performed; medication list updated in electronic medical record.  Provider established cholesterol goals reviewed Counseled on importance of regular laboratory monitoring as prescribed Provided HLD educational materials Reviewed role and benefits of statin for ASCVD risk reduction. Reports compliance with Crestor  Discussed strategies to manage statin-induced myalgias. Denies myalgias Reviewed importance of limiting foods high in cholesterol Reviewed exercise goals and target of 150 minutes per week. Physical exercise currently limited due to recent surgery. Reports that she is doing Theraband with home health PT Screening for signs and symptoms of depression related to chronic disease state Assessed social determinant of health barriers   Hypertension Interventions:  (Status:  New goal. and Goal on track:  Yes.) Long Term Goal Last practice recorded BP readings:  BP Readings from Last 3 Encounters:  10/14/23 122/80  10/01/23 112/60  09/27/23 (!) 146/67  Self Reported: 10-13-23 145/64 10-14-23 145/63 10-15-23 132/70 Most recent eGFR/CrCl: No results found for: EGFR  No components found for: CRCL  Evaluation of current treatment plan  related to hypertension self management and patient's adherence to plan as established by provider. Regularly checks blood pressure and keeps log.  Provided education to patient re: stroke prevention, s/s of heart attack and stroke Reviewed medications with patient and discussed importance of compliance Counseled on adverse effects of illicit drug and excessive alcohol  use in patients with high blood pressure  Counseled on the importance of exercise goals with target of 150 minutes per week Discussed plans with patient for ongoing care management follow up and provided patient with direct contact information for care management team Advised patient, providing education and rationale, to monitor blood pressure daily and record, calling PCP for findings outside established parameters Reviewed scheduled/upcoming provider appointments including: Podiatry: 10-21-2023, Ophthalmology: 11-08-2023, Cardiology: 11-30-2023, Endocrinology: 02-11-2024  Advised patient to discuss any new changes with provider Discussed complications of poorly controlled blood pressure such as heart disease, stroke, circulatory complications, vision complications, kidney impairment, sexual dysfunction Screening for signs and symptoms of depression related to chronic disease state  Assessed social determinant of health barriers  Patient Goals/Self-Care Activities: Take all medications as prescribed Attend all scheduled provider appointments Call pharmacy for medication refills 3-7 days in advance of running out of medications Attend church or other social activities Perform all self care activities independently  Perform IADL's (shopping, preparing meals, housekeeping, managing finances) independently Call provider office for new concerns or questions  Work with the social worker to address care coordination needs and will continue to work with the clinical team to address health care and disease management related needs keep  appointment with eye doctor check feet daily for cuts, sores or redness enter blood sugar readings and medication or insulin  into daily log take the blood sugar log to all doctor visits check blood pressure daily write blood  pressure results in a log or diary take blood pressure log to all doctor appointments take all medications exactly as prescribed  Follow Up Plan:  Telephone follow up appointment with care management team member scheduled for:  11-17-2023 at 2:00 pm           Follow up plan:  Telephone follow up appointment with care management team member scheduled for:11-17-2023 at 2:00 pm  Ms. Finken was given information about Care Management services today including:  Care Management services include personalized support from designated clinical staff supervised by a physician, including individualized plan of care and coordination with other care providers 24/7 contact phone numbers for assistance for urgent and routine care needs. The patient may stop CCM services at any time (effective at the end of the month) by phone call to the office staff.  Patient agreed to services and verbal consent obtained.  Rosina Forte, BSN RN RN Care Manager  [place]  Ambulatory Care Management  Direct Number: [phone]

## 2023-10-15 NOTE — Patient Outreach (Signed)
  Care Coordination   Initial Visit Note   10/15/2023 Name: Betty [name] MRN: [id number] DOB: 07-14-1956  Betty [name] is a 68 y.o. year old female who sees Hamrick, Charlene CROME, MD for primary care. I spoke with  Betty [name] by phone today and her son Mallissa is present.  What matters to the patients health and wellness today?  Patient needs food resources, transportation, and diaper assistance. SW directed patient/son to [employer] for diaper assistance.  Patient will also contact [employer] to see if her plan offers meals after discharge from the hospital and any over the counter discount.     Goals Addressed             This Visit's Progress    Care Coordination Activities       Interventions Today    Flowsheet Row Most Recent Value  Chronic Disease   Chronic disease during today's visit Diabetes  General Interventions   General Interventions Discussed/Reviewed General Interventions Discussed, General Interventions Reviewed, [employer] reports son has purchased food. Pt orders delivered food from [employer] & uses microwaved meals sometimes.SW provided number to [employer] for waiting list.Son pays for Gisele to medical appts. [employer] application completed and will forward to md.]              SDOH assessments and interventions completed:  No     Care Coordination Interventions:  Yes, provided   Follow up plan: Follow up call scheduled for 11/16/23 at 1pm.    Encounter Outcome:  Patient Visit Completed

## 2023-10-19 ENCOUNTER — Encounter: Payer: Self-pay | Admitting: Internal Medicine

## 2023-10-20 ENCOUNTER — Telehealth: Payer: Medicare Other | Admitting: Family Medicine

## 2023-10-20 DIAGNOSIS — J069 Acute upper respiratory infection, unspecified: Secondary | ICD-10-CM

## 2023-10-20 MED ORDER — FLUTICASONE PROPIONATE 50 MCG/ACT NA SUSP: 2.0000 | Freq: Every day | NASAL | 0 refills | Status: AC

## 2023-10-20 MED ORDER — FREESTYLE LIBRE 3 SENSOR MISC: 1.0000 | 3 refills | Status: AC

## 2023-10-20 MED ORDER — BENZONATATE 100 MG PO CAPS: 100.0000 mg | ORAL_CAPSULE | Freq: Three times a day (TID) | ORAL | 0 refills | Status: AC | PRN

## 2023-10-20 MED ORDER — FREESTYLE LIBRE 3 SENSOR MISC: 1.0000 | 3 refills | Status: DC

## 2023-10-20 NOTE — Progress Notes (Signed)
 Virtual Visit Consent   Betty [name], you are scheduled for a virtual visit with a [facility] provider today. Just as with appointments in the office, your consent must be obtained to participate. Your consent will be active for this visit and any virtual visit you may have with one of our providers in the next 365 days. If you have a MyChart account, a copy of this consent can be sent to you electronically.  As this is a virtual visit, video technology does not allow for your provider to perform a traditional examination. This may limit your provider's ability to fully assess your condition. If your provider identifies any concerns that need to be evaluated in person or the need to arrange testing (such as labs, EKG, etc.), we will make arrangements to do so. Although advances in technology are sophisticated, we cannot ensure that it will always work on either your end or our end. If the connection with a video visit is poor, the visit may have to be switched to a telephone visit. With either a video or telephone visit, we are not always able to ensure that we have a secure connection.  By engaging in this virtual visit, you consent to the provision of healthcare and authorize for your insurance to be billed (if applicable) for the services provided during this visit. Depending on your insurance coverage, you may receive a charge related to this service.  I need to obtain your verbal consent now. Are you willing to proceed with your visit today? Betty [name] has provided verbal consent on 10/20/2023 for a virtual visit (video or telephone). [name], NP  Date: 10/20/2023 2:46 PM  Virtual Visit via Video Note   I, [name], connected with  Betty [name]  ([id number], Jun 24, 1956) on 10/20/23 at  2:45 PM EST by a video-enabled telemedicine application and verified that I am speaking with the correct person using two identifiers.  Location: Patient: Virtual Visit  Location Patient: Home Provider: Virtual Visit Location Provider: Home Office   I discussed the limitations of evaluation and management by telemedicine and the availability of in person appointments. The patient expressed understanding and agreed to proceed.    History of Present Illness: Betty [name] is a 68 y.o. who identifies as a female who was assigned female at birth, and is being seen today for cough congestion  Onset was Monday- with congestion- nasal drainage (PND) coughing up a lot- clear Associated symptoms are hoarseness, body aches  Modifying factors are allergy medication Denies chest pain, shortness of breath, fevers, chills  Exposure to sick contacts- known- son was around and sick COVID test: no  Vaccines: not up to date  Problems:  Patient Active Problem List   Diagnosis Date Noted   Osteomyelitis (HCC) 09/28/2023   Bacteremia 09/28/2023   Cellulitis of toe of left foot 09/26/2023   Osteomyelitis of second toe of left foot (HCC) 09/25/2023   Osteomyelitis of great toe of left foot (HCC) 08/01/2023   Acute osteomyelitis of toe, left (HCC) 07/30/2023   Diabetic foot ulcer_left great toe 07/29/2023   Morbid obesity with BMI of 40.0-44.9, adult (HCC) 07/29/2023   Abnormal nuclear cardiac imaging test 09/06/2019   Cerebellar ataxia in diseases classified elsewhere (HCC) 08/06/2018   Gait abnormality 08/02/2018   Diabetic peripheral neuropathy (HCC) 06/17/2017   Poorly controlled type 2 diabetes mellitus with circulatory disorder (HCC) 03/06/2016   Osteoarthritis    Obesity    Sleep apnea  Hyperlipidemia 07/31/2009   HYPERTENSION, BENIGN 07/31/2009   CAD, NATIVE VESSEL 07/31/2009    Allergies:  Allergies  Allergen Reactions   Lyrica [Pregabalin] Other (See Comments)    sleepy   Tape Rash   Medications:  Current Outpatient Medications:    acetaminophen  (TYLENOL ) 650 MG CR tablet, Take 650-1,300 mg by mouth every 8 (eight) hours as needed for  pain. , Disp: , Rfl:    aspirin  EC 81 MG tablet, Take 81 mg by mouth daily., Disp: , Rfl:    beta carotene 25000 UNIT capsule, Take 25,000 Units by mouth daily.  , Disp: , Rfl:    carvedilol  (COREG ) 3.125 MG tablet, Take 1 tablet (3.125 mg total) by mouth 2 (two) times daily with a meal., Disp: 60 tablet, Rfl: 0   Cholecalciferol (VITAMIN D3) 250 MCG (10000 UT) capsule, Take 10,000 Units by mouth 2 (two) times daily., Disp: , Rfl:    clopidogrel  (PLAVIX ) 75 MG tablet, TAKE ONE TABLET BY MOUTH EVERY DAY, Disp: 90 tablet, Rfl: 3   Continuous Glucose Sensor (FREESTYLE LIBRE 3 SENSOR) MISC, 1 each by Does not apply route every 14 (fourteen) days., Disp: 6 each, Rfl: 3   gabapentin  (NEURONTIN ) 100 MG capsule, TAKE 1 CAPSULE BY MOUTH TWICE DAILY (Patient taking differently: Take 100 mg by mouth at bedtime.), Disp: 180 capsule, Rfl: 1   Homeopathic Products (THERAWORX RELIEF EX), Apply 1 application topically 3 (three) times daily as needed (pain.)., Disp: , Rfl:    icosapent  Ethyl (VASCEPA ) 1 g capsule, Take 2 capsules (2 g total) by mouth 2 (two) times daily. (Patient taking differently: Take 1 g by mouth 2 (two) times daily.), Disp: 360 capsule, Rfl: 3   insulin  NPH Human (NOVOLIN  N) 100 UNIT/ML injection, INJECT 8 UNITS SUBCUTANEOUSLY TWO TIMES A DAY (Patient taking differently: Inject 36 Units into the skin at bedtime.), Disp: , Rfl:    insulin  regular (NOVOLIN  R) 100 units/mL injection, INJECT 5 units into the skin THREE TIMES daily (Patient taking differently: Inject into the skin 2 (two) times daily with a meal. Insulin  R: 22 >> 20 units with a  smaller meal and  24 >> 22 units wiith a  regular meal 71-90: take 16 units of R insulin  50-70: do not take R insulin ), Disp: , Rfl:    Insulin  Syringe-Needle U-100 (BD VEO INSULIN  SYRINGE U/F) 31G X 15/64 1 ML MISC, USE 4 TIMES DAILY AS DIRECTED, Disp: 400 each, Rfl: 3   latanoprost  (XALATAN ) 0.005 % ophthalmic solution, Apply to eye., Disp: , Rfl:     linezolid  (ZYVOX ) 600 MG tablet, Take 1 tablet (600 mg total) by mouth 2 (two) times daily., Disp: 14 tablet, Rfl: 0   Magnesium Oxide -Mg Supplement ([employer] MAGNESIUM OXIDE) 250 MG TABS, Take by mouth., Disp: , Rfl:    Menthol, Topical Analgesic, (ZIMS MAX-FREEZE EX), Apply 1 application topically 4 (four) times daily as needed (pain)., Disp: , Rfl:    metFORMIN  (GLUCOPHAGE -XR) 500 MG 24 hr tablet, TAKE 1 TABLET BY MOUTH 3 TIMES DAILY WITH MEALS (Patient taking differently: 2 (two) times daily with a meal. TAKE 1 TABLET BY MOUTH 3 TIMES DAILY WITH MEALS), Disp: 270 tablet, Rfl: 1   Misc Natural Products (OSTEO BI-FLEX JOINT SHIELD PO), Take 1 tablet by mouth 2 (two) times daily., Disp: , Rfl:    MYRBETRIQ  25 MG TB24 tablet, Take 25 mg by mouth daily., Disp: , Rfl:    nitroGLYCERIN  (NITROSTAT ) 0.4 MG SL tablet, PLACE 1 TABLET UNDER  THE TONGUE EVERY 5 MINUTES AS NEEDED FOR CHEST PAIN UP TO 3 DOSES, IF SYMPTOMS PERSIST CALL 911, Disp: 25 tablet, Rfl: 3   OZEMPIC , 1 MG/DOSE, 4 MG/3ML SOPN, INJECT 1MG  SUBCUTANEOUSLY  ONCE A WEEK (Patient taking differently: Inject 1 mg into the skin once a week. INJECT 1MG  SUBCUTANEOUSLY  ONCE A WEEK ON SUNDAYS), Disp: 9 mL, Rfl: 3   ramipril  (ALTACE ) 10 MG capsule, TAKE 1 CAPSULE BY MOUTH DAILY, Disp: 90 capsule, Rfl: 1   rosuvastatin  (CRESTOR ) 10 MG tablet, Take 1 tablet (10 mg total) by mouth daily., Disp: 90 tablet, Rfl: 3   ULTRATRAK PRO TEST test strip, TEST BLOOD SUGAR 5 TIMES DAILY AS INSTRUCTED, Disp: 450 each, Rfl: 1  Observations/Objective: Patient is well-developed, well-nourished in no acute distress.  Resting comfortably  at home.  Head is normocephalic, atraumatic.  No labored breathing.  Speech is clear and coherent with logical content.  Patient is alert and oriented at baseline.    Assessment and Plan:  1. Viral URI wi - benzonatate  (TESSALON ) 100 MG capsule; Take 1 capsule (100 mg total) by mouth 3 (three) times daily as needed for cough.   Dispense: 30 capsule; Refill: 0 - fluticasone  (FLONASE ) 50 MCG/ACT nasal spray; Place 2 sprays into both nostrils daily.  Dispense: 16 g; Refill: 0  URI recommendations: -Take medications as discussed, move foot appt, take covid/flu test to be safe - Increased rest - Increasing Fluids - Acetaminophen  / ibuprofen as needed for fever/pain.  - Salt water  gargling, chloraseptic spray and throat lozenges - Mucinex-plain if mucus is present and increasing.  - Saline nasal spray if congestion or if nasal passages feel dry. - Humidifying the air.   Reviewed side effects, risks and benefits of medication.    Patient acknowledged agreement and understanding of the plan.   Past Medical, Surgical, Social History, Allergies, and Medications have been Reviewed.    Follow Up Instructions: I discussed the assessment and treatment plan with the patient. The patient was provided an opportunity to ask questions and all were answered. The patient agreed with the plan and demonstrated an understanding of the instructions.  A copy of instructions were sent to the patient via MyChart unless otherwise noted below.    The patient was advised to call back or seek an in-person evaluation if the symptoms worsen or if the condition fails to improve as anticipated.    [name], NP

## 2023-10-20 NOTE — Patient Instructions (Signed)
 Betty [name], thank you for joining Betty CHRISTELLA Barefoot, NP for today's virtual visit.  While this provider is not your primary care provider (PCP), if your PCP is located in our provider database this encounter information will be shared with them immediately following your visit.   A [facility] MyChart account gives you access to today's visit and all your visits, tests, and labs performed at [facility]  click here if you don't have a [facility] MyChart account or go to [email or web address]  Consent: (Patient) Betty [name] provided verbal consent for this virtual visit at the beginning of the encounter.  Current Medications:  Current Outpatient Medications:    benzonatate  (TESSALON ) 100 MG capsule, Take 1 capsule (100 mg total) by mouth 3 (three) times daily as needed for cough., Disp: 30 capsule, Rfl: 0   fluticasone  (FLONASE ) 50 MCG/ACT nasal spray, Place 2 sprays into both nostrils daily., Disp: 16 g, Rfl: 0   acetaminophen  (TYLENOL ) 650 MG CR tablet, Take 650-1,300 mg by mouth every 8 (eight) hours as needed for pain. , Disp: , Rfl:    aspirin  EC 81 MG tablet, Take 81 mg by mouth daily., Disp: , Rfl:    beta carotene 25000 UNIT capsule, Take 25,000 Units by mouth daily.  , Disp: , Rfl:    carvedilol  (COREG ) 3.125 MG tablet, Take 1 tablet (3.125 mg total) by mouth 2 (two) times daily with a meal., Disp: 60 tablet, Rfl: 0   Cholecalciferol (VITAMIN D3) 250 MCG (10000 UT) capsule, Take 10,000 Units by mouth 2 (two) times daily., Disp: , Rfl:    clopidogrel  (PLAVIX ) 75 MG tablet, TAKE ONE TABLET BY MOUTH EVERY DAY, Disp: 90 tablet, Rfl: 3   Continuous Glucose Sensor (FREESTYLE LIBRE 3 SENSOR) MISC, 1 each by Does not apply route every 14 (fourteen) days., Disp: 6 each, Rfl: 3   gabapentin  (NEURONTIN ) 100 MG capsule, TAKE 1 CAPSULE BY MOUTH TWICE DAILY (Patient taking differently: Take 100 mg by mouth at bedtime.), Disp: 180 capsule, Rfl: 1   Homeopathic  Products (THERAWORX RELIEF EX), Apply 1 application topically 3 (three) times daily as needed (pain.)., Disp: , Rfl:    icosapent  Ethyl (VASCEPA ) 1 g capsule, Take 2 capsules (2 g total) by mouth 2 (two) times daily. (Patient taking differently: Take 1 g by mouth 2 (two) times daily.), Disp: 360 capsule, Rfl: 3   insulin  NPH Human (NOVOLIN  N) 100 UNIT/ML injection, INJECT 8 UNITS SUBCUTANEOUSLY TWO TIMES A DAY (Patient taking differently: Inject 36 Units into the skin at bedtime.), Disp: , Rfl:    insulin  regular (NOVOLIN  R) 100 units/mL injection, INJECT 5 units into the skin THREE TIMES daily (Patient taking differently: Inject into the skin 2 (two) times daily with a meal. Insulin  R: 22 >> 20 units with a  smaller meal and  24 >> 22 units wiith a  regular meal 71-90: take 16 units of R insulin  50-70: do not take R insulin ), Disp: , Rfl:    Insulin  Syringe-Needle U-100 (BD VEO INSULIN  SYRINGE U/F) 31G X 15/64 1 ML MISC, USE 4 TIMES DAILY AS DIRECTED, Disp: 400 each, Rfl: 3   latanoprost  (XALATAN ) 0.005 % ophthalmic solution, Apply to eye., Disp: , Rfl:    linezolid  (ZYVOX ) 600 MG tablet, Take 1 tablet (600 mg total) by mouth 2 (two) times daily., Disp: 14 tablet, Rfl: 0   Magnesium Oxide -Mg Supplement ([employer]S MAGNESIUM OXIDE) 250 MG TABS, Take by mouth., Disp: , Rfl:    Menthol,  Topical Analgesic, (ZIMS MAX-FREEZE EX), Apply 1 application topically 4 (four) times daily as needed (pain)., Disp: , Rfl:    metFORMIN  (GLUCOPHAGE -XR) 500 MG 24 hr tablet, TAKE 1 TABLET BY MOUTH 3 TIMES DAILY WITH MEALS (Patient taking differently: 2 (two) times daily with a meal. TAKE 1 TABLET BY MOUTH 3 TIMES DAILY WITH MEALS), Disp: 270 tablet, Rfl: 1   Misc Natural Products (OSTEO BI-FLEX JOINT SHIELD PO), Take 1 tablet by mouth 2 (two) times daily., Disp: , Rfl:    MYRBETRIQ  25 MG TB24 tablet, Take 25 mg by mouth daily., Disp: , Rfl:    nitroGLYCERIN  (NITROSTAT ) 0.4 MG SL tablet, PLACE 1 TABLET UNDER THE TONGUE EVERY 5  MINUTES AS NEEDED FOR CHEST PAIN UP TO 3 DOSES, IF SYMPTOMS PERSIST CALL 911, Disp: 25 tablet, Rfl: 3   OZEMPIC , 1 MG/DOSE, 4 MG/3ML SOPN, INJECT 1MG  SUBCUTANEOUSLY  ONCE A WEEK (Patient taking differently: Inject 1 mg into the skin once a week. INJECT 1MG  SUBCUTANEOUSLY  ONCE A WEEK ON SUNDAYS), Disp: 9 mL, Rfl: 3   ramipril  (ALTACE ) 10 MG capsule, TAKE 1 CAPSULE BY MOUTH DAILY, Disp: 90 capsule, Rfl: 1   rosuvastatin  (CRESTOR ) 10 MG tablet, Take 1 tablet (10 mg total) by mouth daily., Disp: 90 tablet, Rfl: 3   ULTRATRAK PRO TEST test strip, TEST BLOOD SUGAR 5 TIMES DAILY AS INSTRUCTED, Disp: 450 each, Rfl: 1   Medications ordered in this encounter:  Meds ordered this encounter  Medications   benzonatate  (TESSALON ) 100 MG capsule    Sig: Take 1 capsule (100 mg total) by mouth 3 (three) times daily as needed for cough.    Dispense:  30 capsule    Refill:  0    Supervising Provider:   LAMPTEY, PHILIP O [[id number]]   fluticasone  (FLONASE ) 50 MCG/ACT nasal spray    Sig: Place 2 sprays into both nostrils daily.    Dispense:  16 g    Refill:  0    Supervising Provider:   BLAISE ALEENE KIDD [[id number]]     *If you need refills on other medications prior to your next appointment, please contact your pharmacy*  Follow-Up: Call back or seek an in-person evaluation if the symptoms worsen or if the condition fails to improve as anticipated.  [facility] [phone]  Other Instructions   URI recommendations: -Take medications as discussed, move foot appt, take covid/flu test to be safe - Increased rest - Increasing Fluids - Acetaminophen  / ibuprofen as needed for fever/pain.  - Salt water  gargling, chloraseptic spray and throat lozenges - Mucinex-plain if mucus is present and increasing.  - Saline nasal spray if congestion or if nasal passages feel dry. - Humidifying the air.   If you have been instructed to have an in-person evaluation today at a local Urgent Care facility,  please use the link below. It will take you to a list of all of our available [facility], including address, phone number and hours of operation. Please do not delay care.  [facility]  If you or a family member do not have a primary care provider, use the link below to schedule a visit and establish care. When you choose a [facility] primary care physician or advanced practice provider, you gain a long-term partner in health. Find a Primary Care Provider  Learn more about [facility]'s in-office and virtual care options: [facility] - Get Care Now

## 2023-10-20 NOTE — Addendum Note (Signed)
 Addended by: Pollie Meyer on: 10/20/2023 04:36 PM   Modules accepted: Orders

## 2023-10-21 ENCOUNTER — Encounter: Payer: Self-pay | Admitting: Podiatry

## 2023-10-21 ENCOUNTER — Ambulatory Visit (INDEPENDENT_AMBULATORY_CARE_PROVIDER_SITE_OTHER): Payer: Federal, State, Local not specified - PPO | Admitting: Podiatry

## 2023-10-21 ENCOUNTER — Other Ambulatory Visit: Payer: Self-pay | Admitting: Podiatry

## 2023-10-21 DIAGNOSIS — Z9889 Other specified postprocedural states: Secondary | ICD-10-CM

## 2023-10-21 DIAGNOSIS — M869 Osteomyelitis, unspecified: Secondary | ICD-10-CM

## 2023-10-21 MED ORDER — LINEZOLID 600 MG PO TABS: 600.0000 mg | ORAL_TABLET | Freq: Two times a day (BID) | ORAL | 0 refills | Status: AC

## 2023-10-21 NOTE — Progress Notes (Signed)
  Subjective:  Patient ID: Betty [name], female    DOB: October 21, 1955,  MRN: [id number]  Postop check 1  DOS: 09/29/2023 Procedure: 1.  Amputation of second toe at MPJ level, left foot 2.  First metatarsal head resection, left foot  68 y.o. female seen for post op check.  Patient reports she has kept dressing clean dry intact since last appointment with Dr. Alona.  Has been taking Zyvox .  She reports she was concerned about some redness on the top of the left foot and thinks it is slightly more swollen than prior.  She is sick from a respiratory virus at this time.  Review of Systems: Negative except as noted in the HPI. Denies N/V/F/Ch.   Objective:  There were no vitals filed for this visit. There is no height or weight on file to calculate BMI. Constitutional Well developed. Well nourished.  Vascular Foot warm and well perfused. Capillary refill normal to all digits.   No calf pain with palpation  Neurologic Normal speech. Oriented to person, place, and time. Epicritic sensation diminished to the forefoot  Dermatologic Amputation site healing very well at the second toe and first metatarsal head resection no dehiscence erythema or drainage.  Mild erythema laterally on the dorsal flap however no drainage or other concern for infection   Orthopedic: Status post second toe amputation and first metatarsal head resection   Radiographs: Deferred at this visit will consider next appointment  Pathology: Second toe osteomyelitis acute, negative for osteomyelitis first metatarsal head  Micro: Enterococcus faecalis and Staph epidermidis  Assessment:   1. Osteomyelitis of second toe of left foot (HCC)   2. Post-operative state    Plan:  Patient was evaluated and treated and all questions answered.   s/p left second toe amputation first metatarsal head resection -Progressing very well with healing amputation site though there is concern for some erythema at the lateral aspect  of the amputation line.  Will extend Zyvox  for this.  -XR: Deferred at this visit  -WB Status: Weightbearing as tolerated in surgical shoe -Sutures: Removed remaining staples and sutures at this visit apply Steri-Strips -Medications/ABX: Continue antibiotics until course completed -Foot redressed with sock okay to get operative foot warm in the shower and wash with warm soapy water  and then dry and reapply adhesive bandage over the surgical site. - Return in 2 weeks for recheck        Betty [name], DPM [facility] / [facility]

## 2023-10-26 ENCOUNTER — Encounter: Payer: Self-pay | Admitting: Podiatry

## 2023-10-31 ENCOUNTER — Encounter: Payer: Self-pay | Admitting: Podiatry

## 2023-11-04 ENCOUNTER — Ambulatory Visit: Payer: Federal, State, Local not specified - PPO | Admitting: Podiatry

## 2023-11-08 ENCOUNTER — Telehealth: Payer: Self-pay

## 2023-11-08 DIAGNOSIS — E1165 Type 2 diabetes mellitus with hyperglycemia: Secondary | ICD-10-CM

## 2023-11-08 MED ORDER — METFORMIN HCL ER 500 MG PO TB24: ORAL_TABLET | ORAL | 2 refills | Status: DC

## 2023-11-08 NOTE — Telephone Encounter (Signed)
Requested Prescriptions   Signed Prescriptions Disp Refills   metFORMIN (GLUCOPHAGE-XR) 500 MG 24 hr tablet 270 tablet 2    Sig: TAKE 1 TABLET BY MOUTH 3 TIMES DAILY WITH MEALS    Authorizing Provider: Carlus Pavlov    Ordering User: Pollie Meyer

## 2023-11-11 ENCOUNTER — Other Ambulatory Visit (HOSPITAL_COMMUNITY): Payer: Self-pay

## 2023-11-11 ENCOUNTER — Ambulatory Visit: Payer: Medicare Other | Attending: Physician Assistant | Admitting: Emergency Medicine

## 2023-11-11 ENCOUNTER — Encounter: Payer: Self-pay | Admitting: Emergency Medicine

## 2023-11-11 ENCOUNTER — Ambulatory Visit (INDEPENDENT_AMBULATORY_CARE_PROVIDER_SITE_OTHER): Payer: Federal, State, Local not specified - PPO | Admitting: Podiatry

## 2023-11-11 VITALS — BP 134/82 | HR 80 | Ht 66.0 in | Wt 255.6 lb

## 2023-11-11 DIAGNOSIS — I10 Essential (primary) hypertension: Secondary | ICD-10-CM

## 2023-11-11 DIAGNOSIS — R079 Chest pain, unspecified: Secondary | ICD-10-CM

## 2023-11-11 DIAGNOSIS — E782 Mixed hyperlipidemia: Secondary | ICD-10-CM

## 2023-11-11 DIAGNOSIS — I493 Ventricular premature depolarization: Secondary | ICD-10-CM | POA: Diagnosis present

## 2023-11-11 DIAGNOSIS — I2510 Atherosclerotic heart disease of native coronary artery without angina pectoris: Secondary | ICD-10-CM

## 2023-11-11 DIAGNOSIS — Z9889 Other specified postprocedural states: Secondary | ICD-10-CM

## 2023-11-11 DIAGNOSIS — M869 Osteomyelitis, unspecified: Secondary | ICD-10-CM

## 2023-11-11 MED ORDER — CARVEDILOL 3.125 MG PO TABS
3.1250 mg | ORAL_TABLET | Freq: Two times a day (BID) | ORAL | 0 refills | Status: DC
  Filled 2023-11-11 – 2023-11-23 (×2): qty 60, 30d supply, fill #0

## 2023-11-11 MED ORDER — NITROGLYCERIN 0.4 MG SL SUBL
0.4000 mg | SUBLINGUAL_TABLET | Freq: Every day | SUBLINGUAL | 3 refills | Status: AC | PRN
  Filled 2023-11-11: qty 25, 25d supply, fill #0

## 2023-11-11 NOTE — Progress Notes (Signed)
  Subjective:  Patient ID: Betty [name], female    DOB: Jan 17, 1956,  MRN: [id number]  Postop check 1  DOS: 09/29/2023 Procedure: 1.  Amputation of second toe at MPJ level, left foot 2.  First metatarsal head resection, left foot  68 y.o. female seen for post op check.  Patient reports she is doing well.  Recently went to [place] for a family event.  Has been walking in postop shoe.  Does have a small wound that opened up on the top of the foot due to concern for rubbing from the postop shoe.  Review of Systems: Negative except as noted in the HPI. Denies N/V/F/Ch.   Objective:  There were no vitals filed for this visit. There is no height or weight on file to calculate BMI. Constitutional Well developed. Well nourished.  Vascular Foot warm and well perfused. Capillary refill normal to all digits.   No calf pain with palpation  Neurologic Normal speech. Oriented to person, place, and time. Epicritic sensation diminished to the forefoot  Dermatologic Amputation site fully healed.  No drainage dehiscence erythema or other sign infection.  Edema much reduced from prior there is a small superficial pressure blister however it appears to be almost fully healed patient states this was from rubbing on the postop shoe.       Orthopedic: Status post second toe amputation and first metatarsal head resection   Radiographs: Deferred at this visit  Pathology: Second toe osteomyelitis acute, negative for osteomyelitis first metatarsal head  Micro: Enterococcus faecalis and Staph epidermidis  Assessment:   No diagnosis found.  Plan:  Patient was evaluated and treated and all questions answered.   s/p left second toe amputation first metatarsal head resection -Progressing very well -appears fully healed at this time -XR: Deferred at this visit  -WB Status: Weightbearing as tolerated in regular shoes at this time.  Would like the patient to see our pedorthist to get fitted for  diabetic shoes.  She will make appointment for this. -Sutures: Previously removed -Medications/ABX: Completed course of antibiotics no further antibiotics indicated at this time -No further dressings required.  I did ask the patient put a Band-Aid over the small superficial pressure blister on the dorsal foot.  Believe this will heal within the next couple days.  Antibiotic ointment as needed otherwise no dressings required she is fully healed in regards to the surgery that was completed. - Return in 4 weeks with Dr. Ardelle Anton for ongoing routine care        Corinna Gab, DPM [facility] / [facility]

## 2023-11-11 NOTE — Progress Notes (Signed)
Cardiology Office Note:    Date:  11/11/2023  ID:  Betty [name], DOB [birth year], MRN [id number] PCP: Ailene Ravel, MD  [facility] Providers Cardiologist:  Charlton Haws, MD       Patient Profile:      Betty [name] is a 68 y.o. female with visit-pertinent history of CAD, hypertension, T2DM, hyperlipidemia with statin intolerance, OSA, PVCs  He has history of CAD s/p stenting to LAD in 2006.  Seen by neuro for dizziness, CT/MRI head normal 04/11/2018 with question of brainstem cerebellum stroke despite negative MRI and she was started on Plavix.  Carotids with no significant stenosis on 08/25/2018.  Left heart catheterization 09/06/2019 showed severe subtotal occlusion of the mid circumflex treated with PCI/DES, LAD stent was patent.  Echocardiogram 09/30/2023 shows LVEF 60-65%, no RWMA, grade 1 DD, RV SF normal, left atrial size mildly dilated, no valvular abnormalities.      History of Present Illness:  Discussed the use of AI scribe software for clinical note transcription with the patient, who gave verbal consent to proceed.  Betty [name] is a 68 y.o. female who returns for 1 year follow-up for CAD.   She comes in today by herself.  She has no cardiovascular concerns or complaints over the last year and a half.  She notes she has been dealing with multiple family deaths over the past year which has been very stressful for her.  She notes she has been battling osteomyelitis in her left foot due to her uncontrolled type 2 diabetes.  She notes the foot infection greatly limits her physical activity as she does not get much or if any exercise at home.  She is however able to take care of herself and drive herself to her appointments and take care of her house she does live alone.  She does note she is interested in moving to [place] this year to be closer to family.  She has been adherent to her medication regimen except did not pick up refill of her Vascepa  recently. She plans to restart this.  She is without any exertional angina, dyspnea, orthopnea, syncope, palpitations.    Review of Systems  Constitutional: Negative for weight gain and weight loss.  Cardiovascular:  Negative for chest pain, claudication, dyspnea on exertion, irregular heartbeat, leg swelling, near-syncope, orthopnea, palpitations, paroxysmal nocturnal dyspnea and syncope.  Respiratory:  Negative for cough, hemoptysis and shortness of breath.   Gastrointestinal:  Negative for abdominal pain, hematochezia and melena.  Genitourinary:  Negative for hematuria.  Neurological:  Negative for dizziness and light-headedness.     See HPI     Home Medications:    Prior to Admission medications   Medication Sig Start Date End Date Taking? Authorizing Provider  acetaminophen (TYLENOL) 650 MG CR tablet Take 650-1,300 mg by mouth every 8 (eight) hours as needed for pain.     [provider]  aspirin EC 81 MG tablet Take 81 mg by mouth daily.    [provider]  benzonatate (TESSALON) 100 MG capsule Take 1 capsule (100 mg total) by mouth 3 (three) times daily as needed for cough. 10/20/23   Freddy Finner, NP  beta carotene 86578 UNIT capsule Take 25,000 Units by mouth daily.      [provider]  carvedilol (COREG) 3.125 MG tablet Take 1 tablet (3.125 mg total) by mouth 2 (two) times daily with a meal. 08/27/23   Wendall Stade, MD  Cholecalciferol (VITAMIN D3) 250  MCG (10000 UT) capsule Take 10,000 Units by mouth 2 (two) times daily.    [provider]  clopidogrel (PLAVIX) 75 MG tablet TAKE ONE TABLET BY MOUTH EVERY DAY 03/22/23   Wendall Stade, MD  Continuous Glucose Sensor (FREESTYLE LIBRE 3 SENSOR) MISC 1 each by Does not apply route every 14 (fourteen) days. 10/20/23   Carlus Pavlov, MD  fluticasone (FLONASE) 50 MCG/ACT nasal spray Place 2 sprays into both nostrils daily. 10/20/23   Freddy Finner, NP  gabapentin (NEURONTIN) 100 MG capsule TAKE  1 CAPSULE BY MOUTH TWICE DAILY Patient taking differently: Take 100 mg by mouth at bedtime. 01/04/23   Carlus Pavlov, MD  Homeopathic Products [facility] RELIEF EX) Apply 1 application topically 3 (three) times daily as needed (pain.).    [provider]  icosapent Ethyl (VASCEPA) 1 g capsule Take 2 capsules (2 g total) by mouth 2 (two) times daily. Patient taking differently: Take 1 g by mouth 2 (two) times daily. 04/13/22   Wendall Stade, MD  insulin NPH Human (NOVOLIN N) 100 UNIT/ML injection INJECT 8 UNITS SUBCUTANEOUSLY TWO TIMES A DAY Patient taking differently: Inject 36 Units into the skin at bedtime. 08/03/23   Pennie Banter, DO  insulin regular (NOVOLIN R) 100 units/mL injection INJECT 5 units into the skin THREE TIMES daily Patient taking differently: Inject into the skin 2 (two) times daily with a meal. Insulin R: 22 >> 20 units with a  smaller meal and  24 >> 22 units wiith a  regular meal 71-90: take 16 units of R insulin 50-70: do not take R insulin 08/03/23   Esaw Grandchild A, DO  Insulin Syringe-Needle U-100 (BD VEO INSULIN SYRINGE U/F) 31G X 15/64" 1 ML MISC USE 4 TIMES DAILY AS DIRECTED 03/01/23   Carlus Pavlov, MD  latanoprost (XALATAN) 0.005 % ophthalmic solution Apply to eye. 09/14/22   [provider]  linezolid (ZYVOX) 600 MG tablet Take 1 tablet (600 mg total) by mouth 2 (two) times daily. 10/15/23   Vivi Barrack, DPM  Magnesium Oxide -Mg Supplement ([employer] MAGNESIUM OXIDE) 250 MG TABS Take by mouth.    [provider]  Menthol, Topical Analgesic, (ZIMS MAX-FREEZE EX) Apply 1 application topically 4 (four) times daily as needed (pain).    [provider]  metFORMIN (GLUCOPHAGE-XR) 500 MG 24 hr tablet TAKE 1 TABLET BY MOUTH 3 TIMES DAILY WITH MEALS 11/08/23   Carlus Pavlov, MD  Misc Natural Products (OSTEO BI-FLEX JOINT SHIELD PO) Take 1 tablet by mouth 2 (two) times daily.    [provider]  MYRBETRIQ 25 MG TB24  tablet Take 25 mg by mouth daily.    [provider]  nitroGLYCERIN (NITROSTAT) 0.4 MG SL tablet PLACE 1 TABLET UNDER THE TONGUE EVERY 5 MINUTES AS NEEDED FOR CHEST PAIN UP TO 3 DOSES, IF SYMPTOMS PERSIST CALL 911 12/05/21   Wendall Stade, MD  OZEMPIC, 1 MG/DOSE, 4 MG/3ML SOPN INJECT 1MG  SUBCUTANEOUSLY  ONCE A WEEK Patient taking differently: Inject 1 mg into the skin once a week. INJECT 1MG  SUBCUTANEOUSLY  ONCE A WEEK ON SUNDAYS 05/24/23   Carlus Pavlov, MD  ramipril (ALTACE) 10 MG capsule TAKE 1 CAPSULE BY MOUTH DAILY 01/26/23   Carlus Pavlov, MD  rosuvastatin (CRESTOR) 10 MG tablet Take 1 tablet (10 mg total) by mouth daily. 12/31/22   Carlus Pavlov, MD  ULTRATRAK PRO TEST test strip TEST BLOOD SUGAR 5 TIMES DAILY AS INSTRUCTED 10/10/18   Carlus Pavlov,  MD   Studies Reviewed:       Echocardiogram 09/30/2023 1. Left ventricular ejection fraction, by estimation, is 60 to 65%. The  left ventricle has normal function. The left ventricle has no regional  wall motion abnormalities. Left ventricular diastolic parameters are  consistent with Grade I diastolic  dysfunction (impaired relaxation).   2. Right ventricular systolic function is normal. The right ventricular  size is normal. Tricuspid regurgitation signal is inadequate for assessing  PA pressure.   3. Left atrial size was mildly dilated.   4. The mitral valve is normal in structure. No evidence of mitral valve  regurgitation.   5. The aortic valve is tricuspid. Aortic valve regurgitation is not  visualized.   6. The inferior vena cava is normal in size with greater than 50%  respiratory variability, suggesting right atrial pressure of 3 mmHg.   Right/Left cardiac catheterization 09/06/2019 1. Continued patency of the LAD stent 2. Patent RCA 3. Severe subtotal occlusion of the mid-circumflex treated with PCI using a 2.25x22 mm Resolute Onyx DES 4. Essentially normal right heart pressures and preserved cardiac  output Diagnostic Dominance: Right  Intervention   Risk Assessment/Calculations:             Physical Exam:   VS:  BP 134/82   Pulse 80   Ht 5\' 6"  (1.676 m)   Wt 255 lb 9.6 oz (115.9 kg)   SpO2 99%   BMI 41.25 kg/m    Wt Readings from Last 3 Encounters:  11/11/23 255 lb 9.6 oz (115.9 kg)  10/14/23 254 lb 9.6 oz (115.5 kg)  10/07/23 250 lb (113.4 kg)    Constitutional:      Appearance: Normal and healthy appearance. Not in distress.  HENT:     Head: Normocephalic.  Neck:     Vascular: JVD normal.  Pulmonary:     Effort: Pulmonary effort is normal.     Breath sounds: Normal breath sounds.  Chest:     Chest wall: Not tender to palpatation.  Cardiovascular:     PMI at left midclavicular line. Normal rate. Regular rhythm. Normal S1. Normal S2.      Murmurs: There is no murmur.     No gallop.  No click. No rub.  Pulses:    Intact distal pulses.  Edema:    Peripheral edema absent.  Musculoskeletal: Normal range of motion.     Cervical back: Normal range of motion and neck supple. Skin:    General: Skin is warm and dry.  Neurological:     General: No focal deficit present.     Mental Status: Alert, oriented to person, place, and time and oriented to person, place and time.  Psychiatric:        Mood and Affect: Mood and affect normal.        Behavior: Behavior is cooperative.        Thought Content: Thought content normal.        Assessment and Plan:  Coronary artery disease  S/p stenting to LAD in 2006 and PCI/DES to mid circumflex in 2020 Over the past year she has been stable without any exertional symptoms.  There is no indication for ischemic evaluation at this time -Dr. Eden Emms has pt on DAPT indefinitely d/t questionable stroke in 2019 -Continue Aspirin 81mg  daily, Plavix 75mg  daily, Rosuvastatin 10mg  daily   Hyperlipidemia, LDL goal <55 LDL 38 05/2023 and under excellent control  -Continue Rosuvastatin 10mg  daily  -She has not been taking Vascepa  recently but plans to restart - She plans to f/u with PCP for repeat lipid follow-up -Encouraged heart healthy dieting   HTN BP today 134/82. Slightly above goal but recent BP at endocrinology was <130/80 -Monitor BP at home and keep BP diary, follow up with PCP -Continue Carvedilol 3.125mg  twice daily, Ramipril 10mg  daily - if additional BP control needed in the future, consider increase to 20mg  daily  T2DM A1c 7.2 on 09/28/2023 -Managed by endocrinology  PVC Quiescent -Managed on carvedilol 3.125mg              Dispo:  Return in about 1 year (around 11/10/2024).  Signed, Denyce Robert, NP

## 2023-11-11 NOTE — Patient Instructions (Signed)
Medication Instructions:  No changes  *If you need a refill on your cardiac medications before your next appointment, please call your pharmacy*   Lab Work: None  If you have labs (blood work) drawn today and your tests are completely normal, you will receive your results only by: MyChart Message (if you have MyChart) OR A paper copy in the mail If you have any lab test that is abnormal or we need to change your treatment, we will call you to review the results.   Testing/Procedures: None    Follow-Up: At [facility], you and your health needs are our priority.  As part of our continuing mission to provide you with exceptional heart care, we have created designated Provider Care Teams.  These Care Teams include your primary Cardiologist (physician) and Advanced Practice Providers (APPs -  Physician Assistants and Nurse Practitioners) who all work together to provide you with the care you need, when you need it.    Your next appointment:   1 year(s)  Provider:   Charlton Haws, MD     Other Instructions

## 2023-11-16 ENCOUNTER — Encounter: Payer: Self-pay | Admitting: Family Medicine

## 2023-11-16 ENCOUNTER — Telehealth: Payer: Medicare Other

## 2023-11-16 ENCOUNTER — Ambulatory Visit: Payer: Self-pay

## 2023-11-16 ENCOUNTER — Encounter: Payer: Self-pay | Admitting: Internal Medicine

## 2023-11-16 NOTE — Patient Outreach (Signed)
  Care Coordination   11/16/2023 Name: Betty [name] MRN: [id number] DOB: 09/22/1956   Care Coordination Outreach Attempts:  An unsuccessful outreach was attempted for an appointment today.  Follow Up Plan:  Additional outreach attempts will be made to offer the patient complex care management information and services.   Encounter Outcome:  No Answer   Care Coordination Interventions:  No, not indicated    Tillman Gardener, BSW Friend  [facility], [facility] Social Worker Direct Dial: [phone]  Fax: [phone] Website: [email or web address]

## 2023-11-16 NOTE — Progress Notes (Signed)
 The patient no-showed for appointment despite this provider sending direct link, reaching out via phone with no response and waiting for at least 10 minutes from appointment time for patient to join. They will be marked as a NS for this appointment/time.   Freddy Finner, NP

## 2023-11-17 ENCOUNTER — Other Ambulatory Visit: Payer: Self-pay | Admitting: *Deleted

## 2023-11-17 ENCOUNTER — Telehealth: Payer: Self-pay | Admitting: *Deleted

## 2023-11-17 MED ORDER — INSULIN SYRINGE 31G X 5/16" 0.5 ML MISC: 5 refills | Status: AC

## 2023-11-17 NOTE — Patient Outreach (Signed)
  Care Management   Follow Up Note   11/17/2023 Name: Betty [name] MRN: [id number] DOB: 03-05-1956   Referred by: Stephanie Charlene CROME, MD Reason for referral : Care Management (RNCM: ATTEMPT #1 Follow Up For Chronic Disease Management & Care Coordination Needs)   An unsuccessful telephone outreach was attempted today. The patient was referred to the case management team for assistance with care management and care coordination.   Follow Up Plan: The care management team will reach out to the patient again over the next 7-14 days.   Rosina Sicard, BSN RN [facility], [facility] RN Care Manager Direct Dial: [phone]  Fax: [phone]

## 2023-11-23 ENCOUNTER — Other Ambulatory Visit (HOSPITAL_COMMUNITY): Payer: Self-pay

## 2023-11-30 ENCOUNTER — Ambulatory Visit: Payer: Medicare Other | Admitting: Physician Assistant

## 2023-12-10 ENCOUNTER — Other Ambulatory Visit (HOSPITAL_COMMUNITY): Payer: Self-pay

## 2023-12-10 ENCOUNTER — Ambulatory Visit: Payer: Federal, State, Local not specified - PPO | Admitting: Podiatry

## 2023-12-10 ENCOUNTER — Telehealth: Payer: Self-pay | Admitting: *Deleted

## 2023-12-10 NOTE — Progress Notes (Signed)
 Complex Care Management Care Guide Note  12/10/2023 Name: JAMALA KOHEN MRN: [id number] DOB: 1955/12/29  [name] is a 68 y.o. year old female who is a primary care patient of Hamrick, Durward Fortes, MD and is actively engaged with the care management team. I reached out to [name] by phone today to assist with re-scheduling  with the RN Case Manager BSW.  Follow up plan: Unsuccessful telephone outreach attempt made. A HIPAA compliant phone message was left for the patient providing contact information and requesting a return call.  Gwenevere Ghazi  [facility]  [facility], [facility] Guide  Direct Dial: [phone]  Fax [phone]

## 2023-12-13 ENCOUNTER — Other Ambulatory Visit: Payer: Medicare Other

## 2023-12-16 ENCOUNTER — Other Ambulatory Visit: Payer: Self-pay

## 2023-12-16 MED ORDER — MYRBETRIQ 25 MG PO TB24: ORAL_TABLET | ORAL | 3 refills | Status: AC

## 2023-12-17 NOTE — Progress Notes (Signed)
 Complex Care Management Care Guide Note  12/17/2023 Name: Betty [name] MRN: [id number] DOB: 1956/01/08  Betty [name] is a 68 y.o. year old female who is a primary care patient of Hamrick, Durward Fortes, MD and is actively engaged with the care management team. I reached out to Betty [name] by phone today to assist with re-scheduling  with the RN Case Manager BSW.  Follow up plan: Unsuccessful telephone outreach attempt made. A HIPAA compliant phone message was left for the patient providing contact information and requesting a return call. No further outreach attempts will be made at this time. We have been unable to contact the patient to reschedule for complex care management services.   Gwenevere Ghazi  [facility]  Value[facility]Based Care Institute, [facility] Guide  Direct Dial: [phone]  Fax [phone]

## 2023-12-21 ENCOUNTER — Ambulatory Visit: Payer: Federal, State, Local not specified - PPO | Admitting: Podiatry

## 2024-01-03 ENCOUNTER — Ambulatory Visit

## 2024-01-03 ENCOUNTER — Ambulatory Visit (INDEPENDENT_AMBULATORY_CARE_PROVIDER_SITE_OTHER): Admitting: Podiatry

## 2024-01-03 ENCOUNTER — Encounter: Payer: Self-pay | Admitting: Podiatry

## 2024-01-03 ENCOUNTER — Ambulatory Visit (INDEPENDENT_AMBULATORY_CARE_PROVIDER_SITE_OTHER)

## 2024-01-03 DIAGNOSIS — L97511 Non-pressure chronic ulcer of other part of right foot limited to breakdown of skin: Secondary | ICD-10-CM | POA: Diagnosis not present

## 2024-01-03 DIAGNOSIS — L03031 Cellulitis of right toe: Secondary | ICD-10-CM

## 2024-01-03 DIAGNOSIS — M79671 Pain in right foot: Secondary | ICD-10-CM

## 2024-01-03 DIAGNOSIS — L02611 Cutaneous abscess of right foot: Secondary | ICD-10-CM | POA: Diagnosis not present

## 2024-01-03 MED ORDER — CEPHALEXIN 500 MG PO CAPS: 500.0000 mg | ORAL_CAPSULE | Freq: Three times a day (TID) | ORAL | 0 refills | Status: DC

## 2024-01-03 MED ORDER — SILVER SULFADIAZINE 1 % EX CREA: 1.0000 | TOPICAL_CREAM | Freq: Every day | CUTANEOUS | 0 refills | Status: AC

## 2024-01-03 NOTE — Telephone Encounter (Signed)
 Patient seen in office today by Dr.Wagoner.

## 2024-01-06 NOTE — Progress Notes (Signed)
  Subjective:  Patient ID: Betty [name], female    DOB: 03-14-1956,  MRN: [id number]  Chief Complaint  Patient presents with   Foot Pain    RM#11 Right big toe infection and wants to follow up on left foot.    Discussed the use of AI scribe software for clinical note transcription with the patient, who gave verbal consent to proceed.  History of Present Illness The patient, with a history of toe amputations, was scheduled for an eye surgery but it was postponed due to a superficial wound on the right foot. The wound, measuring about 1.8 by 2 cm, is suspected to have been caused by long toenails and tight shoes. The patient reports that the wound was initially hard and had clear liquid coming out from under the toenail. The patient also reports discomfort due to long toenails and is considering open-toed shoes for comfort. The patient is also in need of new shoe inserts.      Objective:    Physical Exam General: AAO x3, NAD  Dermatological: Superficial wound is on the right big toe measuring 2 x 1.8 cm as pictured below.  There is no purulence noted today or any drainage.  There is localized edema without any ascending cellulitis.  No fluctuation, crepitation, malodor.  Vascular: Dorsalis Pedis artery and Posterior Tibial artery pedal pulses are palpable bilateral with immedate capillary fill time.  There is no pain with calf compression, swelling, warmth, erythema.   Neruologic: Grossly intact via light touch bilateral.  Sensation decreased.  Musculoskeletal: Amputations present second toes left foot.         Results Procedure: Toenail Trimming Description: Trimmed the nail back slightly.  DIAGNOSTIC Wound measurement: 1.8 x 2 cm, superficial (01/03/2024)   Assessment:   1. Toe ulcer, right, limited to breakdown of skin (HCC)   2. Abscess or cellulitis, toe, right      Plan:  Patient was evaluated and treated and all questions answered.  Assessment and  Plan Assessment & Plan Superficial foot wound Superficial wound with slight irritation and drainage. Likely caused by shoe pressure and long toenails. - Prescribed oral antibiotic (Keflex). - Prescribed topical antibiotic cream (Silvadene). - Advised wearing an open-toe surgical shoe to reduce pressure. - Instructed to apply a small amount of antibiotic cream to the wound. - Monitor for signs of infection: increased swelling, redness, drainage, fever, chills.  Toenail issues Long and rough toenails contributed to wound irritation and drainage. Toenails trimmed to prevent further issues. - Trimmed without any complications or bleeding today. - Advised against self-trimming to prevent injury.  Follow-up Close follow-up necessary for wound healing and orthotic needs. - Schedule follow-up appointment in one week. - Coordinate with Trish for orthotic assessment during follow-up.  Vivi Barrack DPM     Return in about 1 week (around 01/10/2024).

## 2024-01-07 ENCOUNTER — Ambulatory Visit: Admitting: Podiatry

## 2024-01-10 ENCOUNTER — Ambulatory Visit (INDEPENDENT_AMBULATORY_CARE_PROVIDER_SITE_OTHER): Admitting: Podiatry

## 2024-01-10 DIAGNOSIS — M79675 Pain in left toe(s): Secondary | ICD-10-CM | POA: Diagnosis not present

## 2024-01-10 DIAGNOSIS — B351 Tinea unguium: Secondary | ICD-10-CM | POA: Diagnosis not present

## 2024-01-10 DIAGNOSIS — L97511 Non-pressure chronic ulcer of other part of right foot limited to breakdown of skin: Secondary | ICD-10-CM | POA: Diagnosis not present

## 2024-01-10 DIAGNOSIS — M79674 Pain in right toe(s): Secondary | ICD-10-CM

## 2024-01-10 NOTE — Progress Notes (Signed)
  Subjective:  Patient ID: Betty [name], female    DOB: 1956/06/01,  MRN: [id number]  Chief Complaint  Patient presents with   Foot Ulcer    RM#11 Right foot ulcer big toe follow up patient not in any pain / left foot nail trim.     68 year old female presents with her son today for follow-up evaluation of ulceration present the right big toe.  He is to be given Silvadene on the wound daily.  Did not see any drainage or pus.  The patient states it is difficult for her to see the wounds that she is not sure how is doing.  She does not report any fevers or chills.  Also does have her nails symptoms are thickened elongated she cannot trim her self may cause discomfort but no elongated.  No new open lesions or other concerns.      Objective:    Physical Exam General: AAO x3, NAD  Dermatological: Superficial wound is on the right big toe measuring somewhat smaller but still present there is no probing, undermining or tunneling.  There is no cellulitis present.  There is no drainage or pus or ascending cellulitis.  No fluctuation, crepitation, malodor.  There is a very hypertrophic, dystrophic nail discoloration and some goal debris present to the nails 3 through 5 on the left and 1 through 5 on the right with tenderness palpation of the associated nails.    Vascular: Dorsalis Pedis artery and Posterior Tibial artery pedal pulses are palpable bilateral with immedate capillary fill time.  There is no pain with calf compression, swelling, warmth, erythema.   Neruologic: Grossly intact via light touch bilateral.  Sensation decreased.  Musculoskeletal: Amputations present second toes left foot.    Assessment:   Toe ulceration right foot; symptomatic onychomycosis  Plan:  Patient was evaluated and treated and all questions answered.  Assessment and Plan Assessment & Plan Superficial foot wound - Finish course of oral antibiotic (Keflex). - Maxorb applied today and given to  the patient to apply daily. - Advised wearing an open-toe surgical shoe to reduce pressure.  Continue surgical shoe -Referral to wound care center - Monitor for signs of infection: increased swelling, redness, drainage, fever, chills.  Symptomatic onychomycosis -Sharply debrided nails x 8 without any complications or bleeding    Return in about 10 days (around 01/20/2024) for wound check.   Vivi Barrack DPM

## 2024-01-17 ENCOUNTER — Encounter: Payer: Self-pay | Admitting: Podiatry

## 2024-01-20 ENCOUNTER — Encounter: Payer: Self-pay | Admitting: Podiatry

## 2024-01-20 ENCOUNTER — Ambulatory Visit (INDEPENDENT_AMBULATORY_CARE_PROVIDER_SITE_OTHER): Admitting: Podiatry

## 2024-01-20 DIAGNOSIS — L97512 Non-pressure chronic ulcer of other part of right foot with fat layer exposed: Secondary | ICD-10-CM | POA: Diagnosis not present

## 2024-01-20 NOTE — Patient Instructions (Signed)
 Monitor for any signs/symptoms of infection. Call the office immediately if any occur or go directly to the emergency room. Call with any questions/concerns.

## 2024-01-23 NOTE — Progress Notes (Signed)
  Subjective:  Patient ID: Betty [name], female    DOB: 1956-01-20,  MRN: [id number]  Chief Complaint  Patient presents with   Wound Check    RM#14 Right foot big toe ulcer check red in color.     68 year old female presents with her son today for follow-up evaluation of ulceration present the right big toe.  She has been applying Maxorb to the wound daily.  She has not seen any significant drainage or pus.  She does not report any fevers or chills.  She has had some brown discoloration to the wound.   No new open lesions or other concerns.      Objective:    Physical Exam General: AAO x3, NAD  Dermatological: Superficial wound is on the right big toe measuring 1 x 1 cm diameter superficial with a granular wound base.  There is hyperkeratotic periwound.  There is no probing, undermining or tunneling.  There is no fluctuation or crepitation.  There is no malodor.  Slight edema to the toe without any cellulitis identified.  Vascular: Dorsalis Pedis artery and Posterior Tibial artery pedal pulses are palpable bilateral with immedate capillary fill time.  There is no pain with calf compression, swelling, warmth, erythema.   Neruologic: Grossly intact via light touch bilateral.  Sensation decreased.  Musculoskeletal: Amputations present second toes left foot.    Assessment:   Toe ulceration right foot; symptomatic onychomycosis  Plan:  Patient was evaluated and treated and all questions answered.  Assessment and Plan Assessment & Plan Superficial foot wound -Medically necessary wound debridement was applied today.  Sharply debride the hyperkeratotic periwound with a #312 blade scalpel down to healthy, bleeding edges.  Prior to debridement the wound measured 1 x 1 cm.  After debridement measuring 1.2 x 1.2 x 0.1 cm.  Minimal blood loss.  Hemostasis sheath and manual compression.  Silvadene applied today followed by dressing.  Tolerated well. - Awaiting referral to wound  care center.  Appointment pending. - Maxorb applied today and given to the patient to apply daily. - Advised wearing an open-toe surgical shoe to reduce pressure.  Continue surgical shoe - Monitor for signs of infection: increased swelling, redness, drainage, fever, chills.  Return in about 2 weeks (around 02/03/2024) for toe ulcer, x-ray right foot.   Charity Conch DPM

## 2024-02-03 ENCOUNTER — Telehealth: Payer: Self-pay | Admitting: Cardiovascular Disease

## 2024-02-03 ENCOUNTER — Telehealth: Payer: Self-pay | Admitting: Internal Medicine

## 2024-02-03 ENCOUNTER — Other Ambulatory Visit: Payer: Self-pay | Admitting: Internal Medicine

## 2024-02-03 ENCOUNTER — Other Ambulatory Visit: Payer: Self-pay | Admitting: Emergency Medicine

## 2024-02-03 MED ORDER — CARVEDILOL 3.125 MG PO TABS: 3.1250 mg | ORAL_TABLET | Freq: Two times a day (BID) | ORAL | 2 refills | Status: DC

## 2024-02-03 MED ORDER — RAMIPRIL 10 MG PO CAPS: 10.0000 mg | ORAL_CAPSULE | Freq: Every day | ORAL | 1 refills | Status: DC

## 2024-02-03 NOTE — Telephone Encounter (Signed)
*  STAT* If patient is at the pharmacy, call can be transferred to refill team.   1. Which medications need to be refilled? (please list name of each medication and dose if known)  carvedilol  (COREG ) 3.125 MG tablet    2. Would you like to learn more about the convenience, safety, & potential cost savings by using the [facility]?     3. Are you open to using the [facility] (Type [facility].  ).   4. Which pharmacy/location (including street and city if local pharmacy) is medication to be sent to? [facility] - [place], [place] - [address]    5. Do they need a 30 day or 90 day supply? 90 day   Pt was seen in office 11/11/23

## 2024-02-03 NOTE — Telephone Encounter (Signed)
 Pt's medication was sent to pt's pharmacy as requested. Confirmation received.

## 2024-02-03 NOTE — Telephone Encounter (Signed)
 Requested Prescriptions   Signed Prescriptions Disp Refills   ramipril  (ALTACE ) 10 MG capsule 90 capsule 1    Sig: Take 1 capsule (10 mg total) by mouth daily.    Authorizing Provider: Emilie Harden    Ordering User: Vernon Goodpasture

## 2024-02-03 NOTE — Telephone Encounter (Signed)
 MEDICATION:  ramipril  ramipril  (ALTACE ) 10 MG capsule  PHARMACY:    [facility] - [place], [place] - [address] (Ph: [phone])    HAS THE PATIENT CONTACTED THEIR PHARMACY?    IS THIS A 90 DAY SUPPLY : Yes  IS PATIENT OUT OF MEDICATION: Yes  IF NOT; HOW MUCH IS LEFT:   LAST APPOINTMENT DATE: @1 /11/2023  NEXT APPOINTMENT DATE:@5 /11/2023  DO WE HAVE YOUR PERMISSION TO LEAVE A DETAILED MESSAGE?: Yes  OTHER COMMENTS:    **Let patient know to contact pharmacy at the end of the day to make sure medication is ready. **  ** Please notify patient to allow 48-72 hours to process**  **Encourage patient to contact the pharmacy for refills or they can request refills through [facility]**

## 2024-02-03 NOTE — Addendum Note (Signed)
 Addended by: Vernon Goodpasture on: 02/03/2024 04:02 PM   Modules accepted: Orders

## 2024-02-07 ENCOUNTER — Ambulatory Visit: Admitting: Podiatry

## 2024-02-11 ENCOUNTER — Ambulatory Visit: Payer: Medicare Other | Admitting: Internal Medicine

## 2024-02-14 ENCOUNTER — Ambulatory Visit (INDEPENDENT_AMBULATORY_CARE_PROVIDER_SITE_OTHER): Admitting: Podiatry

## 2024-02-14 ENCOUNTER — Other Ambulatory Visit

## 2024-02-14 ENCOUNTER — Ambulatory Visit (INDEPENDENT_AMBULATORY_CARE_PROVIDER_SITE_OTHER)

## 2024-02-14 DIAGNOSIS — L97512 Non-pressure chronic ulcer of other part of right foot with fat layer exposed: Secondary | ICD-10-CM

## 2024-02-14 NOTE — Patient Instructions (Addendum)
 Wash foot with soap and water , dry well. Apply a small piece of aquacel to the wound and cover with a bandage.    Monitor for any signs/symptoms of infection. Call the office immediately if any occur or go directly to the emergency room. Call with any questions/concerns.

## 2024-02-14 NOTE — Progress Notes (Unsigned)
aquacel

## 2024-02-21 ENCOUNTER — Encounter: Attending: Physician Assistant | Admitting: Physician Assistant

## 2024-02-21 DIAGNOSIS — I10 Essential (primary) hypertension: Secondary | ICD-10-CM | POA: Diagnosis not present

## 2024-02-21 DIAGNOSIS — L97512 Non-pressure chronic ulcer of other part of right foot with fat layer exposed: Secondary | ICD-10-CM | POA: Insufficient documentation

## 2024-02-21 DIAGNOSIS — E11621 Type 2 diabetes mellitus with foot ulcer: Secondary | ICD-10-CM | POA: Diagnosis present

## 2024-02-21 DIAGNOSIS — E1140 Type 2 diabetes mellitus with diabetic neuropathy, unspecified: Secondary | ICD-10-CM | POA: Diagnosis not present

## 2024-02-21 DIAGNOSIS — I2510 Atherosclerotic heart disease of native coronary artery without angina pectoris: Secondary | ICD-10-CM | POA: Insufficient documentation

## 2024-02-25 ENCOUNTER — Encounter: Payer: Self-pay | Admitting: Podiatry

## 2024-02-28 ENCOUNTER — Encounter: Admitting: Physician Assistant

## 2024-02-28 DIAGNOSIS — E11621 Type 2 diabetes mellitus with foot ulcer: Secondary | ICD-10-CM | POA: Diagnosis not present

## 2024-03-02 ENCOUNTER — Ambulatory Visit: Admitting: Podiatry

## 2024-03-07 ENCOUNTER — Encounter: Admitting: Physician Assistant

## 2024-03-07 DIAGNOSIS — E11621 Type 2 diabetes mellitus with foot ulcer: Secondary | ICD-10-CM | POA: Diagnosis not present

## 2024-03-21 ENCOUNTER — Ambulatory Visit: Admitting: Physician Assistant

## 2024-03-22 ENCOUNTER — Encounter: Attending: Physician Assistant | Admitting: Physician Assistant

## 2024-03-22 DIAGNOSIS — I2510 Atherosclerotic heart disease of native coronary artery without angina pectoris: Secondary | ICD-10-CM | POA: Insufficient documentation

## 2024-03-22 DIAGNOSIS — E1140 Type 2 diabetes mellitus with diabetic neuropathy, unspecified: Secondary | ICD-10-CM | POA: Insufficient documentation

## 2024-03-22 DIAGNOSIS — I10 Essential (primary) hypertension: Secondary | ICD-10-CM | POA: Diagnosis not present

## 2024-03-22 DIAGNOSIS — Z8631 Personal history of diabetic foot ulcer: Secondary | ICD-10-CM | POA: Insufficient documentation

## 2024-03-22 DIAGNOSIS — Z09 Encounter for follow-up examination after completed treatment for conditions other than malignant neoplasm: Secondary | ICD-10-CM | POA: Diagnosis present

## 2024-04-20 ENCOUNTER — Other Ambulatory Visit: Payer: Self-pay | Admitting: Internal Medicine

## 2024-04-20 ENCOUNTER — Other Ambulatory Visit: Payer: Self-pay | Admitting: Cardiovascular Disease

## 2024-04-20 DIAGNOSIS — E1142 Type 2 diabetes mellitus with diabetic polyneuropathy: Secondary | ICD-10-CM

## 2024-04-20 NOTE — Telephone Encounter (Signed)
 Refill request complete

## 2024-04-27 ENCOUNTER — Ambulatory Visit: Admitting: Internal Medicine

## 2024-04-27 ENCOUNTER — Ambulatory Visit: Payer: Self-pay

## 2024-04-27 ENCOUNTER — Encounter: Payer: Self-pay | Admitting: Internal Medicine

## 2024-04-27 VITALS — BP 120/60 | HR 86 | Ht 66.0 in | Wt 256.4 lb

## 2024-04-27 DIAGNOSIS — E785 Hyperlipidemia, unspecified: Secondary | ICD-10-CM | POA: Diagnosis not present

## 2024-04-27 DIAGNOSIS — E1159 Type 2 diabetes mellitus with other circulatory complications: Secondary | ICD-10-CM | POA: Diagnosis not present

## 2024-04-27 DIAGNOSIS — Z7985 Long-term (current) use of injectable non-insulin antidiabetic drugs: Secondary | ICD-10-CM | POA: Diagnosis not present

## 2024-04-27 DIAGNOSIS — E1165 Type 2 diabetes mellitus with hyperglycemia: Secondary | ICD-10-CM

## 2024-04-27 DIAGNOSIS — E1142 Type 2 diabetes mellitus with diabetic polyneuropathy: Secondary | ICD-10-CM

## 2024-04-27 DIAGNOSIS — Z794 Long term (current) use of insulin: Secondary | ICD-10-CM | POA: Diagnosis not present

## 2024-04-27 DIAGNOSIS — Z7984 Long term (current) use of oral hypoglycemic drugs: Secondary | ICD-10-CM

## 2024-04-27 LAB — POCT GLYCOSYLATED HEMOGLOBIN (HGB A1C): Hemoglobin A1C: 8.9 % — AB (ref 4.0–5.6)

## 2024-04-27 MED ORDER — RAMIPRIL 10 MG PO CAPS: 10.0000 mg | ORAL_CAPSULE | Freq: Every day | ORAL | 1 refills | Status: AC

## 2024-04-27 NOTE — Progress Notes (Signed)
 Patient ID: Betty [name], female   DOB: 1955-10-13, 68 y.o.   MRN: [id number]  Betty [name] is a 68 y.o. female, returning for f/u for DM2, dx >10 years ago, insulin -dependent, uncontrolled, with complications (CAD - s/p stent, peripheral neuropathy). Last visit 6.5 months ago.   PCP in [place].  She is still  contemplating moving to [place]  to be closer to family, but not quite now.   Interim history:  She also has generalized aches and pains.  She had problems with her vision. She had cataract surgery last mo. She was off Ozempic  few times and also relaxed his diet since last OV.  Sugars are higher.   Reviewed HbA1c levels: Lab Results  Component Value Date   HGBA1C 7.2 (H) 09/28/2023   HGBA1C 7.8 (A) 06/04/2023   HGBA1C 7.4 (A) 10/01/2022   HGBA1C 7.7 (A) 01/09/2022   HGBA1C 7.9 (A) 07/22/2021   HGBA1C 7.5 (A) 01/09/2021   HGBA1C 7.7 (A) 04/19/2020   HGBA1C 8.0 (A) 09/21/2019   HGBA1C 7.3 (A) 08/22/2018   HGBA1C 7.0 (A) 05/03/2018     Pt is on a regimen of: - Metformin  XR 500 mg 3 >> 2x a day - Victoza  1.2 mg daily >> Ozempic  0.5 >> 1 mg weekly   Breakfast  Lunch   Dinner bedtime  Insulin  N: 20 units -  - 16 >> 20 units  Insulin  R: 15-20 units  15-20 units  15-20 units    -   For sugars: 71-90: take 16 units of R insulin  50-70: do not take R insulin    We tried Bydureon  2 mg weekly (added 08/2015) >> not covered Was previously on 70/30 Insulin : 70-20-30 (split in 3 b/c she was dropping her sugars overnight). On these, she was still having lows in am (40s). She was on Novolog  in the past but could not afford it.  She was also on Amaryl and Avandia. She has bladder dysfxn - cannot try SGLT2 inh.  She had diarrhea with regular metformin .   Meter: Livongo   She checks her blood sugars 1-3x a day - am: 120, 152 >> 85, 101-157, 207, 213 - in the hospital >> 167-189, 215 - 2h after b'fast: 1 190 >> n/c >> 168 >> 130-186 >> 163-190, 233, 276 - before  lunch: 143-213>> 125, 181 >> 115-180 >> 213-298 - 2h after lunch:  140-189 >> n/c >> 92  >> 158-308 >> 133-265 - before dinner: 90-120s, 200 >> 168 >> n/c >> 119-295 >> 120-219 - bedtime:84, 122 >> n/c >> 140 >> n/c >> 132-243 >> 155-226 Lowest CBG: 60 >> ...  85>> 120; she has hypoglycemia awareness in the 80s Highest: 205 >> 308 >> 298   No CKD, last BUN/creatinine was:  Lab Results  Component Value Date   BUN 9 09/30/2023   BUN 9 09/29/2023   Lab Results  Component Value Date   CREATININE 0.65 09/30/2023   CREATININE 0.60 09/29/2023   Lab Results  Component Value Date   MICRALBCREAT 6 06/04/2023  On ramipril .   + HL; Last set of lipids: Lab Results  Component Value Date   CHOL 105 06/04/2023   HDL 46 (L) 06/04/2023   LDLCALC 38 06/04/2023   LDLDIRECT 48 12/11/2019   TRIG 126 06/04/2023   CHOLHDL 2.3 06/04/2023  Prev. on Pravachol  and she is not sure why she is not taking this anymore.  She was on Lipitor >> had mm cramps.   On fish oil.  Now  also on Crestor  10.  She is also on Vascepa  1 g twice a day.   - eye exam: 2024: No DR reportedly, previously + DR + glaucoma, Dr Estelle.  She had cataract surgeries 03/2024.    -+ Numbness and tingling in her feet.  She was previously on Lyrica but had to stop due to somnolence.  She also had somnolence with the 300 mg Neurontin  tablets >> then 100 mg daily >> then stopped -now back on it >> 100 mg 2x a day..  She sees neurology (for  vertigo). She was seen by Dr. Silva for a diabetic foot ulcer 10/2022.  Since then, she developed cellulitis and osteomyelitis in left toes.  She had to have the left hallux amputated 07/31/2023 and the second left toe amputated 09/29/2023.   Last foot exam was 02/14/2024 by Dr. Gershon.   She also has a history of HTN, OSA, obesity, OA - on Osteobiflex, vitamin D deficiency.   She developed severe nausea and vertigo on 04/10/2018. She was on Reglan (per Dr. Aneita) that helped with the nausea >>  now off >> she was out of work because of this for a period of time.  She contacted our office and  I advised her to stop Victoza  but her vertigo did not improve so we restarted the dose up.  Of note, brain MRI was negative for a stroke. She had a stress test with Dr. Delford as she had CP with exertion >> positive >> had a heart cath >> had a stent placed.  She was restarted on a statin afterwards.   ROS: + See HPI   I reviewed pt's medications, allergies, PMH, social hx, family hx, and changes were documented in the history of present illness. Otherwise, unchanged from my initial visit note.   Past Medical History:  Diagnosis Date   Arthritis    CAD, NATIVE VESSEL    a. S/p LAD stent in 2006 b. S/p DES to Lcx 08/2019 and patent LAD stent   DM (diabetes mellitus) (HCC)    Fibromyalgia    HYPERLIPIDEMIA-MIXED    HYPERTENSION, BENIGN    Hypoglycemia 07/29/2023   Neuropathy    Obesity    Osteoarthritis    Sleep apnea    Patient reported on 04/12/13   Vertigo    Past Surgical History:  Procedure Laterality Date   AMPUTATION TOE Left 07/31/2023   Procedure: LEFT HALLUX AMPUTATION;  Surgeon: Gershon Donnice SAUNDERS, DPM;  Location: [facility] ORS;  Service: Orthopedics/Podiatry;  Laterality: Left;   AMPUTATION TOE Left 09/29/2023   Procedure: SECOND TOE AMPUTATION FIRST METATARSAL HEAD RESECTION;  Surgeon: Malvin Marsa FALCON, DPM;  Location: [facility];  Service: Orthopedics/Podiatry;  Laterality: Left;   BREATH TEK H PYLORI N/A 03/08/2013   Procedure: BREATH TEK H PYLORI;  Surgeon: Camellia CHRISTELLA Blush, MD;  Location: [facility];  Service: General;  Laterality: N/A;   CESAREAN SECTION     CHOLECYSTECTOMY  1996   CORONARY ANGIOPLASTY WITH STENT PLACEMENT  11/2004   DES - LAD   CORONARY STENT INTERVENTION N/A 09/06/2019   Procedure: CORONARY STENT INTERVENTION;  Surgeon: Wonda Sharper, MD;  Location: [facility] INVASIVE CV LAB;  Service: Cardiovascular;  Laterality: N/A;   CORONARY STENT PLACEMENT  11/2004    LOWER EXTREMITY ANGIOGRAPHY Left 08/02/2023   Procedure: Lower Extremity Angiography;  Surgeon: Marea Selinda RAMAN, MD;  Location: [facility] INVASIVE CV LAB;  Service: Cardiovascular;  Laterality: Left;   RIGHT/LEFT HEART CATH AND CORONARY ANGIOGRAPHY N/A 09/06/2019  Procedure: RIGHT/LEFT HEART CATH AND CORONARY ANGIOGRAPHY;  Surgeon: Wonda Sharper, MD;  Location: [facility] INVASIVE CV LAB;  Service: Cardiovascular;  Laterality: N/A;   Social History   Socioeconomic History   Marital status: Divorced    Spouse name: Not on file   Number of children: 2   Years of education: 12   Highest education level: High school graduate  Occupational History   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   Right-handed.   Caffeine use:  She will sometimes drink up to three 20-oz bottles of soda per day.  Drinks 0.5 bottles of a 5-hour energy drink daily.   Social Drivers of Corporate investment banker Strain: Low Risk  (10/15/2023)   Overall Financial Resource Strain (CARDIA)    Difficulty of Paying Living Expenses: Not very hard  Food Insecurity: Food Insecurity Present (10/15/2023)   Hunger Vital Sign    Worried About Running Out of Food in the Last Year: Sometimes true    Ran Out of Food in the Last Year: Never true  Transportation Needs: Unmet Transportation Needs (10/15/2023)   PRAPARE - Administrator, Civil Service (Medical): Yes    Lack of Transportation (Non-Medical): Yes  Physical Activity: Inactive (10/15/2023)   Exercise Vital Sign    Days of Exercise per Week: 0 days    Minutes of Exercise per Session: 0 min  Stress: No Stress Concern Present (10/15/2023)   [employer]  of Occupational Health - Occupational Stress Questionnaire    Feeling of Stress : Only a little  Social Connections: Socially Isolated (10/15/2023)   Social Connection and Isolation Panel    Frequency of Communication with Friends and Family: More than three times a week    Frequency of Social Gatherings with Friends and Family: Never    Attends Religious Services: Never    Database administrator or Organizations: No    Attends Banker Meetings: Never    Marital Status: Divorced  Catering manager Violence: Not At Risk (10/15/2023)   Humiliation, Afraid, Rape, and Kick questionnaire    Fear of Current or Ex-Partner: No    Emotionally Abused: No    Physically Abused: No    Sexually Abused: No   Current Outpatient Medications on File Prior to Visit  Medication Sig Dispense Refill   acetaminophen  (TYLENOL ) 650 MG CR tablet Take 650-1,300 mg by mouth every 8 (eight) hours as needed for pain.      aspirin  EC 81 MG tablet Take 81 mg by mouth daily.     benzonatate  (TESSALON ) 100 MG capsule Take 1 capsule (100 mg total) by mouth 3 (three) times daily as needed for cough. 30 capsule 0   beta carotene 25000 UNIT capsule Take 25,000 Units by mouth daily.       carvedilol  (COREG ) 3.125 MG tablet Take 1 tablet (3.125 mg total) by mouth 2 (two) times daily with a meal. 180 tablet 2   cephALEXin  (KEFLEX ) 500 MG capsule Take 1 capsule (500 mg total) by mouth 3 (three) times daily. 21 capsule 0   Cholecalciferol (VITAMIN D3) 250 MCG (10000  UT) capsule Take 10,000 Units by mouth 2 (two) times daily.     clopidogrel  (PLAVIX ) 75 MG tablet TAKE 1 TABLET BY MOUTH EVERY DAY 90 tablet 1   Continuous Glucose Sensor (FREESTYLE LIBRE 3 SENSOR) MISC 1 each by Does not apply route every 14 (fourteen) days. 6 each 3   fluticasone  (FLONASE ) 50 MCG/ACT nasal spray Place 2 sprays into both nostrils daily. 16 g 0   gabapentin  (NEURONTIN ) 100 MG capsule TAKE 1 CAPSULE BY MOUTH TWICE A DAY 180 capsule 0    Homeopathic Products (THERAWORX RELIEF EX) Apply 1 application topically 3 (three) times daily as needed (pain.).     icosapent  Ethyl (VASCEPA ) 1 g capsule Take 2 capsules (2 g total) by mouth 2 (two) times daily. (Patient taking differently: Take 1 g by mouth 2 (two) times daily.) 360 capsule 3   insulin  NPH Human (NOVOLIN  N) 100 UNIT/ML injection INJECT 8 UNITS SUBCUTANEOUSLY TWO TIMES A DAY (Patient taking differently: Inject into the skin. Inject 20 units in the morning anmd 16 - 18 units in the evening)     insulin  regular (NOVOLIN  R) 100 units/mL injection INJECT 5 units into the skin THREE TIMES daily (Patient taking differently: Inject into the skin. Injection 16 - 20  units 2 times per day)     Insulin  Syringe-Needle U-100 (INSULIN  SYRINGE .5CC/31GX5/16) 31G X 5/16 0.5 ML MISC Use to inject insulin  4 x a day 400 each 5   latanoprost  (XALATAN ) 0.005 % ophthalmic solution Apply to eye.     linezolid  (ZYVOX ) 600 MG tablet Take 1 tablet (600 mg total) by mouth 2 (two) times daily. 14 tablet 0   Magnesium Oxide -Mg Supplement ([employer] MAGNESIUM OXIDE) 250 MG TABS Take by mouth.     Menthol, Topical Analgesic, (ZIMS MAX-FREEZE EX) Apply 1 application topically 4 (four) times daily as needed (pain).     metFORMIN  (GLUCOPHAGE -XR) 500 MG 24 hr tablet TAKE 1 TABLET BY MOUTH 3 TIMES DAILY WITH MEALS 270 tablet 2   Misc Natural Products (OSTEO BI-FLEX JOINT SHIELD PO) Take 1 tablet by mouth 2 (two) times daily.     MYRBETRIQ  25 MG TB24 tablet Take 25 mg by mouth daily. 30 tablet 3   nitroGLYCERIN  (NITROSTAT ) 0.4 MG SL tablet Place 1 tablet (0.4 mg total) under the tongue daily as needed for chest pain. 25 tablet 3   OZEMPIC , 1 MG/DOSE, 4 MG/3ML SOPN INJECT 1MG  SUBCUTANEOUSLY  ONCE A WEEK (Patient taking differently: Inject 1 mg into the skin once a week. INJECT 1MG  SUBCUTANEOUSLY  ONCE A WEEK ON SUNDAYS) 9 mL 3   ramipril  (ALTACE ) 10 MG capsule Take 1 capsule (10 mg total) by mouth daily. 90 capsule 1    rosuvastatin  (CRESTOR ) 10 MG tablet TAKE 1 TABLET DAILY 90 tablet 3   silver  sulfADIAZINE  (SILVADENE ) 1 % cream Apply 1 Application topically daily. 50 g 0   ULTRATRAK PRO TEST test strip TEST BLOOD SUGAR 5 TIMES DAILY AS INSTRUCTED 450 each 1   No current facility-administered medications on file prior to visit.   Allergies  Allergen Reactions   Lyrica [Pregabalin] Other (See Comments)    sleepy   Tape Rash   Family History  Problem Relation Age of Onset   Heart disease Mother    Cancer Mother        ovarian   Arthritis Mother    Diabetes Mother    Hypertension Mother    Hypertension Father    Stroke Father  Diabetes Sister    Cancer Other    Coronary artery disease Other    Diabetes Other    PE: BP 120/60   Pulse 86   Ht 5' 6 (1.676 m)   Wt 256 lb 6.4 oz (116.3 kg)   SpO2 94%   BMI 41.38 kg/m  Wt Readings from Last 3 Encounters:  04/27/24 256 lb 6.4 oz (116.3 kg)  11/11/23 255 lb 9.6 oz (115.9 kg)  10/14/23 254 lb 9.6 oz (115.5 kg)    Constitutional: overweight, in NAD Eyes: no exophthalmos ENT: no masses palpated in neck, no cervical lymphadenopathy Cardiovascular: RRR, No MRG Respiratory: CTA B Musculoskeletal: no deformities other than 2 left toe amputations -left foot in boot Skin: no rashes Neurological: no tremor with outstretched hands  ASSESSMENT: 1. DM2, insulin -dependent, uncontrolled, with complications - CAD - s/p stent 11/2004 - Dr Delford - peripheral neuropathy - DR She told me that her previous endocrinologist filled out FMLA forms 2/2 uncontrolled DM2, but I could not fill this since no major highs or lows or other issues.    2. HL  3.  PN - sees podiatry and neurology -She previously had somnolence with a higher dose of Neurontin , 300 mg daily, then decrease the dose to 100 mg twice a day -We previously discussed about possible side effects of Neurontin  and advised her to stay on the minimum dose possible   PLAN:  1. DM2 -Patient  with longstanding insulin -dependent type 2 diabetes, on the basal-bolus insulin  regimen along with a weekly GLP-1 receptor agonist and metformin , with improved control after switching from Victoza  to Ozempic .  She was off Ozempic  at last visit and we discussed about starting it at a low dose and increasing as tolerated.  We continued metformin  and the insulin  doses.  She was off the CGM and we discussed about restarting it.  She had problems keeping the sensor on in the past and we discussed about measures to prevent the sensor from coming off and the best place to attach it.  Her HbA1c was lower, at 7.2%, the best in few years. - she is not on the CGM now, as she mentions that this was coming off.  I recommended the Lexcam patches to put on top of the sensor. - At today's visit, sugars are higher, after she relaxed her diet and was also off the Ozempic  for her cataract surgeries.  We discussed about returning to the previous diet and reducing concentrated sweets but will also increase her NPH in the morning to improve her midday blood sugars and we discussed that if the sugars remain elevated in the morning, afterwards, to also increase her NPH at night.  For now, I feel that her regular insulin  doses are adequate. - I advised her to: Patient Instructions  Please continue: - Metformin  XR 500 mg 2x a day - Ozempic  1 mg weekly   Breakfast  Lunch   Dinner bedtime  Insulin  N: 20 >> 26 units -  - 16 (may need 20) units  Insulin  R: 15-20 units  15-20 units  15-20 units    -  For sugars: 71-90: take 8 units of R insulin  50-70: do not take R insulin   Try the Lexcam cover for the sensor ([employer]).  Please return in 4 months.   - we checked her HbA1c: 8.9% (higher) - advised to check sugars at different times of the day - 4x a day, rotating check times - advised for yearly eye exams >> she  is UTD - will check an ACR today - return to clinic in 4 months   2.HL - Latest lipid panel was reviewed: At  goal except for slightly low HDL. Lab Results  Component Value Date   CHOL 105 06/04/2023   HDL 46 (L) 06/04/2023   LDLCALC 38 06/04/2023   LDLDIRECT 48 12/11/2019   TRIG 126 06/04/2023   CHOLHDL 2.3 06/04/2023  - On Crestor  10 mg daily and Vascepa  1 g twice a day without side effects.  She had muscle aches with Lipitor.   Lela Fendt, MD PhD [facility]

## 2024-04-27 NOTE — Patient Instructions (Addendum)
 Please continue: - Metformin  XR 500 mg 2x a day - Ozempic  1 mg weekly   Breakfast  Lunch   Dinner bedtime  Insulin  N: 20 >> 26 units -  - 16 (may need 20) units  Insulin  R: 15-20 units  15-20 units  15-20 units    -  For sugars: 71-90: take 8 units of R insulin  50-70: do not take R insulin   Try the Lexcam cover for the sensor ([employer]).  Please return in 4 months.

## 2024-04-28 LAB — MICROALBUMIN / CREATININE URINE RATIO
Creatinine, Urine: 106 mg/dL (ref 20–275)
Microalb Creat Ratio: 9 mg/g{creat} (ref <30)
Microalb, Ur: 1 mg/dL

## 2024-05-04 NOTE — Telephone Encounter (Signed)
 Error

## 2024-06-23 ENCOUNTER — Other Ambulatory Visit: Payer: Self-pay | Admitting: Internal Medicine

## 2024-07-06 ENCOUNTER — Ambulatory Visit: Admitting: Podiatry

## 2024-07-24 ENCOUNTER — Telehealth: Payer: Self-pay | Admitting: Cardiovascular Disease

## 2024-07-24 MED ORDER — ICOSAPENT ETHYL 1 G PO CAPS: 2.0000 g | ORAL_CAPSULE | Freq: Two times a day (BID) | ORAL | 0 refills | Status: DC

## 2024-07-24 NOTE — Telephone Encounter (Signed)
*  STAT* If patient is at the pharmacy, call can be transferred to refill team.   1. Which medications need to be refilled? (please list name of each medication and dose if known)   icosapent  Ethyl (VASCEPA ) 1 g capsule   2. Would you like to learn more about the convenience, safety, & potential cost savings by using the [facility]?   3. Are you open to using the [facility] (Type [facility]. ).  4. Which pharmacy/location (including street and city if local pharmacy) is medication to be sent to?  [facility] - [place], [place] - [address] [facility] AT Portal to Registered [facility] Sites   5. Do they need a 30 day or 90 day supply?   90 day  Patient stated she still has some medication.  Patient has appointment scheduled on 11/15/24.

## 2024-07-24 NOTE — Telephone Encounter (Signed)
 Pt's medication was sent to pt's pharmacy as requested. Confirmation received.

## 2024-08-03 ENCOUNTER — Ambulatory Visit: Admitting: Podiatry

## 2024-08-28 ENCOUNTER — Ambulatory Visit: Admitting: Podiatry

## 2024-08-29 ENCOUNTER — Ambulatory Visit: Admitting: Internal Medicine

## 2024-09-01 ENCOUNTER — Ambulatory Visit: Admitting: Internal Medicine

## 2024-09-01 NOTE — Progress Notes (Deleted)
 Patient ID: Betty [name], female   DOB: 1956-07-04, 68 y.o.   MRN: [id number]  Betty [name] is a 68 y.o. female, returning for f/u for DM2, dx >10 years ago, insulin -dependent, uncontrolled, with complications (CAD - s/p stent, peripheral neuropathy). Last visit 4 months ago.   PCP in [place]. She is still  contemplating moving to [place]  to be closer to family, but not quite now.   Interim history:  She  has generalized aches and pains.    Reviewed HbA1c levels: Lab Results  Component Value Date   HGBA1C 8.9 (A) 04/27/2024   HGBA1C 7.2 (H) 09/28/2023   HGBA1C 7.8 (A) 06/04/2023   HGBA1C 7.4 (A) 10/01/2022   HGBA1C 7.7 (A) 01/09/2022   HGBA1C 7.9 (A) 07/22/2021   HGBA1C 7.5 (A) 01/09/2021   HGBA1C 7.7 (A) 04/19/2020   HGBA1C 8.0 (A) 09/21/2019   HGBA1C 7.3 (A) 08/22/2018     Pt is on a regimen of: - Metformin  XR 500 mg 3 >> 2x a day - Victoza  1.2 mg daily >> Ozempic  0.5 >> 1 mg weekly   Breakfast  Lunch   Dinner bedtime  Insulin  N: 20 >> 26 units -  - 16 (may need 20) units  Insulin  R: 15-20 units  15-20 units  15-20 units    -   For sugars: 71-90: take 16 units of R insulin  50-70: do not take R insulin    We tried Bydureon  2 mg weekly (added 08/2015) >> not covered Was previously on 70/30 Insulin : 70-20-30 (split in 3 b/c she was dropping her sugars overnight). On these, she was still having lows in am (40s). She was on Novolog  in the past but could not afford it.  She was also on Amaryl and Avandia. She has bladder dysfxn - cannot try SGLT2 inh.  She had diarrhea with regular metformin .   Meter: Livongo   She checks her blood sugars 1-3x a day - am: 120, 152 >> 85, 101-157, 207, 213 - in the hospital >> 167-189, 215 - 2h after b'fast: 1 190 >> n/c >> 168 >> 130-186 >> 163-190, 233, 276 - before lunch: 143-213>> 125, 181 >> 115-180 >> 213-298 - 2h after lunch:  140-189 >> n/c >> 92  >> 158-308 >> 133-265 - before dinner: 90-120s, 200 >> 168 >> n/c >>  119-295 >> 120-219 - bedtime:84, 122 >> n/c >> 140 >> n/c >> 132-243 >> 155-226 Lowest CBG: 60 >> ...  85>> 120; she has hypoglycemia awareness in the 80s Highest: 205 >> 308 >> 298   No CKD, last BUN/creatinine was:  Lab Results  Component Value Date   BUN 9 09/30/2023   BUN 9 09/29/2023   Lab Results  Component Value Date   CREATININE 0.65 09/30/2023   CREATININE 0.60 09/29/2023   Lab Results  Component Value Date   MICRALBCREAT 9 04/27/2024   MICRALBCREAT 6 06/04/2023  On ramipril .   + HL; Last set of lipids: Lab Results  Component Value Date   CHOL 105 06/04/2023   HDL 46 (L) 06/04/2023   LDLCALC 38 06/04/2023   LDLDIRECT 48 12/11/2019   TRIG 126 06/04/2023   CHOLHDL 2.3 06/04/2023  Prev. on Pravachol  and she is not sure why she is not taking this anymore.  She was on Lipitor >> had mm cramps.   On fish oil.  Now also on Crestor  10.  She is also on Vascepa  1 g twice a day.   - eye exam: 2024: No DR  reportedly, previously + DR + glaucoma, Dr Estelle.  She had cataract surgeries 03/2024.    -+ Numbness and tingling in her feet.  She was previously on Lyrica but had to stop due to somnolence.  She also had somnolence with the 300 mg Neurontin  tablets >> then 100 mg daily >> then stopped -now back on it >> 100 mg 2x a day..  She sees neurology (for  vertigo). She was seen by Dr. Silva for a diabetic foot ulcer 10/2022.  Since then, she developed cellulitis and osteomyelitis in left toes.  She had to have the left hallux amputated 07/31/2023 and the second left toe amputated 09/29/2023.   Last foot exam was 02/14/2024 by Dr. Gershon.   She also has a history of HTN, OSA, obesity, OA - on Osteobiflex, vitamin D deficiency.   She developed severe nausea and vertigo on 04/10/2018. She was on Reglan (per Dr. Aneita) that helped with the nausea >> now off >> she was out of work because of this for a period of time.  She contacted our office and  I advised her to stop Victoza  but  her vertigo did not improve so we restarted the dose up.  Of note, brain MRI was negative for a stroke. She had a stress test with Dr. Delford as she had CP with exertion >> positive >> had a heart cath >> had a stent placed.  She was restarted on a statin afterwards.   ROS: + See HPI   I reviewed pt's medications, allergies, PMH, social hx, family hx, and changes were documented in the history of present illness. Otherwise, unchanged from my initial visit note.   Past Medical History:  Diagnosis Date   Arthritis    CAD, NATIVE VESSEL    a. S/p LAD stent in 2006 b. S/p DES to Lcx 08/2019 and patent LAD stent   DM (diabetes mellitus) (HCC)    Fibromyalgia    HYPERLIPIDEMIA-MIXED    HYPERTENSION, BENIGN    Hypoglycemia 07/29/2023   Neuropathy    Obesity    Osteoarthritis    Sleep apnea    Patient reported on 04/12/13   Vertigo    Past Surgical History:  Procedure Laterality Date   AMPUTATION TOE Left 07/31/2023   Procedure: LEFT HALLUX AMPUTATION;  Surgeon: Gershon Donnice SAUNDERS, DPM;  Location: [facility] ORS;  Service: Orthopedics/Podiatry;  Laterality: Left;   AMPUTATION TOE Left 09/29/2023   Procedure: SECOND TOE AMPUTATION FIRST METATARSAL HEAD RESECTION;  Surgeon: Malvin Marsa FALCON, DPM;  Location: [facility] OR;  Service: Orthopedics/Podiatry;  Laterality: Left;   BREATH TEK H PYLORI N/A 03/08/2013   Procedure: BREATH TEK H PYLORI;  Surgeon: Camellia CHRISTELLA Blush, MD;  Location: [facility];  Service: General;  Laterality: N/A;   CESAREAN SECTION     CHOLECYSTECTOMY  1996   CORONARY ANGIOPLASTY WITH STENT PLACEMENT  11/2004   DES - LAD   CORONARY STENT INTERVENTION N/A 09/06/2019   Procedure: CORONARY STENT INTERVENTION;  Surgeon: Wonda Sharper, MD;  Location: [facility] INVASIVE CV LAB;  Service: Cardiovascular;  Laterality: N/A;   CORONARY STENT PLACEMENT  11/2004   LOWER EXTREMITY ANGIOGRAPHY Left 08/02/2023   Procedure: Lower Extremity Angiography;  Surgeon: Marea Selinda RAMAN, MD;  Location: [facility]  INVASIVE CV LAB;  Service: Cardiovascular;  Laterality: Left;   RIGHT/LEFT HEART CATH AND CORONARY ANGIOGRAPHY N/A 09/06/2019   Procedure: RIGHT/LEFT HEART CATH AND CORONARY ANGIOGRAPHY;  Surgeon: Wonda Sharper, MD;  Location: [facility] INVASIVE CV LAB;  Service: Cardiovascular;  Laterality: N/A;   Social History   Socioeconomic History   Marital status: Divorced    Spouse name: Not on file   Number of children: 2   Years of education: 12   Highest education level: High school graduate  Occupational History   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   Right-handed.   Caffeine use:  She will sometimes drink up to three 20-oz bottles of soda per day.  Drinks 0.5 bottles of a 5-hour energy drink daily.   Social Drivers of Corporate Investment Banker Strain: Low Risk  (10/15/2023)   Overall Financial Resource Strain (CARDIA)    Difficulty of Paying Living Expenses: Not very hard  Food Insecurity: Food Insecurity Present (10/15/2023)   Hunger Vital Sign    Worried About Running Out of Food in the Last Year: Sometimes true    Ran Out of Food in the Last Year: Never true  Transportation Needs: Unmet Transportation Needs (10/15/2023)   PRAPARE - Administrator, Civil Service (Medical): Yes    Lack of Transportation (Non-Medical): Yes  Physical Activity: Inactive (10/15/2023)   Exercise Vital Sign    Days of Exercise per Week: 0 days    Minutes of Exercise per Session: 0 min  Stress: No Stress Concern Present (10/15/2023)   [employer] of Occupational Health - Occupational Stress Questionnaire    Feeling of Stress : Only a little  Social Connections: Socially  Isolated (10/15/2023)   Social Connection and Isolation Panel    Frequency of Communication with Friends and Family: More than three times a week    Frequency of Social Gatherings with Friends and Family: Never    Attends Religious Services: Never    Database Administrator or Organizations: No    Attends Banker Meetings: Never    Marital Status: Divorced  Catering Manager Violence: Not At Risk (10/15/2023)   Humiliation, Afraid, Rape, and Kick questionnaire    Fear of Current or Ex-Partner: No    Emotionally Abused: No    Physically Abused: No    Sexually Abused: No   Current Outpatient Medications on File Prior to Visit  Medication Sig Dispense Refill   acetaminophen  (TYLENOL ) 650 MG CR tablet Take 650-1,300 mg by mouth every 8 (eight) hours as needed for pain.      aspirin  EC 81 MG tablet Take 81 mg by mouth daily.     benzonatate  (TESSALON ) 100 MG capsule Take 1 capsule (100 mg total) by mouth 3 (three) times daily as needed for cough. 30 capsule 0   beta carotene 25000 UNIT capsule Take 25,000 Units by mouth daily.       carvedilol  (COREG ) 3.125 MG tablet Take 1 tablet (3.125 mg total) by mouth 2 (two) times daily with a meal. 180 tablet 2   Cholecalciferol (VITAMIN D3) 250 MCG (10000 UT) capsule Take 10,000 Units by mouth 2 (two) times daily.     clopidogrel  (PLAVIX ) 75 MG tablet TAKE 1 TABLET BY MOUTH EVERY DAY 90 tablet 1   Continuous Glucose Sensor (FREESTYLE LIBRE 3 SENSOR) MISC 1 each by Does  not apply route every 14 (fourteen) days. 6 each 3   fluticasone  (FLONASE ) 50 MCG/ACT nasal spray Place 2 sprays into both nostrils daily. 16 g 0   gabapentin  (NEURONTIN ) 100 MG capsule TAKE 1 CAPSULE BY MOUTH TWICE A DAY 180 capsule 0   Homeopathic Products (THERAWORX RELIEF EX) Apply 1 application topically 3 (three) times daily as needed (pain.).     icosapent  Ethyl (VASCEPA ) 1 g capsule Take 2 capsules (2 g total) by mouth 2 (two) times daily. 360 capsule 0   insulin  NPH  Human (NOVOLIN  N) 100 UNIT/ML injection INJECT 8 UNITS SUBCUTANEOUSLY TWO TIMES A DAY (Patient taking differently: Inject into the skin. Inject 20 units in the morning anmd 16 - 18 units in the evening)     insulin  regular (NOVOLIN  R) 100 units/mL injection INJECT 5 units into the skin THREE TIMES daily (Patient taking differently: Inject into the skin. Injection 16 - 20  units 2 times per day)     Insulin  Syringe-Needle U-100 (INSULIN  SYRINGE .5CC/31GX5/16) 31G X 5/16 0.5 ML MISC Use to inject insulin  4 x a day 400 each 5   latanoprost  (XALATAN ) 0.005 % ophthalmic solution Apply to eye.     linezolid  (ZYVOX ) 600 MG tablet Take 1 tablet (600 mg total) by mouth 2 (two) times daily. 14 tablet 0   Magnesium Oxide -Mg Supplement ([employer] MAGNESIUM OXIDE) 250 MG TABS Take by mouth.     Menthol, Topical Analgesic, (ZIMS MAX-FREEZE EX) Apply 1 application topically 4 (four) times daily as needed (pain).     metFORMIN  (GLUCOPHAGE -XR) 500 MG 24 hr tablet TAKE 1 TABLET BY MOUTH 3 TIMES DAILY WITH MEALS 270 tablet 2   Misc Natural Products (OSTEO BI-FLEX JOINT SHIELD PO) Take 1 tablet by mouth 2 (two) times daily.     MYRBETRIQ  25 MG TB24 tablet Take 25 mg by mouth daily. 30 tablet 3   nitroGLYCERIN  (NITROSTAT ) 0.4 MG SL tablet Place 1 tablet (0.4 mg total) under the tongue daily as needed for chest pain. 25 tablet 3   ramipril  (ALTACE ) 10 MG capsule Take 1 capsule (10 mg total) by mouth daily. 90 capsule 1   rosuvastatin  (CRESTOR ) 10 MG tablet TAKE 1 TABLET DAILY 90 tablet 3   Semaglutide , 1 MG/DOSE, (OZEMPIC , 1 MG/DOSE,) 4 MG/3ML SOPN INJECT 1MG  SUBCUTANEOUSLY  ONCE WEEKLY (EVERY 7 DAYS) 9 mL 1   silver  sulfADIAZINE  (SILVADENE ) 1 % cream Apply 1 Application topically daily. 50 g 0   ULTRATRAK PRO TEST test strip TEST BLOOD SUGAR 5 TIMES DAILY AS INSTRUCTED 450 each 1   No current facility-administered medications on file prior to visit.   Allergies  Allergen Reactions   Lyrica [Pregabalin] Other (See  Comments)    sleepy   Tape Rash   Family History  Problem Relation Age of Onset   Heart disease Mother    Cancer Mother        ovarian   Arthritis Mother    Diabetes Mother    Hypertension Mother    Hypertension Father    Stroke Father    Diabetes Sister    Cancer Other    Coronary artery disease Other    Diabetes Other    PE: There were no vitals taken for this visit. Wt Readings from Last 3 Encounters:  04/27/24 256 lb 6.4 oz (116.3 kg)  11/11/23 255 lb 9.6 oz (115.9 kg)  10/14/23 254 lb 9.6 oz (115.5 kg)    Constitutional: overweight, in NAD Eyes: no exophthalmos  ENT: no masses palpated in neck, no cervical lymphadenopathy Cardiovascular: RRR, No MRG Respiratory: CTA B Musculoskeletal: no deformities other than 2 left toe amputations Skin: no rashes Neurological: no tremor with outstretched hands  ASSESSMENT: 1. DM2, insulin -dependent, uncontrolled, with complications - CAD - s/p stent 11/2004 - Dr Delford - peripheral neuropathy - DR She told me that her previous endocrinologist filled out FMLA forms 2/2 uncontrolled DM2, but I could not fill this since no major highs or lows or other issues.    2. HL  3.  PN - sees podiatry and neurology -She previously had somnolence with a higher dose of Neurontin , 300 mg daily, then decrease the dose to 100 mg twice a day -We previously discussed about possible side effects of Neurontin  and advised her to stay on the minimum dose possible   PLAN:  1. DM2 -Patient with longstanding, insulin -dependent type 2 diabetes, on a basal/bolus insulin  regimen along with weekly GLP-1 receptor agonist and metformin , with improved control after switching from Victoza  to Ozempic .  At last visit HbA1c was higher, at 8.9% after coming off the sensor, falling off the wagon with her diet, and being off Ozempic  for cataract surgeries.  We discussed about increasing her insulin  with breakfast and at bedtime.  I also recommended a Lexcam patches  to put on top of the sensor to keep it in place  - I advised her to: Patient Instructions  Please continue: - Metformin  XR 500 mg 2x a day - Ozempic  1 mg weekly   Breakfast  Lunch   Dinner bedtime  Insulin  N: 26 units -  - 16-20  units  Insulin  R: 15-20 units  15-20 units  15-20 units    -  For sugars: 71-90: take 8 units of R insulin  50-70: do not take R insulin   Try the Lexcam cover for the sensor ([employer]).  Please return in 4 months.   - we checked her HbA1c: 7%  - advised to check sugars at different times of the day - 4x a day, rotating check times - advised for yearly eye exams >> she is UTD - return to clinic in 4 months   2.HL - Latest lipid panel was reviewed: At goal except for slightly low HDL. Lab Results  Component Value Date   CHOL 105 06/04/2023   HDL 46 (L) 06/04/2023   LDLCALC 38 06/04/2023   LDLDIRECT 48 12/11/2019   TRIG 126 06/04/2023   CHOLHDL 2.3 06/04/2023  - On Crestor  10 mg daily and Vascepa  1 g twice a day without side effects.  She had muscle aches with Lipitor.   Lela Fendt, MD PhD [facility] Endocrinology

## 2024-09-05 ENCOUNTER — Ambulatory Visit: Admitting: Internal Medicine

## 2024-09-05 ENCOUNTER — Other Ambulatory Visit

## 2024-09-05 ENCOUNTER — Encounter: Payer: Self-pay | Admitting: Internal Medicine

## 2024-09-05 VITALS — BP 122/80 | HR 88 | Ht 66.0 in | Wt 261.2 lb

## 2024-09-05 DIAGNOSIS — E785 Hyperlipidemia, unspecified: Secondary | ICD-10-CM

## 2024-09-05 DIAGNOSIS — Z7985 Long-term (current) use of injectable non-insulin antidiabetic drugs: Secondary | ICD-10-CM | POA: Diagnosis not present

## 2024-09-05 DIAGNOSIS — E1159 Type 2 diabetes mellitus with other circulatory complications: Secondary | ICD-10-CM | POA: Diagnosis not present

## 2024-09-05 DIAGNOSIS — E1142 Type 2 diabetes mellitus with diabetic polyneuropathy: Secondary | ICD-10-CM

## 2024-09-05 DIAGNOSIS — Z7984 Long term (current) use of oral hypoglycemic drugs: Secondary | ICD-10-CM

## 2024-09-05 DIAGNOSIS — E1165 Type 2 diabetes mellitus with hyperglycemia: Secondary | ICD-10-CM

## 2024-09-05 LAB — COMPREHENSIVE METABOLIC PANEL WITH GFR
AG Ratio: 1.5 (calc) (ref 1.0–2.5)
ALT: 17 U/L (ref 6–29)
AST: 14 U/L (ref 10–35)
Albumin: 4.3 g/dL (ref 3.6–5.1)
Alkaline phosphatase (APISO): 85 U/L (ref 37–153)
BUN: 25 mg/dL (ref 7–25)
CO2: 27 mmol/L (ref 20–32)
Calcium: 9.9 mg/dL (ref 8.6–10.4)
Chloride: 99 mmol/L (ref 98–110)
Creat: 0.6 mg/dL (ref 0.50–1.05)
Globulin: 2.9 g/dL (ref 1.9–3.7)
Glucose, Bld: 236 mg/dL — ABNORMAL HIGH (ref 65–99)
Potassium: 4.2 mmol/L (ref 3.5–5.3)
Sodium: 138 mmol/L (ref 135–146)
Total Bilirubin: 0.7 mg/dL (ref 0.2–1.2)
Total Protein: 7.2 g/dL (ref 6.1–8.1)
eGFR: 97 mL/min/1.73m2 (ref >=60)

## 2024-09-05 LAB — POCT GLYCOSYLATED HEMOGLOBIN (HGB A1C): Hemoglobin A1C: 8.4 % — AB (ref 4.0–5.6)

## 2024-09-05 LAB — LIPID PANEL W/REFLEX DIRECT LDL
Cholesterol: 110 mg/dL (ref <200)
HDL: 47 mg/dL — ABNORMAL LOW (ref >=50)
LDL Cholesterol (Calc): 25 mg/dL
Non-HDL Cholesterol (Calc): 63 mg/dL (ref <130)
Total CHOL/HDL Ratio: 2.3 (calc) (ref <5.0)
Triglycerides: 360 mg/dL — ABNORMAL HIGH (ref <150)

## 2024-09-05 NOTE — Progress Notes (Signed)
 Patient ID: Betty [name], female   DOB: October 20, 1955, 68 y.o.   MRN: [id number] This note was precharted 09/01/2024.  Betty [name] is a 68 y.o. female, returning for f/u for DM2, dx >10 years ago, insulin -dependent, uncontrolled, with complications (CAD - s/p stent, peripheral neuropathy). Last visit 4 months ago.   PCP in [place]. She is still  contemplating moving to [place]  to be closer to family, but not quite now.   Interim history:  She  has generalized aches and pains. She had diarrhea recently >> resolved.  Was recently started on protein shakes.  She has these twice a day.   Reviewed HbA1c levels: Lab Results  Component Value Date   HGBA1C 8.9 (A) 04/27/2024   HGBA1C 7.2 (H) 09/28/2023   HGBA1C 7.8 (A) 06/04/2023   HGBA1C 7.4 (A) 10/01/2022   HGBA1C 7.7 (A) 01/09/2022   HGBA1C 7.9 (A) 07/22/2021   HGBA1C 7.5 (A) 01/09/2021   HGBA1C 7.7 (A) 04/19/2020   HGBA1C 8.0 (A) 09/21/2019   HGBA1C 7.3 (A) 08/22/2018     Pt is on a regimen of: - Metformin  XR 500 mg 3 >> 2x a day - Victoza  1.2 mg daily >> Ozempic  0.5 >> 1 mg weekly   Breakfast  Lunch   Dinner bedtime  Insulin  N: 20 >> 26 units -  - 20 units  Insulin  R: 15-20 units  15-20 units  15-20 units    -   For sugars: 71-90: take 16 units of R insulin  50-70: do not take R insulin    We tried Bydureon  2 mg weekly (added 08/2015) >> not covered Was previously on 70/30 Insulin : 70-20-30 (split in 3 b/c she was dropping her sugars overnight). On these, she was still having lows in am (40s). She was on Novolog  in the past but could not afford it.  She was also on Amaryl and Avandia. She has bladder dysfxn - cannot try SGLT2 inh.  She had diarrhea with regular metformin .   Meter: Livongo   She checks her blood sugars 1-3x a day - am: 85, 101-157, 207, 213 >> 167-189, 215 >> 87, 146-241 - 2h after b'fast:  168 >> 130-186 >> 163-190, 233, 276 >> 176-232 - before lunch: 125, 181 >> 115-180 >> 213-298 >>  178-232 - 2h after lunch: n/c >> 92  >> 158-308 >> 133-265 >> 165, 274 - before dinner: 168 >> n/c >> 119-295 >> 120-219 >> 139-184 - bedtime: 140 >> n/c >> 132-243 >> 155-226 >> n/c Lowest CBG: 60 >> ...  85 >> 120 >> 87; she has hypoglycemia awareness in the 80s Highest: 205 >> 308 >> 298 >> 274   No CKD, last BUN/creatinine was:  Lab Results  Component Value Date   BUN 9 09/30/2023   BUN 9 09/29/2023   Lab Results  Component Value Date   CREATININE 0.65 09/30/2023   CREATININE 0.60 09/29/2023   Lab Results  Component Value Date   MICRALBCREAT 9 04/27/2024   MICRALBCREAT 6 06/04/2023  On ramipril .   + HL; Last set of lipids: Lab Results  Component Value Date   CHOL 105 06/04/2023   HDL 46 (L) 06/04/2023   LDLCALC 38 06/04/2023   LDLDIRECT 48 12/11/2019   TRIG 126 06/04/2023   CHOLHDL 2.3 06/04/2023  Prev. on Pravachol  and she is not sure why she is not taking this anymore.  She was on Lipitor >> had mm cramps.   On fish oil.  Now also on Crestor  10.  She is also on Vascepa  1 g twice a day.   - eye exam: 2024: No DR reportedly, previously + DR + glaucoma, Dr Estelle.  She had cataract surgeries 03/2024.    -+ Numbness and tingling in her feet.  She was previously on Lyrica but had to stop due to somnolence.  She also had somnolence with the 300 mg Neurontin  tablets >> then 100 mg daily >> then stopped -now back on it >> 100 mg 2x a day..  She sees neurology (for  vertigo). She was seen by Dr. Silva for a diabetic foot ulcer 10/2022.  Since then, she developed cellulitis and osteomyelitis in left toes.  She had to have the left hallux amputated 07/31/2023 and the second left toe amputated 09/29/2023.   Last foot exam was 02/14/2024 by Dr. Gershon.   She also has a history of HTN, OSA, obesity, OA - on Osteobiflex, vitamin D deficiency.   She developed severe nausea and vertigo on 04/10/2018. She was on Reglan (per Dr. Aneita) that helped with the nausea >> now off >> she  was out of work because of this for a period of time.  She contacted our office and  I advised her to stop Victoza  but her vertigo did not improve so we restarted the dose up.  Of note, brain MRI was negative for a stroke. She had a stress test with Dr. Delford as she had CP with exertion >> positive >> had a heart cath >> had a stent placed.  She was restarted on a statin afterwards.   ROS: + See HPI   I reviewed pt's medications, allergies, PMH, social hx, family hx, and changes were documented in the history of present illness. Otherwise, unchanged from my initial visit note.   Past Medical History:  Diagnosis Date   Arthritis    CAD, NATIVE VESSEL    a. S/p LAD stent in 2006 b. S/p DES to Lcx 08/2019 and patent LAD stent   DM (diabetes mellitus) (HCC)    Fibromyalgia    HYPERLIPIDEMIA-MIXED    HYPERTENSION, BENIGN    Hypoglycemia 07/29/2023   Neuropathy    Obesity    Osteoarthritis    Sleep apnea    Patient reported on 04/12/13   Vertigo    Past Surgical History:  Procedure Laterality Date   AMPUTATION TOE Left 07/31/2023   Procedure: LEFT HALLUX AMPUTATION;  Surgeon: Gershon Donnice SAUNDERS, DPM;  Location: [facility];  Service: Orthopedics/Podiatry;  Laterality: Left;   AMPUTATION TOE Left 09/29/2023   Procedure: SECOND TOE AMPUTATION FIRST METATARSAL HEAD RESECTION;  Surgeon: Malvin Marsa FALCON, DPM;  Location: [facility];  Service: Orthopedics/Podiatry;  Laterality: Left;   BREATH TEK H PYLORI N/A 03/08/2013   Procedure: BREATH TEK H PYLORI;  Surgeon: Camellia CHRISTELLA Blush, MD;  Location: [facility];  Service: General;  Laterality: N/A;   CESAREAN SECTION     CHOLECYSTECTOMY  1996   CORONARY ANGIOPLASTY WITH STENT PLACEMENT  11/2004   DES - LAD   CORONARY STENT INTERVENTION N/A 09/06/2019   Procedure: CORONARY STENT INTERVENTION;  Surgeon: Wonda Sharper, MD;  Location: [facility] INVASIVE CV LAB;  Service: Cardiovascular;  Laterality: N/A;   CORONARY STENT PLACEMENT  11/2004   LOWER EXTREMITY  ANGIOGRAPHY Left 08/02/2023   Procedure: Lower Extremity Angiography;  Surgeon: Marea Selinda RAMAN, MD;  Location: [facility] INVASIVE CV LAB;  Service: Cardiovascular;  Laterality: Left;   RIGHT/LEFT HEART CATH AND CORONARY ANGIOGRAPHY N/A 09/06/2019   Procedure: RIGHT/LEFT HEART CATH  AND CORONARY ANGIOGRAPHY;  Surgeon: Wonda Sharper, MD;  Location: [facility] INVASIVE CV LAB;  Service: Cardiovascular;  Laterality: N/A;   Social History   Socioeconomic History   Marital status: Divorced    Spouse name: Not on file   Number of children: 2   Years of education: 12   Highest education level: High school graduate  Occupational History   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   Right-handed.   Caffeine use:  She will sometimes drink up to three 20-oz bottles of soda per day.  Drinks 0.5 bottles of a 5-hour energy drink daily.   Social Drivers of Corporate Investment Banker Strain: Low Risk  (10/15/2023)   Overall Financial Resource Strain (CARDIA)    Difficulty of Paying Living Expenses: Not very hard  Food Insecurity: Food Insecurity Present (10/15/2023)   Hunger Vital Sign    Worried About Running Out of Food in the Last Year: Sometimes true    Ran Out of Food in the Last Year: Never true  Transportation Needs: Unmet Transportation Needs (10/15/2023)   PRAPARE - Administrator, Civil Service (Medical): Yes    Lack of Transportation (Non-Medical): Yes  Physical Activity: Inactive (10/15/2023)   Exercise Vital Sign    Days of Exercise per Week: 0 days    Minutes of Exercise per Session: 0 min  Stress: No Stress Concern Present (10/15/2023)   [employer] of Occupational  Health - Occupational Stress Questionnaire    Feeling of Stress : Only a little  Social Connections: Socially Isolated (10/15/2023)   Social Connection and Isolation Panel    Frequency of Communication with Friends and Family: More than three times a week    Frequency of Social Gatherings with Friends and Family: Never    Attends Religious Services: Never    Database Administrator or Organizations: No    Attends Banker Meetings: Never    Marital Status: Divorced  Catering Manager Violence: Not At Risk (10/15/2023)   Humiliation, Afraid, Rape, and Kick questionnaire    Fear of Current or Ex-Partner: No    Emotionally Abused: No    Physically Abused: No    Sexually Abused: No   Current Outpatient Medications on File Prior to Visit  Medication Sig Dispense Refill   acetaminophen  (TYLENOL ) 650 MG CR tablet Take 650-1,300 mg by mouth every 8 (eight) hours as needed for pain.      aspirin  EC 81 MG tablet Take 81 mg by mouth daily.     benzonatate  (TESSALON ) 100 MG capsule Take 1 capsule (100 mg total) by mouth 3 (three) times daily as needed for cough. 30 capsule 0   beta carotene 25000 UNIT capsule Take 25,000 Units by mouth daily.       carvedilol  (COREG ) 3.125 MG tablet Take 1 tablet (3.125 mg total) by mouth 2 (two) times daily with a meal. 180 tablet 2   Cholecalciferol (VITAMIN D3) 250 MCG (10000 UT) capsule Take 10,000 Units by mouth 2 (two) times daily.     clopidogrel  (PLAVIX ) 75 MG tablet TAKE 1 TABLET BY MOUTH EVERY  DAY 90 tablet 1   Continuous Glucose Sensor (FREESTYLE LIBRE 3 SENSOR) MISC 1 each by Does not apply route every 14 (fourteen) days. 6 each 3   fluticasone  (FLONASE ) 50 MCG/ACT nasal spray Place 2 sprays into both nostrils daily. 16 g 0   gabapentin  (NEURONTIN ) 100 MG capsule TAKE 1 CAPSULE BY MOUTH TWICE A DAY 180 capsule 0   Homeopathic Products (THERAWORX RELIEF EX) Apply 1 application topically 3 (three) times daily as needed (pain.).     icosapent  Ethyl  (VASCEPA ) 1 g capsule Take 2 capsules (2 g total) by mouth 2 (two) times daily. 360 capsule 0   insulin  NPH Human (NOVOLIN  N) 100 UNIT/ML injection INJECT 8 UNITS SUBCUTANEOUSLY TWO TIMES A DAY (Patient taking differently: Inject into the skin. Inject 20 units in the morning anmd 16 - 18 units in the evening)     insulin  regular (NOVOLIN  R) 100 units/mL injection INJECT 5 units into the skin THREE TIMES daily (Patient taking differently: Inject into the skin. Injection 16 - 20  units 2 times per day)     Insulin  Syringe-Needle U-100 (INSULIN  SYRINGE .5CC/31GX5/16) 31G X 5/16 0.5 ML MISC Use to inject insulin  4 x a day 400 each 5   latanoprost  (XALATAN ) 0.005 % ophthalmic solution Apply to eye.     linezolid  (ZYVOX ) 600 MG tablet Take 1 tablet (600 mg total) by mouth 2 (two) times daily. 14 tablet 0   Magnesium Oxide -Mg Supplement ([employer] MAGNESIUM OXIDE) 250 MG TABS Take by mouth.     Menthol, Topical Analgesic, (ZIMS MAX-FREEZE EX) Apply 1 application topically 4 (four) times daily as needed (pain).     metFORMIN  (GLUCOPHAGE -XR) 500 MG 24 hr tablet TAKE 1 TABLET BY MOUTH 3 TIMES DAILY WITH MEALS 270 tablet 2   Misc Natural Products (OSTEO BI-FLEX JOINT SHIELD PO) Take 1 tablet by mouth 2 (two) times daily.     MYRBETRIQ  25 MG TB24 tablet Take 25 mg by mouth daily. 30 tablet 3   nitroGLYCERIN  (NITROSTAT ) 0.4 MG SL tablet Place 1 tablet (0.4 mg total) under the tongue daily as needed for chest pain. 25 tablet 3   ramipril  (ALTACE ) 10 MG capsule Take 1 capsule (10 mg total) by mouth daily. 90 capsule 1   rosuvastatin  (CRESTOR ) 10 MG tablet TAKE 1 TABLET DAILY 90 tablet 3   Semaglutide , 1 MG/DOSE, (OZEMPIC , 1 MG/DOSE,) 4 MG/3ML SOPN INJECT 1MG  SUBCUTANEOUSLY  ONCE WEEKLY (EVERY 7 DAYS) 9 mL 1   silver  sulfADIAZINE  (SILVADENE ) 1 % cream Apply 1 Application topically daily. 50 g 0   ULTRATRAK PRO TEST test strip TEST BLOOD SUGAR 5 TIMES DAILY AS INSTRUCTED 450 each 1   No current facility-administered  medications on file prior to visit.   Allergies  Allergen Reactions   Lyrica [Pregabalin] Other (See Comments)    sleepy   Tape Rash   Family History  Problem Relation Age of Onset   Heart disease Mother    Cancer Mother        ovarian   Arthritis Mother    Diabetes Mother    Hypertension Mother    Hypertension Father    Stroke Father    Diabetes Sister    Cancer Other    Coronary artery disease Other    Diabetes Other    PE: BP 122/80   Pulse 88   Ht 5' 6 (1.676 m)   Wt 261 lb 3.2 oz (118.5 kg)   SpO2 93%   BMI 42.16  kg/m  Wt Readings from Last 3 Encounters:  09/05/24 261 lb 3.2 oz (118.5 kg)  04/27/24 256 lb 6.4 oz (116.3 kg)  11/11/23 255 lb 9.6 oz (115.9 kg)    Constitutional: overweight, in NAD Eyes: no exophthalmos ENT: no masses palpated in neck, no cervical lymphadenopathy Cardiovascular: RRR, No MRG Respiratory: CTA B Musculoskeletal: no deformities other than 2 left toe amputations Skin: no rashes Neurological: no tremor with outstretched hands  ASSESSMENT: 1. DM2, insulin -dependent, uncontrolled, with complications - CAD - s/p stent 11/2004 - Dr Delford - peripheral neuropathy - DR She told me that her previous endocrinologist filled out FMLA forms 2/2 uncontrolled DM2, but I could not fill this since no major highs or lows or other issues.    2. HL  3.  PN - sees podiatry and neurology -She previously had somnolence with a higher dose of Neurontin , 300 mg daily, then decrease the dose to 100 mg twice a day -We previously discussed about possible side effects of Neurontin  and advised her to stay on the minimum dose possible   PLAN:  1. DM2 -Patient with longstanding, insulin -dependent type 2 diabetes, on a basal/bolus insulin  regimen along with weekly GLP-1 receptor agonist and metformin , with improved control after switching from Victoza  to Ozempic .  At last visit HbA1c was higher, at 8.9% after coming off the sensor, falling off the wagon with  her diet, and being off Ozempic  for cataract surgeries.  We discussed about increasing her insulin  with breakfast and at bedtime.  I also recommended a Lexcam patches to put on top of the sensor to keep it in place -At today's visit, reviewing the fungal media records, it appears that her sugars are quite high.  Upon questioning, she is missing the evening NPH insulin  due to taking the morning dose too late, says she is waking up around midday.  I did advise her to take it later in the evening.  We discussed about the importance of not missing it, especially since sugars in the morning are elevated.  Also, she reports drinking low-carb protein shakes but she is not bolusing for these.  We discussed that even these can raise her blood sugars and recommended to treat them as meals, taking at least 10 to 15 units of insulin  for these.  She agrees to try this.  Will continue the rest of the regimen. - I advised her to: Patient Instructions  Please continue: - Metformin  XR 500 mg 2x a day - Ozempic  1 mg weekly   Breakfast  Lunch   Dinner bedtime  Insulin  N: 26 units -  - 20  units  Insulin  R: 15-20 units  15-20 units  15-20 units    -  For sugars: 71-90: take 8 units of R insulin  50-70: do not take R insulin   Make sure you do not miss the N insulin .  Take the R insulin  before the protein shakes.  Please return in 4 months.   - we checked her HbA1c: 8.4% (slightly lower) - advised to check sugars at different times of the day - 4x a day, rotating check times - advised for yearly eye exams >> she is UTD - return to clinic in 4 months   2.HL - Latest lipid panel was reviewed from 2024: At goal except for slightly low HDL: Lab Results  Component Value Date   CHOL 105 06/04/2023   HDL 46 (L) 06/04/2023   LDLCALC 38 06/04/2023   LDLDIRECT 48 12/11/2019   TRIG 126  06/04/2023   CHOLHDL 2.3 06/04/2023  - On Crestor  10 mg daily and Vascepa  1 g twice a day without side effects.  She had muscle  aches with Lipitor.   Lela Fendt, MD PhD [facility]

## 2024-09-05 NOTE — Patient Instructions (Addendum)
 Please continue: - Metformin  XR 500 mg 2x a day - Ozempic  1 mg weekly   Breakfast  Lunch   Dinner bedtime  Insulin  N: 26 units -  - 20  units  Insulin  R: 15-20 units  15-20 units  15-20 units    -  For sugars: 71-90: take 8 units of R insulin  50-70: do not take R insulin   Make sure you do not miss the N insulin .  Take the R insulin  before the protein shakes.  Please return in 4 months.

## 2024-09-06 ENCOUNTER — Ambulatory Visit: Payer: Self-pay | Admitting: Internal Medicine

## 2024-09-11 ENCOUNTER — Other Ambulatory Visit: Payer: Self-pay | Admitting: Internal Medicine

## 2024-09-11 DIAGNOSIS — E1159 Type 2 diabetes mellitus with other circulatory complications: Secondary | ICD-10-CM

## 2024-09-21 ENCOUNTER — Ambulatory Visit: Admitting: Podiatry

## 2024-09-29 ENCOUNTER — Other Ambulatory Visit: Payer: Self-pay | Admitting: Cardiovascular Disease

## 2024-09-29 ENCOUNTER — Other Ambulatory Visit: Payer: Self-pay | Admitting: Internal Medicine

## 2024-09-29 DIAGNOSIS — E1159 Type 2 diabetes mellitus with other circulatory complications: Secondary | ICD-10-CM

## 2024-10-26 ENCOUNTER — Ambulatory Visit: Admitting: Podiatry

## 2024-11-09 ENCOUNTER — Other Ambulatory Visit: Payer: Self-pay | Admitting: Cardiovascular Disease

## 2024-11-09 ENCOUNTER — Other Ambulatory Visit: Payer: Self-pay | Admitting: Internal Medicine

## 2024-11-09 DIAGNOSIS — E1142 Type 2 diabetes mellitus with diabetic polyneuropathy: Secondary | ICD-10-CM

## 2024-11-10 NOTE — Telephone Encounter (Signed)
" °  Pt was rescheduled in June with Dr. Nishan due to provider schedule changed. Pt need her carvedilol  and clopidogrel  today because her neighbor is going to help her pick up her medications today.   "

## 2024-11-13 ENCOUNTER — Ambulatory Visit: Admitting: Podiatry

## 2024-11-15 ENCOUNTER — Ambulatory Visit: Admitting: Cardiovascular Disease

## 2025-01-04 ENCOUNTER — Ambulatory Visit: Admitting: Internal Medicine

## 2025-03-14 ENCOUNTER — Ambulatory Visit: Admitting: Cardiovascular Disease
